# Patient Record
Sex: Female | Born: 1940
Health system: Southern US, Community
[De-identification: ages and names within clinical notes are randomized; demographics above are authoritative.]

## PROBLEM LIST (undated history)

## (undated) DIAGNOSIS — E119 Type 2 diabetes mellitus without complications: Secondary | ICD-10-CM

## (undated) DIAGNOSIS — I10 Essential (primary) hypertension: Secondary | ICD-10-CM

## (undated) DIAGNOSIS — R32 Unspecified urinary incontinence: Secondary | ICD-10-CM

## (undated) DIAGNOSIS — D649 Anemia, unspecified: Secondary | ICD-10-CM

## (undated) DIAGNOSIS — L102 Pemphigus foliaceous: Secondary | ICD-10-CM

## (undated) DIAGNOSIS — E785 Hyperlipidemia, unspecified: Secondary | ICD-10-CM

## (undated) DIAGNOSIS — N289 Disorder of kidney and ureter, unspecified: Secondary | ICD-10-CM

## (undated) HISTORY — DX: Disorder of kidney and ureter, unspecified: N28.9

## (undated) HISTORY — DX: Hyperlipidemia, unspecified: E78.5

## (undated) HISTORY — DX: Unspecified urinary incontinence: R32

## (undated) HISTORY — DX: Pemphigus foliaceous: L10.2

## (undated) HISTORY — DX: Anemia, unspecified: D64.9

## (undated) HISTORY — DX: Essential (primary) hypertension: I10

## (undated) HISTORY — DX: Type 2 diabetes mellitus without complications: E11.9

---

## 2005-03-27 ENCOUNTER — Encounter: Admission: RE | Admit: 2005-03-27 | Discharge: 2005-03-27 | Payer: Self-pay | Admitting: Internal Medicine

## 2005-04-03 ENCOUNTER — Encounter: Admission: RE | Admit: 2005-04-03 | Discharge: 2005-07-02 | Payer: Self-pay | Admitting: Internal Medicine

## 2005-05-09 ENCOUNTER — Encounter: Admission: RE | Admit: 2005-05-09 | Discharge: 2005-05-09 | Payer: Self-pay | Admitting: Internal Medicine

## 2005-05-13 ENCOUNTER — Encounter: Admission: RE | Admit: 2005-05-13 | Discharge: 2005-05-13 | Payer: Self-pay | Admitting: Internal Medicine

## 2005-07-03 ENCOUNTER — Encounter: Admission: RE | Admit: 2005-07-03 | Discharge: 2005-07-03 | Payer: Self-pay | Admitting: Neurology

## 2006-07-30 ENCOUNTER — Other Ambulatory Visit: Admission: RE | Admit: 2006-07-30 | Discharge: 2006-07-30 | Payer: Self-pay | Admitting: Internal Medicine

## 2007-07-08 ENCOUNTER — Ambulatory Visit (HOSPITAL_COMMUNITY): Admission: RE | Admit: 2007-07-08 | Discharge: 2007-07-08 | Payer: Self-pay | Admitting: Internal Medicine

## 2007-07-08 ENCOUNTER — Ambulatory Visit: Payer: Self-pay | Admitting: Vascular Surgery

## 2007-08-13 ENCOUNTER — Other Ambulatory Visit: Admission: RE | Admit: 2007-08-13 | Discharge: 2007-08-13 | Payer: Self-pay | Admitting: Internal Medicine

## 2009-01-14 ENCOUNTER — Ambulatory Visit: Payer: Self-pay | Admitting: Internal Medicine

## 2009-02-09 ENCOUNTER — Ambulatory Visit: Payer: Self-pay | Admitting: Internal Medicine

## 2009-03-25 ENCOUNTER — Ambulatory Visit: Payer: Self-pay | Admitting: Internal Medicine

## 2009-04-02 ENCOUNTER — Ambulatory Visit: Payer: Self-pay | Admitting: Internal Medicine

## 2009-04-08 ENCOUNTER — Ambulatory Visit: Payer: Self-pay | Admitting: Internal Medicine

## 2009-04-27 ENCOUNTER — Ambulatory Visit: Payer: Self-pay | Admitting: Internal Medicine

## 2009-05-19 ENCOUNTER — Ambulatory Visit: Payer: Self-pay | Admitting: Internal Medicine

## 2009-05-25 ENCOUNTER — Ambulatory Visit: Payer: Self-pay | Admitting: Internal Medicine

## 2009-05-28 ENCOUNTER — Ambulatory Visit: Payer: Self-pay | Admitting: Endocrinology

## 2009-05-28 DIAGNOSIS — E7800 Pure hypercholesterolemia, unspecified: Secondary | ICD-10-CM | POA: Insufficient documentation

## 2009-05-28 DIAGNOSIS — E119 Type 2 diabetes mellitus without complications: Secondary | ICD-10-CM | POA: Insufficient documentation

## 2009-05-28 DIAGNOSIS — I10 Essential (primary) hypertension: Secondary | ICD-10-CM | POA: Insufficient documentation

## 2009-06-03 ENCOUNTER — Encounter: Payer: Self-pay | Admitting: Endocrinology

## 2009-06-10 ENCOUNTER — Ambulatory Visit: Payer: Self-pay | Admitting: Endocrinology

## 2009-06-25 ENCOUNTER — Ambulatory Visit: Payer: Self-pay | Admitting: Endocrinology

## 2009-06-25 DIAGNOSIS — L109 Pemphigus, unspecified: Secondary | ICD-10-CM | POA: Insufficient documentation

## 2009-06-25 DIAGNOSIS — I6789 Other cerebrovascular disease: Secondary | ICD-10-CM | POA: Insufficient documentation

## 2009-06-28 LAB — CONVERTED CEMR LAB: Fructosamine: 235 micromoles/L (ref <285)

## 2009-07-23 ENCOUNTER — Inpatient Hospital Stay (HOSPITAL_COMMUNITY): Admission: EM | Admit: 2009-07-23 | Discharge: 2009-07-29 | Payer: Self-pay | Admitting: Specialist

## 2009-08-10 ENCOUNTER — Inpatient Hospital Stay (HOSPITAL_COMMUNITY): Admission: EM | Admit: 2009-08-10 | Discharge: 2009-08-17 | Payer: Self-pay | Admitting: Emergency Medicine

## 2009-08-13 ENCOUNTER — Ambulatory Visit: Payer: Self-pay | Admitting: Internal Medicine

## 2009-08-16 ENCOUNTER — Encounter: Payer: Self-pay | Admitting: Gastroenterology

## 2009-08-16 ENCOUNTER — Encounter (INDEPENDENT_AMBULATORY_CARE_PROVIDER_SITE_OTHER): Payer: Self-pay | Admitting: Internal Medicine

## 2009-09-02 ENCOUNTER — Inpatient Hospital Stay (HOSPITAL_COMMUNITY): Admission: EM | Admit: 2009-09-02 | Discharge: 2009-09-14 | Payer: Self-pay | Admitting: Emergency Medicine

## 2009-09-08 ENCOUNTER — Encounter (INDEPENDENT_AMBULATORY_CARE_PROVIDER_SITE_OTHER): Payer: Self-pay | Admitting: Internal Medicine

## 2009-09-08 ENCOUNTER — Ambulatory Visit: Payer: Self-pay | Admitting: Cardiology

## 2009-11-18 ENCOUNTER — Ambulatory Visit: Payer: Self-pay | Admitting: Internal Medicine

## 2009-11-26 ENCOUNTER — Ambulatory Visit: Payer: Self-pay | Admitting: Internal Medicine

## 2009-12-06 ENCOUNTER — Ambulatory Visit: Payer: Self-pay | Admitting: Internal Medicine

## 2009-12-09 ENCOUNTER — Ambulatory Visit: Payer: Self-pay | Admitting: Internal Medicine

## 2010-01-03 ENCOUNTER — Ambulatory Visit: Payer: Self-pay | Admitting: Internal Medicine

## 2010-01-31 ENCOUNTER — Ambulatory Visit: Payer: Self-pay | Admitting: Internal Medicine

## 2010-02-11 ENCOUNTER — Ambulatory Visit: Payer: Self-pay | Admitting: Internal Medicine

## 2010-02-14 ENCOUNTER — Ambulatory Visit: Payer: Self-pay | Admitting: Internal Medicine

## 2010-02-22 ENCOUNTER — Ambulatory Visit: Payer: Self-pay | Admitting: Internal Medicine

## 2010-03-08 ENCOUNTER — Ambulatory Visit: Payer: Self-pay | Admitting: Internal Medicine

## 2010-03-14 ENCOUNTER — Encounter: Admission: RE | Admit: 2010-03-14 | Discharge: 2010-06-12 | Payer: Self-pay | Admitting: Internal Medicine

## 2010-04-01 ENCOUNTER — Ambulatory Visit: Payer: Self-pay | Admitting: Internal Medicine

## 2010-04-04 ENCOUNTER — Ambulatory Visit: Payer: Self-pay | Admitting: Internal Medicine

## 2010-04-15 ENCOUNTER — Ambulatory Visit: Payer: Self-pay | Admitting: Internal Medicine

## 2010-04-29 ENCOUNTER — Ambulatory Visit: Payer: Self-pay | Admitting: Internal Medicine

## 2010-05-02 ENCOUNTER — Ambulatory Visit: Payer: Self-pay | Admitting: Internal Medicine

## 2010-05-03 ENCOUNTER — Ambulatory Visit: Payer: Self-pay | Admitting: Internal Medicine

## 2010-05-20 ENCOUNTER — Ambulatory Visit: Payer: Self-pay | Admitting: Internal Medicine

## 2010-05-27 ENCOUNTER — Ambulatory Visit: Payer: Self-pay | Admitting: Internal Medicine

## 2010-06-01 ENCOUNTER — Ambulatory Visit: Payer: Self-pay | Admitting: Internal Medicine

## 2010-06-07 ENCOUNTER — Encounter (HOSPITAL_BASED_OUTPATIENT_CLINIC_OR_DEPARTMENT_OTHER)
Admission: RE | Admit: 2010-06-07 | Discharge: 2010-09-05 | Payer: Self-pay | Source: Home / Self Care | Attending: General Surgery | Admitting: General Surgery

## 2010-06-09 ENCOUNTER — Ambulatory Visit: Payer: Self-pay | Admitting: Internal Medicine

## 2010-06-13 ENCOUNTER — Encounter: Admission: RE | Admit: 2010-06-13 | Discharge: 2010-07-11 | Payer: Self-pay | Admitting: Internal Medicine

## 2010-06-14 ENCOUNTER — Ambulatory Visit: Payer: Self-pay | Admitting: Internal Medicine

## 2010-07-01 ENCOUNTER — Ambulatory Visit: Payer: Self-pay | Admitting: Vascular Surgery

## 2010-07-19 ENCOUNTER — Ambulatory Visit: Payer: Self-pay | Admitting: Internal Medicine

## 2010-09-05 ENCOUNTER — Encounter (HOSPITAL_BASED_OUTPATIENT_CLINIC_OR_DEPARTMENT_OTHER)
Admission: RE | Admit: 2010-09-05 | Discharge: 2010-10-04 | Payer: Self-pay | Source: Home / Self Care | Attending: General Surgery | Admitting: General Surgery

## 2010-09-15 ENCOUNTER — Ambulatory Visit: Payer: Self-pay | Admitting: Internal Medicine

## 2010-10-18 NOTE — Assessment & Plan Note (Signed)
Summary: 2 WK FU  #  STC--PER PT RS MOM/CONFLICT--STC   Vital Signs:  Patient profile:   70 year old female Height:      62 inches (157.48 cm) Weight:      258.50 pounds (117.50 kg) O2 Sat:      96 % on Room air Temp:     97.9 degrees F (36.61 degrees C) oral Pulse rate:   123 / minute BP sitting:   120 / 86  (left arm) Cuff size:   large  Vitals Entered By: Josph Macho CMA (June 25, 2009 10:04 AM)  O2 Flow:  Room air CC: 2 week follow up/ Pt states her doctor at [facility] changed her Prednisone to 2 1/2 tabs on Oct. 2, 2010/ CF   Referring Provider:  Dr Marlan Palau Primary Provider:  Dr Marlan Palau  CC:  2 week follow up/ Pt states her doctor at [facility] changed her Prednisone to 2 1/2 tabs on Oct. 2 and 2010/ CF.  History of Present Illness: last week, prednisone was reduced to 50 mg qam.  no cbg record, but states cbg's are  well-controlled, except it is highest at lunch.  pt states she feels well in general.   Current Medications (verified): 1)  Amlodipine Besylate 5 Mg Tabs (Amlodipine Besylate) .Marland Kitchen.. 1 Tab Qam 2)  Detrol La 4 Mg Xr24h-Cap (Tolterodine Tartrate) .Marland Kitchen.. 1 Cap Qam 3)  Furosemide 40 Mg Tabs (Furosemide) .Marland Kitchen.. 1 Tab Two Times A Day 4)  Aggrenox 25-200 Mg Xr12h-Cap (Aspirin-Dipyridamole) .Marland Kitchen.. 1 Cap Two Times A Day 5)  Aspirin 81 Mg Tbec (Aspirin) .Marland Kitchen.. 1qd 6)  Potassium Gluconate 595 Mg Tabs (Potassium Gluconate) .Marland Kitchen.. 1 Tab Tues,thurs and Sat 7)  Simvastatin 20 Mg Tabs (Simvastatin) .Marland Kitchen.. 1 Tab Daily 8)  Humalog Pen 100 Unit/ml Soln (Insulin Lispro (Human)) .... Qid (Qac) 20-30-15-5 Units 9)  Prednisone 20 Mg Tabs (Prednisone) .... 3 Tabs Qam 10)  Cellcept 500 Mg Tabs (Mycophenolate Mofetil) .... 4 Times Daily 11)  Vitamin D 2000 Unit Tabs (Cholecalciferol)  Allergies (verified): No Known Drug Allergies  Past History:  Past Medical History: OTHER URINARY INCONTINENCE (ICD-788.39) CEREBROVASCULAR ACCIDENT, ACUTE (ICD-436) PEMPHIGUS FOLIACEOUS  (ICD-694.4) HYPERTENSION (ICD-401.9) HYPERCHOLESTEROLEMIA (ICD-272.0) IDDM (ICD-250.01)  Review of Systems  The patient denies hypoglycemia.    Physical Exam  General:  obese.  no distress  Msk:  gait is slow but steady with a quad-cane Additional Exam:  fructosamine=235 (converts to a1c of 5.6)   Impression & Recommendations:  Problem # 1:  IDDM (ICD-250.01) overcontrolled  Medications Added to Medication List This Visit: 1)  Prednisone 20 Mg Tabs (Prednisone) .... 2 1/2  tabs qam  Other Orders: T-Fructosamine ([id number]) Est. Patient Level III (47425)  Patient Instructions: 1)  continue humalog to (just before each meal)  20-30-15-5 2)  return 2 months. 3)  tests are being ordered for you today.  a few days after the test(s), please call [phone] to hear your test results. 4)  update: take humalog only 20-25-10-5

## 2010-11-14 ENCOUNTER — Emergency Department (HOSPITAL_COMMUNITY): Payer: Medicare Other

## 2010-11-14 ENCOUNTER — Inpatient Hospital Stay (HOSPITAL_COMMUNITY)
Admission: EM | Admit: 2010-11-14 | Discharge: 2010-11-23 | DRG: 871 | Disposition: A | Discharge status: Other Acute Inpatient Hospital | Payer: Medicare Other | Attending: Internal Medicine | Admitting: Internal Medicine

## 2010-11-14 DIAGNOSIS — R6520 Severe sepsis without septic shock: Secondary | ICD-10-CM | POA: Diagnosis present

## 2010-11-14 DIAGNOSIS — E119 Type 2 diabetes mellitus without complications: Secondary | ICD-10-CM | POA: Diagnosis present

## 2010-11-14 DIAGNOSIS — Z8673 Personal history of transient ischemic attack (TIA), and cerebral infarction without residual deficits: Secondary | ICD-10-CM

## 2010-11-14 DIAGNOSIS — K219 Gastro-esophageal reflux disease without esophagitis: Secondary | ICD-10-CM | POA: Diagnosis present

## 2010-11-14 DIAGNOSIS — D631 Anemia in chronic kidney disease: Secondary | ICD-10-CM | POA: Diagnosis present

## 2010-11-14 DIAGNOSIS — R197 Diarrhea, unspecified: Secondary | ICD-10-CM | POA: Diagnosis present

## 2010-11-14 DIAGNOSIS — E876 Hypokalemia: Secondary | ICD-10-CM | POA: Diagnosis present

## 2010-11-14 DIAGNOSIS — R6521 Severe sepsis with septic shock: Secondary | ICD-10-CM | POA: Diagnosis present

## 2010-11-14 DIAGNOSIS — A419 Sepsis, unspecified organism: Principal | ICD-10-CM | POA: Diagnosis present

## 2010-11-14 DIAGNOSIS — L109 Pemphigus, unspecified: Secondary | ICD-10-CM | POA: Diagnosis present

## 2010-11-14 DIAGNOSIS — N179 Acute kidney failure, unspecified: Secondary | ICD-10-CM

## 2010-11-14 DIAGNOSIS — N390 Urinary tract infection, site not specified: Secondary | ICD-10-CM | POA: Diagnosis present

## 2010-11-14 DIAGNOSIS — R4182 Altered mental status, unspecified: Secondary | ICD-10-CM | POA: Diagnosis present

## 2010-11-14 DIAGNOSIS — I129 Hypertensive chronic kidney disease with stage 1 through stage 4 chronic kidney disease, or unspecified chronic kidney disease: Secondary | ICD-10-CM | POA: Diagnosis present

## 2010-11-14 DIAGNOSIS — N183 Chronic kidney disease, stage 3 unspecified: Secondary | ICD-10-CM | POA: Diagnosis present

## 2010-11-14 LAB — CBC
Hemoglobin: 13.8 g/dL (ref 12.0–15.0)
Platelets: 116 10*3/uL — ABNORMAL LOW (ref 150–400)
RBC: 4.47 MIL/uL (ref 3.87–5.11)
RDW: 14.3 % (ref 11.5–15.5)

## 2010-11-14 LAB — COMPREHENSIVE METABOLIC PANEL
ALT: 34 U/L (ref 0–35)
AST: 100 U/L — ABNORMAL HIGH (ref 0–37)
Albumin: 2 g/dL — ABNORMAL LOW (ref 3.5–5.2)
Alkaline Phosphatase: 53 U/L (ref 39–117)
CO2: 22 mEq/L (ref 19–32)
Calcium: 8 mg/dL — ABNORMAL LOW (ref 8.4–10.5)
Chloride: 100 mEq/L (ref 96–112)
Creatinine, Ser: 1.74 mg/dL — ABNORMAL HIGH (ref 0.4–1.2)
GFR calc Af Amer: 35 mL/min — ABNORMAL LOW (ref >60)
GFR calc non Af Amer: 29 mL/min — ABNORMAL LOW (ref >60)
Glucose, Bld: 235 mg/dL — ABNORMAL HIGH (ref 70–99)
Potassium: 3.7 mEq/L (ref 3.5–5.1)
Sodium: 133 mEq/L — ABNORMAL LOW (ref 135–145)
Total Bilirubin: 1.2 mg/dL (ref 0.3–1.2)
Total Protein: 4.9 g/dL — ABNORMAL LOW (ref 6.0–8.3)

## 2010-11-14 LAB — URINALYSIS, ROUTINE W REFLEX MICROSCOPIC
Nitrite: NEGATIVE
Protein, ur: 100 mg/dL — AB
Urobilinogen, UA: 1 mg/dL (ref 0.0–1.0)

## 2010-11-14 LAB — LACTIC ACID, PLASMA
Lactic Acid, Venous: 4.5 mmol/L — ABNORMAL HIGH (ref 0.5–2.2)
Lactic Acid, Venous: 3.1 mmol/L — ABNORMAL HIGH (ref 0.5–2.2)

## 2010-11-14 LAB — DIFFERENTIAL
Basophils Absolute: 0 10*3/uL (ref 0.0–0.1)
Basophils Relative: 0 % (ref 0–1)
Eosinophils Absolute: 0.1 10*3/uL (ref 0.0–0.7)
Eosinophils Relative: 1 % (ref 0–5)
Lymphocytes Relative: 2 % — ABNORMAL LOW (ref 12–46)
Lymphs Abs: 0.1 10*3/uL — ABNORMAL LOW (ref 0.7–4.0)
Monocytes Absolute: 0.1 10*3/uL (ref 0.1–1.0)
Monocytes Relative: 2 % — ABNORMAL LOW (ref 3–12)
Neutrophils Relative %: 95 % — ABNORMAL HIGH (ref 43–77)
WBC Morphology: INCREASED

## 2010-11-14 LAB — POCT CARDIAC MARKERS
CKMB, poc: <1 ng/mL — ABNORMAL LOW (ref 1.0–8.0)
Troponin i, poc: <0.05 ng/mL (ref 0.00–0.09)

## 2010-11-14 LAB — MRSA PCR SCREENING: MRSA by PCR: POSITIVE — AB

## 2010-11-14 LAB — URINE MICROSCOPIC-ADD ON

## 2010-11-14 LAB — GLUCOSE, CAPILLARY: Glucose-Capillary: 156 mg/dL — ABNORMAL HIGH (ref 70–99)

## 2010-11-15 ENCOUNTER — Inpatient Hospital Stay (HOSPITAL_COMMUNITY): Payer: Medicare Other

## 2010-11-15 DIAGNOSIS — L109 Pemphigus, unspecified: Secondary | ICD-10-CM

## 2010-11-15 DIAGNOSIS — R6521 Severe sepsis with septic shock: Secondary | ICD-10-CM

## 2010-11-15 DIAGNOSIS — A4901 Methicillin susceptible Staphylococcus aureus infection, unspecified site: Secondary | ICD-10-CM

## 2010-11-15 DIAGNOSIS — R7881 Bacteremia: Secondary | ICD-10-CM

## 2010-11-15 DIAGNOSIS — A419 Sepsis, unspecified organism: Secondary | ICD-10-CM

## 2010-11-15 DIAGNOSIS — N179 Acute kidney failure, unspecified: Secondary | ICD-10-CM

## 2010-11-15 LAB — GLUCOSE, CAPILLARY
Glucose-Capillary: 152 mg/dL — ABNORMAL HIGH (ref 70–99)
Glucose-Capillary: 183 mg/dL — ABNORMAL HIGH (ref 70–99)

## 2010-11-15 LAB — CBC
HCT: 34.8 % — ABNORMAL LOW (ref 36.0–46.0)
Hemoglobin: 11 g/dL — ABNORMAL LOW (ref 12.0–15.0)
MCH: 29.9 pg (ref 26.0–34.0)
MCV: 94.6 fL (ref 78.0–100.0)
RBC: 3.68 MIL/uL — ABNORMAL LOW (ref 3.87–5.11)

## 2010-11-15 LAB — BASIC METABOLIC PANEL
CO2: 18 mEq/L — ABNORMAL LOW (ref 19–32)
Chloride: 114 mEq/L — ABNORMAL HIGH (ref 96–112)
Creatinine, Ser: 1.62 mg/dL — ABNORMAL HIGH (ref 0.4–1.2)
GFR calc Af Amer: 38 mL/min — ABNORMAL LOW (ref >60)
Glucose, Bld: 180 mg/dL — ABNORMAL HIGH (ref 70–99)

## 2010-11-15 LAB — URINE CULTURE: Culture  Setup Time: 201202271526

## 2010-11-16 LAB — BASIC METABOLIC PANEL
BUN: 40 mg/dL — ABNORMAL HIGH (ref 6–23)
Calcium: 7.9 mg/dL — ABNORMAL LOW (ref 8.4–10.5)
Creatinine, Ser: 1.27 mg/dL — ABNORMAL HIGH (ref 0.4–1.2)
GFR calc non Af Amer: 42 mL/min — ABNORMAL LOW (ref >60)
Glucose, Bld: 237 mg/dL — ABNORMAL HIGH (ref 70–99)

## 2010-11-16 LAB — GLUCOSE, CAPILLARY
Glucose-Capillary: 221 mg/dL — ABNORMAL HIGH (ref 70–99)
Glucose-Capillary: 245 mg/dL — ABNORMAL HIGH (ref 70–99)

## 2010-11-16 LAB — CBC
HCT: 33.8 % — ABNORMAL LOW (ref 36.0–46.0)
MCHC: 32 g/dL (ref 30.0–36.0)
MCV: 93.4 fL (ref 78.0–100.0)
RDW: 14.6 % (ref 11.5–15.5)

## 2010-11-17 LAB — BASIC METABOLIC PANEL
BUN: 38 mg/dL — ABNORMAL HIGH (ref 6–23)
CO2: 19 mEq/L (ref 19–32)
Calcium: 8.3 mg/dL — ABNORMAL LOW (ref 8.4–10.5)
Chloride: 120 mEq/L — ABNORMAL HIGH (ref 96–112)
Creatinine, Ser: 1.2 mg/dL (ref 0.4–1.2)

## 2010-11-17 LAB — GLUCOSE, CAPILLARY
Glucose-Capillary: 262 mg/dL — ABNORMAL HIGH (ref 70–99)
Glucose-Capillary: 303 mg/dL — ABNORMAL HIGH (ref 70–99)

## 2010-11-17 LAB — CBC
MCH: 29.7 pg (ref 26.0–34.0)
MCHC: 31.8 g/dL (ref 30.0–36.0)
MCV: 93.6 fL (ref 78.0–100.0)
Platelets: 95 10*3/uL — ABNORMAL LOW (ref 150–400)
RBC: 3.43 MIL/uL — ABNORMAL LOW (ref 3.87–5.11)

## 2010-11-17 LAB — CULTURE, BLOOD (ROUTINE X 2): Culture  Setup Time: 201202271400

## 2010-11-18 LAB — CULTURE, BLOOD (ROUTINE X 2): Culture  Setup Time: 201202271400

## 2010-11-18 LAB — COMPREHENSIVE METABOLIC PANEL
Alkaline Phosphatase: 48 U/L (ref 39–117)
BUN: 35 mg/dL — ABNORMAL HIGH (ref 6–23)
Calcium: 8.5 mg/dL (ref 8.4–10.5)
Creatinine, Ser: 1.1 mg/dL (ref 0.4–1.2)
Glucose, Bld: 257 mg/dL — ABNORMAL HIGH (ref 70–99)
Total Protein: 4.2 g/dL — ABNORMAL LOW (ref 6.0–8.3)

## 2010-11-18 LAB — GLUCOSE, CAPILLARY
Glucose-Capillary: 208 mg/dL — ABNORMAL HIGH (ref 70–99)
Glucose-Capillary: 254 mg/dL — ABNORMAL HIGH (ref 70–99)
Glucose-Capillary: 261 mg/dL — ABNORMAL HIGH (ref 70–99)

## 2010-11-18 LAB — STOOL CULTURE

## 2010-11-18 LAB — CBC
MCH: 30.3 pg (ref 26.0–34.0)
MCV: 93.2 fL (ref 78.0–100.0)
Platelets: 118 10*3/uL — ABNORMAL LOW (ref 150–400)
RDW: 15.3 % (ref 11.5–15.5)

## 2010-11-19 LAB — BASIC METABOLIC PANEL
CO2: 20 mEq/L (ref 19–32)
Calcium: 8.3 mg/dL — ABNORMAL LOW (ref 8.4–10.5)
GFR calc Af Amer: >60 mL/min (ref >60)
GFR calc non Af Amer: 50 mL/min — ABNORMAL LOW (ref >60)
Sodium: 144 mEq/L (ref 135–145)

## 2010-11-19 LAB — GLUCOSE, CAPILLARY
Glucose-Capillary: 177 mg/dL — ABNORMAL HIGH (ref 70–99)
Glucose-Capillary: 240 mg/dL — ABNORMAL HIGH (ref 70–99)
Glucose-Capillary: 172 mg/dL — ABNORMAL HIGH (ref 70–99)

## 2010-11-19 LAB — CBC
Hemoglobin: 10.9 g/dL — ABNORMAL LOW (ref 12.0–15.0)
MCHC: 32.2 g/dL (ref 30.0–36.0)

## 2010-11-20 ENCOUNTER — Inpatient Hospital Stay (HOSPITAL_COMMUNITY): Payer: Medicare Other

## 2010-11-20 LAB — GLUCOSE, CAPILLARY
Glucose-Capillary: 101 mg/dL — ABNORMAL HIGH (ref 70–99)
Glucose-Capillary: 110 mg/dL — ABNORMAL HIGH (ref 70–99)
Glucose-Capillary: 218 mg/dL — ABNORMAL HIGH (ref 70–99)
Glucose-Capillary: 307 mg/dL — ABNORMAL HIGH (ref 70–99)

## 2010-11-21 DIAGNOSIS — L109 Pemphigus, unspecified: Secondary | ICD-10-CM

## 2010-11-21 DIAGNOSIS — R5381 Other malaise: Secondary | ICD-10-CM

## 2010-11-21 LAB — BASIC METABOLIC PANEL
Chloride: 114 mEq/L — ABNORMAL HIGH (ref 96–112)
GFR calc Af Amer: >60 mL/min (ref >60)
Potassium: 4.1 mEq/L (ref 3.5–5.1)
Sodium: 142 mEq/L (ref 135–145)

## 2010-11-21 LAB — GLUCOSE, CAPILLARY
Glucose-Capillary: 192 mg/dL — ABNORMAL HIGH (ref 70–99)
Glucose-Capillary: 201 mg/dL — ABNORMAL HIGH (ref 70–99)
Glucose-Capillary: 228 mg/dL — ABNORMAL HIGH (ref 70–99)

## 2010-11-22 DIAGNOSIS — A4901 Methicillin susceptible Staphylococcus aureus infection, unspecified site: Secondary | ICD-10-CM

## 2010-11-22 DIAGNOSIS — R7881 Bacteremia: Secondary | ICD-10-CM

## 2010-11-22 LAB — GLUCOSE, CAPILLARY
Glucose-Capillary: 133 mg/dL — ABNORMAL HIGH (ref 70–99)
Glucose-Capillary: 200 mg/dL — ABNORMAL HIGH (ref 70–99)
Glucose-Capillary: 220 mg/dL — ABNORMAL HIGH (ref 70–99)

## 2010-11-23 ENCOUNTER — Inpatient Hospital Stay
Admission: AD | Admit: 2010-11-23 | Discharge: 2010-12-15 | Disposition: A | Discharge status: Home Health | Payer: Self-pay | Source: Ambulatory Visit | Attending: Internal Medicine | Admitting: Internal Medicine

## 2010-11-23 LAB — CBC
MCV: 94.1 fL (ref 78.0–100.0)
Platelets: 263 10*3/uL (ref 150–400)
RDW: 15.6 % — ABNORMAL HIGH (ref 11.5–15.5)
WBC: 10.9 10*3/uL — ABNORMAL HIGH (ref 4.0–10.5)

## 2010-11-23 LAB — BASIC METABOLIC PANEL
BUN: 20 mg/dL (ref 6–23)
Creatinine, Ser: 0.78 mg/dL (ref 0.4–1.2)
GFR calc non Af Amer: >60 mL/min (ref >60)
Potassium: 4.7 mEq/L (ref 3.5–5.1)

## 2010-11-23 LAB — GLUCOSE, CAPILLARY
Glucose-Capillary: 100 mg/dL — ABNORMAL HIGH (ref 70–99)
Glucose-Capillary: 161 mg/dL — ABNORMAL HIGH (ref 70–99)
Glucose-Capillary: 231 mg/dL — ABNORMAL HIGH (ref 70–99)

## 2010-11-23 LAB — HEMOGLOBIN A1C: Mean Plasma Glucose: 214 mg/dL — ABNORMAL HIGH (ref <117)

## 2010-11-24 LAB — URINALYSIS, ROUTINE W REFLEX MICROSCOPIC
Bilirubin Urine: NEGATIVE
Glucose, UA: NEGATIVE mg/dL
Hgb urine dipstick: NEGATIVE
Protein, ur: NEGATIVE mg/dL
Specific Gravity, Urine: 1.023 (ref 1.005–1.030)
Urobilinogen, UA: 0.2 mg/dL (ref 0.0–1.0)

## 2010-11-24 LAB — COMPREHENSIVE METABOLIC PANEL
ALT: 17 U/L (ref 0–35)
Alkaline Phosphatase: 73 U/L (ref 39–117)
CO2: 24 mEq/L (ref 19–32)
GFR calc non Af Amer: >60 mL/min (ref >60)
Glucose, Bld: 166 mg/dL — ABNORMAL HIGH (ref 70–99)
Potassium: 4.1 mEq/L (ref 3.5–5.1)
Sodium: 137 mEq/L (ref 135–145)

## 2010-11-24 LAB — CBC
HCT: 32.6 % — ABNORMAL LOW (ref 36.0–46.0)
Hemoglobin: 10.5 g/dL — ABNORMAL LOW (ref 12.0–15.0)
RBC: 3.53 MIL/uL — ABNORMAL LOW (ref 3.87–5.11)
WBC: 11.5 10*3/uL — ABNORMAL HIGH (ref 4.0–10.5)

## 2010-11-24 LAB — MAGNESIUM: Magnesium: 1.8 mg/dL (ref 1.5–2.5)

## 2010-11-24 LAB — DIFFERENTIAL
Basophils Absolute: 0 10*3/uL (ref 0.0–0.1)
Lymphocytes Relative: 18 % (ref 12–46)
Neutro Abs: 8.2 10*3/uL — ABNORMAL HIGH (ref 1.7–7.7)

## 2010-11-24 LAB — TSH: TSH: 2.231 u[IU]/mL (ref 0.350–4.500)

## 2010-11-24 NOTE — Discharge Summary (Signed)
  NAME:  Julie Mora, Julie Mora               ACCOUNT NO.:  [id number]  MEDICAL RECORD NO.:  [id number]           PATIENT TYPE:  I  LOCATION:  1321                         FACILITY:  [facility]  PHYSICIAN:  Baltazar Najjar, MD     DATE OF BIRTH:  1941/07/11  DATE OF ADMISSION:  11/14/2010 DATE OF DISCHARGE:                              DISCHARGE SUMMARY   Addendum  Please refer to the dictated summary dated November 22, 2010 by Dr. Hillery Aldo.  ADDENDUM TO HOSPITAL COURSE:  The patient was seen and examined by me today and she is feeling better.  Denies any complaints.  Chart review and vital signs stable.  Blood pressure 135/87, pulse 79, temperature 97.7.  LABORATORY DATA:  On discharge, potassium 4.7, creatinine 0.7, hemoglobin 10.6, and WBCs 10.9.  DISCHARGE MEDICATIONS: 1. Florinef 0.1 mg tablets, take 2 tablets p.o. b.i.d. 2. Prednisone 40 mg p.o. daily. 3. TobraDex ophthalmic, both eyes, 1 application b.i.d. 4. Pradaxa 75 mg p.o. b.i.d. 5. Flora-Q intestinal flora capsules, 1 capsule p.o. daily. 6. Lomotil 1-2 tablets q.6h. p.r.n., maximum 60 mg per day. 7. Cefazolin 1 g IV q.8h. for 21 days to complete 28 days of therapy. 8. Avelox 400 mg IV q.24h. for 20 days to complete 28 days of therapy. 9. Lantus insulin 30 units subcu daily. 10.NovoLog insulin sliding scale, 1-15 units subcu t.i.d. with meals     and 2-5 units subcu q.h.s. 11.NovoLog insulin 9 units t.i.d. with meals. 12.Ferrous sulfate 325 mg 1 tablet p.o. b.i.d. 13.Silver sulfadiazine topical 1 application b.i.d. 14.Multivitamin 1 tablet p.o. daily. 15.Protonix 40 mg p.o. daily. 16.Vitamin C 500 mg 1 tablet p.o. 3 times a week. 17.Vitamin D2 1000 units p.o. 1 capsule p.o. daily.  MEDICATIONS ON HOLD: 1. Azathioprine 50 mg p.o. 2 tablets b.i.d. 2. Fluconazole 150 mg 1 tablet p.o. twice a week. 3. Lasix 80 mg p.o. daily. 4. Potassium chloride 10 mEq 5 tablets p.o. b.i.d.  DISCONTINUED MEDICATION:  Ibuprofen 200 mg 2  tablets p.o. q.8h. p.r.n.  FOLLOWUP: 1. The patient to follow with Dr. [name] in 1 week period and with Dr.     Janalyn Harder in 1 week period post discharge. 2. Immunosuppressive therapy is on hold as above.  Resumption of     therapy to be determined by her dermatologist.  She is currently on     her maintenance dose of steroid; however, her azathioprine is held. 3. The patient to report any worsening of symptoms or any new symptoms     to her PCP or to come back to the ED.  CONDITION ON DISCHARGE:  Stable.          ______________________________ Baltazar Najjar, MD     SA/MEDQ  D:  11/23/2010  T:  11/23/2010  Job:  [id number]  cc:   Luanna Cole. [name], M.D. Fax: [phone]  Ria Bush. [name], M.D. Fax: [phone]  Electronically Signed by Hannah Beat MD on 11/23/2010 09:41:18 PM

## 2010-11-25 LAB — URINE CULTURE: Culture  Setup Time: 201203081019

## 2010-11-28 LAB — BASIC METABOLIC PANEL
BUN: 20 mg/dL (ref 6–23)
Calcium: 8.1 mg/dL — ABNORMAL LOW (ref 8.4–10.5)
Creatinine, Ser: 0.85 mg/dL (ref 0.4–1.2)
GFR calc non Af Amer: >60 mL/min (ref >60)
Glucose, Bld: 107 mg/dL — ABNORMAL HIGH (ref 70–99)
Sodium: 143 mEq/L (ref 135–145)

## 2010-11-28 LAB — URINALYSIS, MICROSCOPIC ONLY
Bilirubin Urine: NEGATIVE
Glucose, UA: >1000 mg/dL — AB
Ketones, ur: NEGATIVE mg/dL
pH: 6 (ref 5.0–8.0)

## 2010-11-28 LAB — CBC
HCT: 33.8 % — ABNORMAL LOW (ref 36.0–46.0)
MCHC: 32 g/dL (ref 30.0–36.0)
MCV: 93.6 fL (ref 78.0–100.0)
RDW: 14.8 % (ref 11.5–15.5)

## 2010-11-29 LAB — BASIC METABOLIC PANEL
Calcium: 8.2 mg/dL — ABNORMAL LOW (ref 8.4–10.5)
GFR calc Af Amer: >60 mL/min (ref >60)
GFR calc non Af Amer: >60 mL/min (ref >60)
Glucose, Bld: 123 mg/dL — ABNORMAL HIGH (ref 70–99)
Sodium: 142 mEq/L (ref 135–145)

## 2010-11-30 LAB — BASIC METABOLIC PANEL
BUN: 19 mg/dL (ref 6–23)
Calcium: 8.5 mg/dL (ref 8.4–10.5)
GFR calc non Af Amer: >60 mL/min (ref >60)
Potassium: 3.4 mEq/L — ABNORMAL LOW (ref 3.5–5.1)
Sodium: 143 mEq/L (ref 135–145)

## 2010-11-30 NOTE — Progress Notes (Signed)
NAME:  Julie Mora, POLYAK               ACCOUNT NO.:  [id number]  MEDICAL RECORD NO.:  [id number]           PATIENT TYPE:  I  LOCATION:  1321                         FACILITY:  [facility]  PHYSICIAN:  Hillery Aldo, M.D.   DATE OF BIRTH:  21-Aug-1941                                PROGRESS NOTE   PRIMARY CARE PHYSICIAN: Luanna Cole. [name], M.D.  DERMATOLOGIST: Ria Bush. [name], M.D.  CURRENT DIAGNOSES: 1. Staphylococcus aureus and Proteus mirabilis bacteremia with sepsis. 2. Pemphigus foliaceus with acute flare. 3. Acute renal failure. 4. Stage II chronic kidney disease. 5. Hypokalemia. 6. Poorly controlled type 2 diabetes. 7. Diarrhea. 8. History of stroke. 9. Hypertension. 10.Gastroesophageal reflux disease. 11.Normocytic anemia. 12.Transient altered mental status. 13.Escherichia coli urinary tract infection.  DISCHARGE MEDICATIONS: Will be dictated at the time of actual discharge.  CONSULTATIONS: 1. Coralyn Helling, M.D., Pulmonology 2. Fransisco Hertz, M.D., Infectious Disease.  BRIEF ADMISSION HISTORY OF PRESENT ILLNESS: The patient is an unfortunate Julie Mora with past medical history of pemphigus foliaceus and skin breakdown from this disorder who was maintained on chronic immunosuppressive therapy.  She was brought to the hospital after developing fever accompanied by diarrhea and hypotension.  She was initially admitted by the pulmonary critical care specialist and her care subsequently transferred to the hospitalist when she was hemodynamically stable on November 16, 2010.  PROCEDURES AND DIAGNOSTIC STUDIES: 1. Chest x-ray on November 14, 2010, showed no acute cardiopulmonary     findings. 2. Chest x-ray on November 15, 2010, showed pulmonary vascular     congestion with no focal infiltrate or effusion. 3. Repeat chest x-ray on November 15, 2010, status post placement of     the PICC line showed the tip in the mid distal SVC approximately 4     cm above the  region of the caval atrial junction. 4. KUB on November 20, 2010, showed diffuse gaseous distention of large     and small bowel.  Possible mild ileus.  No substantial stool volume     in the colon or rectum to suggest clinical constipation or     impaction.  DISCHARGE LABORATORY VALUES: Will be dictated at the time of actual discharge.  HOSPITAL COURSE BY PROBLEM: 1. Sepsis secondary to staph aureus and Proteus mirabilis bacteremia:     The patient was admitted and fluid volume resuscitated.  She was     put on broad-spectrum antibiotics and antifungals due to her     history of chronic immunosuppression.  Her initial antibiotic     regimen consisted of vancomycin, Fortaz, Flagyl, and Diflucan.     Blood cultures, urine cultures, stool cultures, and PCR for C     difficile studies were sent.  She was maintained on IV fluids and     did not require pressor support.  On broad-spectrum antibiotic     therapy, the patient's clinical status improved dramatically.  She     was seen in consultation with infectious diseases and ultimately a     regimen of cefazolin and Avelox were recommended for a total     treatment course of 28 days.  The patient is now 7 days into     treatment and is doing well.  She is hemodynamically stable.  A     PICC line has been placed for prolonged antibiotic therapy. 2. Escherichia coli urinary tract infection:  The patient has been on     adequate treatment for this. 3. Pemphigus with acute flare:  The patient's azathioprine was placed     on held and she was initially put on stress-dose steroids.  Her     steroids have been tapered to her maintenance dose of prednisone 40     mg p.o. daily.  She should follow up with Dr. Janalyn Mora     postdischarge for further definitive treatment of her pemphigus. 4. Acute renal failure in the setting of stage II chronic kidney     disease:  The patient's creatinine was elevated due to her sepsis     syndrome.  Her  creatinine was 1.74 on initial presentation and is     now 0.88. 5. Hypokalemia:  The patient's potassium was repleted. 6. Type 2 diabetes:  The patient's type 2 diabetes is poorly     controlled.  We will monitor her CBG values closely and adjusted     her insulin as needed.  At this point, we will check hemoglobin A1c     to determine her overall glycemic control and continue to adjust     her insulin to achieve better control. 7. Diarrhea:  The patient's stool studies and C difficile toxin     studies are negative.  A KUB was done to rule out fecal impaction     and this was also negative.  We are now treating her     symptomatically with Lomotil as needed. 8. History of CVA:  The patient is maintained on Pradaxa for off label     use. 9. Hypertension:  The patient's blood pressure is currently well     controlled. 10.Gastroesophageal reflux disease:  The patient remains on proton     pump inhibitor therapy. 11.Anemia of chronic disease:  The patient does have a mild normocytic     anemia.  This is most likely due to chronic disease or acute     critical illness. 12.Altered mental status:  This was felt to be steroid induced or     possibly due to ICU psychosis.  The patient's mental status is back     to baseline.  DISPOSITION: The patient is awaiting bed availability at a long-term acute care facility for ongoing treatment.  A discharge summary addendum will be dictated when a bed is available.     Hillery Aldo, M.D.     CR/MEDQ  D:  11/22/2010  T:  11/23/2010  Job:  [id number]  cc:   Luanna Cole. [name], M.D. Fax: [phone]  Ria Bush. [name], M.D. Fax: [phone]  Electronically Signed by Hillery Aldo M.D. on 11/30/2010 04:58:08 PM

## 2010-12-01 LAB — BASIC METABOLIC PANEL
GFR calc Af Amer: >60 mL/min (ref >60)
GFR calc non Af Amer: >60 mL/min (ref >60)
Glucose, Bld: 84 mg/dL (ref 70–99)
Potassium: 3.4 mEq/L — ABNORMAL LOW (ref 3.5–5.1)
Sodium: 144 mEq/L (ref 135–145)

## 2010-12-01 LAB — CBC
HCT: 32.7 % — ABNORMAL LOW (ref 36.0–46.0)
RDW: 15.6 % — ABNORMAL HIGH (ref 11.5–15.5)
WBC: 7.4 10*3/uL (ref 4.0–10.5)

## 2010-12-02 LAB — BASIC METABOLIC PANEL
BUN: 17 mg/dL (ref 6–23)
Chloride: 110 mEq/L (ref 96–112)
GFR calc Af Amer: >60 mL/min (ref >60)
GFR calc non Af Amer: >60 mL/min (ref >60)
Potassium: 3.5 mEq/L (ref 3.5–5.1)
Sodium: 144 mEq/L (ref 135–145)

## 2010-12-03 LAB — BASIC METABOLIC PANEL
CO2: 31 mEq/L (ref 19–32)
Chloride: 106 mEq/L (ref 96–112)
GFR calc Af Amer: >60 mL/min (ref >60)
Potassium: 3.1 mEq/L — ABNORMAL LOW (ref 3.5–5.1)
Sodium: 141 mEq/L (ref 135–145)

## 2010-12-04 LAB — BASIC METABOLIC PANEL
CO2: 32 mEq/L (ref 19–32)
Chloride: 107 mEq/L (ref 96–112)
GFR calc Af Amer: >60 mL/min (ref >60)
Glucose, Bld: 115 mg/dL — ABNORMAL HIGH (ref 70–99)
Potassium: 3.1 mEq/L — ABNORMAL LOW (ref 3.5–5.1)
Sodium: 141 mEq/L (ref 135–145)

## 2010-12-05 LAB — CBC
HCT: 32 % — ABNORMAL LOW (ref 36.0–46.0)
Hemoglobin: 9.9 g/dL — ABNORMAL LOW (ref 12.0–15.0)
MCH: 29.6 pg (ref 26.0–34.0)
MCHC: 30.9 g/dL (ref 30.0–36.0)
RBC: 3.35 MIL/uL — ABNORMAL LOW (ref 3.87–5.11)

## 2010-12-05 LAB — BASIC METABOLIC PANEL
CO2: 30 mEq/L (ref 19–32)
Calcium: 8.3 mg/dL — ABNORMAL LOW (ref 8.4–10.5)
Chloride: 106 mEq/L (ref 96–112)
Creatinine, Ser: 0.79 mg/dL (ref 0.4–1.2)
GFR calc Af Amer: >60 mL/min (ref >60)
Glucose, Bld: 119 mg/dL — ABNORMAL HIGH (ref 70–99)

## 2010-12-06 LAB — BASIC METABOLIC PANEL
CO2: 30 mEq/L (ref 19–32)
Calcium: 8.5 mg/dL (ref 8.4–10.5)
Chloride: 105 mEq/L (ref 96–112)
GFR calc Af Amer: >60 mL/min (ref >60)
Glucose, Bld: 97 mg/dL (ref 70–99)
Sodium: 141 mEq/L (ref 135–145)

## 2010-12-07 LAB — CBC
Hemoglobin: 10 g/dL — ABNORMAL LOW (ref 12.0–15.0)
MCH: 31 pg (ref 26.0–34.0)
MCHC: 31.7 g/dL (ref 30.0–36.0)
MCV: 97.5 fL (ref 78.0–100.0)
RBC: 3.23 MIL/uL — ABNORMAL LOW (ref 3.87–5.11)

## 2010-12-07 LAB — BASIC METABOLIC PANEL
BUN: 17 mg/dL (ref 6–23)
CO2: 31 mEq/L (ref 19–32)
Calcium: 8.4 mg/dL (ref 8.4–10.5)
Chloride: 105 mEq/L (ref 96–112)
Creatinine, Ser: 1.04 mg/dL (ref 0.4–1.2)
Glucose, Bld: 121 mg/dL — ABNORMAL HIGH (ref 70–99)

## 2010-12-08 LAB — BASIC METABOLIC PANEL
BUN: 18 mg/dL (ref 6–23)
Calcium: 8.1 mg/dL — ABNORMAL LOW (ref 8.4–10.5)
GFR calc non Af Amer: >60 mL/min (ref >60)
Glucose, Bld: 111 mg/dL — ABNORMAL HIGH (ref 70–99)

## 2010-12-09 LAB — CBC
HCT: 29.7 % — ABNORMAL LOW (ref 36.0–46.0)
MCH: 30.7 pg (ref 26.0–34.0)
MCHC: 31.3 g/dL (ref 30.0–36.0)
RDW: 16.2 % — ABNORMAL HIGH (ref 11.5–15.5)

## 2010-12-09 LAB — BASIC METABOLIC PANEL
Calcium: 8.3 mg/dL — ABNORMAL LOW (ref 8.4–10.5)
Creatinine, Ser: 0.9 mg/dL (ref 0.4–1.2)
GFR calc Af Amer: >60 mL/min (ref >60)
GFR calc non Af Amer: >60 mL/min (ref >60)
Glucose, Bld: 105 mg/dL — ABNORMAL HIGH (ref 70–99)
Sodium: 141 mEq/L (ref 135–145)

## 2010-12-10 LAB — BASIC METABOLIC PANEL
BUN: 14 mg/dL (ref 6–23)
Calcium: 8.3 mg/dL — ABNORMAL LOW (ref 8.4–10.5)
GFR calc non Af Amer: >60 mL/min (ref >60)
Potassium: 3.3 mEq/L — ABNORMAL LOW (ref 3.5–5.1)

## 2010-12-11 LAB — URINALYSIS, ROUTINE W REFLEX MICROSCOPIC
Bilirubin Urine: NEGATIVE
Ketones, ur: NEGATIVE mg/dL
Nitrite: POSITIVE — AB
Protein, ur: 30 mg/dL — AB
Urobilinogen, UA: 0.2 mg/dL (ref 0.0–1.0)
pH: 8.5 — ABNORMAL HIGH (ref 5.0–8.0)

## 2010-12-11 LAB — URINE MICROSCOPIC-ADD ON

## 2010-12-12 LAB — POTASSIUM: Potassium: 3.2 mEq/L — ABNORMAL LOW (ref 3.5–5.1)

## 2010-12-12 LAB — PREALBUMIN: Prealbumin: 18 mg/dL (ref 17.0–34.0)

## 2010-12-13 LAB — BASIC METABOLIC PANEL
BUN: 13 mg/dL (ref 6–23)
CO2: 28 mEq/L (ref 19–32)
Chloride: 110 mEq/L (ref 96–112)
Glucose, Bld: 87 mg/dL (ref 70–99)
Potassium: 3.1 mEq/L — ABNORMAL LOW (ref 3.5–5.1)
Sodium: 143 mEq/L (ref 135–145)

## 2010-12-14 LAB — BASIC METABOLIC PANEL
BUN: 13 mg/dL (ref 6–23)
CO2: 29 mEq/L (ref 19–32)
Chloride: 109 mEq/L (ref 96–112)
Creatinine, Ser: 0.9 mg/dL (ref 0.4–1.2)
GFR calc Af Amer: >60 mL/min (ref >60)
Potassium: 3.5 mEq/L (ref 3.5–5.1)

## 2010-12-14 LAB — CBC
Hemoglobin: 9.4 g/dL — ABNORMAL LOW (ref 12.0–15.0)
MCH: 31.1 pg (ref 26.0–34.0)
MCV: 97 fL (ref 78.0–100.0)
Platelets: 262 10*3/uL (ref 150–400)
RBC: 3.02 MIL/uL — ABNORMAL LOW (ref 3.87–5.11)
WBC: 10 10*3/uL (ref 4.0–10.5)

## 2010-12-15 ENCOUNTER — Ambulatory Visit: Payer: Self-pay | Admitting: Internal Medicine

## 2010-12-15 ENCOUNTER — Ambulatory Visit: Payer: Medicare Other | Admitting: Internal Medicine

## 2010-12-15 LAB — BASIC METABOLIC PANEL
CO2: 28 mEq/L (ref 19–32)
Calcium: 8.5 mg/dL (ref 8.4–10.5)
Chloride: 106 mEq/L (ref 96–112)
GFR calc Af Amer: >60 mL/min (ref >60)
Glucose, Bld: 82 mg/dL (ref 70–99)
Sodium: 141 mEq/L (ref 135–145)

## 2010-12-16 ENCOUNTER — Telehealth: Payer: Self-pay | Admitting: Endocrinology

## 2010-12-16 LAB — URINE CULTURE: Culture  Setup Time: 201203252145

## 2010-12-16 NOTE — Telephone Encounter (Signed)
Message copied by Brenton Grills on Fri Dec 16, 2010  4:31 PM ------      Message from: Romero Belling      Created: Fri Dec 16, 2010 10:41 AM       please call patient:      Ov is due.  Last seen here 2010

## 2010-12-16 NOTE — Telephone Encounter (Signed)
Per MD, pt is due for F/U OV. Last OV 2010. Left message for pt to callback office. Karrie Doffing, CMA 4:34pm 12/16/2010

## 2010-12-19 LAB — COMPREHENSIVE METABOLIC PANEL
ALT: 11 U/L (ref 0–35)
ALT: 12 U/L (ref 0–35)
ALT: 12 U/L (ref 0–35)
ALT: 13 U/L (ref 0–35)
ALT: 14 U/L (ref 0–35)
ALT: 14 U/L (ref 0–35)
ALT: 15 U/L (ref 0–35)
ALT: 15 U/L (ref 0–35)
ALT: 16 U/L (ref 0–35)
AST: 14 U/L (ref 0–37)
AST: 18 U/L (ref 0–37)
AST: 19 U/L (ref 0–37)
AST: 21 U/L (ref 0–37)
AST: 21 U/L (ref 0–37)
AST: 21 U/L (ref 0–37)
Albumin: 1.6 g/dL — ABNORMAL LOW (ref 3.5–5.2)
Albumin: 1.7 g/dL — ABNORMAL LOW (ref 3.5–5.2)
Albumin: 2 g/dL — ABNORMAL LOW (ref 3.5–5.2)
Albumin: 2.2 g/dL — ABNORMAL LOW (ref 3.5–5.2)
Albumin: 2.5 g/dL — ABNORMAL LOW (ref 3.5–5.2)
Alkaline Phosphatase: 69 U/L (ref 39–117)
Alkaline Phosphatase: 82 U/L (ref 39–117)
Alkaline Phosphatase: 83 U/L (ref 39–117)
BUN: 34 mg/dL — ABNORMAL HIGH (ref 6–23)
BUN: 38 mg/dL — ABNORMAL HIGH (ref 6–23)
BUN: 39 mg/dL — ABNORMAL HIGH (ref 6–23)
BUN: 43 mg/dL — ABNORMAL HIGH (ref 6–23)
BUN: 44 mg/dL — ABNORMAL HIGH (ref 6–23)
CO2: 11 mEq/L — ABNORMAL LOW (ref 19–32)
CO2: 15 mEq/L — ABNORMAL LOW (ref 19–32)
CO2: 17 mEq/L — ABNORMAL LOW (ref 19–32)
CO2: 17 mEq/L — ABNORMAL LOW (ref 19–32)
CO2: 18 mEq/L — ABNORMAL LOW (ref 19–32)
CO2: 21 mEq/L (ref 19–32)
CO2: 22 mEq/L (ref 19–32)
CO2: 25 mEq/L (ref 19–32)
Calcium: 8 mg/dL — ABNORMAL LOW (ref 8.4–10.5)
Calcium: 8.2 mg/dL — ABNORMAL LOW (ref 8.4–10.5)
Calcium: 8.4 mg/dL (ref 8.4–10.5)
Calcium: 8.4 mg/dL (ref 8.4–10.5)
Calcium: 8.5 mg/dL (ref 8.4–10.5)
Calcium: 8.6 mg/dL (ref 8.4–10.5)
Calcium: 8.8 mg/dL (ref 8.4–10.5)
Calcium: 8.8 mg/dL (ref 8.4–10.5)
Chloride: 109 mEq/L (ref 96–112)
Chloride: 110 mEq/L (ref 96–112)
Chloride: 112 mEq/L (ref 96–112)
Chloride: 113 mEq/L — ABNORMAL HIGH (ref 96–112)
Chloride: 119 mEq/L — ABNORMAL HIGH (ref 96–112)
Creatinine, Ser: 1.53 mg/dL — ABNORMAL HIGH (ref 0.4–1.2)
Creatinine, Ser: 1.54 mg/dL — ABNORMAL HIGH (ref 0.4–1.2)
Creatinine, Ser: 1.59 mg/dL — ABNORMAL HIGH (ref 0.4–1.2)
Creatinine, Ser: 1.66 mg/dL — ABNORMAL HIGH (ref 0.4–1.2)
Creatinine, Ser: 1.83 mg/dL — ABNORMAL HIGH (ref 0.4–1.2)
Creatinine, Ser: 1.84 mg/dL — ABNORMAL HIGH (ref 0.4–1.2)
GFR calc Af Amer: 34 mL/min — ABNORMAL LOW (ref >60)
GFR calc Af Amer: 34 mL/min — ABNORMAL LOW (ref >60)
GFR calc Af Amer: 35 mL/min — ABNORMAL LOW (ref >60)
GFR calc Af Amer: 37 mL/min — ABNORMAL LOW (ref >60)
GFR calc Af Amer: 39 mL/min — ABNORMAL LOW (ref >60)
GFR calc Af Amer: 41 mL/min — ABNORMAL LOW (ref >60)
GFR calc non Af Amer: 27 mL/min — ABNORMAL LOW (ref >60)
GFR calc non Af Amer: 28 mL/min — ABNORMAL LOW (ref >60)
GFR calc non Af Amer: 28 mL/min — ABNORMAL LOW (ref >60)
GFR calc non Af Amer: 31 mL/min — ABNORMAL LOW (ref >60)
GFR calc non Af Amer: 32 mL/min — ABNORMAL LOW (ref >60)
GFR calc non Af Amer: 34 mL/min — ABNORMAL LOW (ref >60)
GFR calc non Af Amer: 34 mL/min — ABNORMAL LOW (ref >60)
Glucose, Bld: 108 mg/dL — ABNORMAL HIGH (ref 70–99)
Glucose, Bld: 132 mg/dL — ABNORMAL HIGH (ref 70–99)
Glucose, Bld: 137 mg/dL — ABNORMAL HIGH (ref 70–99)
Glucose, Bld: 153 mg/dL — ABNORMAL HIGH (ref 70–99)
Glucose, Bld: 204 mg/dL — ABNORMAL HIGH (ref 70–99)
Glucose, Bld: 214 mg/dL — ABNORMAL HIGH (ref 70–99)
Glucose, Bld: 242 mg/dL — ABNORMAL HIGH (ref 70–99)
Potassium: 2.8 mEq/L — ABNORMAL LOW (ref 3.5–5.1)
Potassium: 4.1 mEq/L (ref 3.5–5.1)
Potassium: 4.4 mEq/L (ref 3.5–5.1)
Sodium: 131 mEq/L — ABNORMAL LOW (ref 135–145)
Sodium: 134 mEq/L — ABNORMAL LOW (ref 135–145)
Sodium: 137 mEq/L (ref 135–145)
Sodium: 137 mEq/L (ref 135–145)
Sodium: 139 mEq/L (ref 135–145)
Sodium: 140 mEq/L (ref 135–145)
Sodium: 142 mEq/L (ref 135–145)
Total Bilirubin: 0.5 mg/dL (ref 0.3–1.2)
Total Bilirubin: 0.6 mg/dL (ref 0.3–1.2)
Total Bilirubin: 0.6 mg/dL (ref 0.3–1.2)
Total Bilirubin: 0.6 mg/dL (ref 0.3–1.2)
Total Bilirubin: 0.7 mg/dL (ref 0.3–1.2)
Total Protein: 3.5 g/dL — ABNORMAL LOW (ref 6.0–8.3)
Total Protein: 3.6 g/dL — ABNORMAL LOW (ref 6.0–8.3)
Total Protein: 3.8 g/dL — ABNORMAL LOW (ref 6.0–8.3)
Total Protein: 4 g/dL — ABNORMAL LOW (ref 6.0–8.3)
Total Protein: 4.4 g/dL — ABNORMAL LOW (ref 6.0–8.3)
Total Protein: 4.4 g/dL — ABNORMAL LOW (ref 6.0–8.3)

## 2010-12-19 LAB — GLUCOSE, CAPILLARY
Glucose-Capillary: 122 mg/dL — ABNORMAL HIGH (ref 70–99)
Glucose-Capillary: 129 mg/dL — ABNORMAL HIGH (ref 70–99)
Glucose-Capillary: 130 mg/dL — ABNORMAL HIGH (ref 70–99)
Glucose-Capillary: 138 mg/dL — ABNORMAL HIGH (ref 70–99)
Glucose-Capillary: 149 mg/dL — ABNORMAL HIGH (ref 70–99)
Glucose-Capillary: 153 mg/dL — ABNORMAL HIGH (ref 70–99)
Glucose-Capillary: 154 mg/dL — ABNORMAL HIGH (ref 70–99)
Glucose-Capillary: 174 mg/dL — ABNORMAL HIGH (ref 70–99)
Glucose-Capillary: 180 mg/dL — ABNORMAL HIGH (ref 70–99)
Glucose-Capillary: 183 mg/dL — ABNORMAL HIGH (ref 70–99)
Glucose-Capillary: 184 mg/dL — ABNORMAL HIGH (ref 70–99)
Glucose-Capillary: 185 mg/dL — ABNORMAL HIGH (ref 70–99)
Glucose-Capillary: 191 mg/dL — ABNORMAL HIGH (ref 70–99)
Glucose-Capillary: 193 mg/dL — ABNORMAL HIGH (ref 70–99)
Glucose-Capillary: 204 mg/dL — ABNORMAL HIGH (ref 70–99)
Glucose-Capillary: 207 mg/dL — ABNORMAL HIGH (ref 70–99)
Glucose-Capillary: 223 mg/dL — ABNORMAL HIGH (ref 70–99)
Glucose-Capillary: 224 mg/dL — ABNORMAL HIGH (ref 70–99)
Glucose-Capillary: 242 mg/dL — ABNORMAL HIGH (ref 70–99)
Glucose-Capillary: 259 mg/dL — ABNORMAL HIGH (ref 70–99)
Glucose-Capillary: 263 mg/dL — ABNORMAL HIGH (ref 70–99)
Glucose-Capillary: 376 mg/dL — ABNORMAL HIGH (ref 70–99)
Glucose-Capillary: 85 mg/dL (ref 70–99)

## 2010-12-19 LAB — CBC
HCT: 28 % — ABNORMAL LOW (ref 36.0–46.0)
HCT: 30.2 % — ABNORMAL LOW (ref 36.0–46.0)
HCT: 31.1 % — ABNORMAL LOW (ref 36.0–46.0)
HCT: 32 % — ABNORMAL LOW (ref 36.0–46.0)
HCT: 32.2 % — ABNORMAL LOW (ref 36.0–46.0)
HCT: 33.7 % — ABNORMAL LOW (ref 36.0–46.0)
HCT: 34.1 % — ABNORMAL LOW (ref 36.0–46.0)
HCT: 36.3 % (ref 36.0–46.0)
HCT: 38.6 % (ref 36.0–46.0)
Hemoglobin: 10.7 g/dL — ABNORMAL LOW (ref 12.0–15.0)
Hemoglobin: 11.1 g/dL — ABNORMAL LOW (ref 12.0–15.0)
Hemoglobin: 11.4 g/dL — ABNORMAL LOW (ref 12.0–15.0)
Hemoglobin: 12.1 g/dL (ref 12.0–15.0)
Hemoglobin: 12.7 g/dL (ref 12.0–15.0)
Hemoglobin: 9.4 g/dL — ABNORMAL LOW (ref 12.0–15.0)
Hemoglobin: 9.6 g/dL — ABNORMAL LOW (ref 12.0–15.0)
MCHC: 32.5 g/dL (ref 30.0–36.0)
MCHC: 32.8 g/dL (ref 30.0–36.0)
MCHC: 32.9 g/dL (ref 30.0–36.0)
MCHC: 33.3 g/dL (ref 30.0–36.0)
MCHC: 33.3 g/dL (ref 30.0–36.0)
MCHC: 33.4 g/dL (ref 30.0–36.0)
MCHC: 33.4 g/dL (ref 30.0–36.0)
MCHC: 33.6 g/dL (ref 30.0–36.0)
MCV: 100 fL (ref 78.0–100.0)
MCV: 101.4 fL — ABNORMAL HIGH (ref 78.0–100.0)
MCV: 98.4 fL (ref 78.0–100.0)
MCV: 99.4 fL (ref 78.0–100.0)
MCV: 99.5 fL (ref 78.0–100.0)
MCV: 99.7 fL (ref 78.0–100.0)
MCV: 99.7 fL (ref 78.0–100.0)
MCV: 99.8 fL (ref 78.0–100.0)
Platelets: 139 10*3/uL — ABNORMAL LOW (ref 150–400)
Platelets: 146 10*3/uL — ABNORMAL LOW (ref 150–400)
Platelets: 152 10*3/uL (ref 150–400)
Platelets: 156 10*3/uL (ref 150–400)
Platelets: 161 10*3/uL (ref 150–400)
Platelets: 212 10*3/uL (ref 150–400)
Platelets: 221 10*3/uL (ref 150–400)
Platelets: 223 10*3/uL (ref 150–400)
Platelets: 225 10*3/uL (ref 150–400)
RBC: 2.83 MIL/uL — ABNORMAL LOW (ref 3.87–5.11)
RBC: 3.38 MIL/uL — ABNORMAL LOW (ref 3.87–5.11)
RBC: 3.43 MIL/uL — ABNORMAL LOW (ref 3.87–5.11)
RBC: 3.63 MIL/uL — ABNORMAL LOW (ref 3.87–5.11)
RDW: 16.8 % — ABNORMAL HIGH (ref 11.5–15.5)
RDW: 17.4 % — ABNORMAL HIGH (ref 11.5–15.5)
RDW: 17.5 % — ABNORMAL HIGH (ref 11.5–15.5)
RDW: 17.5 % — ABNORMAL HIGH (ref 11.5–15.5)
RDW: 17.6 % — ABNORMAL HIGH (ref 11.5–15.5)
RDW: 17.6 % — ABNORMAL HIGH (ref 11.5–15.5)
RDW: 17.8 % — ABNORMAL HIGH (ref 11.5–15.5)
RDW: 18.8 % — ABNORMAL HIGH (ref 11.5–15.5)
WBC: 16.1 10*3/uL — ABNORMAL HIGH (ref 4.0–10.5)
WBC: 18.5 10*3/uL — ABNORMAL HIGH (ref 4.0–10.5)
WBC: 18.8 10*3/uL — ABNORMAL HIGH (ref 4.0–10.5)
WBC: 21 10*3/uL — ABNORMAL HIGH (ref 4.0–10.5)
WBC: 23.7 10*3/uL — ABNORMAL HIGH (ref 4.0–10.5)

## 2010-12-19 LAB — LIPASE, BLOOD: Lipase: 15 U/L (ref 11–59)

## 2010-12-19 LAB — POCT CARDIAC MARKERS

## 2010-12-19 LAB — CULTURE, BLOOD (ROUTINE X 2)
Culture: NO GROWTH
Culture: NO GROWTH

## 2010-12-19 LAB — URINE CULTURE: Colony Count: NO GROWTH

## 2010-12-19 LAB — PROTIME-INR
INR: 2.94 — ABNORMAL HIGH (ref 0.00–1.49)
INR: 3.35 — ABNORMAL HIGH (ref 0.00–1.49)
INR: 3.54 — ABNORMAL HIGH (ref 0.00–1.49)
INR: 3.61 — ABNORMAL HIGH (ref 0.00–1.49)
Prothrombin Time: 26.6 seconds — ABNORMAL HIGH (ref 11.6–15.2)
Prothrombin Time: 26.8 seconds — ABNORMAL HIGH (ref 11.6–15.2)
Prothrombin Time: 27.8 seconds — ABNORMAL HIGH (ref 11.6–15.2)
Prothrombin Time: 33.3 seconds — ABNORMAL HIGH (ref 11.6–15.2)
Prothrombin Time: 33.7 seconds — ABNORMAL HIGH (ref 11.6–15.2)
Prothrombin Time: 34.2 seconds — ABNORMAL HIGH (ref 11.6–15.2)
Prothrombin Time: 35.2 seconds — ABNORMAL HIGH (ref 11.6–15.2)
Prothrombin Time: 35.7 seconds — ABNORMAL HIGH (ref 11.6–15.2)

## 2010-12-19 LAB — URINE MICROSCOPIC-ADD ON

## 2010-12-19 LAB — BASIC METABOLIC PANEL
BUN: 41 mg/dL — ABNORMAL HIGH (ref 6–23)
BUN: 44 mg/dL — ABNORMAL HIGH (ref 6–23)
CO2: 14 mEq/L — ABNORMAL LOW (ref 19–32)
CO2: 20 mEq/L (ref 19–32)
Calcium: 8.1 mg/dL — ABNORMAL LOW (ref 8.4–10.5)
Glucose, Bld: 103 mg/dL — ABNORMAL HIGH (ref 70–99)
Glucose, Bld: 287 mg/dL — ABNORMAL HIGH (ref 70–99)
Potassium: 4 mEq/L (ref 3.5–5.1)
Potassium: 4.6 mEq/L (ref 3.5–5.1)
Sodium: 133 mEq/L — ABNORMAL LOW (ref 135–145)
Sodium: 136 mEq/L (ref 135–145)

## 2010-12-19 LAB — HEPATIC FUNCTION PANEL
Albumin: 1.7 g/dL — ABNORMAL LOW (ref 3.5–5.2)
Alkaline Phosphatase: 71 U/L (ref 39–117)
Bilirubin, Direct: 0.2 mg/dL (ref 0.0–0.3)
Indirect Bilirubin: 0.3 mg/dL (ref 0.3–0.9)
Total Bilirubin: 0.5 mg/dL (ref 0.3–1.2)

## 2010-12-19 LAB — URINALYSIS, ROUTINE W REFLEX MICROSCOPIC
Ketones, ur: 15 mg/dL — AB
Nitrite: POSITIVE — AB
Protein, ur: NEGATIVE mg/dL
pH: 5 (ref 5.0–8.0)

## 2010-12-19 LAB — DIFFERENTIAL
Basophils Relative: 0 % (ref 0–1)
Eosinophils Absolute: 0.2 10*3/uL (ref 0.0–0.7)
Eosinophils Relative: 1 % (ref 0–5)
Lymphs Abs: 2.2 10*3/uL (ref 0.7–4.0)
Monocytes Absolute: 0.9 10*3/uL (ref 0.1–1.0)
Neutro Abs: 18.4 10*3/uL — ABNORMAL HIGH (ref 1.7–7.7)

## 2010-12-19 LAB — CLOSTRIDIUM DIFFICILE EIA

## 2010-12-19 LAB — URINALYSIS, MICROSCOPIC ONLY
Nitrite: POSITIVE — AB
Specific Gravity, Urine: 1.025 (ref 1.005–1.030)
Urobilinogen, UA: 0.2 mg/dL (ref 0.0–1.0)
pH: 5.5 (ref 5.0–8.0)

## 2010-12-19 LAB — MRSA PCR SCREENING: MRSA by PCR: NEGATIVE

## 2010-12-19 LAB — LACTIC ACID, PLASMA: Lactic Acid, Venous: 1.8 mmol/L (ref 0.5–2.2)

## 2010-12-19 LAB — PHOSPHORUS: Phosphorus: 4.2 mg/dL (ref 2.3–4.6)

## 2010-12-19 LAB — HEPARIN LEVEL (UNFRACTIONATED): Heparin Unfractionated: >2 IU/mL — ABNORMAL HIGH (ref 0.30–0.70)

## 2010-12-19 NOTE — Telephone Encounter (Signed)
Per pt, she states that she will not schedule a OV unless her PCP tells her to do so.

## 2010-12-21 LAB — COMPREHENSIVE METABOLIC PANEL
ALT: 16 U/L (ref 0–35)
ALT: 23 U/L (ref 0–35)
AST: 24 U/L (ref 0–37)
Albumin: 2.5 g/dL — ABNORMAL LOW (ref 3.5–5.2)
Alkaline Phosphatase: 52 U/L (ref 39–117)
Alkaline Phosphatase: 83 U/L (ref 39–117)
BUN: 186 mg/dL — ABNORMAL HIGH (ref 6–23)
CO2: 14 mEq/L — ABNORMAL LOW (ref 19–32)
Chloride: 108 mEq/L (ref 96–112)
Chloride: 92 mEq/L — ABNORMAL LOW (ref 96–112)
Creatinine, Ser: 3.73 mg/dL — ABNORMAL HIGH (ref 0.4–1.2)
GFR calc Af Amer: 15 mL/min — ABNORMAL LOW (ref >60)
GFR calc non Af Amer: 10 mL/min — ABNORMAL LOW (ref >60)
Glucose, Bld: 160 mg/dL — ABNORMAL HIGH (ref 70–99)
Glucose, Bld: 317 mg/dL — ABNORMAL HIGH (ref 70–99)
Potassium: 3.9 mEq/L (ref 3.5–5.1)
Potassium: 4.8 mEq/L (ref 3.5–5.1)
Sodium: 139 mEq/L (ref 135–145)
Sodium: 127 mEq/L — ABNORMAL LOW (ref 135–145)
Total Bilirubin: 0.8 mg/dL (ref 0.3–1.2)
Total Protein: 5.3 g/dL — ABNORMAL LOW (ref 6.0–8.3)
Total Protein: 5.6 g/dL — ABNORMAL LOW (ref 6.0–8.3)
Total Protein: 6.3 g/dL (ref 6.0–8.3)

## 2010-12-21 LAB — WOUND CULTURE

## 2010-12-21 LAB — URINALYSIS, ROUTINE W REFLEX MICROSCOPIC
Hgb urine dipstick: NEGATIVE
Nitrite: NEGATIVE
Specific Gravity, Urine: 1.024 (ref 1.005–1.030)
Specific Gravity, Urine: 1.022 (ref 1.005–1.030)
Urobilinogen, UA: 0.2 mg/dL (ref 0.0–1.0)
pH: 5.5 (ref 5.0–8.0)
pH: 5 (ref 5.0–8.0)

## 2010-12-21 LAB — DIFFERENTIAL
Basophils Relative: 1 % (ref 0–1)
Basophils Relative: 0 % (ref 0–1)
Eosinophils Absolute: 0.1 10*3/uL (ref 0.0–0.7)
Eosinophils Absolute: 0 10*3/uL (ref 0.0–0.7)
Eosinophils Relative: 0 % (ref 0–5)
Lymphocytes Relative: 1 % — ABNORMAL LOW (ref 12–46)
Lymphs Abs: 0.2 10*3/uL — ABNORMAL LOW (ref 0.7–4.0)
Monocytes Absolute: 0.4 10*3/uL (ref 0.1–1.0)
Monocytes Absolute: 0.4 10*3/uL (ref 0.1–1.0)
Monocytes Relative: 5 % (ref 3–12)
Monocytes Relative: 2 % — ABNORMAL LOW (ref 3–12)
Monocytes Relative: 2 % — ABNORMAL LOW (ref 3–12)
Neutro Abs: 18.1 10*3/uL — ABNORMAL HIGH (ref 1.7–7.7)
Neutrophils Relative %: 83 % — ABNORMAL HIGH (ref 43–77)
Neutrophils Relative %: 96 % — ABNORMAL HIGH (ref 43–77)

## 2010-12-21 LAB — URINE CULTURE: Colony Count: 80000

## 2010-12-21 LAB — BLOOD GAS, ARTERIAL
Bicarbonate: 10 mEq/L — ABNORMAL LOW (ref 20.0–24.0)
Drawn by: 23281
FIO2: 0.21 %
Patient temperature: 98.1
Patient temperature: 98.6
TCO2: 12.2 mmol/L (ref 0–100)
pH, Arterial: 7.36 (ref 7.350–7.400)
pH, Arterial: 7.363 (ref 7.350–7.400)

## 2010-12-21 LAB — GLUCOSE, CAPILLARY
Glucose-Capillary: 118 mg/dL — ABNORMAL HIGH (ref 70–99)
Glucose-Capillary: 119 mg/dL — ABNORMAL HIGH (ref 70–99)
Glucose-Capillary: 120 mg/dL — ABNORMAL HIGH (ref 70–99)
Glucose-Capillary: 137 mg/dL — ABNORMAL HIGH (ref 70–99)
Glucose-Capillary: 139 mg/dL — ABNORMAL HIGH (ref 70–99)
Glucose-Capillary: 143 mg/dL — ABNORMAL HIGH (ref 70–99)
Glucose-Capillary: 144 mg/dL — ABNORMAL HIGH (ref 70–99)
Glucose-Capillary: 171 mg/dL — ABNORMAL HIGH (ref 70–99)
Glucose-Capillary: 174 mg/dL — ABNORMAL HIGH (ref 70–99)
Glucose-Capillary: 221 mg/dL — ABNORMAL HIGH (ref 70–99)
Glucose-Capillary: 229 mg/dL — ABNORMAL HIGH (ref 70–99)
Glucose-Capillary: 78 mg/dL (ref 70–99)
Glucose-Capillary: 107 mg/dL — ABNORMAL HIGH (ref 70–99)
Glucose-Capillary: 132 mg/dL — ABNORMAL HIGH (ref 70–99)
Glucose-Capillary: 146 mg/dL — ABNORMAL HIGH (ref 70–99)
Glucose-Capillary: 150 mg/dL — ABNORMAL HIGH (ref 70–99)
Glucose-Capillary: 168 mg/dL — ABNORMAL HIGH (ref 70–99)
Glucose-Capillary: 169 mg/dL — ABNORMAL HIGH (ref 70–99)
Glucose-Capillary: 173 mg/dL — ABNORMAL HIGH (ref 70–99)
Glucose-Capillary: 223 mg/dL — ABNORMAL HIGH (ref 70–99)
Glucose-Capillary: 234 mg/dL — ABNORMAL HIGH (ref 70–99)
Glucose-Capillary: 250 mg/dL — ABNORMAL HIGH (ref 70–99)
Glucose-Capillary: 266 mg/dL — ABNORMAL HIGH (ref 70–99)
Glucose-Capillary: 297 mg/dL — ABNORMAL HIGH (ref 70–99)
Glucose-Capillary: 308 mg/dL — ABNORMAL HIGH (ref 70–99)
Glucose-Capillary: 335 mg/dL — ABNORMAL HIGH (ref 70–99)
Glucose-Capillary: 361 mg/dL — ABNORMAL HIGH (ref 70–99)
Glucose-Capillary: 370 mg/dL — ABNORMAL HIGH (ref 70–99)

## 2010-12-21 LAB — BASIC METABOLIC PANEL
BUN: 44 mg/dL — ABNORMAL HIGH (ref 6–23)
BUN: 103 mg/dL — ABNORMAL HIGH (ref 6–23)
BUN: 133 mg/dL — ABNORMAL HIGH (ref 6–23)
BUN: 163 mg/dL — ABNORMAL HIGH (ref 6–23)
CO2: 19 mEq/L (ref 19–32)
CO2: 21 mEq/L (ref 19–32)
CO2: 16 mEq/L — ABNORMAL LOW (ref 19–32)
CO2: 18 mEq/L — ABNORMAL LOW (ref 19–32)
CO2: 19 mEq/L (ref 19–32)
CO2: 22 mEq/L (ref 19–32)
Calcium: 8.1 mg/dL — ABNORMAL LOW (ref 8.4–10.5)
Calcium: 8.4 mg/dL (ref 8.4–10.5)
Calcium: 8.5 mg/dL (ref 8.4–10.5)
Calcium: 8.1 mg/dL — ABNORMAL LOW (ref 8.4–10.5)
Calcium: 8.2 mg/dL — ABNORMAL LOW (ref 8.4–10.5)
Calcium: 8.5 mg/dL (ref 8.4–10.5)
Calcium: 8.7 mg/dL (ref 8.4–10.5)
Calcium: 8.7 mg/dL (ref 8.4–10.5)
Calcium: 8.9 mg/dL (ref 8.4–10.5)
Chloride: 115 mEq/L — ABNORMAL HIGH (ref 96–112)
Chloride: 112 mEq/L (ref 96–112)
Chloride: 115 mEq/L — ABNORMAL HIGH (ref 96–112)
Chloride: 115 mEq/L — ABNORMAL HIGH (ref 96–112)
Creatinine, Ser: 1.31 mg/dL — ABNORMAL HIGH (ref 0.4–1.2)
Creatinine, Ser: 1.78 mg/dL — ABNORMAL HIGH (ref 0.4–1.2)
Creatinine, Ser: 1.29 mg/dL — ABNORMAL HIGH (ref 0.4–1.2)
Creatinine, Ser: 1.73 mg/dL — ABNORMAL HIGH (ref 0.4–1.2)
Creatinine, Ser: 2.78 mg/dL — ABNORMAL HIGH (ref 0.4–1.2)
Creatinine, Ser: 4.16 mg/dL — ABNORMAL HIGH (ref 0.4–1.2)
GFR calc Af Amer: 30 mL/min — ABNORMAL LOW (ref >60)
GFR calc Af Amer: 39 mL/min — ABNORMAL LOW (ref >60)
GFR calc Af Amer: 13 mL/min — ABNORMAL LOW (ref >60)
GFR calc Af Amer: 21 mL/min — ABNORMAL LOW (ref >60)
GFR calc Af Amer: 29 mL/min — ABNORMAL LOW (ref >60)
GFR calc Af Amer: >60 mL/min (ref >60)
GFR calc non Af Amer: 25 mL/min — ABNORMAL LOW (ref >60)
GFR calc non Af Amer: 28 mL/min — ABNORMAL LOW (ref >60)
GFR calc non Af Amer: 32 mL/min — ABNORMAL LOW (ref >60)
GFR calc non Af Amer: 11 mL/min — ABNORMAL LOW (ref >60)
GFR calc non Af Amer: 12 mL/min — ABNORMAL LOW (ref >60)
GFR calc non Af Amer: 21 mL/min — ABNORMAL LOW (ref >60)
GFR calc non Af Amer: 24 mL/min — ABNORMAL LOW (ref >60)
GFR calc non Af Amer: 29 mL/min — ABNORMAL LOW (ref >60)
Glucose, Bld: 181 mg/dL — ABNORMAL HIGH (ref 70–99)
Glucose, Bld: 185 mg/dL — ABNORMAL HIGH (ref 70–99)
Glucose, Bld: 126 mg/dL — ABNORMAL HIGH (ref 70–99)
Glucose, Bld: 131 mg/dL — ABNORMAL HIGH (ref 70–99)
Glucose, Bld: 154 mg/dL — ABNORMAL HIGH (ref 70–99)
Glucose, Bld: 337 mg/dL — ABNORMAL HIGH (ref 70–99)
Potassium: 3.6 mEq/L (ref 3.5–5.1)
Potassium: 3.8 mEq/L (ref 3.5–5.1)
Potassium: 2.9 mEq/L — ABNORMAL LOW (ref 3.5–5.1)
Potassium: 3 mEq/L — ABNORMAL LOW (ref 3.5–5.1)
Potassium: 4.7 mEq/L (ref 3.5–5.1)
Sodium: 136 mEq/L (ref 135–145)
Sodium: 138 mEq/L (ref 135–145)
Sodium: 140 mEq/L (ref 135–145)
Sodium: 130 mEq/L — ABNORMAL LOW (ref 135–145)
Sodium: 132 mEq/L — ABNORMAL LOW (ref 135–145)
Sodium: 136 mEq/L (ref 135–145)
Sodium: 136 mEq/L (ref 135–145)
Sodium: 141 mEq/L (ref 135–145)

## 2010-12-21 LAB — FOLATE RBC: RBC Folate: 1102 ng/mL — ABNORMAL HIGH (ref 180–600)

## 2010-12-21 LAB — CBC
HCT: 24.4 % — ABNORMAL LOW (ref 36.0–46.0)
HCT: 31.6 % — ABNORMAL LOW (ref 36.0–46.0)
HCT: 36.4 % (ref 36.0–46.0)
Hemoglobin: 10.8 g/dL — ABNORMAL LOW (ref 12.0–15.0)
Hemoglobin: 8.2 g/dL — ABNORMAL LOW (ref 12.0–15.0)
Hemoglobin: 9.3 g/dL — ABNORMAL LOW (ref 12.0–15.0)
Hemoglobin: 10.6 g/dL — ABNORMAL LOW (ref 12.0–15.0)
Hemoglobin: 13.7 g/dL (ref 12.0–15.0)
MCHC: 33.6 g/dL (ref 30.0–36.0)
MCHC: 33.8 g/dL (ref 30.0–36.0)
MCV: 95.4 fL (ref 78.0–100.0)
MCV: 96 fL (ref 78.0–100.0)
MCV: 97.5 fL (ref 78.0–100.0)
Platelets: 138 10*3/uL — ABNORMAL LOW (ref 150–400)
Platelets: 153 10*3/uL (ref 150–400)
Platelets: 176 10*3/uL (ref 150–400)
Platelets: 219 10*3/uL (ref 150–400)
RBC: 2.49 MIL/uL — ABNORMAL LOW (ref 3.87–5.11)
RBC: 2.85 MIL/uL — ABNORMAL LOW (ref 3.87–5.11)
RBC: 3.27 MIL/uL — ABNORMAL LOW (ref 3.87–5.11)
RBC: 3.11 MIL/uL — ABNORMAL LOW (ref 3.87–5.11)
RBC: 3.24 MIL/uL — ABNORMAL LOW (ref 3.87–5.11)
RBC: 4.13 MIL/uL (ref 3.87–5.11)
RDW: 16.6 % — ABNORMAL HIGH (ref 11.5–15.5)
RDW: 16.7 % — ABNORMAL HIGH (ref 11.5–15.5)
RDW: 17.1 % — ABNORMAL HIGH (ref 11.5–15.5)
RDW: 13.5 % (ref 11.5–15.5)
WBC: 4.9 10*3/uL (ref 4.0–10.5)
WBC: 7.1 10*3/uL (ref 4.0–10.5)
WBC: 19.1 10*3/uL — ABNORMAL HIGH (ref 4.0–10.5)
WBC: 19.9 10*3/uL — ABNORMAL HIGH (ref 4.0–10.5)
WBC: 8.6 10*3/uL (ref 4.0–10.5)

## 2010-12-21 LAB — URINE MICROSCOPIC-ADD ON

## 2010-12-21 LAB — CARDIAC PANEL(CRET KIN+CKTOT+MB+TROPI)
CK, MB: 26.8 ng/mL — ABNORMAL HIGH (ref 0.3–4.0)
Relative Index: INVALID (ref 0.0–2.5)
Relative Index: INVALID (ref 0.0–2.5)
Relative Index: INVALID (ref 0.0–2.5)
Total CK: 48 U/L (ref 7–177)
Total CK: 55 U/L (ref 7–177)

## 2010-12-21 LAB — LIPID PANEL
Cholesterol: 195 mg/dL (ref 0–200)
LDL Cholesterol: 115 mg/dL — ABNORMAL HIGH (ref 0–99)
VLDL: 44 mg/dL — ABNORMAL HIGH (ref 0–40)

## 2010-12-21 LAB — MAGNESIUM: Magnesium: 3.3 mg/dL — ABNORMAL HIGH (ref 1.5–2.5)

## 2010-12-21 LAB — PROTIME-INR
INR: 1.22 (ref 0.00–1.49)
Prothrombin Time: 15.3 seconds — ABNORMAL HIGH (ref 11.6–15.2)

## 2010-12-21 LAB — APTT: aPTT: 24 seconds (ref 24–37)

## 2010-12-21 LAB — RETICULOCYTES
RBC.: 2.61 MIL/uL — ABNORMAL LOW (ref 3.87–5.11)
Retic Count, Absolute: 80.9 10*3/uL (ref 19.0–186.0)
Retic Ct Pct: 3.1 % (ref 0.4–3.1)

## 2010-12-21 LAB — CULTURE, BLOOD (ROUTINE X 2)

## 2010-12-21 LAB — SODIUM, URINE, RANDOM: Sodium, Ur: <15 mEq/L

## 2010-12-21 LAB — PHOSPHORUS: Phosphorus: 9 mg/dL — ABNORMAL HIGH (ref 2.3–4.6)

## 2010-12-21 LAB — CREATININE, URINE, RANDOM: Creatinine, Urine: 83.1 mg/dL

## 2010-12-21 LAB — TSH: TSH: 0.462 u[IU]/mL (ref 0.350–4.500)

## 2011-01-03 ENCOUNTER — Ambulatory Visit: Payer: Medicare Other | Admitting: Internal Medicine

## 2011-01-05 DIAGNOSIS — N390 Urinary tract infection, site not specified: Secondary | ICD-10-CM

## 2011-01-30 ENCOUNTER — Telehealth: Payer: Self-pay | Admitting: Internal Medicine

## 2011-01-30 NOTE — Telephone Encounter (Signed)
Left message for patient to call.

## 2011-01-30 NOTE — Telephone Encounter (Signed)
Spoke with pt regarding lab work.  Pt due for a BMET when labs are drawn to assess potassium levels after stopping then restarting lasix.

## 2011-01-30 NOTE — Telephone Encounter (Signed)
Refer this to [facility]- I can't know without the chart and Stacie can review whwere we are with this

## 2011-01-31 NOTE — Procedures (Signed)
DUPLEX DEEP VENOUS EXAM - LOWER EXTREMITY   INDICATION:  Open wounds, ulcers.   HISTORY:  Edema:  Yes.  Trauma/Surgery:  No.  Pain:  Yes.  PE:  No.  Previous DVT:  Yes.  Anticoagulants:  Yes.  Other:   DUPLEX EXAM:                CFV   SFV   PopV  PTV    GSV                R  L  R  L  R  L  R   L  R  L  Thrombosis    o  o  o  o  o  o  o   o  o  o  Spontaneous   +  +  +  +  +  +  +   +  +  +  Phasic        +  +  +  +  +  +  +   +  +  +  Augmentation  +  +  +  +  +  +  +   +  +  +  Compressible  +  +  +  +  +  +  +   +  +  +  Competent     o  o  o  o  +  +  +   +  o  o   Legend:  + - yes  o - no  p - partial  D - decreased   IMPRESSION:  1. No evidence of acute deep venous thrombosis within the bilateral      lower extremities.  2. Bilateral venous insufficiency within the deep venous systems in      the greater saphenous vein.    _____________________________  Larina Earthly, M.D.   OD/MEDQ  D:  07/01/2010  T:  07/01/2010  Job:  [id number]

## 2011-02-14 ENCOUNTER — Encounter (HOSPITAL_BASED_OUTPATIENT_CLINIC_OR_DEPARTMENT_OTHER): Payer: Medicare Other | Admitting: Oncology

## 2011-02-14 ENCOUNTER — Other Ambulatory Visit (HOSPITAL_COMMUNITY): Payer: Self-pay | Admitting: Oncology

## 2011-02-14 DIAGNOSIS — L109 Pemphigus, unspecified: Secondary | ICD-10-CM

## 2011-02-14 LAB — COMPREHENSIVE METABOLIC PANEL
ALT: 15 U/L (ref 0–35)
AST: 14 U/L (ref 0–37)
BUN: 35 mg/dL — ABNORMAL HIGH (ref 6–23)
Chloride: 107 mEq/L (ref 96–112)
Glucose, Bld: 281 mg/dL — ABNORMAL HIGH (ref 70–99)
Potassium: 4.7 mEq/L (ref 3.5–5.3)
Sodium: 143 mEq/L (ref 135–145)

## 2011-02-14 LAB — LACTATE DEHYDROGENASE: LDH: 169 U/L (ref 94–250)

## 2011-02-14 LAB — CBC WITH DIFFERENTIAL/PLATELET
Basophils Absolute: 0 10*3/uL (ref 0.0–0.1)
EOS%: 0.5 % (ref 0.0–7.0)
Eosinophils Absolute: 0.1 10*3/uL (ref 0.0–0.5)
HGB: 12.1 g/dL (ref 11.6–15.9)
NEUT#: 8.5 10*3/uL — ABNORMAL HIGH (ref 1.5–6.5)
RDW: 14.4 % (ref 11.2–14.5)
lymph#: 1.3 10*3/uL (ref 0.9–3.3)

## 2011-03-01 ENCOUNTER — Other Ambulatory Visit: Payer: Self-pay | Admitting: Internal Medicine

## 2011-03-04 ENCOUNTER — Other Ambulatory Visit: Payer: Self-pay | Admitting: Internal Medicine

## 2011-03-13 ENCOUNTER — Telehealth: Payer: Self-pay | Admitting: *Deleted

## 2011-03-13 NOTE — Telephone Encounter (Signed)
Patient called to let us know that she has agreed to be in an investigational study for her pemphigus.  She will be receiving Rituximab infusions.  Her first infusion is scheduled for this week.

## 2011-03-14 ENCOUNTER — Other Ambulatory Visit (HOSPITAL_COMMUNITY): Payer: Self-pay | Admitting: Oncology

## 2011-03-14 ENCOUNTER — Encounter (HOSPITAL_BASED_OUTPATIENT_CLINIC_OR_DEPARTMENT_OTHER): Payer: Medicare Other | Admitting: Oncology

## 2011-03-14 DIAGNOSIS — Z5111 Encounter for antineoplastic chemotherapy: Secondary | ICD-10-CM

## 2011-03-14 DIAGNOSIS — L109 Pemphigus, unspecified: Secondary | ICD-10-CM

## 2011-03-14 LAB — CBC WITH DIFFERENTIAL/PLATELET
Basophils Absolute: 0 10*3/uL (ref 0.0–0.1)
HCT: 35.9 % (ref 34.8–46.6)
HGB: 12.1 g/dL (ref 11.6–15.9)
MCH: 32.1 pg (ref 25.1–34.0)
MONO#: 0.4 10*3/uL (ref 0.1–0.9)
NEUT%: 82 % — ABNORMAL HIGH (ref 38.4–76.8)
Platelets: 164 10*3/uL (ref 145–400)
WBC: 11.4 10*3/uL — ABNORMAL HIGH (ref 3.9–10.3)
lymph#: 1.6 10*3/uL (ref 0.9–3.3)

## 2011-03-14 LAB — COMPREHENSIVE METABOLIC PANEL
AST: 18 U/L (ref 0–37)
Albumin: 3.2 g/dL — ABNORMAL LOW (ref 3.5–5.2)
Alkaline Phosphatase: 57 U/L (ref 39–117)
BUN: 26 mg/dL — ABNORMAL HIGH (ref 6–23)
Potassium: 4.4 mEq/L (ref 3.5–5.3)
Sodium: 142 mEq/L (ref 135–145)
Total Bilirubin: 0.3 mg/dL (ref 0.3–1.2)
Total Protein: 5.4 g/dL — ABNORMAL LOW (ref 6.0–8.3)

## 2011-03-14 LAB — HEPATITIS B CORE ANTIBODY, TOTAL: Hep B Core Total Ab: NEGATIVE

## 2011-03-14 LAB — HEPATITIS B CORE ANTIBODY, IGM: Hep B C IgM: NEGATIVE

## 2011-03-14 LAB — HEPATITIS C ANTIBODY: HCV Ab: NEGATIVE

## 2011-03-21 ENCOUNTER — Other Ambulatory Visit (HOSPITAL_COMMUNITY): Payer: Self-pay | Admitting: Oncology

## 2011-03-21 ENCOUNTER — Encounter (HOSPITAL_BASED_OUTPATIENT_CLINIC_OR_DEPARTMENT_OTHER): Payer: Medicare Other | Admitting: Oncology

## 2011-03-21 DIAGNOSIS — Z5111 Encounter for antineoplastic chemotherapy: Secondary | ICD-10-CM

## 2011-03-21 DIAGNOSIS — L109 Pemphigus, unspecified: Secondary | ICD-10-CM

## 2011-03-21 LAB — CBC WITH DIFFERENTIAL/PLATELET
BASO%: 0.1 % (ref 0.0–2.0)
EOS%: 0.9 % (ref 0.0–7.0)
HGB: 12.8 g/dL (ref 11.6–15.9)
MCH: 31 pg (ref 25.1–34.0)
MCHC: 32.7 g/dL (ref 31.5–36.0)
MONO%: 8 % (ref 0.0–14.0)
RBC: 4.13 10*6/uL (ref 3.70–5.45)
RDW: 14.2 % (ref 11.2–14.5)
lymph#: 1.9 10*3/uL (ref 0.9–3.3)

## 2011-03-21 LAB — COMPREHENSIVE METABOLIC PANEL
ALT: 21 U/L (ref 0–35)
AST: 20 U/L (ref 0–37)
Albumin: 3.3 g/dL — ABNORMAL LOW (ref 3.5–5.2)
Alkaline Phosphatase: 63 U/L (ref 39–117)
Calcium: 8.9 mg/dL (ref 8.4–10.5)
Chloride: 109 mEq/L (ref 96–112)
Potassium: 4.5 mEq/L (ref 3.5–5.3)
Sodium: 140 mEq/L (ref 135–145)

## 2011-03-24 ENCOUNTER — Telehealth: Payer: Self-pay | Admitting: Internal Medicine

## 2011-03-24 NOTE — Telephone Encounter (Signed)
Can pt come in for OV next week?

## 2011-03-28 ENCOUNTER — Encounter (HOSPITAL_BASED_OUTPATIENT_CLINIC_OR_DEPARTMENT_OTHER): Payer: Medicare Other | Admitting: Oncology

## 2011-03-28 ENCOUNTER — Other Ambulatory Visit (HOSPITAL_COMMUNITY): Payer: Self-pay | Admitting: Oncology

## 2011-03-28 DIAGNOSIS — Z5111 Encounter for antineoplastic chemotherapy: Secondary | ICD-10-CM

## 2011-03-28 DIAGNOSIS — L109 Pemphigus, unspecified: Secondary | ICD-10-CM

## 2011-03-28 LAB — CBC WITH DIFFERENTIAL/PLATELET
BASO%: 0.1 % (ref 0.0–2.0)
HCT: 40.7 % (ref 34.8–46.6)
LYMPH%: 15.9 % (ref 14.0–49.7)
MCH: 31.2 pg (ref 25.1–34.0)
MCHC: 32.9 g/dL (ref 31.5–36.0)
MCV: 94.7 fL (ref 79.5–101.0)
MONO#: 1.3 10*3/uL — ABNORMAL HIGH (ref 0.1–0.9)
MONO%: 8.7 % (ref 0.0–14.0)
NEUT%: 74.3 % (ref 38.4–76.8)
Platelets: 173 10*3/uL (ref 145–400)
WBC: 14.6 10*3/uL — ABNORMAL HIGH (ref 3.9–10.3)

## 2011-03-30 ENCOUNTER — Ambulatory Visit (INDEPENDENT_AMBULATORY_CARE_PROVIDER_SITE_OTHER): Payer: Medicare Other | Admitting: Internal Medicine

## 2011-03-30 ENCOUNTER — Encounter: Payer: Self-pay | Admitting: Internal Medicine

## 2011-03-30 DIAGNOSIS — Z8639 Personal history of other endocrine, nutritional and metabolic disease: Secondary | ICD-10-CM

## 2011-03-30 DIAGNOSIS — Z8673 Personal history of transient ischemic attack (TIA), and cerebral infarction without residual deficits: Secondary | ICD-10-CM

## 2011-03-30 DIAGNOSIS — IMO0002 Reserved for concepts with insufficient information to code with codable children: Secondary | ICD-10-CM

## 2011-03-30 DIAGNOSIS — I872 Venous insufficiency (chronic) (peripheral): Secondary | ICD-10-CM

## 2011-03-30 DIAGNOSIS — E559 Vitamin D deficiency, unspecified: Secondary | ICD-10-CM

## 2011-03-30 DIAGNOSIS — I871 Compression of vein: Secondary | ICD-10-CM

## 2011-03-30 DIAGNOSIS — B9689 Other specified bacterial agents as the cause of diseases classified elsewhere: Secondary | ICD-10-CM

## 2011-03-30 DIAGNOSIS — M8580 Other specified disorders of bone density and structure, unspecified site: Secondary | ICD-10-CM

## 2011-03-30 DIAGNOSIS — E785 Hyperlipidemia, unspecified: Secondary | ICD-10-CM

## 2011-03-30 DIAGNOSIS — M899 Disorder of bone, unspecified: Secondary | ICD-10-CM

## 2011-03-30 DIAGNOSIS — M949 Disorder of cartilage, unspecified: Secondary | ICD-10-CM

## 2011-03-30 DIAGNOSIS — E119 Type 2 diabetes mellitus without complications: Secondary | ICD-10-CM

## 2011-03-30 DIAGNOSIS — N390 Urinary tract infection, site not specified: Secondary | ICD-10-CM

## 2011-03-30 DIAGNOSIS — I10 Essential (primary) hypertension: Secondary | ICD-10-CM

## 2011-03-30 DIAGNOSIS — L109 Pemphigus, unspecified: Secondary | ICD-10-CM

## 2011-03-30 DIAGNOSIS — Z86718 Personal history of other venous thrombosis and embolism: Secondary | ICD-10-CM

## 2011-03-30 DIAGNOSIS — Z862 Personal history of diseases of the blood and blood-forming organs and certain disorders involving the immune mechanism: Secondary | ICD-10-CM

## 2011-03-30 DIAGNOSIS — A499 Bacterial infection, unspecified: Secondary | ICD-10-CM

## 2011-03-30 DIAGNOSIS — N3946 Mixed incontinence: Secondary | ICD-10-CM

## 2011-03-30 DIAGNOSIS — L102 Pemphigus foliaceous: Secondary | ICD-10-CM

## 2011-03-30 DIAGNOSIS — R609 Edema, unspecified: Secondary | ICD-10-CM

## 2011-03-30 LAB — COMPREHENSIVE METABOLIC PANEL
ALT: 26 U/L (ref 0–35)
AST: 20 U/L (ref 0–37)
Alkaline Phosphatase: 60 U/L (ref 39–117)
BUN: 36 mg/dL — ABNORMAL HIGH (ref 6–23)
Chloride: 110 mEq/L (ref 96–112)
Creat: 0.92 mg/dL (ref 0.50–1.10)
Potassium: 4.8 mEq/L (ref 3.5–5.3)
Sodium: 145 mEq/L (ref 135–145)
Total Bilirubin: 0.5 mg/dL (ref 0.3–1.2)

## 2011-03-31 LAB — CBC WITH DIFFERENTIAL/PLATELET
Eosinophils Relative: 1 % (ref 0–5)
HCT: 42.1 % (ref 36.0–46.0)
Lymphocytes Relative: 23 % (ref 12–46)
Lymphs Abs: 2.6 10*3/uL (ref 0.7–4.0)
MCH: 31.1 pg (ref 26.0–34.0)
MCV: 99.1 fL (ref 78.0–100.0)
Monocytes Absolute: 0.8 10*3/uL (ref 0.1–1.0)
RBC: 4.25 MIL/uL (ref 3.87–5.11)
RDW: 14.9 % (ref 11.5–15.5)
WBC: 11.5 10*3/uL — ABNORMAL HIGH (ref 4.0–10.5)

## 2011-04-03 ENCOUNTER — Other Ambulatory Visit (HOSPITAL_COMMUNITY): Payer: Self-pay | Admitting: Oncology

## 2011-04-03 ENCOUNTER — Encounter (HOSPITAL_BASED_OUTPATIENT_CLINIC_OR_DEPARTMENT_OTHER): Payer: Medicare Other | Admitting: Oncology

## 2011-04-03 DIAGNOSIS — L821 Other seborrheic keratosis: Secondary | ICD-10-CM

## 2011-04-03 DIAGNOSIS — L109 Pemphigus, unspecified: Secondary | ICD-10-CM

## 2011-04-03 DIAGNOSIS — Z5112 Encounter for antineoplastic immunotherapy: Secondary | ICD-10-CM

## 2011-04-03 DIAGNOSIS — L723 Sebaceous cyst: Secondary | ICD-10-CM

## 2011-04-03 LAB — COMPREHENSIVE METABOLIC PANEL
ALT: 35 U/L (ref 0–35)
AST: 23 U/L (ref 0–37)
Alkaline Phosphatase: 89 U/L (ref 39–117)
CO2: 21 mEq/L (ref 19–32)
Sodium: 142 mEq/L (ref 135–145)
Total Bilirubin: 0.3 mg/dL (ref 0.3–1.2)
Total Protein: 5.8 g/dL — ABNORMAL LOW (ref 6.0–8.3)

## 2011-04-03 LAB — CBC WITH DIFFERENTIAL/PLATELET
BASO%: 0.1 % (ref 0.0–2.0)
Basophils Absolute: 0 10*3/uL (ref 0.0–0.1)
EOS%: 1 % (ref 0.0–7.0)
HCT: 39.8 % (ref 34.8–46.6)
HGB: 13.3 g/dL (ref 11.6–15.9)
LYMPH%: 18.5 % (ref 14.0–49.7)
MCH: 31.3 pg (ref 25.1–34.0)
MCHC: 33.4 g/dL (ref 31.5–36.0)
MCV: 93.6 fL (ref 79.5–101.0)
NEUT%: 70.3 % (ref 38.4–76.8)
Platelets: 184 10*3/uL (ref 145–400)
lymph#: 1.9 10*3/uL (ref 0.9–3.3)

## 2011-04-03 LAB — LACTATE DEHYDROGENASE: LDH: 245 U/L (ref 94–250)

## 2011-04-10 ENCOUNTER — Other Ambulatory Visit: Payer: Self-pay | Admitting: Dermatology

## 2011-04-14 ENCOUNTER — Other Ambulatory Visit: Payer: Self-pay | Admitting: Internal Medicine

## 2011-04-18 NOTE — Progress Notes (Signed)
  Subjective:    Patient ID: Julie Mora, female    DOB: 07/03/1941, 70 y.o.   MRN: [id number]  HPI 70 year old white female with complex medical history. 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 In 2006 she had a left occipital parietal infarct and was maintained on Aggrenox initially. She was found to have hypertension and hyperlipidemia. She was found to be diabetic in 2006. She had Pneumovax immunization in 2006. Tetanus immunization 2006. Influenza immunization September 2011. In 2010 was diagnosed with pemphigus. Has been on steroids ever since. Glucoses been difficult to control at times and now requires Lantus insulin along with sliding scale NovoLog insulin. Is on Pradaxa. History of severe dependent edema. History of hypokalemia secondary to diuretic therapy. History of iron deficiency anemia which as corrected. Had deep venous thrombosis of right leg December 2010. Last mammogram on file February 2009. Or recently has been on weekly treatments of Rituxan 800 mg IV for pemphigus. Total of 4 treatments given. She is in need of some dental extractions in the near future. Issues surround how to manage her anticoagulation pre-and post dental extractions.  She is a widow. She's had several serious hospitalizations for septicemia having been transferred from hospital to nursing home for rehabilitation before returning to her own home. No children.  Father died at age 76  with pancreatic cancer. Mother died with complications of Alzheimer's dementia. One sister with diabetes and hypertension. No history of operations except for some remote surgery on her finger.    Review of Systems     Objective:   Physical Exam no carotid bruits; chest clear to auscultation; cardiac exam regular rate and rhythm; extremities 1-2+ pitting edema        Assessment &  Plan:  Pemphigus  Adult onset diabetes mellitus-insulin-dependent recent hemoglobin A1c slightly over 9%. Difficult to control diabetes while she is on steroids. Gave her and adjust his sliding scale today. May need to increase Lantus dose at bedtime.  Hypertension  Hyperlipidemia  Stress and urge urinary incontinence  History of urinary tract infections  Dependent edema  Remote history of left occipitoparietal stroke  History of DVT right leg 2010  History of iron deficiency anemia  History of vitamin D deficiency  History of GE reflux history of chronic anticoagulation therapy on Pradaxa  History of renal insufficiency  Patient is to return in 4 months or sooner if necessary. Dental extractions to be done in 4-6 weeks

## 2011-04-19 ENCOUNTER — Other Ambulatory Visit: Payer: Self-pay | Admitting: Internal Medicine

## 2011-04-19 DIAGNOSIS — E559 Vitamin D deficiency, unspecified: Secondary | ICD-10-CM | POA: Insufficient documentation

## 2011-04-19 DIAGNOSIS — IMO0002 Reserved for concepts with insufficient information to code with codable children: Secondary | ICD-10-CM | POA: Insufficient documentation

## 2011-04-19 DIAGNOSIS — Z8639 Personal history of other endocrine, nutritional and metabolic disease: Secondary | ICD-10-CM | POA: Insufficient documentation

## 2011-04-19 DIAGNOSIS — N3946 Mixed incontinence: Secondary | ICD-10-CM | POA: Insufficient documentation

## 2011-04-19 DIAGNOSIS — Z86718 Personal history of other venous thrombosis and embolism: Secondary | ICD-10-CM | POA: Insufficient documentation

## 2011-04-19 DIAGNOSIS — R609 Edema, unspecified: Secondary | ICD-10-CM | POA: Insufficient documentation

## 2011-04-22 ENCOUNTER — Other Ambulatory Visit: Payer: Self-pay | Admitting: Internal Medicine

## 2011-05-17 ENCOUNTER — Other Ambulatory Visit: Payer: Self-pay | Admitting: Internal Medicine

## 2011-06-06 ENCOUNTER — Other Ambulatory Visit: Payer: Self-pay | Admitting: Internal Medicine

## 2011-06-07 ENCOUNTER — Other Ambulatory Visit: Payer: Self-pay

## 2011-06-07 MED ORDER — POTASSIUM CHLORIDE ER 10 MEQ PO TBCR: 10.0000 meq | EXTENDED_RELEASE_TABLET | Freq: Two times a day (BID) | ORAL | Status: DC

## 2011-06-28 ENCOUNTER — Telehealth: Payer: Self-pay | Admitting: Internal Medicine

## 2011-06-28 NOTE — Telephone Encounter (Signed)
Pradaxa 75 mg # 60 phoned to [facility] [phone]) with prn refills for 1 year per Dr. [name].

## 2011-07-03 ENCOUNTER — Encounter: Payer: Self-pay | Admitting: Internal Medicine

## 2011-07-03 ENCOUNTER — Ambulatory Visit (INDEPENDENT_AMBULATORY_CARE_PROVIDER_SITE_OTHER): Payer: Medicare Other | Admitting: Internal Medicine

## 2011-07-03 DIAGNOSIS — Z86718 Personal history of other venous thrombosis and embolism: Secondary | ICD-10-CM

## 2011-07-03 DIAGNOSIS — Z7901 Long term (current) use of anticoagulants: Secondary | ICD-10-CM

## 2011-07-03 DIAGNOSIS — L109 Pemphigus, unspecified: Secondary | ICD-10-CM

## 2011-07-03 DIAGNOSIS — E119 Type 2 diabetes mellitus without complications: Secondary | ICD-10-CM

## 2011-07-03 DIAGNOSIS — Z8673 Personal history of transient ischemic attack (TIA), and cerebral infarction without residual deficits: Secondary | ICD-10-CM

## 2011-07-03 DIAGNOSIS — E785 Hyperlipidemia, unspecified: Secondary | ICD-10-CM

## 2011-07-03 DIAGNOSIS — I10 Essential (primary) hypertension: Secondary | ICD-10-CM

## 2011-07-03 DIAGNOSIS — Z23 Encounter for immunization: Secondary | ICD-10-CM

## 2011-07-03 LAB — HEMOGLOBIN A1C
Hgb A1c MFr Bld: 8.2 % — ABNORMAL HIGH (ref <5.7)
Mean Plasma Glucose: 189 mg/dL — ABNORMAL HIGH (ref <117)

## 2011-07-03 LAB — LIPID PANEL
HDL: 53 mg/dL (ref >39)
LDL Cholesterol: 121 mg/dL — ABNORMAL HIGH (ref 0–99)
Total CHOL/HDL Ratio: 3.7 Ratio
VLDL: 23 mg/dL (ref 0–40)

## 2011-07-03 LAB — HEPATIC FUNCTION PANEL
AST: 19 U/L (ref 0–37)
Albumin: 3.5 g/dL (ref 3.5–5.2)
Bilirubin, Direct: 0.1 mg/dL (ref 0.0–0.3)
Total Bilirubin: 0.4 mg/dL (ref 0.3–1.2)

## 2011-07-23 NOTE — Progress Notes (Signed)
  Subjective:    Patient ID: Julie Mora, female    DOB: [birth year], 70 y.o.   MRN: [id number]  HPI 70 year old white female with very complex medical problems including pemphigus treated with chronic steroid therapy, diabetes mellitus, hypertension, hyperlipidemia, history of stroke, history of deep venous thrombosis. Patient is now on Pradaxa and has tolerated it well without any problems but is very concerned and says that others have mentioned to her that she needs to be on Coumadin. She's concerned that there is no good treatment to reverse effects of Pradaxa if bleeding occurs. Her protimes were fairly hard to regulate previously and I think she is better off on Pradaxa given her numerous medical problems and complicated situation. She was supposed to have some dental work done but put that off apparently because of finances. Pradaxa will need to be discontinued before the procedure & I have sent oral surgeon instructions on how to do this and when to restart Pradaxa. Despite all the problems she looks fairly well. She is driving some & is trying to clean out her home. 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    Review of Systems     Objective:   Physical Exam chest is clear to auscultation; cardiac exam regular rate and rhythm; 1+ pitting edema.        Assessment & Plan:  Diabetes mellitus  Pemphigus  Hypertension  Hyperlipidemia  History of stroke  History of chronic steroid therapy for pemphigus  History of deep venous thrombosis  Plan: Patient is to return in 4 months for office visit in hemoglobin A 1C. She has learned to manage her diabetes with sliding scale insulin while on steroids. Encouraged her to follow a strict diabetic diet.

## 2011-07-23 NOTE — Patient Instructions (Signed)
Continue current regimen. Okay to proceed with dental work when you are ready. Oral surgeon has been sent instructions on how to manage your anticoagulation therapy pre-and post procedure. Return in 4 months for hemoglobin A1c, blood pressure check and further evaluation

## 2011-07-26 ENCOUNTER — Other Ambulatory Visit: Payer: Self-pay | Admitting: Internal Medicine

## 2011-07-27 ENCOUNTER — Other Ambulatory Visit: Payer: Self-pay

## 2011-07-27 MED ORDER — INSULIN ASPART 100 UNIT/ML ~~LOC~~ SOLN: 5.0000 [IU] | Freq: Three times a day (TID) | SUBCUTANEOUS | Status: DC

## 2011-09-03 ENCOUNTER — Other Ambulatory Visit: Payer: Self-pay | Admitting: Internal Medicine

## 2011-10-02 ENCOUNTER — Other Ambulatory Visit: Payer: Self-pay | Admitting: Dermatology

## 2011-10-10 DIAGNOSIS — Z961 Presence of intraocular lens: Secondary | ICD-10-CM | POA: Insufficient documentation

## 2011-10-10 DIAGNOSIS — H02109 Unspecified ectropion of unspecified eye, unspecified eyelid: Secondary | ICD-10-CM | POA: Insufficient documentation

## 2011-10-10 DIAGNOSIS — H16233 Neurotrophic keratoconjunctivitis, bilateral: Secondary | ICD-10-CM | POA: Insufficient documentation

## 2011-10-24 ENCOUNTER — Other Ambulatory Visit: Payer: Self-pay | Admitting: Internal Medicine

## 2011-11-03 ENCOUNTER — Ambulatory Visit (INDEPENDENT_AMBULATORY_CARE_PROVIDER_SITE_OTHER): Payer: Medicare Other | Admitting: Internal Medicine

## 2011-11-03 ENCOUNTER — Encounter: Payer: Self-pay | Admitting: Internal Medicine

## 2011-11-03 DIAGNOSIS — N390 Urinary tract infection, site not specified: Secondary | ICD-10-CM

## 2011-11-03 DIAGNOSIS — Z8673 Personal history of transient ischemic attack (TIA), and cerebral infarction without residual deficits: Secondary | ICD-10-CM

## 2011-11-03 DIAGNOSIS — IMO0002 Reserved for concepts with insufficient information to code with codable children: Secondary | ICD-10-CM

## 2011-11-03 DIAGNOSIS — IMO0001 Reserved for inherently not codable concepts without codable children: Secondary | ICD-10-CM

## 2011-11-03 DIAGNOSIS — I10 Essential (primary) hypertension: Secondary | ICD-10-CM

## 2011-11-03 DIAGNOSIS — D649 Anemia, unspecified: Secondary | ICD-10-CM

## 2011-11-03 DIAGNOSIS — E119 Type 2 diabetes mellitus without complications: Secondary | ICD-10-CM

## 2011-11-03 DIAGNOSIS — R5381 Other malaise: Secondary | ICD-10-CM

## 2011-11-03 DIAGNOSIS — Z86718 Personal history of other venous thrombosis and embolism: Secondary | ICD-10-CM

## 2011-11-03 DIAGNOSIS — L109 Pemphigus, unspecified: Secondary | ICD-10-CM

## 2011-11-03 DIAGNOSIS — Z8619 Personal history of other infectious and parasitic diseases: Secondary | ICD-10-CM

## 2011-11-03 DIAGNOSIS — E1065 Type 1 diabetes mellitus with hyperglycemia: Secondary | ICD-10-CM

## 2011-11-03 DIAGNOSIS — R5383 Other fatigue: Secondary | ICD-10-CM

## 2011-11-03 DIAGNOSIS — E785 Hyperlipidemia, unspecified: Secondary | ICD-10-CM

## 2011-11-03 LAB — COMPREHENSIVE METABOLIC PANEL
ALT: 13 U/L (ref 0–35)
AST: 18 U/L (ref 0–37)
BUN: 15 mg/dL (ref 6–23)
Calcium: 9.4 mg/dL (ref 8.4–10.5)
Chloride: 107 mEq/L (ref 96–112)
Creat: 0.96 mg/dL (ref 0.50–1.10)
Total Bilirubin: 0.4 mg/dL (ref 0.3–1.2)

## 2011-11-03 LAB — CBC WITH DIFFERENTIAL/PLATELET
Basophils Absolute: 0 10*3/uL (ref 0.0–0.1)
Basophils Relative: 0 % (ref 0–1)
Eosinophils Absolute: 0.6 10*3/uL (ref 0.0–0.7)
Eosinophils Relative: 7 % — ABNORMAL HIGH (ref 0–5)
HCT: 43.7 % (ref 36.0–46.0)
Lymphocytes Relative: 21 % (ref 12–46)
MCH: 30.8 pg (ref 26.0–34.0)
MCHC: 32.3 g/dL (ref 30.0–36.0)
MCV: 95.4 fL (ref 78.0–100.0)
Monocytes Absolute: 0.8 10*3/uL (ref 0.1–1.0)
Platelets: 247 10*3/uL (ref 150–400)
RDW: 13.5 % (ref 11.5–15.5)
WBC: 8.8 10*3/uL (ref 4.0–10.5)

## 2011-11-03 LAB — TSH: TSH: 2.255 u[IU]/mL (ref 0.350–4.500)

## 2011-11-14 ENCOUNTER — Telehealth: Payer: Self-pay | Admitting: Internal Medicine

## 2011-11-16 ENCOUNTER — Encounter: Payer: Self-pay | Admitting: Oncology

## 2011-11-16 NOTE — Progress Notes (Unsigned)
02/14/11 WENT ON MEDICARE'S WEBSITE AND COULD NOT SEE THAT IT WOULD BE COVERED. 02/15/11 I CALL [facility] AND KELLY DAVIS SAID TO COMPLETE THE ARTHRITIS PAPERS. I COMPLETED FORM FOR RITUXAN AND DR. Arline Asp SIGNED IT. 02/17/11 Nevea COULDN'T COME IN AS SHE WAS GOING TO BE AT [facility] ALL DAY.  I MAILED HER THE PAPERS TO SIGN THE PAPERS AND SEND IN HER INCOME INFORMATION. 02/27/11 RECEIVED THE PAPERS BACK FROM Stuart AND I FAXED ALL THE PAPERS TO [facility]. FAX BACK FROM KELLY DAVIS HER PATIENT ID IS [id number] AND THE                CASE ID IS [id number]. 02/28/11 LETTER FROM [facility] STATING SHE APPEARS TO QUALIFY MEDICALLY AND FINANCIALLY FOR DRUG REPLACEMENT IF HER INSURANCE DOES NOT PAY.

## 2011-11-16 NOTE — Telephone Encounter (Signed)
Labs reviewed with patient. Needs to get glucose under better control, and she is aware of this.

## 2011-11-26 NOTE — Patient Instructions (Signed)
Continue same medications and return in 4 months. Please watch diet. Try to keep diabetes under better control.

## 2011-11-26 NOTE — Progress Notes (Signed)
  Subjective:    Patient ID: Julie Mora, female    DOB: Jun 18, 1941, 71 y.o.   MRN: [id number]  HPI  followup visit for this 71 year old white female with insulin-dependent diabetes, hypertension, vitamin D deficiency, hyperlipidemia, stage II chronic kidney disease, Pemphigus treated with steroids aggravating her diabetes control, stress and urge urinary incontinence, history of cerebral vascular accident left occipital parietal infarct August 2006. History of DVT right leg December 2010. Currently on anticoagulation therapy with Prozac so. Patient is to not please to be on Primaxin. She wants to be on Coumadin. However given her multiple medical problems I thought per [facility] so was a better choice in that she wouldn't have to have frequent protimes drawn and we wouldn't have to be concerned with noncompliance with Coumadin et Karie Soda. We've had a good deal of compliance issues with diet surrounding diabetes mellitus and her diabetic control. However if she wants to pursue being on Coumadin therapy I have suggested she go to Coumadin clinic at [facility]. She plans to have some dental work done in the near future. I have sent corresponce to her oral surgeon Dr. Bradly Chris regarding how to manage for DACs a surrounding dental extractions. Pemphigus was initially diagnosed in 2010. She went into remission on high-dose steroid therapy. She's been under the excellent care of Dr. [name]. However, she had a relapse of the pemphigus and now is on every other day steroid therapy. Has been up to 80 mg daily in 2011. Is down now to 15 mg every other day. Was hospitalized in March 2012 with staphylococcal aureus and Proteus mirabilis bacteremia with septicemia. History of recurrent urinary tract infections.    Review of Systems     Objective:   Physical Exam chest clear to auscultation; cardiac exam regular rate and rhythm exam; extremities trace lower extremity edema.        Assessment & Plan:   Pemphigus  Insulin-dependent diabetes mellitus  Hypertension  Hyperlipidemia  Vitamin D deficiency  Stage II chronic kidney disease  History of cerebral vascular accident left occipital parietal lobe infarct August 2006  History of DVT left leg December 2010  Plan: Fasting labs are drawn. Patient will return in 4 months.

## 2011-12-07 ENCOUNTER — Other Ambulatory Visit: Payer: Self-pay | Admitting: Internal Medicine

## 2011-12-24 ENCOUNTER — Other Ambulatory Visit: Payer: Self-pay | Admitting: Internal Medicine

## 2012-01-21 ENCOUNTER — Other Ambulatory Visit: Payer: Self-pay | Admitting: Internal Medicine

## 2012-01-22 ENCOUNTER — Other Ambulatory Visit: Payer: Self-pay

## 2012-01-22 MED ORDER — INSULIN GLARGINE 100 UNIT/ML ~~LOC~~ SOLN: 20.0000 [IU] | Freq: Every day | SUBCUTANEOUS | Status: DC

## 2012-01-29 ENCOUNTER — Telehealth: Payer: Self-pay | Admitting: Internal Medicine

## 2012-02-16 ENCOUNTER — Other Ambulatory Visit: Payer: Medicare Other | Admitting: Internal Medicine

## 2012-02-16 DIAGNOSIS — E785 Hyperlipidemia, unspecified: Secondary | ICD-10-CM

## 2012-02-16 DIAGNOSIS — IMO0001 Reserved for inherently not codable concepts without codable children: Secondary | ICD-10-CM

## 2012-02-16 DIAGNOSIS — Z79899 Other long term (current) drug therapy: Secondary | ICD-10-CM

## 2012-02-16 LAB — LIPID PANEL
Cholesterol: 141 mg/dL (ref 0–200)
Total CHOL/HDL Ratio: 3.4 Ratio
Triglycerides: 137 mg/dL (ref <150)

## 2012-02-16 LAB — HEPATIC FUNCTION PANEL
ALT: 13 U/L (ref 0–35)
AST: 17 U/L (ref 0–37)
Bilirubin, Direct: 0.1 mg/dL (ref 0.0–0.3)
Indirect Bilirubin: 0.5 mg/dL (ref 0.0–0.9)
Total Protein: 5.8 g/dL — ABNORMAL LOW (ref 6.0–8.3)

## 2012-02-20 ENCOUNTER — Ambulatory Visit: Payer: Medicare Other | Admitting: Internal Medicine

## 2012-02-23 ENCOUNTER — Encounter: Payer: Self-pay | Admitting: Internal Medicine

## 2012-02-23 ENCOUNTER — Other Ambulatory Visit: Payer: Self-pay | Admitting: Internal Medicine

## 2012-02-23 ENCOUNTER — Ambulatory Visit (INDEPENDENT_AMBULATORY_CARE_PROVIDER_SITE_OTHER): Payer: Medicare Other | Admitting: Internal Medicine

## 2012-02-23 VITALS — BP 126/84 | HR 80 | Temp 98.7°F | Wt 262.0 lb

## 2012-02-23 DIAGNOSIS — Z862 Personal history of diseases of the blood and blood-forming organs and certain disorders involving the immune mechanism: Secondary | ICD-10-CM

## 2012-02-23 DIAGNOSIS — E785 Hyperlipidemia, unspecified: Secondary | ICD-10-CM

## 2012-02-23 DIAGNOSIS — Z8673 Personal history of transient ischemic attack (TIA), and cerebral infarction without residual deficits: Secondary | ICD-10-CM

## 2012-02-23 DIAGNOSIS — Z8639 Personal history of other endocrine, nutritional and metabolic disease: Secondary | ICD-10-CM

## 2012-02-23 DIAGNOSIS — E119 Type 2 diabetes mellitus without complications: Secondary | ICD-10-CM

## 2012-02-23 DIAGNOSIS — R609 Edema, unspecified: Secondary | ICD-10-CM

## 2012-02-23 DIAGNOSIS — E8881 Metabolic syndrome: Secondary | ICD-10-CM

## 2012-02-23 DIAGNOSIS — E1165 Type 2 diabetes mellitus with hyperglycemia: Secondary | ICD-10-CM

## 2012-02-23 DIAGNOSIS — I10 Essential (primary) hypertension: Secondary | ICD-10-CM

## 2012-02-23 DIAGNOSIS — E669 Obesity, unspecified: Secondary | ICD-10-CM

## 2012-02-23 DIAGNOSIS — Z86718 Personal history of other venous thrombosis and embolism: Secondary | ICD-10-CM

## 2012-02-23 DIAGNOSIS — Z92241 Personal history of systemic steroid therapy: Secondary | ICD-10-CM

## 2012-02-23 DIAGNOSIS — L109 Pemphigus, unspecified: Secondary | ICD-10-CM

## 2012-02-23 DIAGNOSIS — IMO0001 Reserved for inherently not codable concepts without codable children: Secondary | ICD-10-CM

## 2012-03-09 ENCOUNTER — Other Ambulatory Visit: Payer: Self-pay | Admitting: Internal Medicine

## 2012-03-11 ENCOUNTER — Other Ambulatory Visit: Payer: Self-pay

## 2012-03-11 MED ORDER — SIMVASTATIN 20 MG PO TABS: 20.0000 mg | ORAL_TABLET | Freq: Every day | ORAL | Status: DC

## 2012-03-14 ENCOUNTER — Telehealth: Payer: Self-pay | Admitting: Internal Medicine

## 2012-03-16 ENCOUNTER — Other Ambulatory Visit: Payer: Self-pay | Admitting: Internal Medicine

## 2012-03-17 ENCOUNTER — Encounter: Payer: Self-pay | Admitting: Internal Medicine

## 2012-03-17 NOTE — Patient Instructions (Addendum)
Increase Lantus insulin at bedtime to 25-30 units at bedtime. Please watch  diet. Return in 4 months.

## 2012-03-17 NOTE — Progress Notes (Signed)
  Subjective:    Patient ID: Julie Mora, female    DOB: 06-28-1941, 71 y.o.   MRN: [id number]  HPI   71 year old white female with pemphigus treated with chronic steroid therapy currently 15 mg of prednisone every other day, diabetes mellitus, history of iron deficiency, hypertension, adult onset diabetes mellitus, hyperlipidemia, dependent edema, history of right leg DVT December 2010, history of left occipital parietal lobe infarction August 2006 for followup of multiple medical problems. 3 months ago her hemoglobin A1c was 8.5%. Now it is 9%. Not sure that patient is following a strict diabetic diet. Fasting lipid panel is within normal limits. Main plasma glucoses approximately 212. Spoke with patient about diet. Really not able to exercise because of obesity and ambulation issues. She is on chronic anticoagulation therapy. She has expressed a desire to be placed on Coumadin instead of Pradaxa. Referred her to Shelby Dubin at [facility] to discuss this issue. She plans on having some dental work done in the future. Significant issues with dependent edema despite torsemide 40 mg daily.  Had been density study done December 2011. Pneumovax immunization December 2006. Tetanus immunization December 2006. She uses Lantus insulin HES and has a sliding scale for NovoLog. Has been doing 20 units of Lantus at bedtime but I think needs to increase Lantus to 25 or 30 units at bedtime. Would like to see hemoglobin A1c less than 7%.  She is a widow. Says Dr. [name]  would like to have pemphigus antibodies drawn. We can do this through our lab. Patient reminded regarding diabetic eye exam on a yearly basis.    Review of Systems     Objective:   Physical Exam she is alert and oriented x3. Skin is warm and dry. Neck supple without thyromegaly JVD or carotid bruits. Chest clear to auscultation. Cardiac exam regular rate and rhythm. Extremities 1+ pitting edema. Superficial varicosities both lower  extremities. Diabetic foot exam shows no ulcers. Unable to palpate posterior tibial pulses.        Assessment & Plan:  Poorly controlled diabetes mellitus insulin-dependent  Chronic steroid therapy due to pemphigus  Pemphigus  Hypertension  Hyperlipidemia  History of iron deficiency  History of DVT  History of stroke  Stress and urge urinary incontinence  Metabolic syndrome  Plan: Patient is to return in 4-6 months for office visit, hemoglobin A1c and evaluation of multiple medical problems which are difficult to manage. Continue same medications. Increase Lantus to 25-30 units at bedtime. Reminded about yearly diabetic eye exam.

## 2012-03-19 NOTE — Telephone Encounter (Signed)
Received [facility] paperwork and signed and faxed back.

## 2012-04-04 ENCOUNTER — Telehealth: Payer: Self-pay | Admitting: Internal Medicine

## 2012-04-05 NOTE — Telephone Encounter (Signed)
Spoke with patient/explained reason for referral to [facility] for management of diet at home. Patient has no top teeth, therefore can only eat soft foods. Has a woman who shops for her, but many meals are frozen. States she eats a balanced diet, as much as possible, also has very poor vision. Will see Vena Austria next week.

## 2012-04-06 ENCOUNTER — Other Ambulatory Visit: Payer: Self-pay | Admitting: Internal Medicine

## 2012-04-13 ENCOUNTER — Other Ambulatory Visit: Payer: Self-pay | Admitting: Internal Medicine

## 2012-04-15 ENCOUNTER — Other Ambulatory Visit: Payer: Self-pay | Admitting: Dermatology

## 2012-04-22 ENCOUNTER — Telehealth: Payer: Self-pay | Admitting: Internal Medicine

## 2012-04-24 ENCOUNTER — Other Ambulatory Visit: Payer: Self-pay

## 2012-04-24 MED ORDER — INSULIN GLARGINE 100 UNIT/ML ~~LOC~~ SOLN: 30.0000 [IU] | Freq: Every day | SUBCUTANEOUS | Status: DC

## 2012-04-24 NOTE — Telephone Encounter (Signed)
New rx sent to pharmacy

## 2012-05-02 ENCOUNTER — Telehealth: Payer: Self-pay | Admitting: Internal Medicine

## 2012-05-02 NOTE — Telephone Encounter (Signed)
This is up to the opthalmologist. She should call them. Sometimes blood thinner is not stopped like it used to be but they decide this- not me.

## 2012-05-03 DIAGNOSIS — Z8673 Personal history of transient ischemic attack (TIA), and cerebral infarction without residual deficits: Secondary | ICD-10-CM | POA: Insufficient documentation

## 2012-05-06 NOTE — Telephone Encounter (Signed)
LMOM for pt of MJB's message r/t contacting the eye Dr. Regarding her Pradaxa and whether to stop it or continue.  Pt instructed in message to contact her eyes surgeon/doc and if she has any further questions to call us back.

## 2012-05-27 ENCOUNTER — Other Ambulatory Visit: Payer: Medicare Other | Admitting: Internal Medicine

## 2012-05-27 DIAGNOSIS — I10 Essential (primary) hypertension: Secondary | ICD-10-CM

## 2012-05-27 DIAGNOSIS — Z79899 Other long term (current) drug therapy: Secondary | ICD-10-CM

## 2012-05-27 DIAGNOSIS — E785 Hyperlipidemia, unspecified: Secondary | ICD-10-CM

## 2012-05-27 DIAGNOSIS — IMO0001 Reserved for inherently not codable concepts without codable children: Secondary | ICD-10-CM

## 2012-05-27 LAB — HEPATIC FUNCTION PANEL
AST: 21 U/L (ref 0–37)
Albumin: 3.5 g/dL (ref 3.5–5.2)
Alkaline Phosphatase: 63 U/L (ref 39–117)
Total Protein: 6 g/dL (ref 6.0–8.3)

## 2012-05-27 LAB — BASIC METABOLIC PANEL
BUN: 17 mg/dL (ref 6–23)
Calcium: 9.4 mg/dL (ref 8.4–10.5)
Glucose, Bld: 164 mg/dL — ABNORMAL HIGH (ref 70–99)

## 2012-05-27 LAB — LIPID PANEL
HDL: 44 mg/dL (ref >39)
LDL Cholesterol: 66 mg/dL (ref 0–99)
Total CHOL/HDL Ratio: 3 Ratio
Triglycerides: 119 mg/dL (ref <150)

## 2012-05-27 LAB — HEMOGLOBIN A1C: Hgb A1c MFr Bld: 8 % — ABNORMAL HIGH (ref <5.7)

## 2012-06-03 ENCOUNTER — Encounter: Payer: Self-pay | Admitting: Internal Medicine

## 2012-06-03 ENCOUNTER — Ambulatory Visit (INDEPENDENT_AMBULATORY_CARE_PROVIDER_SITE_OTHER): Payer: Medicare Other | Admitting: Internal Medicine

## 2012-06-03 VITALS — BP 136/84 | HR 80 | Temp 99.0°F | Wt 264.0 lb

## 2012-06-03 DIAGNOSIS — L109 Pemphigus, unspecified: Secondary | ICD-10-CM

## 2012-06-03 DIAGNOSIS — E669 Obesity, unspecified: Secondary | ICD-10-CM

## 2012-06-03 DIAGNOSIS — I10 Essential (primary) hypertension: Secondary | ICD-10-CM

## 2012-06-03 DIAGNOSIS — IMO0001 Reserved for inherently not codable concepts without codable children: Secondary | ICD-10-CM

## 2012-06-03 DIAGNOSIS — E119 Type 2 diabetes mellitus without complications: Secondary | ICD-10-CM

## 2012-06-03 DIAGNOSIS — Z23 Encounter for immunization: Secondary | ICD-10-CM

## 2012-06-03 DIAGNOSIS — Z7901 Long term (current) use of anticoagulants: Secondary | ICD-10-CM

## 2012-06-03 NOTE — Patient Instructions (Addendum)
Influenza given today. Continue same meds and return January

## 2012-06-03 NOTE — Progress Notes (Signed)
  Subjective:    Patient ID: Julie Mora, female    DOB: 11/12/1940, 71 y.o.   MRN: [id number]  HPI  For follow up of HTN, Hyperlipidemia and DM who is also on oral steroids by Dr. [name]  for Pemphigus. I am  pleased with lipid panel and liver functions which are normal. Pleased with Hgb AIC which  improved from 9% to 8%. Had Cataract extraction and lens implant right eye Sept 23rd and to have left in Oct 15th.  Waiting on dental work. Give influenza vaccine today. Has had pneumovax and Zostavax vaccine. Is on Prednisone 12.5 mg every other day for Pemphigus. Feels well.  B-met is WNL.  Will see Tafeen after the first of the year.  Has been 1.5 years since series of 4 infusion treatments for Pemphigus done through Dr. Arline Asp.    Review of Systems     Objective:   Physical Exam  Chest is clear to auscultation. Cor RRR  Ext trace edema.        Assessment & Plan:  Diabetes mellitus insulin-dependent  Hypertension  Hyperlipidemia  Pemphigus treated with oral steroids    Cataract left eye to be removed October 15th  Plan: Return in 4 months. Influenza immunization given today. Continue same medications. Try to watch diet.

## 2012-06-21 ENCOUNTER — Other Ambulatory Visit: Payer: Self-pay | Admitting: Internal Medicine

## 2012-07-01 ENCOUNTER — Telehealth: Payer: Self-pay | Admitting: Internal Medicine

## 2012-07-01 NOTE — Telephone Encounter (Signed)
Patient scheduled to have multiple teeth removed and alveloplasty with IV sedation in the office.

## 2012-07-17 ENCOUNTER — Other Ambulatory Visit: Payer: Self-pay | Admitting: Internal Medicine

## 2012-07-18 ENCOUNTER — Other Ambulatory Visit: Payer: Self-pay

## 2012-07-18 MED ORDER — INSULIN PEN NEEDLE 31G X 5 MM MISC: 10.0000 [IU] | Freq: Every day | Status: DC

## 2012-07-22 ENCOUNTER — Other Ambulatory Visit: Payer: Self-pay | Admitting: Internal Medicine

## 2012-07-23 ENCOUNTER — Other Ambulatory Visit: Payer: Self-pay

## 2012-07-23 MED ORDER — DABIGATRAN ETEXILATE MESYLATE 75 MG PO CAPS: 75.0000 mg | ORAL_CAPSULE | Freq: Two times a day (BID) | ORAL | Status: DC

## 2012-08-14 ENCOUNTER — Telehealth: Payer: Self-pay

## 2012-08-14 NOTE — Telephone Encounter (Signed)
Patient reports awakening in the middle of the night with nausea, vomiting and pain in upper back between shoulder blades. Took some Tylenol with little relief. Advised per Dr. [name] to go to ED for evaluation. She is reluctant, but will call her sister to drive her.

## 2012-08-16 ENCOUNTER — Other Ambulatory Visit: Payer: Self-pay | Admitting: Internal Medicine

## 2012-10-10 ENCOUNTER — Other Ambulatory Visit: Payer: Medicare Other | Admitting: Internal Medicine

## 2012-10-17 ENCOUNTER — Ambulatory Visit: Payer: Medicare Other | Admitting: Internal Medicine

## 2012-10-18 DIAGNOSIS — Z79899 Other long term (current) drug therapy: Secondary | ICD-10-CM

## 2012-10-22 ENCOUNTER — Other Ambulatory Visit: Payer: Medicare Other | Admitting: Internal Medicine

## 2012-10-22 DIAGNOSIS — E119 Type 2 diabetes mellitus without complications: Secondary | ICD-10-CM

## 2012-10-22 DIAGNOSIS — E785 Hyperlipidemia, unspecified: Secondary | ICD-10-CM

## 2012-10-22 DIAGNOSIS — Z79899 Other long term (current) drug therapy: Secondary | ICD-10-CM

## 2012-10-22 LAB — HEPATIC FUNCTION PANEL
AST: 66 U/L — ABNORMAL HIGH (ref 0–37)
Bilirubin, Direct: 0.2 mg/dL (ref 0.0–0.3)
Total Bilirubin: 0.5 mg/dL (ref 0.3–1.2)

## 2012-10-22 LAB — HEMOGLOBIN A1C
Hgb A1c MFr Bld: 7.1 % — ABNORMAL HIGH (ref <5.7)
Mean Plasma Glucose: 157 mg/dL — ABNORMAL HIGH (ref <117)

## 2012-10-22 LAB — LIPID PANEL
LDL Cholesterol: 66 mg/dL (ref 0–99)
Total CHOL/HDL Ratio: 2.8 Ratio
VLDL: 17 mg/dL (ref 0–40)

## 2012-10-24 ENCOUNTER — Ambulatory Visit (INDEPENDENT_AMBULATORY_CARE_PROVIDER_SITE_OTHER): Payer: Medicare Other | Admitting: Internal Medicine

## 2012-10-24 ENCOUNTER — Encounter: Payer: Self-pay | Admitting: Internal Medicine

## 2012-10-24 VITALS — BP 136/92 | HR 92 | Temp 98.5°F | Ht 64.0 in | Wt 265.0 lb

## 2012-10-24 DIAGNOSIS — R7401 Elevation of levels of liver transaminase levels: Secondary | ICD-10-CM

## 2012-10-24 DIAGNOSIS — E119 Type 2 diabetes mellitus without complications: Secondary | ICD-10-CM

## 2012-10-24 DIAGNOSIS — R82998 Other abnormal findings in urine: Secondary | ICD-10-CM

## 2012-10-24 DIAGNOSIS — E8881 Metabolic syndrome: Secondary | ICD-10-CM

## 2012-10-24 DIAGNOSIS — R748 Abnormal levels of other serum enzymes: Secondary | ICD-10-CM

## 2012-10-24 LAB — POCT URINALYSIS DIPSTICK
Glucose, UA: NEGATIVE
Urobilinogen, UA: NEGATIVE

## 2012-10-24 NOTE — Progress Notes (Signed)
  Subjective:    Patient ID: Julie Mora, female    DOB: 08/30/1941, 72 y.o.   MRN: [id number]  HPI  72 year old White female with insulin-dependent diabetes, hypertension, hyperlipidemia, dependent edema, recurrent urinary tract infections, history of CVA, history of DVT, history of iron deficiency, history of stress and urge urinary incontinence, history of pemphigus treated with steroids per Dr. [name]. She recently had several dental extractions done by oral surgeon. She was off prednisone for that period of time and now is on 5 mg of prednisone every other day. Admits to not following a strict diabetic diet. She is on Pradaxa for history of CVA and DVT. She is obese. Overall she is doing pretty well. Pemphigus is under much better control and she's been able to wean down on her prednisone.    Review of Systems     Objective:   Physical Exam skin is warm and dry. Neck is supple without JVD thyromegaly or carotid bruits. Chest clear to auscultation. Cardiac exam regular rate and rhythm normal S1 and S2. Extremities: trace pitting edema lower extremities. Alert and oriented x3. Affect is cheerful        Assessment & Plan:  Pemphigus-stable on every other day prednisone  Mixed stress and urge urinary incontinence  History of recurrent urinary tract infections-appears to have urinary tract infection today based on urinalysis-- culture will be obtained  Hypertension-stable  History of CVA  History of DVT  Insulin-dependent diabetes-hemoglobin A1c 7.1% which is very good for her. She has microalbuminuria with result 6.80. Needs to watch her diet.  History of iron deficiency  Hyperlipidemia-stable on statin therapy and within normal limits  Obesity  Metabolic syndrome  Plan: Return in 4 months for hemoglobin A1c and followup. Urine culture is pending and further instructions to follow.    Plan: Urine culture pending.

## 2012-10-25 LAB — MICROALBUMIN, URINE: Microalb, Ur: 6.8 mg/dL — ABNORMAL HIGH (ref 0.00–1.89)

## 2012-10-26 LAB — URINE CULTURE: Colony Count: >=100000

## 2012-10-28 ENCOUNTER — Other Ambulatory Visit: Payer: Self-pay

## 2012-10-28 MED ORDER — CIPROFLOXACIN HCL 250 MG PO TABS: 250.0000 mg | ORAL_TABLET | Freq: Two times a day (BID) | ORAL | Status: DC

## 2012-10-28 NOTE — Progress Notes (Signed)
Patient informed. Rx sent to [facility]. 

## 2012-11-06 ENCOUNTER — Other Ambulatory Visit: Payer: Self-pay

## 2012-11-06 MED ORDER — SIMVASTATIN 20 MG PO TABS: 20.0000 mg | ORAL_TABLET | Freq: Every day | ORAL | Status: DC

## 2012-11-11 ENCOUNTER — Other Ambulatory Visit (INDEPENDENT_AMBULATORY_CARE_PROVIDER_SITE_OTHER): Payer: Medicare Other | Admitting: Internal Medicine

## 2012-11-11 DIAGNOSIS — N390 Urinary tract infection, site not specified: Secondary | ICD-10-CM

## 2012-11-11 LAB — POCT URINALYSIS DIPSTICK
Bilirubin, UA: NEGATIVE
Glucose, UA: NEGATIVE
Nitrite, UA: NEGATIVE
Spec Grav, UA: 1.01
Urobilinogen, UA: NEGATIVE

## 2012-11-14 ENCOUNTER — Other Ambulatory Visit: Payer: Self-pay

## 2012-11-14 DIAGNOSIS — N390 Urinary tract infection, site not specified: Secondary | ICD-10-CM

## 2012-11-14 LAB — URINE CULTURE: Colony Count: >=100000

## 2012-11-14 MED ORDER — CEPHALEXIN 500 MG PO TABS: 500.0000 mg | ORAL_TABLET | Freq: Four times a day (QID) | ORAL | Status: DC

## 2012-11-17 DIAGNOSIS — E8881 Metabolic syndrome: Secondary | ICD-10-CM | POA: Insufficient documentation

## 2012-11-17 DIAGNOSIS — R748 Abnormal levels of other serum enzymes: Secondary | ICD-10-CM | POA: Insufficient documentation

## 2012-11-17 NOTE — Patient Instructions (Addendum)
Continue same medications and return in 4 months. Watch diet. You seem to have a urinary tract infection. Culture will be obtained.

## 2012-11-20 ENCOUNTER — Telehealth: Payer: Self-pay | Admitting: Internal Medicine

## 2012-11-20 DIAGNOSIS — IMO0001 Reserved for inherently not codable concepts without codable children: Secondary | ICD-10-CM

## 2012-11-20 MED ORDER — INSULIN ASPART 100 UNIT/ML ~~LOC~~ SOLN: 5.0000 [IU] | Freq: Two times a day (BID) | SUBCUTANEOUS | Status: DC | PRN

## 2012-11-20 NOTE — Telephone Encounter (Signed)
Please refill for one year  

## 2012-11-20 NOTE — Telephone Encounter (Signed)
Pt called saying she needed refills on Novolog.  Says she is using 5-12 units before breakfast and supper. Refill Novolg flexpen for one year with these instruuctions .

## 2012-11-25 ENCOUNTER — Other Ambulatory Visit: Payer: Medicare Other | Admitting: Internal Medicine

## 2012-11-28 ENCOUNTER — Other Ambulatory Visit: Payer: Medicare Other | Admitting: Internal Medicine

## 2012-12-03 ENCOUNTER — Other Ambulatory Visit: Payer: Medicare Other | Admitting: Internal Medicine

## 2012-12-05 ENCOUNTER — Other Ambulatory Visit (INDEPENDENT_AMBULATORY_CARE_PROVIDER_SITE_OTHER): Payer: Medicare Other | Admitting: Internal Medicine

## 2012-12-05 DIAGNOSIS — R6889 Other general symptoms and signs: Secondary | ICD-10-CM

## 2012-12-05 DIAGNOSIS — N390 Urinary tract infection, site not specified: Secondary | ICD-10-CM

## 2012-12-05 LAB — POCT URINALYSIS DIPSTICK
Leukocytes, UA: NEGATIVE
Nitrite, UA: NEGATIVE
Urobilinogen, UA: NEGATIVE

## 2012-12-05 LAB — HEPATIC FUNCTION PANEL
ALT: 12 U/L (ref 0–35)
Albumin: 3.6 g/dL (ref 3.5–5.2)
Total Protein: 6.1 g/dL (ref 6.0–8.3)

## 2013-03-03 DIAGNOSIS — H179 Unspecified corneal scar and opacity: Secondary | ICD-10-CM | POA: Insufficient documentation

## 2013-03-13 ENCOUNTER — Other Ambulatory Visit: Payer: Medicare Other | Admitting: Internal Medicine

## 2013-03-13 DIAGNOSIS — E119 Type 2 diabetes mellitus without complications: Secondary | ICD-10-CM

## 2013-03-13 LAB — HEMOGLOBIN A1C: Hgb A1c MFr Bld: 7.4 % — ABNORMAL HIGH (ref <5.7)

## 2013-03-14 ENCOUNTER — Ambulatory Visit (INDEPENDENT_AMBULATORY_CARE_PROVIDER_SITE_OTHER): Payer: Medicare Other | Admitting: Internal Medicine

## 2013-03-14 VITALS — BP 114/80 | HR 84 | Temp 97.4°F | Wt 258.0 lb

## 2013-03-14 DIAGNOSIS — Z7901 Long term (current) use of anticoagulants: Secondary | ICD-10-CM

## 2013-03-14 DIAGNOSIS — E669 Obesity, unspecified: Secondary | ICD-10-CM

## 2013-03-14 DIAGNOSIS — L109 Pemphigus, unspecified: Secondary | ICD-10-CM

## 2013-03-14 DIAGNOSIS — IMO0001 Reserved for inherently not codable concepts without codable children: Secondary | ICD-10-CM

## 2013-03-14 DIAGNOSIS — R609 Edema, unspecified: Secondary | ICD-10-CM

## 2013-03-14 DIAGNOSIS — E8881 Metabolic syndrome: Secondary | ICD-10-CM

## 2013-03-14 DIAGNOSIS — Z86718 Personal history of other venous thrombosis and embolism: Secondary | ICD-10-CM

## 2013-03-14 DIAGNOSIS — Z8673 Personal history of transient ischemic attack (TIA), and cerebral infarction without residual deficits: Secondary | ICD-10-CM

## 2013-03-14 DIAGNOSIS — Z87440 Personal history of urinary (tract) infections: Secondary | ICD-10-CM

## 2013-03-14 DIAGNOSIS — E119 Type 2 diabetes mellitus without complications: Secondary | ICD-10-CM

## 2013-03-14 DIAGNOSIS — Z794 Long term (current) use of insulin: Secondary | ICD-10-CM

## 2013-03-14 DIAGNOSIS — I10 Essential (primary) hypertension: Secondary | ICD-10-CM

## 2013-03-14 DIAGNOSIS — E785 Hyperlipidemia, unspecified: Secondary | ICD-10-CM

## 2013-03-14 NOTE — Patient Instructions (Addendum)
Continue diet and exercise and return in 4 months

## 2013-03-17 ENCOUNTER — Encounter: Payer: Self-pay | Admitting: Internal Medicine

## 2013-03-17 NOTE — Progress Notes (Signed)
  Subjective:    Patient ID: Julie Mora, female    DOB: 1941-05-05, 72 y.o.   MRN: [id number]  HPI  In  for 4  month followup visit on  this 72 year old White female  with history of pemphigus now on prednisone 5 mg daily. History of insulin-dependent diabetes. Admits to not following a strict diabetic diet and hemoglobin A1c of 7.4% reflects control not to be as well as previous result of 7.1% in February.. She still has issues with dependent edema and doesn't take her diuretic daily. This is an issue. She is on Pradaxa for chronic anticoagulation. She had dental implants and they look very good. She is on lipid-lowering medication. She looks the best I have seen her in a couple of years. She looks more rested. Dental implants have improved her facial expression. She is enjoying getting out and doing things once again. 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 Tends to enjoy more activities such as playing a college reunion. She has a new dog.    Review of Systems     Objective:   Physical Exam neck is supple without JVD thyromegaly or carotid bruits. Chest clear to auscultation. Cardiac exam regular rate and rhythm normal S1 and S2. 1+ nonpitting lower extremity edema. Diabetic foot exam shows no ulcers or calluses.        Assessment & Plan:  Insulin-dependent diabetes-could do better with diet. Hemoglobin A1c 7.4% and previously was 7.3% in February. Hypertension-stable on current regimen  History of CVA  Metabolic syndrome  Obesity  Dependent edema  History of DVT on chronic anticoagulation therapy  Mixed stress and urge urinary incontinence  History of pemphigus now on low-dose prednisone  History of recurrent UTIs  History of vitamin D deficiency  Plan: Return in 4 months .   Time spent with patient 25 minutes

## 2013-04-10 ENCOUNTER — Other Ambulatory Visit: Payer: Self-pay | Admitting: Dermatology

## 2013-04-21 ENCOUNTER — Telehealth: Payer: Self-pay | Admitting: Medical Oncology

## 2013-04-21 NOTE — Telephone Encounter (Signed)
Deanne from Dr. Sherryl Barters office called and would for pt to be seen again. She has another bout of pemphigus foliaceous and he would like her to get treated again with rituxan. I informed her that Dr. Arline Asp has retired and we will get pt reassigned. I spoke with Tiffany and she will get pt scheduled with another provider. I asked Deanne to call back if she has not heard from anyone in our office in a few days.

## 2013-05-07 ENCOUNTER — Other Ambulatory Visit: Payer: Self-pay | Admitting: Oncology

## 2013-05-07 DIAGNOSIS — L109 Pemphigus, unspecified: Secondary | ICD-10-CM

## 2013-05-08 ENCOUNTER — Telehealth: Payer: Self-pay | Admitting: *Deleted

## 2013-05-08 ENCOUNTER — Telehealth: Payer: Self-pay | Admitting: Oncology

## 2013-05-08 ENCOUNTER — Other Ambulatory Visit (HOSPITAL_BASED_OUTPATIENT_CLINIC_OR_DEPARTMENT_OTHER): Payer: 59 | Admitting: Lab

## 2013-05-08 ENCOUNTER — Encounter: Payer: Self-pay | Admitting: Oncology

## 2013-05-08 ENCOUNTER — Ambulatory Visit (HOSPITAL_BASED_OUTPATIENT_CLINIC_OR_DEPARTMENT_OTHER): Payer: 59 | Admitting: Oncology

## 2013-05-08 ENCOUNTER — Ambulatory Visit (HOSPITAL_BASED_OUTPATIENT_CLINIC_OR_DEPARTMENT_OTHER): Payer: 59

## 2013-05-08 VITALS — BP 132/80 | HR 109 | Temp 97.5°F | Resp 20 | Ht 61.5 in | Wt 258.0 lb

## 2013-05-08 DIAGNOSIS — L109 Pemphigus, unspecified: Secondary | ICD-10-CM

## 2013-05-08 LAB — COMPREHENSIVE METABOLIC PANEL (CC13)
AST: 18 U/L (ref 5–34)
Albumin: 3 g/dL — ABNORMAL LOW (ref 3.5–5.0)
Alkaline Phosphatase: 69 U/L (ref 40–150)
BUN: 20.3 mg/dL (ref 7.0–26.0)
Glucose: 173 mg/dl — ABNORMAL HIGH (ref 70–140)
Potassium: 3.9 mEq/L (ref 3.5–5.1)
Sodium: 143 mEq/L (ref 136–145)
Total Bilirubin: 0.39 mg/dL (ref 0.20–1.20)
Total Protein: 7.1 g/dL (ref 6.4–8.3)

## 2013-05-08 LAB — CBC WITH DIFFERENTIAL/PLATELET
EOS%: 3.2 % (ref 0.0–7.0)
Eosinophils Absolute: 0.3 10*3/uL (ref 0.0–0.5)
LYMPH%: 20.5 % (ref 14.0–49.7)
MCH: 31.9 pg (ref 25.1–34.0)
MCV: 93.8 fL (ref 79.5–101.0)
MONO%: 8 % (ref 0.0–14.0)
Platelets: 197 10*3/uL (ref 145–400)
RBC: 4.1 10*6/uL (ref 3.70–5.45)
RDW: 12.5 % (ref 11.2–14.5)

## 2013-05-08 NOTE — Telephone Encounter (Signed)
Per staff message and POF I have scheduled appts.  JMW  

## 2013-05-08 NOTE — Telephone Encounter (Signed)
gave pt appt for lab and MD, emailed Marcelino Duster regarding chemo for August and September 2014

## 2013-05-08 NOTE — Progress Notes (Signed)
Checked in new pt with no financial concerns. °

## 2013-05-08 NOTE — Progress Notes (Signed)
Please see consult note.  

## 2013-05-08 NOTE — Consult Note (Signed)
Reason for Referral: Evaluation for rituximab treatment for pemphigus.   HPI: Julie Mora is a pleasant 72 year old woman with past medical history significant for hypertension and diabetes and diagnosis of pemphigus dating back to 2010. 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 She was initially treated with high-dose prednisone which helped her skin condition to cause a lot of toxicities. She developed lower extremity edema and required recurrent hospitalizations and developed acute renal failure. She was also tried on CellCept as well as azathioprine but she tolerated those poorly. She also had developed Escherichia coli pyelonephritis, acute renal injury and dehydration as complication of these treatments. She was extremely debilitated and required rehabilitation at a nursing home facility. Patient subsequently was referred to the [facility] for possible rituximab treatment and at that time she was evaluated by Dr. Arline Asp. In 2012, she was treated with rituximab for a total of 800 mg intravenously on a weekly basis for a total of 4 weeks. That was concluded in July of 2012. She tolerated those treatments very well without any infusion-related problems. She underwent a rapid infusions of rituximab and subsequent cycles after she received the first infusion without any complications. Patient went into remission for the last 2 years but most recently developed relapse of her skin condition manifesting itself with the eruptions on her chest and back. She was evaluated by her treating dermatologist Dr. [name] and had a biopsy done on 04/10/2013 which confirmed the presence of pemphigus relapse. She was referred to me for the evaluation of the treatment of rituximab.  Clinically, she is doing fairly well. She still maintained on low-dose prednisone every other day without any major complications. She does not report any fevers  or chills or sweats. Does not report any weight loss or constitutional symptoms. She still performs most activities daily living without any hindrance or decline. Not reporting any chest pain or difficulty breathing. Is not reporting any GI or GU symptoms. She is using topical creams to treat the current eruptions on her chest and her back.   Past Medical History  Diagnosis Date  . Incontinence   . Type II or unspecified type diabetes mellitus without mention of complication, not stated as uncontrolled   . Essential hypertension, benign   . Other and unspecified hyperlipidemia   . Vitamin D deficiency   . Anemia   . Renal insufficiency   . Pemphigus foliaceous   :  No past surgical history on file.:  Current Outpatient Prescriptions  Medication Sig Dispense Refill  . ACCU-CHEK AVIVA PLUS test strip       . acetaminophen (TYLENOL) 500 MG tablet Take 500 mg by mouth every 6 (six) hours as needed for pain.      Marland Kitchen bromfenac (XIBROM) 0.09 % ophthalmic solution Place 1 drop into the right eye daily. Daily for 14 days      . CALCIUM ASCORBATE PO Take 1 tablet by mouth daily.      . Cholecalciferol (VITAMIN D) 2000 UNITS tablet Take 2,000 Units by mouth daily.        . dabigatran (PRADAXA) 75 MG CAPS Take 1 capsule (75 mg total) by mouth every 12 (twelve) hours.  60 capsule  6  . diphenhydrAMINE (BENADRYL) 25 mg capsule Take 25 mg by mouth every 6 (six) hours as needed for itching.      Marland Kitchen doxycycline (VIBRAMYCIN) 50 MG capsule Take 50 mg by mouth 2 (two) times  daily.       . ferrous gluconate (FERGON) 325 MG tablet Take 325 mg by mouth 2 (two) times daily.        . fludrocortisone (FLORINEF) 0.1 MG tablet TAKE 2 TABLETS BY MOUTH TWICE DAILY  120 tablet  PRN  . insulin aspart (NOVOLOG) 100 UNIT/ML injection Inject 5-12 Units into the skin 2 (two) times daily as needed for high blood sugar.  3 vial  PRN  . insulin glargine (LANTUS) 100 UNIT/ML injection Inject 25 Units into the skin at bedtime.  Sliding scale      . Insulin Pen Needle (B-D UF III MINI PEN NEEDLES) 31G X 5 MM MISC Inject 10 Units into the skin 5 (five) times daily.  100 each  5  . multivitamin (THERAGRAN) per tablet Take 1 tablet by mouth daily.        Bertram Gala Glycol-Propyl Glycol (SYSTANE) 0.4-0.3 % SOLN Apply 1 drop to eye 3 (three) times daily. Both eyes, as needed      . potassium chloride (K-DUR,KLOR-CON) 10 MEQ tablet TAKE 5 TABLETS BY MOUTH TWICE DAILY  300 tablet  4  . prednisoLONE acetate (PRED FORTE) 1 % ophthalmic suspension Place 1 drop into both eyes 2 (two) times daily.       . predniSONE (DELTASONE) 20 MG tablet Take 5 mg by mouth every other day. Taking 15 mg QOD      . simvastatin (ZOCOR) 20 MG tablet Take 1 tablet (20 mg total) by mouth at bedtime.  30 tablet  11  . torsemide (DEMADEX) 20 MG tablet TAKE 3 TABLETS BY MOUTH EVERY DAY  90 tablet  0  . triamcinolone cream (KENALOG) 0.1 % Apply 1 application topically daily. To affected area      . vitamin C (ASCORBIC ACID) 500 MG tablet Take 500 mg by mouth daily. Two tabs qam        No current facility-administered medications for this visit.       Allergies  Allergen Reactions  . Azathioprine Shortness Of Breath  . Cellcept [Mycophenolate Mofetil]   . Ramipril Cough  :  No family history on file.:  History   Social History  . Marital Status: Widowed    Spouse Name: N/A    Number of Children: N/A  . Years of Education: N/A   Occupational History  . Not on file.   Social History Main Topics  . Smoking status: Never Smoker   . Smokeless tobacco: Not on file  . Alcohol Use: Not on file  . Drug Use: Not on file  . Sexual Activity: Not on file   Other Topics Concern  . Not on file   Social History Narrative  . No narrative on file  :  Pertinent items are noted in HPI.  Exam: Blood pressure 132/80, pulse 109, temperature 97.5 F (36.4 C), temperature source Oral, resp. rate 20, height 5' 1.5" (1.562 m), weight 258 lb  (117.028 kg). General appearance: alert, cooperative and appears stated age Head: Normocephalic, without obvious abnormality, atraumatic Eyes: conjunctivae/corneas clear. PERRL, EOM's intact. Fundi benign. Throat: lips, mucosa, and tongue normal; teeth and gums normal Neck: no adenopathy, no carotid bruit, no JVD, supple, symmetrical, trachea midline and thyroid not enlarged, symmetric, no tenderness/mass/nodules Back: negative Resp: clear to auscultation bilaterally Chest wall: no tenderness Cardio: regular rate and rhythm, S1, S2 normal, no murmur, click, rub or gallop GI: soft, non-tender; bowel sounds normal; no masses,  no organomegaly Extremities: edema 1+ Skin: Skin  color, texture, turgor normal. No rashes or lesions or red eruptions noted on her back and chest.   Lymph nodes: Cervical, supraclavicular, and axillary nodes normal. Neurologic: Grossly normal   Recent Labs  05/08/13 1337  WBC 10.4*  HGB 13.1  HCT 38.5  PLT 197    Recent Labs  05/08/13 1337  NA 143  K 3.9  CO2 24  GLUCOSE 173*  BUN 20.3  CREATININE 1.1  CALCIUM 9.4    Assessment and Plan:   72 year old woman with history of recurrent pemphigus foliaceous. Her initial diagnosis back in 2010 and was treated with high dose steroids that were poorly tolerated that resulted in multiple complications as well as recurrent hospitalization. She was treated in 2012 with rituximab utilizing the lymphoma dose at 375 mg per meter square intravenously on a weekly basis for 4 weeks. And she had an excellent response with remission for the last 2 years now she has presented with recurrent options that are biopsy proven to be relapse of her skin condition. Discussed with her today my literature review of the use of rituximab and this skin condition and in autoimmune diseases  such as rheumatoid arthritis. And there is ample evidence to suggest the role of this drug and this condition. There is also ample evidence to suggest  retreatment with the same dose and schedule might induce remissions at that time.  Risks and benefits of rituximab treatment were discussed again. Although she had an excellent tolerance and response but does not guarantee she will have the same this time around. Complications include infusion-related complications such as hives, pruritus, difficulty breathing and rarely anaphylaxis. The complications that includes fevers and pain are also a possibility. She is aware of all these complications and willing to proceed. We will check her insurance for financial coverage to make sure this drug is approved for this indication and we will proceed with those treatments in the upcoming weeks.  All her questions are answered today to her satisfaction.

## 2013-05-16 ENCOUNTER — Ambulatory Visit (HOSPITAL_BASED_OUTPATIENT_CLINIC_OR_DEPARTMENT_OTHER): Payer: Medicare Other

## 2013-05-16 ENCOUNTER — Ambulatory Visit (HOSPITAL_BASED_OUTPATIENT_CLINIC_OR_DEPARTMENT_OTHER): Payer: Medicare Other | Admitting: Nurse Practitioner

## 2013-05-16 VITALS — BP 136/86 | HR 99 | Temp 97.4°F | Resp 20

## 2013-05-16 DIAGNOSIS — R6883 Chills (without fever): Secondary | ICD-10-CM

## 2013-05-16 DIAGNOSIS — L109 Pemphigus, unspecified: Secondary | ICD-10-CM

## 2013-05-16 DIAGNOSIS — Z5112 Encounter for antineoplastic immunotherapy: Secondary | ICD-10-CM

## 2013-05-16 MED ORDER — METHYLPREDNISOLONE SODIUM SUCC 125 MG IJ SOLR
125.0000 mg | Freq: Once | INTRAMUSCULAR | Status: AC | PRN
  Administered 2013-05-16: 125 mg via INTRAVENOUS

## 2013-05-16 MED ORDER — SODIUM CHLORIDE 0.9 % IV SOLN
Freq: Once | INTRAVENOUS | Status: AC
  Administered 2013-05-16: 10:00:00 via INTRAVENOUS

## 2013-05-16 MED ORDER — ACETAMINOPHEN 325 MG PO TABS
650.0000 mg | ORAL_TABLET | Freq: Once | ORAL | Status: AC
  Administered 2013-05-16: 650 mg via ORAL

## 2013-05-16 MED ORDER — MEPERIDINE HCL 25 MG/ML IJ SOLN
12.5000 mg | Freq: Once | INTRAMUSCULAR | Status: AC
  Administered 2013-05-16: 12.5 mg via INTRAVENOUS

## 2013-05-16 MED ORDER — DIPHENHYDRAMINE HCL 25 MG PO CAPS
50.0000 mg | ORAL_CAPSULE | Freq: Once | ORAL | Status: AC
  Administered 2013-05-16: 50 mg via ORAL

## 2013-05-16 MED ORDER — SODIUM CHLORIDE 0.9 % IV SOLN
375.0000 mg/m2 | Freq: Once | INTRAVENOUS | Status: AC
  Administered 2013-05-16: 800 mg via INTRAVENOUS
  Filled 2013-05-16: qty 80

## 2013-05-16 NOTE — Patient Instructions (Addendum)
[facility] Discharge Instructions for Patients Receiving Chemotherapy  Today you received the following chemotherapy agents:  Rituxan  To help prevent nausea and vomiting after your treatment, we encourage you to take your nausea medication as ordered per MD.   If you develop nausea and vomiting that is not controlled by your nausea medication, call the clinic.   BELOW ARE SYMPTOMS THAT SHOULD BE REPORTED IMMEDIATELY:  *FEVER GREATER THAN 100.5 F  *CHILLS WITH OR WITHOUT FEVER  NAUSEA AND VOMITING THAT IS NOT CONTROLLED WITH YOUR NAUSEA MEDICATION  *UNUSUAL SHORTNESS OF BREATH  *UNUSUAL BRUISING OR BLEEDING  TENDERNESS IN MOUTH AND THROAT WITH OR WITHOUT PRESENCE OF ULCERS  *URINARY PROBLEMS  *BOWEL PROBLEMS  UNUSUAL RASH Items with * indicate a potential emergency and should be followed up as soon as possible.  Feel free to call the clinic you have any questions or concerns. The clinic phone number is [phone].    

## 2013-05-16 NOTE — Progress Notes (Signed)
1224-Pt having shaking chills.  Rituxan now at rate of 150 mg/hr.  Rituxan stopped.  Solumedrol 125 mg. IV given.  L. Thomas NP to infusion room to assess pt.  Demerol 12.5mg  IV given at 1238 per order by L. Thomas NP.  1325-pt without complaints at this time.  Rituxan restarted at 100 mg/hr.

## 2013-05-16 NOTE — Progress Notes (Signed)
OFFICE PROGRESS NOTE  Interval history:  Julie Mora is a 72 year old woman with recurrent pemphigus. She was initially treated in 2012 achieving remission. She recently relapsed, biopsy-proven. She was seen by Dr. Clelia Croft on 05/08/2013 and presents today for retreatment with Rituxan.  Standard premedications of Benadryl and Tylenol were administered. The Rituxan infusion was started at 10:55 AM. At 12:24 PM she developed rigors and the infusion was stopped. She received Solu-Medrol 125 mg intravenously. The rigors persisted.  She denied shortness of breath, chest pain, pruritus, rash.   Objective: Temperature 98.2, heart rate 80, respirations 22, blood pressure 140/92, oxygen saturation 97% on room air.  Pleasant elderly female in no acute distress. Lungs clear. Regular cardiac rhythm. Alert, oriented and conversant. Obvious rigors.  Lab Results: Lab Results  Component Value Date   WBC 10.4* 05/08/2013   HGB 13.1 05/08/2013   HCT 38.5 05/08/2013   MCV 93.8 05/08/2013   PLT 197 05/08/2013    Chemistry:    Chemistry      Component Value Date/Time   NA 143 05/08/2013 1337   NA 144 05/27/2012 1030   K 3.9 05/08/2013 1337   K 3.9 05/27/2012 1030   CL 109 05/27/2012 1030   CO2 24 05/08/2013 1337   CO2 26 05/27/2012 1030   BUN 20.3 05/08/2013 1337   BUN 17 05/27/2012 1030   CREATININE 1.1 05/08/2013 1337   CREATININE 0.88 05/27/2012 1030   CREATININE 1.01 04/03/2011 0907      Component Value Date/Time   CALCIUM 9.4 05/08/2013 1337   CALCIUM 9.4 05/27/2012 1030   ALKPHOS 69 05/08/2013 1337   ALKPHOS 70 12/05/2012 1040   AST 18 05/08/2013 1337   AST 16 12/05/2012 1040   ALT 16 05/08/2013 1337   ALT 12 12/05/2012 1040   BILITOT 0.39 05/08/2013 1337   BILITOT 0.5 12/05/2012 1040       Studies/Results: No results found.  Allergies reviewed.  Assessment/Plan:  1. Recurrent pemphigus, biopsy-proven. 2. Rigors with onset approximately 1-1/2 hours into the Rituxan infusion.  We administered Demerol  12.5 mg intravenously. The rigors resolved. She was monitored for approximately 30 minutes. Rituxan was resumed at the previously tolerated rate. She is currently tolerating the Rituxan well.  Plan reviewed with Dr. Cyndie Chime.  Lonna Cobb ANP/GNP-BC

## 2013-05-23 ENCOUNTER — Ambulatory Visit (HOSPITAL_BASED_OUTPATIENT_CLINIC_OR_DEPARTMENT_OTHER): Payer: Medicare Other

## 2013-05-23 ENCOUNTER — Other Ambulatory Visit (HOSPITAL_BASED_OUTPATIENT_CLINIC_OR_DEPARTMENT_OTHER): Payer: Medicare Other | Admitting: Lab

## 2013-05-23 VITALS — BP 121/70 | HR 83 | Temp 96.7°F | Resp 20

## 2013-05-23 DIAGNOSIS — L109 Pemphigus, unspecified: Secondary | ICD-10-CM

## 2013-05-23 DIAGNOSIS — Z5112 Encounter for antineoplastic immunotherapy: Secondary | ICD-10-CM

## 2013-05-23 LAB — CBC WITH DIFFERENTIAL/PLATELET
BASO%: 0.2 % (ref 0.0–2.0)
Basophils Absolute: 0 10*3/uL (ref 0.0–0.1)
EOS%: 3.9 % (ref 0.0–7.0)
HCT: 36.8 % (ref 34.8–46.6)
MCH: 31.2 pg (ref 25.1–34.0)
MCHC: 33.4 g/dL (ref 31.5–36.0)
MCV: 93.4 fL (ref 79.5–101.0)
MONO%: 8.4 % (ref 0.0–14.0)
NEUT%: 66.9 % (ref 38.4–76.8)
lymph#: 1.9 10*3/uL (ref 0.9–3.3)

## 2013-05-23 LAB — COMPREHENSIVE METABOLIC PANEL (CC13)
AST: 14 U/L (ref 5–34)
Alkaline Phosphatase: 68 U/L (ref 40–150)
BUN: 24 mg/dL (ref 7.0–26.0)
Creatinine: 1 mg/dL (ref 0.6–1.1)
Glucose: 218 mg/dl — ABNORMAL HIGH (ref 70–140)
Potassium: 4.1 mEq/L (ref 3.5–5.1)
Total Bilirubin: 0.46 mg/dL (ref 0.20–1.20)

## 2013-05-23 MED ORDER — DIPHENHYDRAMINE HCL 25 MG PO CAPS
50.0000 mg | ORAL_CAPSULE | Freq: Once | ORAL | Status: AC
  Administered 2013-05-23: 50 mg via ORAL

## 2013-05-23 MED ORDER — SODIUM CHLORIDE 0.9 % IV SOLN
Freq: Once | INTRAVENOUS | Status: AC
  Administered 2013-05-23: 10:00:00 via INTRAVENOUS

## 2013-05-23 MED ORDER — SODIUM CHLORIDE 0.9 % IV SOLN
375.0000 mg/m2 | Freq: Once | INTRAVENOUS | Status: AC
  Administered 2013-05-23: 800 mg via INTRAVENOUS
  Filled 2013-05-23: qty 80

## 2013-05-23 MED ORDER — ACETAMINOPHEN 325 MG PO TABS
650.0000 mg | ORAL_TABLET | Freq: Once | ORAL | Status: AC
  Administered 2013-05-23: 650 mg via ORAL

## 2013-05-23 NOTE — Progress Notes (Signed)
Due to shaking chills on last rituxan infusion, I titrated rituxan at 50mg /hr increments.

## 2013-05-23 NOTE — Patient Instructions (Addendum)
[facility] Discharge Instructions for Patients Receiving Chemotherapy  Today you received the following chemotherapy agents RITUXAN  To help prevent nausea and vomiting after your treatment, we encourage you to take your nausea medication IF NEEDED.   If you develop nausea and vomiting that is not controlled by your nausea medication, call the clinic.   BELOW ARE SYMPTOMS THAT SHOULD BE REPORTED IMMEDIATELY:  *FEVER GREATER THAN 100.5 F  *CHILLS WITH OR WITHOUT FEVER  NAUSEA AND VOMITING THAT IS NOT CONTROLLED WITH YOUR NAUSEA MEDICATION  *UNUSUAL SHORTNESS OF BREATH  *UNUSUAL BRUISING OR BLEEDING  TENDERNESS IN MOUTH AND THROAT WITH OR WITHOUT PRESENCE OF ULCERS  *URINARY PROBLEMS  *BOWEL PROBLEMS  UNUSUAL RASH Items with * indicate a potential emergency and should be followed up as soon as possible.  Feel free to call the clinic you have any questions or concerns. The clinic phone number is [phone].    

## 2013-05-30 ENCOUNTER — Ambulatory Visit (HOSPITAL_BASED_OUTPATIENT_CLINIC_OR_DEPARTMENT_OTHER): Payer: Medicare Other

## 2013-05-30 ENCOUNTER — Other Ambulatory Visit (HOSPITAL_BASED_OUTPATIENT_CLINIC_OR_DEPARTMENT_OTHER): Payer: Medicare Other | Admitting: Lab

## 2013-05-30 VITALS — BP 125/74 | HR 80 | Temp 97.1°F | Resp 20

## 2013-05-30 DIAGNOSIS — Z5112 Encounter for antineoplastic immunotherapy: Secondary | ICD-10-CM

## 2013-05-30 DIAGNOSIS — L109 Pemphigus, unspecified: Secondary | ICD-10-CM

## 2013-05-30 LAB — CBC WITH DIFFERENTIAL/PLATELET
Basophils Absolute: 0 10*3/uL (ref 0.0–0.1)
EOS%: 2.8 % (ref 0.0–7.0)
Eosinophils Absolute: 0.3 10*3/uL (ref 0.0–0.5)
HCT: 37.6 % (ref 34.8–46.6)
HGB: 12.5 g/dL (ref 11.6–15.9)
MCH: 31.4 pg (ref 25.1–34.0)
MCV: 94.5 fL (ref 79.5–101.0)
MONO%: 9.1 % (ref 0.0–14.0)
NEUT#: 5.8 10*3/uL (ref 1.5–6.5)
NEUT%: 63.5 % (ref 38.4–76.8)
Platelets: 225 10*3/uL (ref 145–400)
RDW: 13 % (ref 11.2–14.5)

## 2013-05-30 LAB — COMPREHENSIVE METABOLIC PANEL (CC13)
AST: 16 U/L (ref 5–34)
Albumin: 2.8 g/dL — ABNORMAL LOW (ref 3.5–5.0)
Alkaline Phosphatase: 69 U/L (ref 40–150)
BUN: 24.8 mg/dL (ref 7.0–26.0)
Calcium: 8.6 mg/dL (ref 8.4–10.4)
Creatinine: 1 mg/dL (ref 0.6–1.1)
Glucose: 188 mg/dl — ABNORMAL HIGH (ref 70–140)
Potassium: 3.7 mEq/L (ref 3.5–5.1)

## 2013-05-30 MED ORDER — DIPHENHYDRAMINE HCL 25 MG PO CAPS
50.0000 mg | ORAL_CAPSULE | Freq: Once | ORAL | Status: AC
  Administered 2013-05-30: 50 mg via ORAL

## 2013-05-30 MED ORDER — SODIUM CHLORIDE 0.9 % IV SOLN
375.0000 mg/m2 | Freq: Once | INTRAVENOUS | Status: DC
  Filled 2013-05-30: qty 80

## 2013-05-30 MED ORDER — SODIUM CHLORIDE 0.9 % IV SOLN
375.0000 mg/m2 | Freq: Once | INTRAVENOUS | Status: AC
  Administered 2013-05-30: 800 mg via INTRAVENOUS
  Filled 2013-05-30: qty 80

## 2013-05-30 MED ORDER — DIPHENHYDRAMINE HCL 25 MG PO CAPS
ORAL_CAPSULE | ORAL | Status: AC
  Filled 2013-05-30: qty 2

## 2013-05-30 MED ORDER — SODIUM CHLORIDE 0.9 % IV SOLN
Freq: Once | INTRAVENOUS | Status: AC
  Administered 2013-05-30: 10:00:00 via INTRAVENOUS

## 2013-05-30 MED ORDER — ACETAMINOPHEN 325 MG PO TABS
650.0000 mg | ORAL_TABLET | Freq: Once | ORAL | Status: AC
  Administered 2013-05-30: 650 mg via ORAL

## 2013-05-30 MED ORDER — ACETAMINOPHEN 325 MG PO TABS
ORAL_TABLET | ORAL | Status: AC
  Filled 2013-05-30: qty 2

## 2013-05-30 NOTE — Patient Instructions (Signed)
[facility] Discharge Instructions for Patients Receiving Chemotherapy  Today you received the following chemotherapy agents Rituxan.  To help prevent nausea and vomiting after your treatment, we encourage you to take your nausea medication as prescribed.   If you develop nausea and vomiting that is not controlled by your nausea medication, call the clinic.   BELOW ARE SYMPTOMS THAT SHOULD BE REPORTED IMMEDIATELY:  *FEVER GREATER THAN 100.5 F  *CHILLS WITH OR WITHOUT FEVER  NAUSEA AND VOMITING THAT IS NOT CONTROLLED WITH YOUR NAUSEA MEDICATION  *UNUSUAL SHORTNESS OF BREATH  *UNUSUAL BRUISING OR BLEEDING  TENDERNESS IN MOUTH AND THROAT WITH OR WITHOUT PRESENCE OF ULCERS  *URINARY PROBLEMS  *BOWEL PROBLEMS  UNUSUAL RASH Items with * indicate a potential emergency and should be followed up as soon as possible.  Feel free to call the clinic you have any questions or concerns. The clinic phone number is [phone].    

## 2013-05-30 NOTE — Progress Notes (Signed)
Patient tolerated Rituxan without any complaints, or signs of reaction. Rituxan titrated in 100 mg/hr increments.

## 2013-06-06 ENCOUNTER — Other Ambulatory Visit (HOSPITAL_BASED_OUTPATIENT_CLINIC_OR_DEPARTMENT_OTHER): Payer: Medicare Other | Admitting: Lab

## 2013-06-06 ENCOUNTER — Ambulatory Visit (HOSPITAL_BASED_OUTPATIENT_CLINIC_OR_DEPARTMENT_OTHER): Payer: Medicare Other

## 2013-06-06 VITALS — BP 122/81 | HR 83 | Temp 97.4°F | Resp 20

## 2013-06-06 DIAGNOSIS — Z5112 Encounter for antineoplastic immunotherapy: Secondary | ICD-10-CM

## 2013-06-06 DIAGNOSIS — L109 Pemphigus, unspecified: Secondary | ICD-10-CM

## 2013-06-06 LAB — CBC WITH DIFFERENTIAL/PLATELET
BASO%: 0.4 % (ref 0.0–2.0)
Basophils Absolute: 0 10*3/uL (ref 0.0–0.1)
EOS%: 4.1 % (ref 0.0–7.0)
HCT: 39.3 % (ref 34.8–46.6)
LYMPH%: 27.4 % (ref 14.0–49.7)
MCH: 31.5 pg (ref 25.1–34.0)
MCHC: 33.3 g/dL (ref 31.5–36.0)
MCV: 94.5 fL (ref 79.5–101.0)
NEUT%: 56.8 % (ref 38.4–76.8)
Platelets: 216 10*3/uL (ref 145–400)

## 2013-06-06 LAB — COMPREHENSIVE METABOLIC PANEL (CC13)
AST: 14 U/L (ref 5–34)
Alkaline Phosphatase: 64 U/L (ref 40–150)
BUN: 28 mg/dL — ABNORMAL HIGH (ref 7.0–26.0)
Calcium: 9.2 mg/dL (ref 8.4–10.4)
Creatinine: 1.1 mg/dL (ref 0.6–1.1)
Glucose: 242 mg/dl — ABNORMAL HIGH (ref 70–140)

## 2013-06-06 MED ORDER — SODIUM CHLORIDE 0.9 % IV SOLN
Freq: Once | INTRAVENOUS | Status: AC
  Administered 2013-06-06: 10:00:00 via INTRAVENOUS

## 2013-06-06 MED ORDER — ACETAMINOPHEN 325 MG PO TABS
650.0000 mg | ORAL_TABLET | Freq: Once | ORAL | Status: AC
  Administered 2013-06-06: 650 mg via ORAL

## 2013-06-06 MED ORDER — DIPHENHYDRAMINE HCL 25 MG PO CAPS
50.0000 mg | ORAL_CAPSULE | Freq: Once | ORAL | Status: AC
  Administered 2013-06-06: 50 mg via ORAL

## 2013-06-06 MED ORDER — DIPHENHYDRAMINE HCL 25 MG PO CAPS
ORAL_CAPSULE | ORAL | Status: AC
Start: 2013-06-06 — End: 2013-06-06
  Filled 2013-06-06: qty 2

## 2013-06-06 MED ORDER — ACETAMINOPHEN 325 MG PO TABS
ORAL_TABLET | ORAL | Status: AC
  Filled 2013-06-06: qty 2

## 2013-06-06 MED ORDER — SODIUM CHLORIDE 0.9 % IV SOLN
375.0000 mg/m2 | Freq: Once | INTRAVENOUS | Status: AC
  Administered 2013-06-06: 800 mg via INTRAVENOUS
  Filled 2013-06-06: qty 80

## 2013-06-06 NOTE — Patient Instructions (Addendum)
[facility] Discharge Instructions for Patients Receiving Chemotherapy  Today you received the following chemotherapy agents Rituxan.  To help prevent nausea and vomiting after your treatment, we encourage you to take your nausea medication as prescribed.   If you develop nausea and vomiting that is not controlled by your nausea medication, call the clinic.   BELOW ARE SYMPTOMS THAT SHOULD BE REPORTED IMMEDIATELY:  *FEVER GREATER THAN 100.5 F  *CHILLS WITH OR WITHOUT FEVER  NAUSEA AND VOMITING THAT IS NOT CONTROLLED WITH YOUR NAUSEA MEDICATION  *UNUSUAL SHORTNESS OF BREATH  *UNUSUAL BRUISING OR BLEEDING  TENDERNESS IN MOUTH AND THROAT WITH OR WITHOUT PRESENCE OF ULCERS  *URINARY PROBLEMS  *BOWEL PROBLEMS  UNUSUAL RASH Items with * indicate a potential emergency and should be followed up as soon as possible.  Feel free to call the clinic you have any questions or concerns. The clinic phone number is [phone].    

## 2013-06-19 ENCOUNTER — Other Ambulatory Visit: Payer: Self-pay | Admitting: Oncology

## 2013-06-19 DIAGNOSIS — L109 Pemphigus, unspecified: Secondary | ICD-10-CM

## 2013-06-20 ENCOUNTER — Ambulatory Visit (HOSPITAL_BASED_OUTPATIENT_CLINIC_OR_DEPARTMENT_OTHER): Payer: Medicare Other | Admitting: Oncology

## 2013-06-20 ENCOUNTER — Other Ambulatory Visit (HOSPITAL_BASED_OUTPATIENT_CLINIC_OR_DEPARTMENT_OTHER): Payer: Medicare Other | Admitting: Lab

## 2013-06-20 VITALS — BP 129/72 | HR 83 | Temp 97.9°F | Resp 20 | Ht 61.5 in | Wt 258.8 lb

## 2013-06-20 DIAGNOSIS — L109 Pemphigus, unspecified: Secondary | ICD-10-CM

## 2013-06-20 LAB — CBC WITH DIFFERENTIAL/PLATELET
Eosinophils Absolute: 0.3 10*3/uL (ref 0.0–0.5)
MONO#: 0.9 10*3/uL (ref 0.1–0.9)
NEUT#: 5.4 10*3/uL (ref 1.5–6.5)
RBC: 3.89 10*6/uL (ref 3.70–5.45)
RDW: 13 % (ref 11.2–14.5)
WBC: 8.2 10*3/uL (ref 3.9–10.3)
lymph#: 1.6 10*3/uL (ref 0.9–3.3)

## 2013-06-20 NOTE — Progress Notes (Signed)
Hematology and Oncology Follow Up Visit  Julie Mora [id number] [birth year] 72 y.o. 06/20/2013 9:59 AM  Principle Diagnosis: 72 year old woman with history of recurrent pemphigus foliaceous diagnosed in 2010.  Prior Therapy: She is status post Rituxan treatment with 375 mg per meter square intravenously on a weekly basis for 4 weeks completed in 2012. Patient developed a recent relapse.  Current therapy: She completed another course of Rituxan with 375 mg dose meters square intravenously on a weekly basis for 4 weeks last treatment given on 06/06/2013.  Interim History:  Julie Mora presents today for a followup visit. She is a very pleasant 72 year old woman with the above diagnosis and history. She tolerated the Rituxan infusions without any major complications. She did have a minor infusion reaction with the first treatment and required a low-dose of Demerol for rigors I was able to receive all doses without any other complications. She is feeling well overall without any major issues. She reveals that her skin lesions are dramatically improved.  Medications: I have reviewed the patient's current medications.  Current Outpatient Prescriptions  Medication Sig Dispense Refill  . ACCU-CHEK AVIVA PLUS test strip       . acetaminophen (TYLENOL) 500 MG tablet Take 500 mg by mouth every 6 (six) hours as needed for pain.      Marland Kitchen bromfenac (XIBROM) 0.09 % ophthalmic solution Place 1 drop into the right eye daily. Daily for 14 days      . CALCIUM ASCORBATE PO Take 1 tablet by mouth daily.      . Cholecalciferol (VITAMIN D) 2000 UNITS tablet Take 2,000 Units by mouth daily.        . dabigatran (PRADAXA) 75 MG CAPS Take 1 capsule (75 mg total) by mouth every 12 (twelve) hours.  60 capsule  6  . diphenhydrAMINE (BENADRYL) 25 mg capsule Take 25 mg by mouth every 6 (six) hours as needed for itching.      Marland Kitchen doxycycline (VIBRAMYCIN) 50 MG capsule Take 50 mg by mouth 2 (two) times daily.       . ferrous  gluconate (FERGON) 325 MG tablet Take 325 mg by mouth 2 (two) times daily.        . fludrocortisone (FLORINEF) 0.1 MG tablet TAKE 2 TABLETS BY MOUTH TWICE DAILY  120 tablet  PRN  . insulin aspart (NOVOLOG) 100 UNIT/ML injection Inject 5-12 Units into the skin 2 (two) times daily as needed for high blood sugar.  3 vial  PRN  . insulin glargine (LANTUS) 100 UNIT/ML injection Inject 25 Units into the skin at bedtime. Sliding scale      . Insulin Pen Needle (B-D UF III MINI PEN NEEDLES) 31G X 5 MM MISC Inject 10 Units into the skin 5 (five) times daily.  100 each  5  . multivitamin (THERAGRAN) per tablet Take 1 tablet by mouth daily.        Bertram Gala Glycol-Propyl Glycol (SYSTANE) 0.4-0.3 % SOLN Apply 1 drop to eye 3 (three) times daily. Both eyes, as needed      . potassium chloride (K-DUR,KLOR-CON) 10 MEQ tablet TAKE 5 TABLETS BY MOUTH TWICE DAILY  300 tablet  4  . prednisoLONE acetate (PRED FORTE) 1 % ophthalmic suspension Place 1 drop into both eyes 2 (two) times daily.       . predniSONE (DELTASONE) 20 MG tablet Take 5 mg by mouth every other day. Taking 15 mg QOD      . simvastatin (ZOCOR) 20 MG tablet Take 1 tablet (  20 mg total) by mouth at bedtime.  30 tablet  11  . torsemide (DEMADEX) 20 MG tablet TAKE 3 TABLETS BY MOUTH EVERY DAY  90 tablet  0  . triamcinolone cream (KENALOG) 0.1 % Apply 1 application topically daily. To affected area      . vitamin C (ASCORBIC ACID) 500 MG tablet Take 500 mg by mouth daily. Two tabs qam        No current facility-administered medications for this visit.     Allergies:  Allergies  Allergen Reactions  . Azathioprine Shortness Of Breath  . Cellcept [Mycophenolate Mofetil]   . Ramipril Cough    Past Medical History, Surgical history, Social history, and Family History were reviewed and updated.  Review of Systems:  Remaining ROS negative. Physical Exam: Blood pressure 129/72, pulse 83, temperature 97.9 F (36.6 C), temperature source Oral,  resp. rate 20, height 5' 1.5" (1.562 m), weight 258 lb 12.8 oz (117.391 kg). ECOG:  1 General appearance: alert, cooperative and appears stated age Head: Normocephalic, without obvious abnormality, atraumatic Neck: no adenopathy, no carotid bruit, no JVD, supple, symmetrical, trachea midline and thyroid not enlarged, symmetric, no tenderness/mass/nodules Lymph nodes: Cervical, supraclavicular, and axillary nodes normal. Heart:regular rate and rhythm, S1, S2 normal, no murmur, click, rub or gallop Lung:chest clear, no wheezing, rales, normal symmetric air entry Abdomin: soft, non-tender, without masses or organomegaly EXT:no erythema, induration, or nodules SKIN: Red eruptions on the chest and back are dramatically healed and improved from previous examination.  Lab Results: Lab Results  Component Value Date   WBC 8.2 06/20/2013   HGB 12.3 06/20/2013   HCT 36.7 06/20/2013   MCV 94.3 06/20/2013   PLT 177 06/20/2013     Chemistry      Component Value Date/Time   NA 140 06/06/2013 0857   NA 144 05/27/2012 1030   K 3.9 06/06/2013 0857   K 3.9 05/27/2012 1030   CL 109 05/27/2012 1030   CO2 22 06/06/2013 0857   CO2 26 05/27/2012 1030   BUN 28.0* 06/06/2013 0857   BUN 17 05/27/2012 1030   CREATININE 1.1 06/06/2013 0857   CREATININE 0.88 05/27/2012 1030   CREATININE 1.01 04/03/2011 0907      Component Value Date/Time   CALCIUM 9.2 06/06/2013 0857   CALCIUM 9.4 05/27/2012 1030   ALKPHOS 64 06/06/2013 0857   ALKPHOS 70 12/05/2012 1040   AST 14 06/06/2013 0857   AST 16 12/05/2012 1040   ALT 11 06/06/2013 0857   ALT 12 12/05/2012 1040   BILITOT 0.38 06/06/2013 0857   BILITOT 0.5 12/05/2012 1040       Impression and Plan:  72 year old woman with history of pemphigus foliaceous. She is status post Rituxan treatment in 2012 and recent retreatment that was completed on 06/06/2013. She did not have any major complications associated with it. She did have a minor infusion-related reaction with the initial infusion  but that was subsided quickly. Her clinical lesions appeared to have improved but I will defer to her dermatologist for any future management of her disease. Her laboratory data and physical examination today does not report any delayed complications associated with this rituximab treatment.  All her questions are answered today and I will be happy to see her in the future as needed.   SHADAD,FIRAS, MD 10/3/20149:59 AM

## 2013-07-15 ENCOUNTER — Other Ambulatory Visit: Payer: Medicare Other | Admitting: Internal Medicine

## 2013-07-15 DIAGNOSIS — E119 Type 2 diabetes mellitus without complications: Secondary | ICD-10-CM

## 2013-07-15 LAB — HEMOGLOBIN A1C
Hgb A1c MFr Bld: 8.7 % — ABNORMAL HIGH (ref <5.7)
Mean Plasma Glucose: 203 mg/dL — ABNORMAL HIGH (ref <117)

## 2013-07-16 ENCOUNTER — Other Ambulatory Visit: Payer: Self-pay | Admitting: Dermatology

## 2013-07-17 ENCOUNTER — Ambulatory Visit (INDEPENDENT_AMBULATORY_CARE_PROVIDER_SITE_OTHER): Payer: Medicare Other | Admitting: Internal Medicine

## 2013-07-17 ENCOUNTER — Encounter: Payer: Self-pay | Admitting: Internal Medicine

## 2013-07-17 VITALS — BP 120/84 | HR 88 | Temp 97.5°F | Ht 63.0 in | Wt 257.0 lb

## 2013-07-17 DIAGNOSIS — Z23 Encounter for immunization: Secondary | ICD-10-CM

## 2013-07-17 DIAGNOSIS — E119 Type 2 diabetes mellitus without complications: Secondary | ICD-10-CM

## 2013-07-17 DIAGNOSIS — L109 Pemphigus, unspecified: Secondary | ICD-10-CM

## 2013-07-17 DIAGNOSIS — N390 Urinary tract infection, site not specified: Secondary | ICD-10-CM

## 2013-07-17 LAB — POCT URINALYSIS DIPSTICK
Bilirubin, UA: NEGATIVE
Nitrite, UA: POSITIVE
Urobilinogen, UA: 0.2
pH, UA: 5

## 2013-07-18 LAB — MICROALBUMIN / CREATININE URINE RATIO
Creatinine, Urine: 157.5 mg/dL
Microalb Creat Ratio: 44.1 mg/g — ABNORMAL HIGH (ref 0.0–30.0)

## 2013-07-29 ENCOUNTER — Other Ambulatory Visit: Payer: Self-pay | Admitting: Internal Medicine

## 2013-07-29 DIAGNOSIS — Z0000 Encounter for general adult medical examination without abnormal findings: Secondary | ICD-10-CM

## 2013-08-01 LAB — URINE CULTURE: Culture: >=100000

## 2013-08-04 ENCOUNTER — Telehealth: Payer: Self-pay | Admitting: Internal Medicine

## 2013-08-04 MED ORDER — CIPROFLOXACIN HCL 250 MG PO TABS: 250.0000 mg | ORAL_TABLET | Freq: Two times a day (BID) | ORAL | Status: DC

## 2013-08-04 NOTE — Telephone Encounter (Signed)
Escherichia coli UTI. Sensitive to Cipro. Treat with Cipro 250 mg twice daily x10 days with repeat urinalysis after that.

## 2013-08-10 ENCOUNTER — Encounter: Payer: Self-pay | Admitting: Oncology

## 2013-08-10 NOTE — Progress Notes (Signed)
Letter dictated

## 2013-08-17 ENCOUNTER — Other Ambulatory Visit: Payer: Self-pay | Admitting: Internal Medicine

## 2013-08-20 ENCOUNTER — Other Ambulatory Visit (INDEPENDENT_AMBULATORY_CARE_PROVIDER_SITE_OTHER): Payer: Medicare Other | Admitting: Internal Medicine

## 2013-08-20 ENCOUNTER — Other Ambulatory Visit: Payer: Self-pay | Admitting: Internal Medicine

## 2013-08-20 DIAGNOSIS — H169 Unspecified keratitis: Secondary | ICD-10-CM | POA: Insufficient documentation

## 2013-08-20 DIAGNOSIS — N390 Urinary tract infection, site not specified: Secondary | ICD-10-CM

## 2013-08-20 LAB — POCT URINALYSIS DIPSTICK
Bilirubin, UA: NEGATIVE
Glucose, UA: NEGATIVE
Nitrite, UA: NEGATIVE
Spec Grav, UA: 1.015
Urobilinogen, UA: 1
pH, UA: 6.5

## 2013-08-22 ENCOUNTER — Telehealth: Payer: Self-pay | Admitting: Internal Medicine

## 2013-08-22 ENCOUNTER — Other Ambulatory Visit: Payer: Medicare Other | Admitting: Internal Medicine

## 2013-08-22 LAB — URINE CULTURE: Colony Count: >=100000

## 2013-08-22 MED ORDER — CEPHALEXIN 500 MG PO CAPS: 500.0000 mg | ORAL_CAPSULE | Freq: Four times a day (QID) | ORAL | Status: DC

## 2013-08-22 NOTE — Telephone Encounter (Signed)
Now has Proteus urinary tract infection sensitive to Keflex but resistant to Cipro. Prescribed Keflex 500 mg 4 times daily for 10 days. Repeat urinalysis in 2 weeks.

## 2013-08-24 ENCOUNTER — Other Ambulatory Visit: Payer: Self-pay | Admitting: Internal Medicine

## 2013-09-04 ENCOUNTER — Other Ambulatory Visit: Payer: Medicare Other | Admitting: Internal Medicine

## 2013-09-09 ENCOUNTER — Other Ambulatory Visit (INDEPENDENT_AMBULATORY_CARE_PROVIDER_SITE_OTHER): Payer: Medicare Other | Admitting: Internal Medicine

## 2013-09-09 ENCOUNTER — Other Ambulatory Visit: Payer: Self-pay | Admitting: Internal Medicine

## 2013-09-09 DIAGNOSIS — Z87440 Personal history of urinary (tract) infections: Secondary | ICD-10-CM

## 2013-09-09 LAB — POCT URINALYSIS DIPSTICK
Spec Grav, UA: <=1.005
pH, UA: 5

## 2013-09-12 ENCOUNTER — Encounter: Payer: Self-pay | Admitting: Internal Medicine

## 2013-09-12 NOTE — Progress Notes (Signed)
   Subjective:    Patient ID: Julie Mora, female    DOB: Nov 13, 1940, 72 y.o.   MRN: [id number]  HPI Followup on recent urinary tract infection. Patient came in took a long time in the bathroom to give specimen. Patient had moderate LE on repeat urinalysis despite treatment recently for urinary tract infection. I'm not sure this was a clean catch said she had difficulty providing a specimen. She was in the bathroom well over 30 minutes. Culture will be resent.    Review of Systems     Objective:   Physical Exam Not examined       Assessment & Plan:  Abnormal urinalysis plan: Await urine culture results  Addendum 09/12/2013: Urine culture reveals no growth. As soon UTI has resolved.

## 2013-09-12 NOTE — Addendum Note (Signed)
Addended by: Margaree Mackintosh on: 09/12/2013 10:35 PM   Modules accepted: Level of Service

## 2013-09-12 NOTE — Patient Instructions (Signed)
Await urine culture results.  Addendum: Urine culture shows no growth. Patient will not be retreated

## 2013-09-15 ENCOUNTER — Other Ambulatory Visit: Payer: Self-pay | Admitting: Internal Medicine

## 2013-10-15 DIAGNOSIS — H26499 Other secondary cataract, unspecified eye: Secondary | ICD-10-CM | POA: Insufficient documentation

## 2013-11-06 ENCOUNTER — Other Ambulatory Visit: Payer: Self-pay | Admitting: Internal Medicine

## 2013-11-14 NOTE — Patient Instructions (Addendum)
Await culture results of urine. Try to watch diet and exercise. Return in 4 months. Flu vaccine given today.

## 2013-11-14 NOTE — Progress Notes (Signed)
   Subjective:    Patient ID: Julie Mora, female    DOB: 03/22/1941, 73 y.o.   MRN: [id number]  HPI Patient here for followup of diabetes mellitus. Unfortunately hemoglobin A1c has increased from 7.4% in June to 8.7%. She has had another flareup of pemphigus. This is been discouraging for her. Has not been watching her diet. Urinalysis today is consistent with another urinary tract infection. Culture to be obtained. She also has significant microalbuminuria. Remains on chronic anticoagulation therapy. Blood pressure is stable.    Review of Systems     Objective:   Physical Exam Skin warm and dry. Chest clear to auscultation. Cardiac exam regular rate and rhythm normal S1 and S2. Extremities trace lower extremity edema. No JVD or thyromegaly.       Assessment & Plan:  Type 2 diabetes mellitus  Recurrent pemphigus  Urinary tract infection  Chronic anticoagulation  Plan: Await urine culture results. Spoke with patient about diet and exercise. She will try to do better with both. Return in 4 months.

## 2013-12-23 ENCOUNTER — Other Ambulatory Visit: Payer: Medicare Other | Admitting: Internal Medicine

## 2013-12-25 ENCOUNTER — Encounter: Payer: Medicare Other | Admitting: Internal Medicine

## 2013-12-30 ENCOUNTER — Other Ambulatory Visit: Payer: Medicare Other | Admitting: Internal Medicine

## 2013-12-30 DIAGNOSIS — E119 Type 2 diabetes mellitus without complications: Secondary | ICD-10-CM

## 2013-12-30 DIAGNOSIS — D509 Iron deficiency anemia, unspecified: Secondary | ICD-10-CM

## 2013-12-30 DIAGNOSIS — E559 Vitamin D deficiency, unspecified: Secondary | ICD-10-CM

## 2013-12-30 DIAGNOSIS — Z1329 Encounter for screening for other suspected endocrine disorder: Secondary | ICD-10-CM

## 2013-12-30 DIAGNOSIS — Z79899 Other long term (current) drug therapy: Secondary | ICD-10-CM

## 2013-12-30 DIAGNOSIS — E785 Hyperlipidemia, unspecified: Secondary | ICD-10-CM

## 2013-12-30 DIAGNOSIS — I10 Essential (primary) hypertension: Secondary | ICD-10-CM

## 2013-12-30 LAB — CBC WITH DIFFERENTIAL/PLATELET
BASOS ABS: 0 10*3/uL (ref 0.0–0.1)
Basophils Relative: 0 % (ref 0–1)
Eosinophils Absolute: 0.2 10*3/uL (ref 0.0–0.7)
Eosinophils Relative: 3 % (ref 0–5)
HEMATOCRIT: 38.6 % (ref 36.0–46.0)
Hemoglobin: 13.1 g/dL (ref 12.0–15.0)
LYMPHS PCT: 31 % (ref 12–46)
Lymphs Abs: 2.5 10*3/uL (ref 0.7–4.0)
MCH: 31 pg (ref 26.0–34.0)
MCHC: 33.9 g/dL (ref 30.0–36.0)
MCV: 91.5 fL (ref 78.0–100.0)
Monocytes Absolute: 0.7 10*3/uL (ref 0.1–1.0)
Monocytes Relative: 8 % (ref 3–12)
NEUTROS ABS: 4.8 10*3/uL (ref 1.7–7.7)
Neutrophils Relative %: 58 % (ref 43–77)
PLATELETS: 235 10*3/uL (ref 150–400)
RBC: 4.22 MIL/uL (ref 3.87–5.11)
RDW: 13.9 % (ref 11.5–15.5)
WBC: 8.2 10*3/uL (ref 4.0–10.5)

## 2013-12-30 LAB — LIPID PANEL
CHOL/HDL RATIO: 4.5 ratio
Cholesterol: 198 mg/dL (ref 0–200)
HDL: 44 mg/dL (ref >39)
LDL CALC: 133 mg/dL — AB (ref 0–99)
Triglycerides: 107 mg/dL (ref <150)
VLDL: 21 mg/dL (ref 0–40)

## 2013-12-30 LAB — COMPREHENSIVE METABOLIC PANEL
ALT: 13 U/L (ref 0–35)
AST: 20 U/L (ref 0–37)
Albumin: 3.4 g/dL — ABNORMAL LOW (ref 3.5–5.2)
Alkaline Phosphatase: 58 U/L (ref 39–117)
BUN: 20 mg/dL (ref 6–23)
CO2: 26 mEq/L (ref 19–32)
CREATININE: 1.08 mg/dL (ref 0.50–1.10)
Calcium: 9.6 mg/dL (ref 8.4–10.5)
Chloride: 110 mEq/L (ref 96–112)
Glucose, Bld: 109 mg/dL — ABNORMAL HIGH (ref 70–99)
POTASSIUM: 4.1 meq/L (ref 3.5–5.3)
Sodium: 145 mEq/L (ref 135–145)
Total Bilirubin: 0.4 mg/dL (ref 0.2–1.2)
Total Protein: 6.3 g/dL (ref 6.0–8.3)

## 2013-12-30 LAB — HEMOGLOBIN A1C
Hgb A1c MFr Bld: 9 % — ABNORMAL HIGH (ref <5.7)
Mean Plasma Glucose: 212 mg/dL — ABNORMAL HIGH (ref <117)

## 2013-12-31 LAB — VITAMIN D 25 HYDROXY (VIT D DEFICIENCY, FRACTURES): Vit D, 25-Hydroxy: 21 ng/mL — ABNORMAL LOW (ref 30–89)

## 2013-12-31 LAB — TSH: TSH: 2.214 u[IU]/mL (ref 0.350–4.500)

## 2014-01-01 ENCOUNTER — Ambulatory Visit (INDEPENDENT_AMBULATORY_CARE_PROVIDER_SITE_OTHER): Payer: Medicare Other | Admitting: Internal Medicine

## 2014-01-01 ENCOUNTER — Encounter: Payer: Self-pay | Admitting: Internal Medicine

## 2014-01-01 VITALS — BP 136/86 | HR 80 | Temp 98.9°F | Ht 62.0 in | Wt 264.0 lb

## 2014-01-01 DIAGNOSIS — Z794 Long term (current) use of insulin: Secondary | ICD-10-CM

## 2014-01-01 DIAGNOSIS — Z86718 Personal history of other venous thrombosis and embolism: Secondary | ICD-10-CM

## 2014-01-01 DIAGNOSIS — Z7901 Long term (current) use of anticoagulants: Secondary | ICD-10-CM

## 2014-01-01 DIAGNOSIS — R609 Edema, unspecified: Secondary | ICD-10-CM

## 2014-01-01 DIAGNOSIS — E119 Type 2 diabetes mellitus without complications: Secondary | ICD-10-CM

## 2014-01-01 DIAGNOSIS — IMO0001 Reserved for inherently not codable concepts without codable children: Secondary | ICD-10-CM

## 2014-01-01 DIAGNOSIS — Z87440 Personal history of urinary (tract) infections: Secondary | ICD-10-CM

## 2014-01-01 DIAGNOSIS — I10 Essential (primary) hypertension: Secondary | ICD-10-CM

## 2014-01-01 DIAGNOSIS — E785 Hyperlipidemia, unspecified: Secondary | ICD-10-CM

## 2014-01-01 DIAGNOSIS — Z8673 Personal history of transient ischemic attack (TIA), and cerebral infarction without residual deficits: Secondary | ICD-10-CM

## 2014-01-01 DIAGNOSIS — E669 Obesity, unspecified: Secondary | ICD-10-CM

## 2014-01-01 DIAGNOSIS — Z0000 Encounter for general adult medical examination without abnormal findings: Secondary | ICD-10-CM

## 2014-01-01 DIAGNOSIS — R82998 Other abnormal findings in urine: Secondary | ICD-10-CM

## 2014-01-01 DIAGNOSIS — E109 Type 1 diabetes mellitus without complications: Secondary | ICD-10-CM

## 2014-01-01 DIAGNOSIS — L109 Pemphigus, unspecified: Secondary | ICD-10-CM

## 2014-01-01 LAB — POCT URINALYSIS DIPSTICK
Bilirubin, UA: NEGATIVE
Glucose, UA: NEGATIVE
Ketones, UA: NEGATIVE
NITRITE UA: NEGATIVE
PH UA: 6
Spec Grav, UA: 1.02
UROBILINOGEN UA: NEGATIVE

## 2014-01-01 MED ORDER — INSULIN GLARGINE 100 UNIT/ML SOLOSTAR PEN: PEN_INJECTOR | SUBCUTANEOUS | Status: DC

## 2014-01-01 NOTE — Progress Notes (Signed)
Subjective:    Patient ID: Julie Mora, female    DOB: [birth year], 73 y.o.   MRN: [id number]  HPI 73 year old White female  with many complicated medical problems including pemphigus now on prednisone 5 mg every other day, insulin-dependent diabetes, hyperlipidemia, hypertension, dependent edema, mixed stress and urge urinary incontinence, history of recurrent urinary infection, metabolic syndrome, obesity for health maintenance and evaluation of medical issues.  History of left occipital parietal CVA in 2006. History of DVT in 2010. History of iron deficiency.  Is on chronic anticoagulation therapy with Pradaxa.  Tells me that she is in need of a corneal transplant of right eye. Left eye vision is good. His ophthalmologist at [facility].  History of elevated liver enzymes presumably due to fatty liver.  Social history: She is a widow. 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 Does not smoke or consume alcohol. Also is a former Warehouse manager. No children.  Family history: Father died at age 17 reportedly of pancreatic cancer. Mother died of complications of old age and dementia. One sister in good health. No brothers.  Diagnosis of pemphigus was made in 2010. She's had several remissions and relapses with that disease.  In November 2010 was admitted with vomiting and acute renal failure. Subsequently was readmitted the seborrheic 2010 with Clostridium difficile colitis adrenal insufficiency and anasarca due to multifactorial reasons. In early 2011 developed an infected leg ulcer left lower leg and was bed bound.  Was admitted for [facility] long ICU March 2012 with presumed sepsis and tachycardia. Blood culture grew staph aureus and urine culture grew greater than 100,000 Col/mL Escherichia coli.  Given her diabetes under control while she was on high-dose prednisone for pemphigus was a challenge.  Patient admits that she does not always  follow strict diabetic diet.    Review of Systems  Constitutional: Positive for fatigue.  HENT: Negative.   Eyes:       Poor vision right eye  Respiratory: Negative.   Cardiovascular: Negative.   Gastrointestinal: Negative.   Endocrine:       Type 2 diabetes mellitus  Genitourinary:       Recurrent urinary infections. Stress and urge urinary incontinence.  Neurological: Negative.   Hematological: Negative.   Psychiatric/Behavioral: Negative.        Objective:   Physical Exam  Vitals reviewed. Constitutional: She is oriented to person, place, and time. She appears well-developed and well-nourished. No distress.  HENT:  Head: Normocephalic and atraumatic.  Right Ear: External ear normal.  Left Ear: External ear normal.  Mouth/Throat: Oropharynx is clear and moist. No oropharyngeal exudate.  Eyes: Conjunctivae are normal. Pupils are equal, round, and reactive to light. Right eye exhibits no discharge. Left eye exhibits no discharge. No scleral icterus.  Neck: Neck supple. No JVD present. No thyromegaly present.  Cardiovascular: Normal rate, regular rhythm, normal heart sounds and intact distal pulses.   No murmur heard. Pulmonary/Chest: Effort normal and breath sounds normal. No respiratory distress. She has no wheezes. She has no rales. She exhibits no tenderness.  Breasts normal female  Abdominal: Soft. Bowel sounds are normal. She exhibits no mass. There is no rebound and no guarding.  Musculoskeletal: Normal range of motion. She exhibits no edema.  Lymphadenopathy:    She has no cervical adenopathy.  Neurological: She is alert and oriented to person, place, and time. She has normal reflexes. No cranial nerve deficit. Coordination normal.  Skin: Skin is  warm and dry. She is not diaphoretic.  Psychiatric: She has a normal mood and affect. Her behavior is normal. Judgment and thought content normal.          Assessment & Plan:   Chronic anticoagulation with history  of DVT and CVA -Pradaxa  Insulin-dependent diabetes-patient to watch diet better. She wants to see endocrinologist for diabetes assessment. I think this is a good idea.  Obesity  Hyperlipidemia  Hypertension  History of pemphigus-currently on prednisone 5 mg every other day  History of recurrent urinary tract infections  Dependent edema  History of falls  Plan: Return in 6 months or as needed. Recommend annual mammogram. 3 Hemoccult cards given.   Subjective:   Patient presents for Medicare Annual/Subsequent preventive examination.  Review Past Medical/Family/Social:see above   Risk Factors  Current exercise habits: sedentary Dietary issues discussed: low fat low carb  Cardiac risk factors:HTN, hyperlipidemia, AODM  Depression Screen  (Note: if answer to either of the following is "Yes", a more complete depression screening is indicated)   Over the past two weeks, have you felt down, depressed or hopeless? No  Over the past two weeks, have you felt little interest or pleasure in doing things? No Have you lost interest or pleasure in daily life? No Do you often feel hopeless? No Do you cry easily over simple problems? No   Activities of Daily Living  In your present state of health, do you have any difficulty performing the following activities?:   Driving? No  Managing money? No  Feeding yourself? No  Getting from bed to chair? No  Climbing a flight of stairs? Yes can only climb with a hand rail Preparing food and eating?: No  Bathing or showering? No  Getting dressed: No  Getting to the toilet? No  Using the toilet:No  Moving around from place to place: No  In the past year have you fallen or had a near fall?:yes Are you sexually active? No  Do you have more than one partner? No   Hearing Difficulties: No  Do you often ask people to speak up or repeat themselves? No  Do you experience ringing or noises in your ears? No  Do you have difficulty  understanding soft or whispered voices? No  Do you feel that you have a problem with memory? No Do you often misplace items? sometimes   Home Safety:  Do you have a smoke alarm at your residence? Yes Do you have grab bars in the bathroom?yes Do you have throw rugs in your house?no   Cognitive Testing  Alert? Yes Normal Appearance?Yes  Oriented to person? Yes Place? Yes  Time? Yes  Recall of three objects? Yes  Can perform simple calculations? Yes  Displays appropriate judgment?Yes  Can read the correct time from a watch face?Yes   List the Names of Other Physician/Practitioners you currently use:  See referral list for the physicians patient is currently seeing. Dermatologist-Dr. Denna Haggard,  Dr. Alen Blew, and Eye physician [facility]    Review of Systems:see above   Objective:     General appearance: Appears stated age and mildly obese  Head: Normocephalic, without obvious abnormality, atraumatic  Eyes: conj clear, EOMi PEERLA  Ears: normal TM's and external ear canals both ears  Nose: Nares normal. Septum midline. Mucosa normal. No drainage or sinus tenderness.  Throat: lips, mucosa, and tongue normal; teeth and gums normal  Neck: no adenopathy, no carotid bruit, no JVD, supple, symmetrical, trachea midline and thyroid not enlarged,  symmetric, no tenderness/mass/nodules  No CVA tenderness.  Lungs: clear to auscultation bilaterally  Breasts: normal appearance, no masses or tenderness Heart: regular rate and rhythm, S1, S2 normal, no murmur, click, rub or gallop  Abdomen: soft, non-tender; bowel sounds normal; no masses, no organomegaly  Musculoskeletal: ROM normal in all joints, no crepitus, no deformity, Normal muscle strengthen. Back  is symmetric, no curvature. Skin: Skin color, texture, turgor normal. No rashes or lesions  Lymph nodes: Cervical, supraclavicular, and axillary nodes normal.  Neurologic: CN 2 -12 Normal, Normal symmetric reflexes. Normal coordination and gait    Psych: Alert & Oriented x 3, Mood appear stable.    Assessment:    Annual wellness medicare exam   Plan:    During the course of the visit the patient was educated and counseled about appropriate screening and preventive services including:   Annual mammogram  3 Hemoccult cards given     Patient Instructions (the written plan) was given to the patient.  Medicare Attestation  I have personally reviewed:  The patient's medical and social history  Their use of alcohol, tobacco or illicit drugs  Their current medications and supplements  The patient's functional ability including ADLs,fall risks, home safety risks, cognitive, and hearing and visual impairment  Diet and physical activities  Evidence for depression or mood disorders  The patient's weight, height, BMI, and visual acuity have been recorded in the chart. I have made referrals, counseling, and provided education to the patient based on review of the above and I have provided the patient with a written personalized care plan for preventive services.

## 2014-01-01 NOTE — Patient Instructions (Addendum)
Appointment with endocrinologist. Return in 6 months.

## 2014-01-03 LAB — URINE CULTURE: Colony Count: >=100000

## 2014-01-05 ENCOUNTER — Other Ambulatory Visit: Payer: Self-pay

## 2014-01-05 MED ORDER — CIPROFLOXACIN HCL 250 MG PO TABS: 250.0000 mg | ORAL_TABLET | Freq: Two times a day (BID) | ORAL | Status: DC

## 2014-01-05 NOTE — Progress Notes (Signed)
Patient informed. 

## 2014-01-14 ENCOUNTER — Other Ambulatory Visit: Payer: Self-pay

## 2014-01-20 ENCOUNTER — Other Ambulatory Visit: Payer: Medicare Other | Admitting: Internal Medicine

## 2014-01-22 ENCOUNTER — Other Ambulatory Visit (INDEPENDENT_AMBULATORY_CARE_PROVIDER_SITE_OTHER): Payer: Medicare Other | Admitting: Internal Medicine

## 2014-01-22 ENCOUNTER — Encounter: Payer: Self-pay | Admitting: Internal Medicine

## 2014-01-22 VITALS — Temp 99.2°F

## 2014-01-22 DIAGNOSIS — N390 Urinary tract infection, site not specified: Secondary | ICD-10-CM

## 2014-01-22 DIAGNOSIS — Z1211 Encounter for screening for malignant neoplasm of colon: Secondary | ICD-10-CM

## 2014-01-22 LAB — POCT URINALYSIS DIPSTICK
BILIRUBIN UA: NEGATIVE
GLUCOSE UA: NEGATIVE
Ketones, UA: NEGATIVE
Nitrite, UA: NEGATIVE
SPEC GRAV UA: 1.015
Urobilinogen, UA: NEGATIVE
pH, UA: 6

## 2014-01-22 LAB — HEMOCCULT GUIAC POC 1CARD (OFFICE)
Card #2 Fecal Occult Blod, POC: NEGATIVE
Card #3 Fecal Occult Blood, POC: NEGATIVE
Fecal Occult Blood, POC: NEGATIVE

## 2014-01-22 NOTE — Progress Notes (Signed)
Left message to call us

## 2014-01-22 NOTE — Progress Notes (Signed)
Urine still appears to be infected. Patient is asymptomatic. Sent for culture again.

## 2014-01-23 NOTE — Progress Notes (Signed)
Patient informed. 

## 2014-01-24 LAB — URINE CULTURE

## 2014-01-26 ENCOUNTER — Other Ambulatory Visit: Payer: Self-pay

## 2014-01-26 MED ORDER — AMOXICILLIN 500 MG PO CAPS: 500.0000 mg | ORAL_CAPSULE | Freq: Three times a day (TID) | ORAL | Status: DC

## 2014-01-26 NOTE — Progress Notes (Signed)
Patient informed. Rx sent to [facility]. Will do Urology consult also. Patient is aware.

## 2014-02-12 ENCOUNTER — Other Ambulatory Visit (INDEPENDENT_AMBULATORY_CARE_PROVIDER_SITE_OTHER): Payer: Medicare Other | Admitting: Internal Medicine

## 2014-02-12 VITALS — Temp 99.8°F

## 2014-02-12 DIAGNOSIS — N390 Urinary tract infection, site not specified: Secondary | ICD-10-CM

## 2014-02-12 LAB — POCT URINALYSIS DIPSTICK
BILIRUBIN UA: NEGATIVE
GLUCOSE UA: NEGATIVE
KETONES UA: NEGATIVE
Nitrite, UA: NEGATIVE
PH UA: 6
Spec Grav, UA: 1.02
Urobilinogen, UA: NEGATIVE

## 2014-02-12 NOTE — Patient Instructions (Signed)
Patient again has UTI. Per Dr. Renold Genta, since she is seeing the urologist on Monday 02/16/2014, we will not send for culture. Patient informed.

## 2014-03-09 ENCOUNTER — Other Ambulatory Visit: Payer: Self-pay | Admitting: Internal Medicine

## 2014-03-13 ENCOUNTER — Other Ambulatory Visit: Payer: Self-pay | Admitting: Urology

## 2014-03-13 DIAGNOSIS — N281 Cyst of kidney, acquired: Secondary | ICD-10-CM

## 2014-03-27 ENCOUNTER — Ambulatory Visit
Admission: RE | Admit: 2014-03-27 | Discharge: 2014-03-27 | Disposition: A | Discharge status: Home / Self Care | Payer: Medicare Other | Source: Ambulatory Visit | Attending: Urology | Admitting: Urology

## 2014-03-27 ENCOUNTER — Inpatient Hospital Stay (HOSPITAL_COMMUNITY)
Admission: RE | Admit: 2014-03-27 | Discharge: 2014-03-27 | Disposition: A | Discharge status: Home / Self Care | Payer: Medicare Other | Source: Ambulatory Visit | Attending: Urology | Admitting: Urology

## 2014-03-27 DIAGNOSIS — N281 Cyst of kidney, acquired: Secondary | ICD-10-CM

## 2014-03-27 MED ORDER — GADOBENATE DIMEGLUMINE 529 MG/ML IV SOLN
10.0000 mL | Freq: Once | INTRAVENOUS | Status: AC | PRN
  Administered 2014-03-27: 10 mL via INTRAVENOUS

## 2014-05-12 ENCOUNTER — Other Ambulatory Visit: Payer: Self-pay | Admitting: Internal Medicine

## 2014-05-26 ENCOUNTER — Other Ambulatory Visit: Payer: Medicare Other | Admitting: Internal Medicine

## 2014-05-26 DIAGNOSIS — E109 Type 1 diabetes mellitus without complications: Secondary | ICD-10-CM

## 2014-05-26 DIAGNOSIS — I10 Essential (primary) hypertension: Secondary | ICD-10-CM

## 2014-05-26 DIAGNOSIS — E785 Hyperlipidemia, unspecified: Secondary | ICD-10-CM

## 2014-05-26 LAB — COMPREHENSIVE METABOLIC PANEL
ALBUMIN: 3.5 g/dL (ref 3.5–5.2)
ALK PHOS: 55 U/L (ref 39–117)
ALT: 12 U/L (ref 0–35)
AST: 16 U/L (ref 0–37)
BUN: 17 mg/dL (ref 6–23)
CALCIUM: 9.2 mg/dL (ref 8.4–10.5)
CO2: 26 mEq/L (ref 19–32)
Chloride: 110 mEq/L (ref 96–112)
Creat: 1 mg/dL (ref 0.50–1.10)
Glucose, Bld: 101 mg/dL — ABNORMAL HIGH (ref 70–99)
POTASSIUM: 4.1 meq/L (ref 3.5–5.3)
Sodium: 145 mEq/L (ref 135–145)
Total Bilirubin: 0.4 mg/dL (ref 0.2–1.2)
Total Protein: 6.1 g/dL (ref 6.0–8.3)

## 2014-05-26 LAB — LIPID PANEL
CHOL/HDL RATIO: 2.8 ratio
CHOLESTEROL: 114 mg/dL (ref 0–200)
HDL: 41 mg/dL (ref >39)
LDL Cholesterol: 53 mg/dL (ref 0–99)
Triglycerides: 102 mg/dL (ref <150)
VLDL: 20 mg/dL (ref 0–40)

## 2014-05-26 LAB — HEMOGLOBIN A1C
Hgb A1c MFr Bld: 7.4 % — ABNORMAL HIGH (ref <5.7)
Mean Plasma Glucose: 166 mg/dL — ABNORMAL HIGH (ref <117)

## 2014-07-03 ENCOUNTER — Other Ambulatory Visit: Payer: Self-pay

## 2014-07-07 ENCOUNTER — Other Ambulatory Visit: Payer: Medicare Other | Admitting: Internal Medicine

## 2014-07-09 ENCOUNTER — Encounter: Payer: Self-pay | Admitting: Internal Medicine

## 2014-07-09 ENCOUNTER — Ambulatory Visit (INDEPENDENT_AMBULATORY_CARE_PROVIDER_SITE_OTHER): Payer: Medicare Other | Admitting: Internal Medicine

## 2014-07-09 VITALS — BP 118/80 | HR 86 | Temp 99.1°F | Wt 264.0 lb

## 2014-07-09 DIAGNOSIS — Z872 Personal history of diseases of the skin and subcutaneous tissue: Secondary | ICD-10-CM

## 2014-07-09 DIAGNOSIS — Z7901 Long term (current) use of anticoagulants: Secondary | ICD-10-CM

## 2014-07-09 DIAGNOSIS — R609 Edema, unspecified: Secondary | ICD-10-CM

## 2014-07-09 DIAGNOSIS — E785 Hyperlipidemia, unspecified: Secondary | ICD-10-CM

## 2014-07-09 DIAGNOSIS — N390 Urinary tract infection, site not specified: Secondary | ICD-10-CM

## 2014-07-09 DIAGNOSIS — Z8673 Personal history of transient ischemic attack (TIA), and cerebral infarction without residual deficits: Secondary | ICD-10-CM

## 2014-07-09 DIAGNOSIS — Z23 Encounter for immunization: Secondary | ICD-10-CM

## 2014-07-09 DIAGNOSIS — E119 Type 2 diabetes mellitus without complications: Secondary | ICD-10-CM

## 2014-07-09 DIAGNOSIS — I10 Essential (primary) hypertension: Secondary | ICD-10-CM

## 2014-07-09 DIAGNOSIS — Z86718 Personal history of other venous thrombosis and embolism: Secondary | ICD-10-CM

## 2014-07-10 LAB — MICROALBUMIN, URINE: Microalb, Ur: 1.8 mg/dL (ref <2.0)

## 2014-07-12 LAB — URINE CULTURE

## 2014-07-13 ENCOUNTER — Telehealth: Payer: Self-pay

## 2014-07-13 MED ORDER — AMOXICILLIN-POT CLAVULANATE 500-125 MG PO TABS: 1.0000 | ORAL_TABLET | Freq: Three times a day (TID) | ORAL | Status: DC

## 2014-07-13 NOTE — Telephone Encounter (Signed)
Patient aware of UTI results.  Augmentin sent to pharmacy.  Follow up appointment made for 07/27/2014 at 1045.

## 2014-07-13 NOTE — Telephone Encounter (Signed)
Message copied by Amado Coe on Mon Jul 13, 2014 11:53 AM ------      Message from: Elby Showers      Created: Mon Jul 13, 2014 11:34 AM       Pt has UTI. i am going to treat with Augmentin 500 mg tid x 10 days with repeat clean catch specimen in 2 weeks. She is to maintain strict urine hygiene as discussed. Wipe from front to back, etc. ------

## 2014-07-27 ENCOUNTER — Encounter: Payer: Self-pay | Admitting: Internal Medicine

## 2014-07-27 ENCOUNTER — Ambulatory Visit (INDEPENDENT_AMBULATORY_CARE_PROVIDER_SITE_OTHER): Payer: Medicare Other | Admitting: Internal Medicine

## 2014-07-27 VITALS — BP 128/78 | HR 93 | Temp 98.8°F | Ht 62.0 in | Wt 264.0 lb

## 2014-07-27 DIAGNOSIS — N390 Urinary tract infection, site not specified: Secondary | ICD-10-CM

## 2014-07-27 LAB — POCT URINALYSIS DIPSTICK
Bilirubin, UA: NEGATIVE
GLUCOSE UA: NEGATIVE
Ketones, UA: NEGATIVE
Leukocytes, UA: NEGATIVE
NITRITE UA: NEGATIVE
RBC UA: NEGATIVE
Spec Grav, UA: 1.015
UROBILINOGEN UA: NEGATIVE
pH, UA: 6

## 2014-07-27 NOTE — Patient Instructions (Addendum)
UTI has resolved. Return in Spring 2016.

## 2014-07-27 NOTE — Progress Notes (Signed)
   Subjective:    Patient ID: Julie Mora, female    DOB: July 23, 1941, 73 y.o.   MRN: [id number]  HPI  In today to follow-up on urinary tract infection. At last visit grew greater than 100,000 colonies per milliliter Escherichia coli treated with Augmentin which organism was sensitive to. Usually not aware that she has a urinary tract infection. Urinalysis today is completely normal. She says she gave a clean catch specimen but had trouble giving large amount of urine.    Review of Systems     Objective:   Physical Exam  Not examined today. Urine specimen reviewed.      Assessment & Plan:  Recurrent urinary tract infections  Plan: She is to return in the spring for six-month recheck and continue current medications. She has had a recent diabetic eye exam at [facility]. A copy has been received and will be scanned into Epic. Also she recently saw her endocrinologist, Dr. Chalmers Cater renewed her diuretic. She has yet toreceive shingles vaccine although she has a prescription to do so at local pharmacy.

## 2014-09-26 NOTE — Progress Notes (Signed)
   Subjective:    Patient ID: Julie Mora, female    DOB: [birth year], 74 y.o.   MRN: [id number]  HPI  For follow-up of many medical issues including recurrent urinary infection, hypertension, hyperlipidemia, diabetes mellitus. She is under the care of Dr. Chalmers Cater, endocrinologist.  Seen at [facility] regarding recurrent urinary infections. Had complex renal cyst. MRI of the kidneys confirmed that. In August 2015 was prescribed generic Macrodantin 100 mg twice daily for 5 days for urinary tract infection.. Urologist tells her cyst does not appear to be malignant.  Patient and urologist agreed to check again in 6 months. In mid-September had another UTI treated with generic Keflex for 5 days.  Patient had diabetic foot exam by podiatrist, Dr. Prudence Davidson, October 2015.  She had diabetic eye exam September 2015 by Dr. Susa Simmonds at [facility]. She goes every 3 months to have her eyes checked. Dr. Denna Haggard is her dermatologist. She has a history of pemphigus. She is off prednisone and doing very well.  Endocrinologist saw her in June and September. Hemoglobin A1c has come down from 9-7.4% she says. She has reduced Lantus from 35 units to 33 units nightly.   Patient asked Dr. Chalmers Cater prescribed torsemide for her for dependent edema. She also uses sliding scale NovoLog 3 -12 units in addition to taking metformin 500 mg twice daily.  Hemoglobin A1c done 05/26/2014 is 7.4% and previously was 9% April 2015. Lipid panel is entirely within normal limits. Electrolytes are normal. BUN and creatinine are normal. Liver functions are normal.  Pemphigus was diagnosed in 2010. She's had several remissions and relapses. Patient is completely asymptomatic at this time.  She does not smoke or consume alcohol.  She is on chronic anticoagulation with per DEXA. History of left occipital parietal CVA 2006 and history of DVT in 2010.  She has hypertension, hyperlipidemia, dependent edema, mixed  stress and urge urinary continence, metabolic syndrome, obesity  In addition to diabetes.   Review of Systems     Objective:   Physical Exam  Skin warm and dry. Nodes none. Neck is supple without JVD thyromegaly or bruits. Chest clear. Cardiac exam regular rate and rhythm normal S1 and S2. Extremities without pitting edema. Has trace lower extremity edema. Diabetic foot exam reveals no ulcers or calluses       Assessment & Plan:   Insulin-dependent diabetes mellitus  Hyperlipidemia  Hypertension  History of pemphigus-stable off prednisone  History of CVA  History of DVT  Recurrent urinary infections  Stress and urge urinary incontinence  Plan: Patient is to return in 6 months for physical examination. She is doing extremely well and this is the best of seen her in some time.  0.5 minutes spent with patient

## 2014-09-26 NOTE — Patient Instructions (Signed)
Continue same medications and return in 6 months 

## 2014-11-03 ENCOUNTER — Other Ambulatory Visit: Payer: Self-pay | Admitting: Internal Medicine

## 2014-11-09 ENCOUNTER — Other Ambulatory Visit: Payer: Self-pay | Admitting: Internal Medicine

## 2014-11-23 ENCOUNTER — Other Ambulatory Visit: Payer: Self-pay | Admitting: Urology

## 2014-11-23 DIAGNOSIS — N281 Cyst of kidney, acquired: Secondary | ICD-10-CM

## 2014-12-09 ENCOUNTER — Ambulatory Visit
Admission: RE | Admit: 2014-12-09 | Discharge: 2014-12-09 | Disposition: A | Discharge status: Home / Self Care | Payer: No Typology Code available for payment source | Source: Ambulatory Visit | Attending: Urology | Admitting: Urology

## 2014-12-09 DIAGNOSIS — N281 Cyst of kidney, acquired: Secondary | ICD-10-CM

## 2014-12-09 MED ORDER — GADOBENATE DIMEGLUMINE 529 MG/ML IV SOLN
20.0000 mL | Freq: Once | INTRAVENOUS | Status: AC | PRN
Start: 2014-12-09 — End: 2014-12-09
  Administered 2014-12-09: 20 mL via INTRAVENOUS

## 2014-12-15 ENCOUNTER — Encounter: Payer: Self-pay | Admitting: Internal Medicine

## 2014-12-15 ENCOUNTER — Ambulatory Visit (INDEPENDENT_AMBULATORY_CARE_PROVIDER_SITE_OTHER): Payer: Medicare Other | Admitting: Internal Medicine

## 2014-12-15 VITALS — BP 130/90 | HR 110 | Temp 98.6°F | Wt 267.0 lb

## 2014-12-15 DIAGNOSIS — E119 Type 2 diabetes mellitus without complications: Secondary | ICD-10-CM | POA: Diagnosis not present

## 2014-12-15 DIAGNOSIS — M810 Age-related osteoporosis without current pathological fracture: Secondary | ICD-10-CM

## 2014-12-15 DIAGNOSIS — Z794 Long term (current) use of insulin: Secondary | ICD-10-CM | POA: Diagnosis not present

## 2014-12-15 DIAGNOSIS — IMO0001 Reserved for inherently not codable concepts without codable children: Secondary | ICD-10-CM

## 2014-12-15 MED ORDER — ALENDRONATE SODIUM 70 MG PO TABS: 70.0000 mg | ORAL_TABLET | ORAL | Status: DC

## 2014-12-15 MED ORDER — INSULIN ASPART 100 UNIT/ML ~~LOC~~ SOLN: 5.0000 [IU] | Freq: Two times a day (BID) | SUBCUTANEOUS | Status: DC | PRN

## 2014-12-15 NOTE — Progress Notes (Signed)
   Subjective:    Patient ID: Julie Mora, female    DOB: 1940/11/24, 74 y.o.   MRN: [id number]  HPI Patient had a recent bone density study and had a T score of -3.2 in the left femur. She took prednisone for a number of years for pemphigus. Last year her vitamin D level was low at 21. She was reminded to take over-the-counter vitamin D3. She is due for physical exam in the near future and will have vitamin D level checked again. We have discussed [facility] options for osteoporosis. She is comfortable trying Fosamax weekly. Explained the side effects to her. She has fallen recently. She is at high risk for hip fracture. Mother had history of osteoporosis. She is taking calcium supplement as well as vitamin D supplement.    Review of Systems     Objective:   Physical Exam  Not examined today. Spent 15 minutes reviewing bone density study in talking about options for treatment of osteoporosis      Assessment & Plan:  Osteoporosis  Diabetes mellitus-to see Dr. Chalmers Cater next week. Needs refill on NovoLog.  Plan: Start Fosamax 70 mg weekly. Reevaluate DEXA scan in 1 year.

## 2014-12-15 NOTE — Patient Instructions (Signed)
Start Fosamax 70 mg weekly. Keep appointment for physical examination in April. Vitamin D level will be checked at that time.

## 2015-01-11 ENCOUNTER — Other Ambulatory Visit: Payer: Self-pay | Admitting: Internal Medicine

## 2015-01-12 ENCOUNTER — Other Ambulatory Visit: Payer: Medicare Other | Admitting: Internal Medicine

## 2015-01-12 DIAGNOSIS — E785 Hyperlipidemia, unspecified: Secondary | ICD-10-CM

## 2015-01-12 DIAGNOSIS — E109 Type 1 diabetes mellitus without complications: Secondary | ICD-10-CM

## 2015-01-12 DIAGNOSIS — E559 Vitamin D deficiency, unspecified: Secondary | ICD-10-CM

## 2015-01-12 DIAGNOSIS — R5383 Other fatigue: Secondary | ICD-10-CM

## 2015-01-12 DIAGNOSIS — Z79899 Other long term (current) drug therapy: Secondary | ICD-10-CM

## 2015-01-12 LAB — CBC WITH DIFFERENTIAL/PLATELET
Basophils Absolute: 0 10*3/uL (ref 0.0–0.1)
Basophils Relative: 0 % (ref 0–1)
EOS ABS: 0.4 10*3/uL (ref 0.0–0.7)
Eosinophils Relative: 4 % (ref 0–5)
HCT: 41.5 % (ref 36.0–46.0)
Hemoglobin: 13.5 g/dL (ref 12.0–15.0)
LYMPHS PCT: 25 % (ref 12–46)
Lymphs Abs: 2.3 10*3/uL (ref 0.7–4.0)
MCH: 31.3 pg (ref 26.0–34.0)
MCHC: 32.5 g/dL (ref 30.0–36.0)
MCV: 96.3 fL (ref 78.0–100.0)
MPV: 9.7 fL (ref 8.6–12.4)
Monocytes Absolute: 0.8 10*3/uL (ref 0.1–1.0)
Monocytes Relative: 9 % (ref 3–12)
NEUTROS ABS: 5.6 10*3/uL (ref 1.7–7.7)
NEUTROS PCT: 62 % (ref 43–77)
Platelets: 252 10*3/uL (ref 150–400)
RBC: 4.31 MIL/uL (ref 3.87–5.11)
RDW: 13.5 % (ref 11.5–15.5)
WBC: 9 10*3/uL (ref 4.0–10.5)

## 2015-01-12 LAB — HEMOGLOBIN A1C
Hgb A1c MFr Bld: 9 % — ABNORMAL HIGH (ref <5.7)
MEAN PLASMA GLUCOSE: 212 mg/dL — AB (ref <117)

## 2015-01-13 LAB — COMPLETE METABOLIC PANEL WITH GFR
ALT: 13 U/L (ref 0–35)
AST: 18 U/L (ref 0–37)
Albumin: 3.7 g/dL (ref 3.5–5.2)
Alkaline Phosphatase: 57 U/L (ref 39–117)
BUN: 23 mg/dL (ref 6–23)
CHLORIDE: 107 meq/L (ref 96–112)
CO2: 26 mEq/L (ref 19–32)
Calcium: 9.4 mg/dL (ref 8.4–10.5)
Creat: 0.99 mg/dL (ref 0.50–1.10)
GFR, Est African American: 65 mL/min
GFR, Est Non African American: 57 mL/min — ABNORMAL LOW
GLUCOSE: 133 mg/dL — AB (ref 70–99)
POTASSIUM: 4.9 meq/L (ref 3.5–5.3)
Sodium: 142 mEq/L (ref 135–145)
TOTAL PROTEIN: 6.5 g/dL (ref 6.0–8.3)
Total Bilirubin: 0.4 mg/dL (ref 0.2–1.2)

## 2015-01-13 LAB — LIPID PANEL
CHOL/HDL RATIO: 3 ratio
Cholesterol: 124 mg/dL (ref 0–200)
HDL: 41 mg/dL — AB (ref >=46)
LDL CALC: 44 mg/dL (ref 0–99)
TRIGLYCERIDES: 195 mg/dL — AB (ref <150)
VLDL: 39 mg/dL (ref 0–40)

## 2015-01-13 LAB — TSH: TSH: 2.261 u[IU]/mL (ref 0.350–4.500)

## 2015-01-13 LAB — VITAMIN D 25 HYDROXY (VIT D DEFICIENCY, FRACTURES): VIT D 25 HYDROXY: 22 ng/mL — AB (ref 30–100)

## 2015-01-14 ENCOUNTER — Ambulatory Visit (INDEPENDENT_AMBULATORY_CARE_PROVIDER_SITE_OTHER): Payer: Medicare Other | Admitting: Internal Medicine

## 2015-01-14 ENCOUNTER — Encounter: Payer: Self-pay | Admitting: Internal Medicine

## 2015-01-14 VITALS — BP 144/88 | HR 88 | Temp 98.9°F | Ht 63.0 in | Wt 271.0 lb

## 2015-01-14 DIAGNOSIS — Q6100 Congenital renal cyst, unspecified: Secondary | ICD-10-CM | POA: Diagnosis not present

## 2015-01-14 DIAGNOSIS — I10 Essential (primary) hypertension: Secondary | ICD-10-CM | POA: Diagnosis not present

## 2015-01-14 DIAGNOSIS — M810 Age-related osteoporosis without current pathological fracture: Secondary | ICD-10-CM | POA: Diagnosis not present

## 2015-01-14 DIAGNOSIS — E8881 Metabolic syndrome: Secondary | ICD-10-CM

## 2015-01-14 DIAGNOSIS — Z872 Personal history of diseases of the skin and subcutaneous tissue: Secondary | ICD-10-CM

## 2015-01-14 DIAGNOSIS — Z0000 Encounter for general adult medical examination without abnormal findings: Secondary | ICD-10-CM | POA: Diagnosis not present

## 2015-01-14 DIAGNOSIS — N39498 Other specified urinary incontinence: Secondary | ICD-10-CM

## 2015-01-14 DIAGNOSIS — Z92241 Personal history of systemic steroid therapy: Secondary | ICD-10-CM

## 2015-01-14 DIAGNOSIS — E1165 Type 2 diabetes mellitus with hyperglycemia: Secondary | ICD-10-CM | POA: Diagnosis not present

## 2015-01-14 DIAGNOSIS — N183 Chronic kidney disease, stage 3 unspecified: Secondary | ICD-10-CM

## 2015-01-14 DIAGNOSIS — E785 Hyperlipidemia, unspecified: Secondary | ICD-10-CM | POA: Diagnosis not present

## 2015-01-14 DIAGNOSIS — K8020 Calculus of gallbladder without cholecystitis without obstruction: Secondary | ICD-10-CM | POA: Diagnosis not present

## 2015-01-14 DIAGNOSIS — N281 Cyst of kidney, acquired: Secondary | ICD-10-CM

## 2015-01-14 DIAGNOSIS — Z8673 Personal history of transient ischemic attack (TIA), and cerebral infarction without residual deficits: Secondary | ICD-10-CM

## 2015-01-14 DIAGNOSIS — L720 Epidermal cyst: Secondary | ICD-10-CM

## 2015-01-14 DIAGNOSIS — N390 Urinary tract infection, site not specified: Secondary | ICD-10-CM

## 2015-01-14 MED ORDER — ERGOCALCIFEROL 1.25 MG (50000 UT) PO CAPS: 50000.0000 [IU] | ORAL_CAPSULE | ORAL | Status: DC

## 2015-01-14 NOTE — Patient Instructions (Addendum)
Please work to get diabetes under better control. Prevnar 13 to be given next month with repeat blood pressure check. Return in 6 months for office visit, lipid panel liver functions. Dr. Chalmers Cater will follow diabetes

## 2015-01-14 NOTE — Progress Notes (Signed)
Subjective:    Patient ID: Julie Mora, female    DOB: 1941-05-17, 74 y.o.   MRN: [id number]  HPI 74 year old White female with history of many complex medical problems including history of pemphigus requiring chronic steroid therapy. Now off steroids. History of osteoporosis related to steroid therapy. History of cholelithiasis with stone noted in bile duct to see surgeon in the near future. History of diabetes mellitus, complex renal cyst followed by urology, recurrent urinary infections, metabolic syndrome, morbid obesity, dependent edema, urinary incontinence.  Patient says she was depressed for about 6 months. Sold farm she inherited from her husband who is deceased. Says she'll let her glucose get out of control. Dr. Chalmers Cater is her endocrinologist. Patient says she quit taking metformin when she was depressed. She is on Lantus 30 units nightly, NovoLog flex PN per sliding scale 4 times daily. Hemoglobin A1c is 9%. Says recently she has  come out of her depression and is doing better. Taking better care of herself. Has not had colonoscopy. 3 Hemoccult cards given.  She has chronic kidney disease related to diabetes and hypertension. Estimated GFR was 43 mL/m June 2015.  She has had recent mammogram. She has also seen ophthalmologist recently as well.  Is due for Prevnar vaccine and that will be done next month. Tetanus immunization due December 2006. Has refused to take Zostavax vaccine because of side effects.  By April 2015 she was down to prednisone 5 mg every other day for pemphigus and is now off prednisone.  She is on chronic anticoagulation with Pradaxa for history of left occipital parietal CVA in 2006 and history of DVT in 2010.  Remote history of iron deficiency.  She sees ophthalmologist at [facility]. Says she needs corneal transplant of right eye. Left vision is good.  History of elevated liver enzymes due to fatty liver.  Diagnosis of pemphigus  was made in 2010. She's had several remissions and relapses.  In November 2010 she was admitted with vomiting and acute renal failure. Subsequently was readmitted in 2010 with Clostridium difficile colitis, adrenal insufficiency, and anasarca due to multifactorial reasons. In early 2011 she developed an infected leg ulcer left lower leg and was bedbound.  She was admitted to [facility] long ICU March 2012 for presumed sepsis and tachycardia. Blood culture grew staph aureus and urine culture grew greater than 100,000 colonies per milliliter Escherichia coli.  Social history: She is a widow. 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 Does not smoke or consume alcohol. She is a former Warehouse manager. No children.  Family history: Father died at age 40 reportedly of pancreatic cancer. Mother died of complications of old age and dementia. One sister in good health. No brothers.      Review of Systems history of depression for about 6 months. Has seen Dr. Janice Norrie  recently regarding complex cyst in kidney. It will continue to be monitored. During Dr. Sammie Bench recent radiological exam he discovered she has cholelithiasis and possibly a retained stone in bile duct. She is to see surgeon soon. History of pemphigus. This is now in remission and followed by Dr. Denna Haggard. History of osteoporosis treated with Fosamax. History of stress and urge urinary incontinence and has to wear protective underwear.     Objective:   Physical Exam  Constitutional: She is oriented to person, place, and time. She appears well-developed and well-nourished. No distress.  Patient was reluctant to get on exam table  but we persuaded her to do so with assistance  HENT:  Head: Normocephalic and atraumatic.  Right Ear: External ear normal.  Left Ear: External ear normal.  Mouth/Throat: Oropharynx is clear and moist. No oropharyngeal exudate.  Eyes: Conjunctivae and EOM are normal. Pupils are equal, round, and  reactive to light. Right eye exhibits no discharge. Left eye exhibits no discharge. No scleral icterus.  Neck: Neck supple. No JVD present. No thyromegaly present.  Cardiovascular: Normal rate, regular rhythm and intact distal pulses.   Murmur heard. Pulmonary/Chest: Effort normal and breath sounds normal. No respiratory distress. She has no wheezes. She has no rales.  Breasts normal female. Has intertrigo under breast  Abdominal: Soft. Bowel sounds are normal. She exhibits no distension and no mass. There is no rebound and no guarding.  Genitourinary:  Bimanual normal  Musculoskeletal:  She has some dependent edema but it is nonpitting  Lymphadenopathy:    She has no cervical adenopathy.  Neurological: She is alert and oriented to person, place, and time. She has normal reflexes. No cranial nerve deficit.  Skin: Skin is warm and dry. She is not diaphoretic.  Large sebaceous cyst right upper back  Psychiatric: She has a normal mood and affect. Her behavior is normal. Judgment and thought content normal.  Vitals reviewed.         Assessment & Plan:  Essential hypertension  Hyperlipidemia  Obesity  History of pemphigus-currently off prednisone  History of recurrent urinary tract infections  Urge and stress urinary incontinence  History of complex renal cyst  Dependent edema  Chronic kidney disease  Chronic anticoagulation with history of DVT and CVA  Insulin-dependent diabetes  Cholelithiasis  Plan: She will return here in 6 months. She is to see general surgeon soon regarding cholelithiasis. We'll continue to see urologist. Hopefully she can get her diabetes under better control with Dr. balance help. She will have Prevnar soon. Recommend annual mammogram. 3 Hemoccult cards given.  Subjective:   Patient presents for Medicare Annual/Subsequent preventive examination.  Review Past Medical/Family/Social:see above   Risk Factors  Current exercise habits:  sedentary Dietary issues discussed: low fat low carb- has been eating candy  Cardiac risk factors:   Depression Screen  (Note: if answer to either of the following is "Yes", a more complete depression screening is indicated)   Over the past two weeks, have you felt down, depressed or hopeless? No  Over the past two weeks, have you felt little interest or pleasure in doing things? No Have you lost interest or pleasure in daily life? No Do you often feel hopeless? No Do you cry easily over simple problems? No   Activities of Daily Living  In your present state of health, do you have any difficulty performing the following activities?:   Driving? No  Managing money? No  Feeding yourself? No  Getting from bed to chair? No  Climbing a flight of stairs? yes Preparing food and eating?: No  Bathing or showering? No  Getting dressed: No  Getting to the toilet? No  Using the toilet:No  Moving around from place to place: No  In the past year have you fallen or had a near fall?:No  Are you sexually active? No  Do you have more than one partner? No   Hearing Difficulties: No  Do you often ask people to speak up or repeat themselves? sometimes Do you experience ringing or noises in your ears? No  Do you have difficulty understanding soft or whispered  voices? sometimes Do you feel that you have a problem with memory? No Do you often misplace items?sometimes   Home Safety:  Do you have a smoke alarm at your residence? Yes Do you have grab bars in the bathroom? yes Do you have throw rugs in your house?no   Cognitive Testing  Alert? Yes Normal Appearance?Yes  Oriented to person? Yes Place? Yes  Time? Yes  Recall of three objects? Yes  Can perform simple calculations? Yes  Displays appropriate judgment?Yes  Can read the correct time from a watch face?Yes   List the Names of Other Physician/Practitioners you currently use:  See referral list for the physicians patient is currently  seeing.  Dr. Soyla Murphy Dr. Janice Norrie  Dr Denna Haggard Opthalmologist at [facility]   Review of Systems:see above   Objective:     General appearance: Appears stated age and obese  Head: Normocephalic, without obvious abnormality, atraumatic  Eyes: conj clear, EOMi PEERLA  Ears: normal TM's and external ear canals both ears  Nose: Nares normal. Septum midline. Mucosa normal. No drainage or sinus tenderness.  Throat: lips, mucosa, and tongue normal; teeth and gums normal  Neck: no adenopathy, no carotid bruit, no JVD, supple, symmetrical, trachea midline and thyroid not enlarged, symmetric, no tenderness/mass/nodules  No CVA tenderness.  Lungs: clear to auscultation bilaterally  Breasts: normal appearance, no masses or tenderness Heart: regular rate and rhythm, S1, S2 normal click, rub or gallop  Abdomen: soft, non-tender; bowel sounds normal; no masses, no organomegaly  Musculoskeletal: ROM normal in all joints, no crepitus, no deformity, Normal muscle strengthen. Back  is symmetric, no curvature. Skin: Skin color, texture, turgor normal. No rashes . Large sebaceous cyst right upper back Lymph nodes: Cervical, supraclavicular, and axillary nodes normal.  Neurologic: CN 2 -12 Normal, Normal symmetric reflexes. Normal coordination and gait  Psych: Alert & Oriented x 3, Mood appear stable.    Assessment:    Annual wellness medicare exam   Plan:    During the course of the visit the patient was educated and counseled about appropriate screening and preventive services including:   Annual mammogram  3 Hemoccult cards  To get Prevnar next month     Patient Instructions (the written plan) was given to the patient.  Medicare Attestation  I have personally reviewed:  The patient's medical and social history  Their use of alcohol, tobacco or illicit drugs  Their current medications and supplements  The patient's functional ability including ADLs,fall risks, home safety risks, cognitive, and  hearing and visual impairment  Diet and physical activities  Evidence for depression or mood disorders  The patient's weight, height, BMI, and visual acuity have been recorded in the chart. I have made referrals, counseling, and provided education to the patient based on review of the above and I have provided the patient with a written personalized care plan for preventive services.

## 2015-01-15 ENCOUNTER — Encounter: Payer: Medicare Other | Admitting: Internal Medicine

## 2015-02-05 ENCOUNTER — Encounter: Payer: Self-pay | Admitting: Internal Medicine

## 2015-03-15 ENCOUNTER — Other Ambulatory Visit: Payer: Self-pay

## 2015-03-21 ENCOUNTER — Other Ambulatory Visit: Payer: Self-pay | Admitting: Internal Medicine

## 2015-03-31 ENCOUNTER — Telehealth: Payer: Self-pay | Admitting: Podiatry

## 2015-03-31 ENCOUNTER — Ambulatory Visit (INDEPENDENT_AMBULATORY_CARE_PROVIDER_SITE_OTHER): Payer: Medicare Other | Admitting: Podiatry

## 2015-03-31 ENCOUNTER — Encounter: Payer: Self-pay | Admitting: Podiatry

## 2015-03-31 VITALS — BP 118/74 | HR 125 | Resp 18

## 2015-03-31 DIAGNOSIS — M79676 Pain in unspecified toe(s): Secondary | ICD-10-CM | POA: Diagnosis not present

## 2015-03-31 DIAGNOSIS — I739 Peripheral vascular disease, unspecified: Secondary | ICD-10-CM

## 2015-03-31 DIAGNOSIS — M79674 Pain in right toe(s): Secondary | ICD-10-CM

## 2015-03-31 DIAGNOSIS — B351 Tinea unguium: Secondary | ICD-10-CM | POA: Diagnosis not present

## 2015-03-31 DIAGNOSIS — M79675 Pain in left toe(s): Secondary | ICD-10-CM

## 2015-03-31 NOTE — Progress Notes (Signed)
Patient ID: Julie Mora, female   DOB: 1941-08-01, 74 y.o.   MRN: [id number] HPI  Complaint:  Visit Type: Patient returns to my office for continued preventative foot care services. Complaint: Patient states" my nails have grown long and thick and become painful to walk and wear shoes" Patient has been diagnosed with DM with -vascular complications. He presents for preventative foot care services. No changes to ROS.  She also has diabetic neuropathy.  Podiatric Exam: Vascular: dorsalis pedis and posterior tibial pulses are negative. Capillary return is immediate. Temperature gradient is negative. Skin turgor WNL, bilateral swelling  Sensorium: Diminished  Semmes Weinstein monofilament test. Normal tactile sensation bilaterally.  Nail Exam: Pt has thick disfigured discolored nails with subungual debris noted bilateral entire nail hallux through fifth toenails Ulcer Exam: There is no evidence of ulcer or pre-ulcerative changes or infection. Orthopedic Exam: Muscle tone and strength are WNL. No limitations in general ROM. No crepitus or effusions noted. Foot type and digits show no abnormalities. Bony prominences are unremarkable. Skin: No Porokeratosis. No infection or ulcers  Diagnosis:  Tinea unguium, Pain in right toe, pain in left toes  Treatment & Plan Procedures and Treatment: Consent by patient was obtained for treatment procedures. The patient understood the discussion of treatment and procedures well. All questions were answered thoroughly reviewed. Debridement of mycotic and hypertrophic toenails, 1 through 5 bilateral and clearing of subungual debris. No ulceration, no infection noted. Her fifth toenail right foot had blood blister which drained upon debridement of fifth toenail right foot. Return Visit-Office Procedure: Patient instructed to return to the office for a follow up visit 3 months for continued evaluation and treatment.

## 2015-03-31 NOTE — Telephone Encounter (Signed)
Patient called wanting to know the soaking instructions from having a toenail procedure done today. She asked to be call this afternoon as she has another appointment this morning.

## 2015-04-02 NOTE — Telephone Encounter (Signed)
Called pt back and talked to her about soaking.

## 2015-05-25 ENCOUNTER — Other Ambulatory Visit: Payer: Self-pay | Admitting: Internal Medicine

## 2015-06-01 ENCOUNTER — Ambulatory Visit: Payer: Self-pay | Admitting: Internal Medicine

## 2015-06-08 ENCOUNTER — Ambulatory Visit: Payer: Self-pay | Admitting: Internal Medicine

## 2015-06-15 ENCOUNTER — Ambulatory Visit (INDEPENDENT_AMBULATORY_CARE_PROVIDER_SITE_OTHER): Payer: Medicare Other | Admitting: Internal Medicine

## 2015-06-15 VITALS — BP 126/86 | Temp 98.0°F

## 2015-06-15 DIAGNOSIS — Z23 Encounter for immunization: Secondary | ICD-10-CM | POA: Diagnosis not present

## 2015-06-16 ENCOUNTER — Ambulatory Visit (INDEPENDENT_AMBULATORY_CARE_PROVIDER_SITE_OTHER): Payer: Medicare Other | Admitting: Podiatry

## 2015-06-16 ENCOUNTER — Encounter: Payer: Self-pay | Admitting: Podiatry

## 2015-06-16 DIAGNOSIS — M79675 Pain in left toe(s): Secondary | ICD-10-CM | POA: Diagnosis not present

## 2015-06-16 DIAGNOSIS — M79676 Pain in unspecified toe(s): Secondary | ICD-10-CM

## 2015-06-16 DIAGNOSIS — R609 Edema, unspecified: Secondary | ICD-10-CM

## 2015-06-16 DIAGNOSIS — B351 Tinea unguium: Secondary | ICD-10-CM | POA: Diagnosis not present

## 2015-06-16 DIAGNOSIS — I739 Peripheral vascular disease, unspecified: Secondary | ICD-10-CM

## 2015-06-16 NOTE — Progress Notes (Signed)
Patient ID: Julie Mora, female   DOB: 08/02/1941, 73 y.o.   MRN: [id number] HPI  Complaint:  Visit Type: Patient returns to my office for continued preventative foot care services. Complaint: Patient states" my nails have grown long and thick and become painful to walk and wear shoes" Patient has been diagnosed with DM with -vascular complications. He presents for preventative foot care services. No changes to ROS.  She also has diabetic neuropathy.  Podiatric Exam: Vascular: dorsalis pedis and posterior tibial pulses are negative. Capillary return is immediate. Temperature gradient is negative. Skin turgor WNL, bilateral swelling  Sensorium: Diminished  Semmes Weinstein monofilament test. Normal tactile sensation bilaterally.  Nail Exam: Pt has thick disfigured discolored nails with subungual debris noted bilateral entire nail hallux through fifth toenails Ulcer Exam: There is no evidence of ulcer or pre-ulcerative changes or infection. Orthopedic Exam: Muscle tone and strength are WNL. No limitations in general ROM. No crepitus or effusions noted. Foot type and digits show no abnormalities. Bony prominences are unremarkable. Skin: No Porokeratosis. No infection or ulcers  Diagnosis:  Tinea unguium, Pain in right toe, pain in left toes  Treatment & Plan Procedures and Treatment: Consent by patient was obtained for treatment procedures. The patient understood the discussion of treatment and procedures well. All questions were answered thoroughly reviewed. Debridement of mycotic and hypertrophic toenails, 1 through 5 bilateral and clearing of subungual debris. No ulceration, no infection noted. Her fifth toenail right foot had blood blister which drained upon debridement of fifth toenail right foot. Return Visit-Office Procedure: Patient instructed to return to the office for a follow up visit 3 months for continued evaluation and treatment.  

## 2015-06-21 DIAGNOSIS — I872 Venous insufficiency (chronic) (peripheral): Secondary | ICD-10-CM | POA: Insufficient documentation

## 2015-06-21 DIAGNOSIS — I890 Lymphedema, not elsewhere classified: Secondary | ICD-10-CM | POA: Insufficient documentation

## 2015-07-13 ENCOUNTER — Other Ambulatory Visit: Payer: Medicare Other | Admitting: Internal Medicine

## 2015-07-14 ENCOUNTER — Other Ambulatory Visit: Payer: Self-pay

## 2015-07-14 MED ORDER — DABIGATRAN ETEXILATE MESYLATE 75 MG PO CAPS: 75.0000 mg | ORAL_CAPSULE | Freq: Two times a day (BID) | ORAL | Status: DC

## 2015-07-15 ENCOUNTER — Other Ambulatory Visit: Payer: Medicare Other | Admitting: Internal Medicine

## 2015-07-15 ENCOUNTER — Ambulatory Visit: Payer: Medicare Other | Admitting: Internal Medicine

## 2015-07-15 DIAGNOSIS — E8881 Metabolic syndrome: Secondary | ICD-10-CM

## 2015-07-15 DIAGNOSIS — E559 Vitamin D deficiency, unspecified: Secondary | ICD-10-CM

## 2015-07-15 DIAGNOSIS — N183 Chronic kidney disease, stage 3 unspecified: Secondary | ICD-10-CM

## 2015-07-15 DIAGNOSIS — E785 Hyperlipidemia, unspecified: Secondary | ICD-10-CM

## 2015-07-15 LAB — HEPATIC FUNCTION PANEL
ALT: 13 U/L (ref 6–29)
AST: 20 U/L (ref 10–35)
Albumin: 3.6 g/dL (ref 3.6–5.1)
Alkaline Phosphatase: 54 U/L (ref 33–130)
Bilirubin, Direct: 0.1 mg/dL (ref <=0.2)
Indirect Bilirubin: 0.3 mg/dL (ref 0.2–1.2)
TOTAL PROTEIN: 6.9 g/dL (ref 6.1–8.1)
Total Bilirubin: 0.4 mg/dL (ref 0.2–1.2)

## 2015-07-15 LAB — LIPID PANEL
CHOL/HDL RATIO: 2.9 ratio (ref <=5.0)
Cholesterol: 126 mg/dL (ref 125–200)
HDL: 43 mg/dL — AB (ref >=46)
LDL Cholesterol: 55 mg/dL (ref <130)
TRIGLYCERIDES: 138 mg/dL (ref <150)
VLDL: 28 mg/dL (ref <30)

## 2015-07-16 ENCOUNTER — Ambulatory Visit: Payer: Medicare Other | Admitting: Internal Medicine

## 2015-07-16 LAB — VITAMIN D 25 HYDROXY (VIT D DEFICIENCY, FRACTURES): VIT D 25 HYDROXY: 29 ng/mL — AB (ref 30–100)

## 2015-07-20 ENCOUNTER — Ambulatory Visit (INDEPENDENT_AMBULATORY_CARE_PROVIDER_SITE_OTHER): Payer: Medicare Other | Admitting: Internal Medicine

## 2015-07-20 ENCOUNTER — Encounter: Payer: Self-pay | Admitting: Internal Medicine

## 2015-07-20 VITALS — BP 128/90 | HR 116 | Temp 99.9°F | Resp 22 | Ht 63.0 in | Wt 264.0 lb

## 2015-07-20 DIAGNOSIS — E785 Hyperlipidemia, unspecified: Secondary | ICD-10-CM

## 2015-07-20 DIAGNOSIS — N390 Urinary tract infection, site not specified: Secondary | ICD-10-CM | POA: Diagnosis not present

## 2015-07-20 DIAGNOSIS — I10 Essential (primary) hypertension: Secondary | ICD-10-CM

## 2015-07-20 DIAGNOSIS — E669 Obesity, unspecified: Secondary | ICD-10-CM | POA: Diagnosis not present

## 2015-07-20 DIAGNOSIS — Z8619 Personal history of other infectious and parasitic diseases: Secondary | ICD-10-CM

## 2015-07-20 DIAGNOSIS — Z86718 Personal history of other venous thrombosis and embolism: Secondary | ICD-10-CM | POA: Diagnosis not present

## 2015-07-20 DIAGNOSIS — L97901 Non-pressure chronic ulcer of unspecified part of unspecified lower leg limited to breakdown of skin: Secondary | ICD-10-CM

## 2015-07-20 DIAGNOSIS — N3941 Urge incontinence: Secondary | ICD-10-CM

## 2015-07-20 DIAGNOSIS — Z23 Encounter for immunization: Secondary | ICD-10-CM

## 2015-07-20 DIAGNOSIS — E119 Type 2 diabetes mellitus without complications: Secondary | ICD-10-CM | POA: Diagnosis not present

## 2015-07-20 DIAGNOSIS — E8881 Metabolic syndrome: Secondary | ICD-10-CM

## 2015-07-20 DIAGNOSIS — L089 Local infection of the skin and subcutaneous tissue, unspecified: Secondary | ICD-10-CM

## 2015-07-20 DIAGNOSIS — E559 Vitamin D deficiency, unspecified: Secondary | ICD-10-CM

## 2015-07-22 ENCOUNTER — Other Ambulatory Visit (INDEPENDENT_AMBULATORY_CARE_PROVIDER_SITE_OTHER): Payer: Medicare Other | Admitting: Internal Medicine

## 2015-07-22 DIAGNOSIS — R300 Dysuria: Secondary | ICD-10-CM

## 2015-07-22 DIAGNOSIS — M545 Low back pain: Secondary | ICD-10-CM | POA: Diagnosis not present

## 2015-07-22 LAB — POCT URINALYSIS DIPSTICK
Bilirubin, UA: NEGATIVE
Glucose, UA: NEGATIVE
Ketones, UA: NEGATIVE
NITRITE UA: NEGATIVE
PH UA: 7
PROTEIN UA: NEGATIVE
Spec Grav, UA: >=1.03
UROBILINOGEN UA: 0.2

## 2015-07-24 LAB — CULTURE, URINE COMPREHENSIVE

## 2015-07-26 ENCOUNTER — Other Ambulatory Visit: Payer: Self-pay

## 2015-07-26 MED ORDER — CIPROFLOXACIN HCL 250 MG PO TABS: 250.0000 mg | ORAL_TABLET | Freq: Two times a day (BID) | ORAL | Status: DC

## 2015-08-17 ENCOUNTER — Encounter: Payer: Self-pay | Admitting: Internal Medicine

## 2015-08-17 NOTE — Patient Instructions (Addendum)
Urine to be cultured. Continue same medications and return in 6 months. Prevnar given. Continue to see Dr. Denna Haggard, ophthalmologist, wound care Center physician, urologist and  endocrinologist

## 2015-08-17 NOTE — Progress Notes (Signed)
   Subjective:    Patient ID: Julie Mora, female    DOB: 02-Nov-1940, 74 y.o.   MRN: [id number]  HPI Patient has a history of pemphigus followed by Dr. [place]. She also has type 2 diabetes mellitus followed by Dr. Chalmers Cater. History of recurrent urinary tract infections. Chronic kidney disease stage III. Morbid obesity, hyperlipidemia, history of stroke, complex renal cyst followed by urologist, osteoporosis, urinary incontinence. History of metabolic syndrome. Chronic kidney disease related to diabetes and hypertension. Estimated GFR has been in the 40 mL range. Was on steroid therapy for long period of time.  Sees ophthalmologist at [facility]. Apparently needs corneal transplant right eye.Marland Kitchen  History of elevated liver enzymes due to fatty liver.  Diagnosis of pemphigus was made in 2010. She's had several remissions and relapses. In 2010 was diagnosed with C. difficile colitis and adrenal insufficiency with anasarca due to multifactorial reasons. In early 2011 she developed an infected leg ulcer left lower leg and was bedbound for some time. In March 2012 she was admitted to [facility] ICU with sepsis and tachycardia. Blood culture grew Staph aureus and urine culture grew Escherichia coli  Now has another infected leg ulcer being treated by Dr. Denna Haggard and wound care Center at [facility]  History of chronic anticoagulation with history of DVT and CVA  History of asymptomatic cholelithiasis.  Review of Systems     Objective:   Physical Exam Neck is supple without JVD thyromegaly or carotid bruits. Chest clear to auscultation. Cardiac exam regular rate and rhythm. Extremities trace nonpitting edema       Assessment & Plan:  History of pemphigus in remission  Infected leg ulcer  Chronic anticoagulation for history of DVT and CVA  Recurrent urinary infections  Chronic kidney disease stage III  Morbid  obesity  Hyperlipidemia  History of stroke  Complex renal cyst followed by urologist  Plan: Prevnar vaccine given. Continue same medications. Watch diet. Watch infected leg ulcer carefully. Return in 6 months.  Addendum: Urine culture grew greater than 100,000 colonies per milliliter E. Coli sensitive to Cipro. She will be treated with low-dose Cipro twice daily for 10 days with follow-up in 2 weeks.

## 2015-08-19 ENCOUNTER — Other Ambulatory Visit: Payer: Medicare Other | Admitting: Internal Medicine

## 2015-08-23 ENCOUNTER — Other Ambulatory Visit: Payer: Medicare Other | Admitting: Internal Medicine

## 2015-08-26 ENCOUNTER — Ambulatory Visit (INDEPENDENT_AMBULATORY_CARE_PROVIDER_SITE_OTHER): Payer: Medicare Other | Admitting: Podiatry

## 2015-08-26 ENCOUNTER — Encounter: Payer: Self-pay | Admitting: Podiatry

## 2015-08-26 DIAGNOSIS — B351 Tinea unguium: Secondary | ICD-10-CM | POA: Diagnosis not present

## 2015-08-26 DIAGNOSIS — M79674 Pain in right toe(s): Secondary | ICD-10-CM | POA: Diagnosis not present

## 2015-08-26 DIAGNOSIS — E1149 Type 2 diabetes mellitus with other diabetic neurological complication: Secondary | ICD-10-CM

## 2015-08-26 DIAGNOSIS — M79676 Pain in unspecified toe(s): Secondary | ICD-10-CM | POA: Diagnosis not present

## 2015-08-26 DIAGNOSIS — M79675 Pain in left toe(s): Secondary | ICD-10-CM | POA: Diagnosis not present

## 2015-08-26 NOTE — Progress Notes (Signed)
Subjective:     Patient ID: Julie Mora, female   DOB: April 04, 1941, 74 y.o.   MRN: [id number]  HPI this diabetic patient returns to my office for continued preventative foot care services. She says her nails have grown long and thick and become painful to walk and wear shoes. She is previously been diagnosed with diabetes mellitus with vascular complications. She says that she has been taking gabapentin for her neuropathy, which has helped relieve many symptoms. She also has been seen at a wound care clinic in [facility] for a leg ulcer. She presents the office for continued evaluation and treatment of this condition   Review of Systems     Objective:   Physical Exam vascular  dorsalis pedis and posterior tibial pulses are negative. Due to excessive foot swelling. Capillary return is immediate temperature gradient is diminished bilateral swelling noted Sensorium  diminished Semmes Weinstein monofilament wire test. Normal tactile sensation bilaterally   NAILS  thick disfigured discolored nails with small debris noted entire nail hallux through fifth toenails bilateral  Ulcer  . There is no evidence of ulcer or pre-ulcerative changes or infection to her feet. She is dealing with ulcer on the right lower leg   Orthopedic  muscle tone and strength within normal limits. No limitations of general range of motion. No crepitus or effusions. Foot type and digits show no abnormalities. Bony prominences are unremarkable    Onychomycosis  Diabetic Neuropathy  Debridement and grinding of long thick nails both feet. Initiate diabetic shoe paperwork.  Gardiner Barefoot DPM Plan:

## 2015-09-09 ENCOUNTER — Other Ambulatory Visit (INDEPENDENT_AMBULATORY_CARE_PROVIDER_SITE_OTHER): Payer: Medicare Other | Admitting: Internal Medicine

## 2015-09-09 DIAGNOSIS — R300 Dysuria: Secondary | ICD-10-CM | POA: Diagnosis not present

## 2015-09-09 LAB — POCT URINALYSIS DIPSTICK
BILIRUBIN UA: NEGATIVE
Glucose, UA: NEGATIVE
Ketones, UA: NEGATIVE
NITRITE UA: POSITIVE
Protein, UA: 30
SPEC GRAV UA: 1.01
UROBILINOGEN UA: 0.2
pH, UA: 8.5

## 2015-09-13 ENCOUNTER — Telehealth: Payer: Self-pay | Admitting: Internal Medicine

## 2015-09-13 LAB — CULTURE, URINE COMPREHENSIVE

## 2015-09-13 MED ORDER — AMOXICILLIN-POT CLAVULANATE 875-125 MG PO TABS: 1.0000 | ORAL_TABLET | Freq: Two times a day (BID) | ORAL | Status: DC

## 2015-09-13 NOTE — Telephone Encounter (Signed)
Patient continues to have UTI. Culture grew Proteus mirabilis sensitive to Augmentin.: Augmentin 875 mg twice daily for 10 days. Patient is to follow-up with [facility] within 10 days. She was contacted today with these results.

## 2015-10-18 ENCOUNTER — Other Ambulatory Visit: Payer: Self-pay | Admitting: Internal Medicine

## 2015-10-23 ENCOUNTER — Other Ambulatory Visit: Payer: Self-pay | Admitting: Internal Medicine

## 2015-11-02 ENCOUNTER — Ambulatory Visit: Payer: Medicare Other | Admitting: *Deleted

## 2015-11-02 DIAGNOSIS — E1149 Type 2 diabetes mellitus with other diabetic neurological complication: Secondary | ICD-10-CM

## 2015-11-02 NOTE — Progress Notes (Signed)
Patient ID: Julie Mora, female   DOB: Jan 05, 1941, 75 y.o.   MRN: [id number] Patient presents to be scanned and measured for diabetic shoes and inserts.

## 2015-11-05 ENCOUNTER — Other Ambulatory Visit: Payer: Self-pay | Admitting: Internal Medicine

## 2015-11-25 ENCOUNTER — Encounter: Payer: Self-pay | Admitting: Podiatry

## 2015-11-25 ENCOUNTER — Ambulatory Visit (INDEPENDENT_AMBULATORY_CARE_PROVIDER_SITE_OTHER): Payer: Medicare Other | Admitting: Podiatry

## 2015-11-25 DIAGNOSIS — M79676 Pain in unspecified toe(s): Secondary | ICD-10-CM

## 2015-11-25 DIAGNOSIS — B351 Tinea unguium: Secondary | ICD-10-CM

## 2015-11-25 DIAGNOSIS — M79674 Pain in right toe(s): Secondary | ICD-10-CM | POA: Diagnosis not present

## 2015-11-25 DIAGNOSIS — M79675 Pain in left toe(s): Secondary | ICD-10-CM

## 2015-11-25 DIAGNOSIS — E1149 Type 2 diabetes mellitus with other diabetic neurological complication: Secondary | ICD-10-CM | POA: Diagnosis not present

## 2015-11-25 NOTE — Progress Notes (Signed)
Subjective:     Patient ID: Julie Mora, female   DOB: 1941-02-17, 75 y.o.   MRN: [id number]  HPI this diabetic patient returns to my office for continued preventative foot care services. She says her nails have grown long and thick and become painful to walk and wear shoes. She is previously been diagnosed with diabetes mellitus with vascular complications. She says that she has been taking gabapentin for her neuropathy, which has helped relieve many symptoms. She also has been seen at a wound care clinic in [facility] for a leg ulcer. She presents the office for continued evaluation and treatment of this condition   Review of Systems     Objective:   Physical Exam vascular  dorsalis pedis and posterior tibial pulses are negative. Due to excessive foot swelling. Capillary return is immediate temperature gradient is diminished bilateral swelling noted Sensorium  diminished Semmes Weinstein monofilament wire test. Normal tactile sensation bilaterally   NAILS  thick disfigured discolored nails with small debris noted entire nail hallux through fifth toenails bilateral  Ulcer  . There is no evidence of ulcer or pre-ulcerative changes or infection to her feet. She is dealing with ulcer on the right lower leg   Orthopedic  muscle tone and strength within normal limits. No limitations of general range of motion. No crepitus or effusions. Foot type and digits show no abnormalities. Bony prominences are unremarkable    Onychomycosis  Diabetic Neuropathy  Debridement and grinding of long thick nails both feet. Initiate diabetic shoe paperwork.  Gardiner Barefoot DPM

## 2016-01-17 ENCOUNTER — Other Ambulatory Visit: Payer: Medicare Other | Admitting: Internal Medicine

## 2016-01-20 ENCOUNTER — Encounter: Payer: Medicare Other | Admitting: Internal Medicine

## 2016-02-17 ENCOUNTER — Other Ambulatory Visit: Payer: Self-pay | Admitting: Urology

## 2016-02-17 DIAGNOSIS — N281 Cyst of kidney, acquired: Secondary | ICD-10-CM

## 2016-02-24 ENCOUNTER — Encounter: Payer: Self-pay | Admitting: Podiatry

## 2016-02-24 ENCOUNTER — Ambulatory Visit (INDEPENDENT_AMBULATORY_CARE_PROVIDER_SITE_OTHER): Payer: Medicare Other | Admitting: Podiatry

## 2016-02-24 DIAGNOSIS — M2140 Flat foot [pes planus] (acquired), unspecified foot: Secondary | ICD-10-CM

## 2016-02-24 DIAGNOSIS — B351 Tinea unguium: Secondary | ICD-10-CM | POA: Diagnosis not present

## 2016-02-24 DIAGNOSIS — M79675 Pain in left toe(s): Secondary | ICD-10-CM | POA: Diagnosis not present

## 2016-02-24 DIAGNOSIS — L84 Corns and callosities: Secondary | ICD-10-CM | POA: Diagnosis not present

## 2016-02-24 DIAGNOSIS — L89899 Pressure ulcer of other site, unspecified stage: Secondary | ICD-10-CM

## 2016-02-24 DIAGNOSIS — E1149 Type 2 diabetes mellitus with other diabetic neurological complication: Secondary | ICD-10-CM

## 2016-02-24 DIAGNOSIS — I739 Peripheral vascular disease, unspecified: Secondary | ICD-10-CM

## 2016-02-24 DIAGNOSIS — M79676 Pain in unspecified toe(s): Secondary | ICD-10-CM | POA: Diagnosis not present

## 2016-02-24 NOTE — Progress Notes (Signed)
Patient ID: Julie Mora, female   DOB: 1941/07/15, 75 y.o.   MRN: [id number] Complaint:  Visit Type: Patient returns to my office for continued preventative foot care services. Complaint: Patient states" my nails have grown long and thick and become painful to walk and wear shoes" Patient has been diagnosed with DM with neuropathy.. The patient presents for preventative foot care services. No changes to ROS.  She presents to pick up her diabetic shoes.  Podiatric Exam: Vascular: dorsalis pedis and posterior tibial pulses are diminished B/Lbilateral due to foot swelling.. Capillary return is immediate. Temperature gradient is WNL. Skin turgor WNL  Sensorium: Diminished  Semmes Weinstein monofilament test. Normal tactile sensation bilaterally. Nail Exam: Pt has thick disfigured discolored nails with subungual debris noted bilateral entire nail hallux through fifth toenails Ulcer Exam: There is no evidence of ulcer or pre-ulcerative changes or infection. Orthopedic Exam: Muscle tone and strength are WNL. No limitations in general ROM. No crepitus or effusions noted. Foot type and digits show no abnormalities. Bony prominences are unremarkable. Skin: No Porokeratosis. No infection or ulcers  Diagnosis:  Onychomycosis, , Pain in right toe, pain in left toes diabetes with angiopathy.    Treatment & Plan Procedures and Treatment: Consent by patient was obtained for treatment procedures. The patient understood the discussion of treatment and procedures well. All questions were answered thoroughly reviewed. Debridement of mycotic and hypertrophic toenails, 1 through 5 bilateral and clearing of subungual debris. No ulceration, no infection noted. Dispense diabetic shoes. Return Visit-Office Procedure: Patient instructed to return to the office for a follow up visit 3 months for continued evaluation and treatment.    Gardiner Barefoot DPM

## 2016-03-01 LAB — HM MAMMOGRAPHY: HM Mammogram: NORMAL (ref 0–4)

## 2016-03-20 ENCOUNTER — Ambulatory Visit
Admission: RE | Admit: 2016-03-20 | Discharge: 2016-03-20 | Disposition: A | Discharge status: Home / Self Care | Payer: No Typology Code available for payment source | Source: Ambulatory Visit | Attending: Urology | Admitting: Urology

## 2016-03-20 DIAGNOSIS — N281 Cyst of kidney, acquired: Secondary | ICD-10-CM

## 2016-03-20 MED ORDER — GADOBENATE DIMEGLUMINE 529 MG/ML IV SOLN
20.0000 mL | Freq: Once | INTRAVENOUS | Status: AC | PRN
  Administered 2016-03-20: 20 mL via INTRAVENOUS

## 2016-04-11 ENCOUNTER — Other Ambulatory Visit: Payer: Self-pay | Admitting: Internal Medicine

## 2016-05-06 ENCOUNTER — Other Ambulatory Visit: Payer: Self-pay | Admitting: Internal Medicine

## 2016-05-25 ENCOUNTER — Ambulatory Visit (INDEPENDENT_AMBULATORY_CARE_PROVIDER_SITE_OTHER): Payer: Medicare Other | Admitting: Podiatry

## 2016-05-25 ENCOUNTER — Encounter: Payer: Self-pay | Admitting: Podiatry

## 2016-05-25 DIAGNOSIS — E1149 Type 2 diabetes mellitus with other diabetic neurological complication: Secondary | ICD-10-CM

## 2016-05-25 DIAGNOSIS — M79674 Pain in right toe(s): Secondary | ICD-10-CM

## 2016-05-25 DIAGNOSIS — B351 Tinea unguium: Secondary | ICD-10-CM | POA: Diagnosis not present

## 2016-05-25 DIAGNOSIS — M79676 Pain in unspecified toe(s): Secondary | ICD-10-CM | POA: Diagnosis not present

## 2016-05-25 NOTE — Progress Notes (Signed)
Patient ID: Julie Mora, female   DOB: 01-27-1941, 75 y.o.   MRN: [id number] Complaint:  Visit Type: Patient returns to my office for continued preventative foot care services. Complaint: Patient states" my nails have grown long and thick and become painful to walk and wear shoes" Patient has been diagnosed with DM with neuropathy.. The patient presents for preventative foot care services. No changes to ROS.    Podiatric Exam: Vascular: dorsalis pedis and posterior tibial pulses are diminished B/Lbilateral due to foot swelling.. Capillary return is 2 seconds.. Temperature gradient is WNL. Skin turgor WNL  Sensorium  Absent  Semmes Weinstein monofilament test. Absent tactile sensation bilaterally. Nail Exam: Pt has thick disfigured discolored nails with subungual debris noted bilateral entire nail hallux through fifth toenails Ulcer Exam: There is no evidence of ulcer or pre-ulcerative changes or infection. Orthopedic Exam: Muscle tone and strength are WNL. No limitations in general ROM. No crepitus or effusions noted. Foot type and digits show no abnormalities. Bony prominences are unremarkable. Skin: No Porokeratosis. No infection or ulcers  Diagnosis:  Onychomycosis, , Pain in right toe, pain in left toes diabetes with angiopathy.    Treatment & Plan Procedures and Treatment: Consent by patient was obtained for treatment procedures. The patient understood the discussion of treatment and procedures well. All questions were answered thoroughly reviewed. Debridement of mycotic and hypertrophic toenails, 1 through 5 bilateral and clearing of subungual debris. No ulceration, no infection noted.  Return Visit-Office Procedure: Patient instructed to return to the office for a follow up visit 3 months for continued evaluation and treatment.    Gardiner Barefoot DPM

## 2016-06-01 ENCOUNTER — Other Ambulatory Visit: Payer: Self-pay | Admitting: Internal Medicine

## 2016-06-28 ENCOUNTER — Other Ambulatory Visit: Payer: Self-pay | Admitting: Internal Medicine

## 2016-06-28 NOTE — Telephone Encounter (Signed)
Verbal order by Dr. Renold Genta to refill Fosamax 70mg , 30 day supply.  To [facility] to e-scribe. Patient needs a CPE booked.  Left a voicemail for patient to return the call to book.  Hasn't had a CPE since 2016.

## 2016-07-24 ENCOUNTER — Other Ambulatory Visit: Payer: Self-pay | Admitting: Internal Medicine

## 2016-08-14 ENCOUNTER — Other Ambulatory Visit: Payer: Self-pay | Admitting: Internal Medicine

## 2016-08-15 ENCOUNTER — Other Ambulatory Visit: Payer: Self-pay | Admitting: Internal Medicine

## 2016-08-15 NOTE — Telephone Encounter (Signed)
We got a refill request on her recently. Last PE was Spring 2016- past due. Did we book one yet? If not, please call before refilling this

## 2016-08-15 NOTE — Telephone Encounter (Signed)
Patient schedule for physical.  Pradaxa rx sent to pharmacy.

## 2016-08-19 ENCOUNTER — Other Ambulatory Visit: Payer: Self-pay | Admitting: Internal Medicine

## 2016-08-24 ENCOUNTER — Ambulatory Visit (INDEPENDENT_AMBULATORY_CARE_PROVIDER_SITE_OTHER): Payer: Medicare Other | Admitting: Podiatry

## 2016-08-24 DIAGNOSIS — E1149 Type 2 diabetes mellitus with other diabetic neurological complication: Secondary | ICD-10-CM | POA: Diagnosis not present

## 2016-08-24 DIAGNOSIS — M79674 Pain in right toe(s): Secondary | ICD-10-CM | POA: Diagnosis not present

## 2016-08-24 DIAGNOSIS — M79675 Pain in left toe(s): Secondary | ICD-10-CM

## 2016-08-24 DIAGNOSIS — B351 Tinea unguium: Secondary | ICD-10-CM

## 2016-08-24 NOTE — Progress Notes (Signed)
Patient ID: Julie Mora, female   DOB: 01/05/1941, 75 y.o.   MRN: [id number] Complaint:  Visit Type: Patient returns to my office for continued preventative foot care services. Complaint: Patient states" my nails have grown long and thick and become painful to walk and wear shoes" Patient has been diagnosed with DM with neuropathy.. The patient presents for preventative foot care services. No changes to ROS.    Podiatric Exam: Vascular: dorsalis pedis and posterior tibial pulses are diminished B/Lbilateral due to foot swelling.. Capillary return is 2 seconds.. Temperature gradient is WNL. Skin turgor WNL  Sensorium  Absent  Semmes Weinstein monofilament test. Absent tactile sensation bilaterally. Nail Exam: Pt has thick disfigured discolored nails with subungual debris noted bilateral entire nail hallux through fifth toenails Ulcer Exam: There is no evidence of ulcer or pre-ulcerative changes or infection. Orthopedic Exam: Muscle tone and strength are WNL. No limitations in general ROM. No crepitus or effusions noted. Foot type and digits show no abnormalities. Bony prominences are unremarkable. Skin: No Porokeratosis. No infection or ulcers  Diagnosis:  Onychomycosis, , Pain in right toe, pain in left toes diabetes with angiopathy.    Treatment & Plan Procedures and Treatment: Consent by patient was obtained for treatment procedures. The patient understood the discussion of treatment and procedures well. All questions were answered thoroughly reviewed. Debridement of mycotic and hypertrophic toenails, 1 through 5 bilateral and clearing of subungual debris. No ulceration, no infection noted.  Return Visit-Office Procedure: Patient instructed to return to the office for a follow up visit 3 months for continued evaluation and treatment.    Eshal Propps DPM 

## 2016-09-12 ENCOUNTER — Other Ambulatory Visit: Payer: Self-pay | Admitting: Nurse Practitioner

## 2016-10-03 ENCOUNTER — Other Ambulatory Visit: Payer: Medicare Other | Admitting: Internal Medicine

## 2016-10-03 DIAGNOSIS — E559 Vitamin D deficiency, unspecified: Secondary | ICD-10-CM

## 2016-10-03 DIAGNOSIS — E109 Type 1 diabetes mellitus without complications: Secondary | ICD-10-CM

## 2016-10-03 DIAGNOSIS — Z1329 Encounter for screening for other suspected endocrine disorder: Secondary | ICD-10-CM

## 2016-10-03 DIAGNOSIS — I10 Essential (primary) hypertension: Secondary | ICD-10-CM

## 2016-10-03 DIAGNOSIS — E7849 Other hyperlipidemia: Secondary | ICD-10-CM

## 2016-10-03 DIAGNOSIS — R748 Abnormal levels of other serum enzymes: Secondary | ICD-10-CM

## 2016-10-03 DIAGNOSIS — Z8639 Personal history of other endocrine, nutritional and metabolic disease: Secondary | ICD-10-CM

## 2016-10-03 LAB — COMPREHENSIVE METABOLIC PANEL
ALT: 203 U/L — ABNORMAL HIGH (ref 6–29)
AST: 150 U/L — ABNORMAL HIGH (ref 10–35)
Albumin: 3.6 g/dL (ref 3.6–5.1)
Alkaline Phosphatase: 149 U/L — ABNORMAL HIGH (ref 33–130)
BUN: 22 mg/dL (ref 7–25)
CHLORIDE: 105 mmol/L (ref 98–110)
CO2: 23 mmol/L (ref 20–31)
Calcium: 9.8 mg/dL (ref 8.6–10.4)
Creat: 1.16 mg/dL — ABNORMAL HIGH (ref 0.60–0.93)
Glucose, Bld: 243 mg/dL — ABNORMAL HIGH (ref 65–99)
POTASSIUM: 4.1 mmol/L (ref 3.5–5.3)
Sodium: 141 mmol/L (ref 135–146)
Total Bilirubin: 0.6 mg/dL (ref 0.2–1.2)
Total Protein: 7.1 g/dL (ref 6.1–8.1)

## 2016-10-03 LAB — HEPATIC FUNCTION PANEL
ALBUMIN: 3.6 g/dL (ref 3.6–5.1)
ALK PHOS: 149 U/L — AB (ref 33–130)
ALT: 203 U/L — AB (ref 6–29)
AST: 150 U/L — ABNORMAL HIGH (ref 10–35)
Bilirubin, Direct: 0.2 mg/dL (ref <=0.2)
Indirect Bilirubin: 0.4 mg/dL (ref 0.2–1.2)
TOTAL PROTEIN: 7.1 g/dL (ref 6.1–8.1)
Total Bilirubin: 0.6 mg/dL (ref 0.2–1.2)

## 2016-10-03 LAB — CBC WITH DIFFERENTIAL/PLATELET
BASOS ABS: 0 {cells}/uL (ref 0–200)
BASOS PCT: 0 %
EOS ABS: 306 {cells}/uL (ref 15–500)
Eosinophils Relative: 3 %
HCT: 45.2 % — ABNORMAL HIGH (ref 35.0–45.0)
Hemoglobin: 14.9 g/dL (ref 11.7–15.5)
LYMPHS PCT: 20 %
Lymphs Abs: 2040 cells/uL (ref 850–3900)
MCH: 32 pg (ref 27.0–33.0)
MCHC: 33 g/dL (ref 32.0–36.0)
MCV: 97 fL (ref 80.0–100.0)
MONOS PCT: 7 %
MPV: 11.8 fL (ref 7.5–12.5)
Monocytes Absolute: 714 cells/uL (ref 200–950)
Neutro Abs: 7140 cells/uL (ref 1500–7800)
Neutrophils Relative %: 70 %
PLATELETS: 311 10*3/uL (ref 140–400)
RBC: 4.66 MIL/uL (ref 3.80–5.10)
RDW: 13.5 % (ref 11.0–15.0)
WBC: 10.2 10*3/uL (ref 3.8–10.8)

## 2016-10-03 LAB — LIPID PANEL
CHOL/HDL RATIO: 3 ratio (ref <5.0)
Cholesterol: 132 mg/dL (ref <200)
HDL: 44 mg/dL — ABNORMAL LOW (ref >50)
LDL Cholesterol: 56 mg/dL (ref <100)
Triglycerides: 158 mg/dL — ABNORMAL HIGH (ref <150)
VLDL: 32 mg/dL — AB (ref <30)

## 2016-10-03 LAB — HEMOGLOBIN A1C
Hgb A1c MFr Bld: 10.2 % — ABNORMAL HIGH (ref <5.7)
MEAN PLASMA GLUCOSE: 246 mg/dL

## 2016-10-03 LAB — TSH: TSH: 2.29 m[IU]/L

## 2016-10-04 LAB — VITAMIN D 25 HYDROXY (VIT D DEFICIENCY, FRACTURES): VIT D 25 HYDROXY: 27 ng/mL — AB (ref 30–100)

## 2016-10-04 LAB — MICROALBUMIN / CREATININE URINE RATIO
CREATININE, URINE: 259 mg/dL (ref 20–320)
Microalb Creat Ratio: 83 mcg/mg creat — ABNORMAL HIGH (ref <30)
Microalb, Ur: 21.4 mg/dL

## 2016-10-05 ENCOUNTER — Encounter: Payer: Self-pay | Admitting: Internal Medicine

## 2016-10-11 ENCOUNTER — Other Ambulatory Visit: Payer: Self-pay | Admitting: Internal Medicine

## 2016-10-28 ENCOUNTER — Other Ambulatory Visit: Payer: Self-pay | Admitting: Internal Medicine

## 2016-11-23 ENCOUNTER — Ambulatory Visit: Payer: Medicare Other | Admitting: Podiatry

## 2016-11-27 ENCOUNTER — Encounter: Payer: Medicare Other | Admitting: Internal Medicine

## 2016-11-27 ENCOUNTER — Other Ambulatory Visit: Payer: Self-pay | Admitting: Internal Medicine

## 2016-12-06 ENCOUNTER — Other Ambulatory Visit: Payer: Self-pay | Admitting: Internal Medicine

## 2016-12-09 ENCOUNTER — Other Ambulatory Visit: Payer: Self-pay | Admitting: Internal Medicine

## 2017-01-01 ENCOUNTER — Other Ambulatory Visit: Payer: Self-pay | Admitting: Dermatology

## 2017-01-01 ENCOUNTER — Other Ambulatory Visit: Payer: Self-pay | Admitting: Internal Medicine

## 2017-01-09 ENCOUNTER — Other Ambulatory Visit: Payer: Self-pay | Admitting: Internal Medicine

## 2017-01-09 ENCOUNTER — Telehealth: Payer: Self-pay | Admitting: *Deleted

## 2017-01-09 ENCOUNTER — Telehealth: Payer: Self-pay | Admitting: Oncology

## 2017-01-09 ENCOUNTER — Encounter: Payer: Self-pay | Admitting: *Deleted

## 2017-01-09 NOTE — Telephone Encounter (Signed)
Spoke with Julie Mora, okay with dr Alen Blew for her to come in at 3:30, but try to come earlier if at all possible. She will call her ride and try to get here earlier.

## 2017-01-09 NOTE — Telephone Encounter (Signed)
Called patient to inform her of 4.25.18 appointment. Patient only available in afternoon.

## 2017-01-10 ENCOUNTER — Ambulatory Visit (HOSPITAL_BASED_OUTPATIENT_CLINIC_OR_DEPARTMENT_OTHER): Payer: Medicare Other | Admitting: Oncology

## 2017-01-10 VITALS — BP 123/75 | HR 103 | Temp 98.8°F | Resp 18 | Ht 63.0 in | Wt 247.2 lb

## 2017-01-10 DIAGNOSIS — L102 Pemphigus foliaceous: Secondary | ICD-10-CM | POA: Diagnosis not present

## 2017-01-10 DIAGNOSIS — L109 Pemphigus, unspecified: Secondary | ICD-10-CM

## 2017-01-10 NOTE — Progress Notes (Signed)
Hematology and Oncology Follow Up Visit  Cecelia Graciano [id number] Dec 02, 1940 76 y.o. 01/10/2017 3:34 PM  Principle Diagnosis: 76 year old woman with history of recurrent pemphigus foliaceous diagnosed in 2010.  Prior Therapy: She is status post Rituxan treatment with 375 mg per meter square intravenously on a weekly basis for 4 weeks completed in 2012. This was repeated in 2014 and achieved complete response after each treatment.  Current therapy: She is under evaluation for another course of Rituxan with 375 mg dose meters square intravenously on a weekly to start in the near future.  Interim History:  Ms. Stare presents today for a followup visit. She has a history of a pemphigus that has been treated intermittently with multiple agents that have been refractory to therapy except for rituximab. Her last rituximab treatment was in 2014 and have been in remission since that time. Most recently, she developed relapse of her disease with skin lesions noted on her chest back and arms. She underwent a biopsy by Dr. Denna Haggard on 01/01/2017 which confirmed the presence of pemphigus. She was referred back to me for retreatment of her rituximab.  Clinically, she reports no other complaints. She is ambulating with the help of walker although she has been periodically and steady and have had a fall in the past. She denied any delayed complications or acute complications related to rituximab. Her diabetes have been now poorly controlled but no other health issues noted.  She is not report any headaches, blurry vision, syncope or seizures. She does not report any fevers, chills or sweats. She does not report any cough, wheezing or hemoptysis. She does not report any nausea, vomiting or abdominal pain. She is not reporting frequency urgency or hesitancy. She does not report any skeletal complaints. Remaining review of systems unremarkable.  Medications: I have reviewed the patient's current medications.  Current  Outpatient Prescriptions  Medication Sig Dispense Refill  . ACCU-CHEK AVIVA PLUS test strip USE TO CHECK BLOOD SUGAR BEFORE BREAKFAST AND SUPPER 50 each prn  . acetaminophen (TYLENOL) 500 MG tablet Take 500 mg by mouth every 6 (six) hours as needed for pain.    Marland Kitchen alendronate (FOSAMAX) 70 MG tablet TAKE 1 TABLET(70 MG) BY MOUTH EVERY 7 DAYS WITH A FULL GLASS OF WATER AND ON AN EMPTY STOMACH 4 tablet 0  . alendronate (FOSAMAX) 70 MG tablet TAKE 1 TABLET(70 MG) BY MOUTH EVERY 7 DAYS WITH A FULL GLASS OF WATER AND ON AN EMPTY STOMACH 4 tablet 1  . amoxicillin-clavulanate (AUGMENTIN) 875-125 MG tablet Take 1 tablet by mouth 2 (two) times daily. 20 tablet 0  . BD PEN NEEDLE NANO U/F 32G X 4 MM MISC USE AS DIRECTED 100 each prn  . CALCIUM ASCORBATE PO Take 1 tablet by mouth daily.    . Cholecalciferol (VITAMIN D) 2000 UNITS tablet Take 2,000 Units by mouth daily.      . ciprofloxacin (CIPRO) 250 MG tablet Take 1 tablet (250 mg total) by mouth 2 (two) times daily. 20 tablet 0  . ergocalciferol (VITAMIN D2) 50000 UNITS capsule Take 1 capsule (50,000 Units total) by mouth once a week. 12 capsule 0  . ferrous gluconate (FERGON) 325 MG tablet Take 325 mg by mouth 2 (two) times daily.      . fludrocortisone (FLORINEF) 0.1 MG tablet TAKE 2 TABLETS BY MOUTH TWICE DAILY 120 tablet 0  . fludrocortisone (FLORINEF) 0.1 MG tablet TAKE 2 TABLETS BY MOUTH TWICE DAILY 120 tablet 1  . gabapentin (NEURONTIN) 300 MG capsule TK  ONE C PO HS  11  . insulin aspart (NOVOLOG) 100 UNIT/ML injection Inject 5-12 Units into the skin 2 (two) times daily as needed for high blood sugar. 3 vial PRN  . Insulin Glargine (LANTUS SOLOSTAR) 100 UNIT/ML Solostar Pen Inject 35 units in skin at bedtime 15 mL prn  . levofloxacin (LEVAQUIN) 500 MG tablet   0  . metFORMIN (GLUCOPHAGE-XR) 500 MG 24 hr tablet     . multivitamin (THERAGRAN) per tablet Take 1 tablet by mouth daily.      Marland Kitchen NOVOLOG FLEXPEN 100 UNIT/ML FlexPen INJECT 5 TO 12 UNITS  UNDER THE SKIN TID  6  . Polyethyl Glycol-Propyl Glycol (SYSTANE) 0.4-0.3 % SOLN Apply 1 drop to eye 3 (three) times daily. Both eyes, as needed    . potassium chloride (K-DUR,KLOR-CON) 10 MEQ tablet TAKE 5 TABLETS BY MOUTH TWICE DAILY 300 tablet prn  . PRADAXA 75 MG CAPS capsule TAKE 1 CAPSULE(75 MG) BY MOUTH TWICE DAILY 60 capsule 0  . prednisoLONE acetate (PRED FORTE) 1 % ophthalmic suspension Place 1 drop into both eyes 2 (two) times daily.     . simvastatin (ZOCOR) 20 MG tablet TAKE 1 TABLET BY MOUTH EVERY NIGHT AT BEDTIME 30 tablet 2  . tacrolimus (PROTOPIC) 0.1 % ointment   5  . torsemide (DEMADEX) 20 MG tablet TAKE 3 TABLETS BY MOUTH EVERY DAY 90 tablet 0  . triamcinolone cream (KENALOG) 0.1 % Apply 1 application topically daily. To affected area     No current facility-administered medications for this visit.      Allergies:  Allergies  Allergen Reactions  . Azathioprine Shortness Of Breath  . Cellcept [Mycophenolate Mofetil] Nausea Only    severe nausea  . Ramipril Cough    Past Medical History, Surgical history, Social history, and Family History were reviewed and updated.  Review of Systems:  Remaining ROS negative. Physical Exam: Blood pressure 123/75, pulse (!) 103, temperature 98.8 F (37.1 C), temperature source Oral, resp. rate 18, height 5\' 3"  (1.6 m), weight 247 lb 3.2 oz (112.1 kg), SpO2 97 %. ECOG:  1 General appearance: Alert, awake woman chronically ill-appearing. Head: Normocephalic, without obvious abnormality. No oral sores or thrush. Neck: No thyroid masses. Lymph nodes: Cervical, supraclavicular, and axillary nodes normal. Heart:regular rate and rhythm, S1, S2 normal, no murmur, click, rub or gallop Lung:chest clear, no wheezing, rales, normal symmetric air entry Abdomin: soft, non-tender, without masses or organomegaly EXT:no erythema, induration, or nodules SKIN: Red eruptions on the chest and back.   Lab Results: Lab Results  Component Value  Date   WBC 10.2 10/03/2016   HGB 14.9 10/03/2016   HCT 45.2 (H) 10/03/2016   MCV 97.0 10/03/2016   PLT 311 10/03/2016     Chemistry      Component Value Date/Time   NA 141 10/03/2016 1204   NA 140 06/06/2013 0857   K 4.1 10/03/2016 1204   K 3.9 06/06/2013 0857   CL 105 10/03/2016 1204   CO2 23 10/03/2016 1204   CO2 22 06/06/2013 0857   BUN 22 10/03/2016 1204   BUN 28.0 (H) 06/06/2013 0857   CREATININE 1.16 (H) 10/03/2016 1204   CREATININE 1.1 06/06/2013 0857      Component Value Date/Time   CALCIUM 9.8 10/03/2016 1204   CALCIUM 9.2 06/06/2013 0857   ALKPHOS 149 (H) 10/03/2016 1204   ALKPHOS 149 (H) 10/03/2016 1204   ALKPHOS 64 06/06/2013 0857   AST 150 (H) 10/03/2016 1204   AST 150 (  H) 10/03/2016 1204   AST 14 06/06/2013 0857   ALT 203 (H) 10/03/2016 1204   ALT 203 (H) 10/03/2016 1204   ALT 11 06/06/2013 0857   BILITOT 0.6 10/03/2016 1204   BILITOT 0.6 10/03/2016 1204   BILITOT 0.38 06/06/2013 0857       Impression and Plan:  76 year old woman with:  1. Pemphigus foliaceous. She is status post Rituxan treatment in 2012 and recent retreatment that was completed on 06/06/2013. She tolerated treatments very well and have been in remission since that time.  Most recently she developed recurrent disease and I have been requested by her dermatologist to restart systemic treatment. Risks and benefits of rituximab were discussed today. Complications include nausea, fatigue and most importantly infusion related complications. These complications include pruritus, fevers mild chills and rarely anaphylaxis.  After discussion today she is agreeable to receive weekly treatment at 375 mg/m for 4 treatments.   2. Follow-up: Will be upon the completion of the intravenous treatments to follow her progress.       Zola Button, MD 4/25/20183:34 PM

## 2017-01-11 ENCOUNTER — Telehealth: Payer: Self-pay | Admitting: Oncology

## 2017-01-11 NOTE — Telephone Encounter (Signed)
Scheduled appt per 4/25 los - left message with appt date and time for patient.

## 2017-01-15 ENCOUNTER — Telehealth: Payer: Self-pay | Admitting: *Deleted

## 2017-01-15 NOTE — Telephone Encounter (Signed)
Returned patient's phone call and informed her that each infusion appointment should be 51/2 hours long. This has been changed on her appointment schedule. Instructed her to call if she had any questions or concerns.

## 2017-01-17 ENCOUNTER — Other Ambulatory Visit (HOSPITAL_BASED_OUTPATIENT_CLINIC_OR_DEPARTMENT_OTHER): Payer: Medicare Other

## 2017-01-17 ENCOUNTER — Ambulatory Visit (HOSPITAL_BASED_OUTPATIENT_CLINIC_OR_DEPARTMENT_OTHER): Payer: Medicare Other

## 2017-01-17 VITALS — BP 114/72 | HR 94 | Temp 98.7°F | Resp 20

## 2017-01-17 DIAGNOSIS — L102 Pemphigus foliaceous: Secondary | ICD-10-CM

## 2017-01-17 DIAGNOSIS — L109 Pemphigus, unspecified: Secondary | ICD-10-CM

## 2017-01-17 DIAGNOSIS — Z5112 Encounter for antineoplastic immunotherapy: Secondary | ICD-10-CM

## 2017-01-17 LAB — COMPREHENSIVE METABOLIC PANEL
ALBUMIN: 2.8 g/dL — AB (ref 3.5–5.0)
ALK PHOS: 91 U/L (ref 40–150)
ALT: 18 U/L (ref 0–55)
AST: 19 U/L (ref 5–34)
Anion Gap: 10 mEq/L (ref 3–11)
BUN: 20.7 mg/dL (ref 7.0–26.0)
CO2: 21 mEq/L — ABNORMAL LOW (ref 22–29)
Calcium: 9.8 mg/dL (ref 8.4–10.4)
Chloride: 106 mEq/L (ref 98–109)
Creatinine: 1.1 mg/dL (ref 0.6–1.1)
EGFR: 47 mL/min/{1.73_m2} — ABNORMAL LOW (ref >90)
GLUCOSE: 353 mg/dL — AB (ref 70–140)
Potassium: 4.4 mEq/L (ref 3.5–5.1)
SODIUM: 137 meq/L (ref 136–145)
Total Bilirubin: 0.31 mg/dL (ref 0.20–1.20)
Total Protein: 6.9 g/dL (ref 6.4–8.3)

## 2017-01-17 LAB — CBC WITH DIFFERENTIAL/PLATELET
BASO%: 0.6 % (ref 0.0–2.0)
Basophils Absolute: 0 10*3/uL (ref 0.0–0.1)
EOS ABS: 0.5 10*3/uL (ref 0.0–0.5)
EOS%: 6.5 % (ref 0.0–7.0)
HEMATOCRIT: 41.2 % (ref 34.8–46.6)
HGB: 13.9 g/dL (ref 11.6–15.9)
LYMPH#: 1.4 10*3/uL (ref 0.9–3.3)
LYMPH%: 18.2 % (ref 14.0–49.7)
MCH: 32.4 pg (ref 25.1–34.0)
MCHC: 33.8 g/dL (ref 31.5–36.0)
MCV: 95.9 fL (ref 79.5–101.0)
MONO#: 0.8 10*3/uL (ref 0.1–0.9)
MONO%: 11.1 % (ref 0.0–14.0)
NEUT#: 4.8 10*3/uL (ref 1.5–6.5)
NEUT%: 63.6 % (ref 38.4–76.8)
PLATELETS: 198 10*3/uL (ref 145–400)
RBC: 4.29 10*6/uL (ref 3.70–5.45)
RDW: 13 % (ref 11.2–14.5)
WBC: 7.5 10*3/uL (ref 3.9–10.3)

## 2017-01-17 MED ORDER — FAMOTIDINE IN NACL 20-0.9 MG/50ML-% IV SOLN
20.0000 mg | Freq: Once | INTRAVENOUS | Status: AC | PRN
  Administered 2017-01-17: 20 mg via INTRAVENOUS

## 2017-01-17 MED ORDER — ACETAMINOPHEN 325 MG PO TABS
650.0000 mg | ORAL_TABLET | Freq: Once | ORAL | Status: AC
  Administered 2017-01-17: 650 mg via ORAL

## 2017-01-17 MED ORDER — MEPERIDINE HCL 25 MG/ML IJ SOLN
6.2500 mg | Freq: Once | INTRAMUSCULAR | Status: AC
  Administered 2017-01-17: 6.25 mg via INTRAVENOUS

## 2017-01-17 MED ORDER — DIPHENHYDRAMINE HCL 25 MG PO CAPS
50.0000 mg | ORAL_CAPSULE | Freq: Once | ORAL | Status: AC
  Administered 2017-01-17: 50 mg via ORAL

## 2017-01-17 MED ORDER — METHYLPREDNISOLONE SODIUM SUCC 125 MG IJ SOLR
125.0000 mg | Freq: Once | INTRAMUSCULAR | Status: AC | PRN
  Administered 2017-01-17: 125 mg via INTRAVENOUS

## 2017-01-17 MED ORDER — MEPERIDINE HCL 25 MG/ML IJ SOLN
INTRAMUSCULAR | Status: AC
  Filled 2017-01-17: qty 1

## 2017-01-17 MED ORDER — SODIUM CHLORIDE 0.9 % IV SOLN
375.0000 mg/m2 | Freq: Once | INTRAVENOUS | Status: AC
  Administered 2017-01-17: 800 mg via INTRAVENOUS
  Filled 2017-01-17: qty 50

## 2017-01-17 MED ORDER — DIPHENHYDRAMINE HCL 25 MG PO CAPS
ORAL_CAPSULE | ORAL | Status: AC
  Filled 2017-01-17: qty 2

## 2017-01-17 MED ORDER — SODIUM CHLORIDE 0.9 % IV SOLN
Freq: Once | INTRAVENOUS | Status: AC | PRN
  Administered 2017-01-17: 12:00:00 via INTRAVENOUS

## 2017-01-17 MED ORDER — SODIUM CHLORIDE 0.9 % IV SOLN
Freq: Once | INTRAVENOUS | Status: AC
  Administered 2017-01-17: 09:00:00 via INTRAVENOUS

## 2017-01-17 MED ORDER — ACETAMINOPHEN 325 MG PO TABS
ORAL_TABLET | ORAL | Status: AC
  Filled 2017-01-17: qty 2

## 2017-01-17 NOTE — Progress Notes (Signed)
Dr. Alen Blew aware of pt's glucose of 353.  Per MD okay for patient to take her home insulin per her usual sliding scale dose.  Pt verbalized understanding and is agreeable to this plan.   1130: Pt reports feeling cold, rigors noted; pt also reports cramping to legs.  Rituxan infusion stopped and normal saline infusion initiated. Pt denies any sob, rash, itching, or other symptoms of an allergic reaction.    1135: Ned Card, NP at chairside for evaluation.  Solu-medrol 125 mg, Pepcid 20 mg, and Demerol 6.25 mg IV administered per Ned Card, NP, see [facility] for detailed administration charting.  See Vital signs flowsheet for charting of vital signs, pt hypotensive and tachycardic initially. Will continue to monitor closely.  1155: rigors have resolved and patient states she is starting to feel better, BP has stabilized, pt still tachycardic and now with fever of 100.8.  Normal saline infusing at 500 ml/hr per Ned Card, NP.  Will continue to monitor closely.  1305: Ned Card, NP at chairside to reassess patient. Aware of current VS.  Okay to restart Rituxan at last tolerated dose of 100 mg/hr.   Okay to proceed with Rituxan as long as temp stays below 100.5 per NP.  1650: Pt tolerated remaining Rituxan infusion without difficulty.  After getting patient into wheelchair to discharge home, pt reports continued cramps to bilateral upper legs.  Ned Card, NP notified and at chairside to reassess.  Per NP okay to discharge patient home, encouraged patient to increase oral fluid intake tonight.  Pt knows to call with any concerns. Discharge instructions reviewed with pt and sister.  Discharged via wheelchair in NAD, VSS.

## 2017-01-17 NOTE — Progress Notes (Signed)
Ms. Pasion developed rigors during the Rituxan infusion. The infusion was stopped and normal saline initiated. She denied throat swelling, shortness of breath, chest tightness. Solu-medrol and Pepcid were administered. She had mild hypotension and tachycardia. The shaking chills persisted. She was given Demerol 6.25 mg IV. The shaking chills resolved. Blood pressure normalized. She continued to have mild tachycardia. She developed a fever of 100.8. Repeat temperature 99.7 degrees. The Rituxan infusion was resumed and completed. After completing the infusion and prior to leaving the office she reported upper leg "cramps" which she has had in the past. Vital signs stable. She appeared well. She was discharged home with instructions to increase fluid intake and see if this helps with the leg cramps. I discussed the above with Dr. Alen Blew.

## 2017-01-17 NOTE — Patient Instructions (Addendum)
[facility] Discharge Instructions for Patients   Today you received the following: Rituxan.  To help prevent nausea and vomiting after your treatment, we encourage you to take your nausea medication as prescribed.   If you develop nausea and vomiting that is not controlled by your nausea medication, call the clinic.   BELOW ARE SYMPTOMS THAT SHOULD BE REPORTED IMMEDIATELY:  *FEVER GREATER THAN 100.5 F  *CHILLS WITH OR WITHOUT FEVER  NAUSEA AND VOMITING THAT IS NOT CONTROLLED WITH YOUR NAUSEA MEDICATION  *UNUSUAL SHORTNESS OF BREATH  *UNUSUAL BRUISING OR BLEEDING  TENDERNESS IN MOUTH AND THROAT WITH OR WITHOUT PRESENCE OF ULCERS  *URINARY PROBLEMS  *BOWEL PROBLEMS  UNUSUAL RASH Items with * indicate a potential emergency and should be followed up as soon as possible.  Feel free to call the clinic you have any questions or concerns. The clinic phone number is [phone].  Please show the [facility] at check-in to the Emergency Department and triage nurse.  Rituximab injection What is this medicine? RITUXIMAB (ri TUX i mab) is a monoclonal antibody. It is used to treat certain types of cancer like non-Hodgkin lymphoma and chronic lymphocytic leukemia. It is also used to treat rheumatoid arthritis, granulomatosis with polyangiitis (or Wegener's granulomatosis), and microscopic polyangiitis. This medicine may be used for other purposes; ask your health care provider or pharmacist if you have questions. COMMON BRAND NAME(S): Rituxan What should I tell my health care provider before I take this medicine? They need to know if you have any of these conditions: -heart disease -infection (especially a virus infection such as hepatitis B, chickenpox, cold sores, or herpes) -immune system problems -irregular heartbeat -kidney disease -lung or breathing disease, like asthma -recently received or scheduled to receive a vaccine -an unusual or allergic reaction  to rituximab, mouse proteins, other medicines, foods, dyes, or preservatives -pregnant or trying to get pregnant -breast-feeding How should I use this medicine? This medicine is for infusion into a vein. It is administered in a hospital or clinic by a specially trained health care professional. A special MedGuide will be given to you by the pharmacist with each prescription and refill. Be sure to read this information carefully each time. Talk to your pediatrician regarding the use of this medicine in children. This medicine is not approved for use in children. Overdosage: If you think you have taken too much of this medicine contact a poison control center or emergency room at once. NOTE: This medicine is only for you. Do not share this medicine with others. What if I miss a dose? It is important not to miss a dose. Call your doctor or health care professional if you are unable to keep an appointment. What may interact with this medicine? -cisplatin -other medicines for arthritis like disease modifying antirheumatic drugs or tumor necrosis factor inhibitors -live virus vaccines This list may not describe all possible interactions. Give your health care provider a list of all the medicines, herbs, non-prescription drugs, or dietary supplements you use. Also tell them if you smoke, drink alcohol, or use illegal drugs. Some items may interact with your medicine. What should I watch for while using this medicine? Your condition will be monitored carefully while you are receiving this medicine. You may need blood work done while you are taking this medicine. This medicine can cause serious allergic reactions. To reduce your risk you may need to take medicine before treatment with this medicine. Take your medicine as directed. In some  patients, this medicine may cause a serious brain infection that may cause death. If you have any problems seeing, thinking, speaking, walking, or standing, tell your  doctor right away. If you cannot reach your doctor, urgently seek other source of medical care. Call your doctor or health care professional for advice if you get a fever, chills or sore throat, or other symptoms of a cold or flu. Do not treat yourself. This drug decreases your body's ability to fight infections. Try to avoid being around people who are sick. Do not become pregnant while taking this medicine or for 12 months after stopping it. Women should inform their doctor if they wish to become pregnant or think they might be pregnant. There is a potential for serious side effects to an unborn child. Talk to your health care professional or pharmacist for more information. What side effects may I notice from receiving this medicine? Side effects that you should report to your doctor or health care professional as soon as possible: -breathing problems -chest pain -dizziness or feeling faint -fast, irregular heartbeat -low blood counts - this medicine may decrease the number of white blood cells, red blood cells and platelets. You may be at increased risk for infections and bleeding. -mouth sores -redness, blistering, peeling or loosening of the skin, including inside the mouth (this can be added for any serious or exfoliative rash that could lead to hospitalization) -signs of infection - fever or chills, cough, sore throat, pain or difficulty passing urine -signs and symptoms of kidney injury like trouble passing urine or change in the amount of urine -signs and symptoms of liver injury like dark yellow or brown urine; general ill feeling or flu-like symptoms; light-colored stools; loss of appetite; nausea; right upper belly pain; unusually weak or tired; yellowing of the eyes or skin -stomach pain -vomiting Side effects that usually do not require medical attention (report to your doctor or health care professional if they continue or are bothersome): -headache -joint pain -muscle cramps or  muscle pain This list may not describe all possible side effects. Call your doctor for medical advice about side effects. You may report side effects to FDA at 1-800-FDA-1088. Where should I keep my medicine? This drug is given in a hospital or clinic and will not be stored at home. NOTE: This sheet is a summary. It may not cover all possible information. If you have questions about this medicine, talk to your doctor, pharmacist, or health care provider.  2018 Elsevier/Gold Standard (2016-04-12 15:28:09)

## 2017-01-18 ENCOUNTER — Ambulatory Visit (INDEPENDENT_AMBULATORY_CARE_PROVIDER_SITE_OTHER): Payer: Medicare Other | Admitting: Internal Medicine

## 2017-01-18 VITALS — BP 122/74 | HR 98 | Temp 98.9°F | Ht <= 58 in | Wt 247.0 lb

## 2017-01-18 DIAGNOSIS — Z23 Encounter for immunization: Secondary | ICD-10-CM

## 2017-01-18 DIAGNOSIS — E8881 Metabolic syndrome: Secondary | ICD-10-CM

## 2017-01-18 DIAGNOSIS — E784 Other hyperlipidemia: Secondary | ICD-10-CM | POA: Diagnosis not present

## 2017-01-18 DIAGNOSIS — L109 Pemphigus, unspecified: Secondary | ICD-10-CM | POA: Diagnosis not present

## 2017-01-18 DIAGNOSIS — Z86718 Personal history of other venous thrombosis and embolism: Secondary | ICD-10-CM

## 2017-01-18 DIAGNOSIS — N390 Urinary tract infection, site not specified: Secondary | ICD-10-CM

## 2017-01-18 DIAGNOSIS — N183 Chronic kidney disease, stage 3 unspecified: Secondary | ICD-10-CM

## 2017-01-18 DIAGNOSIS — I10 Essential (primary) hypertension: Secondary | ICD-10-CM

## 2017-01-18 DIAGNOSIS — M810 Age-related osteoporosis without current pathological fracture: Secondary | ICD-10-CM | POA: Diagnosis not present

## 2017-01-18 DIAGNOSIS — E119 Type 2 diabetes mellitus without complications: Secondary | ICD-10-CM

## 2017-01-18 DIAGNOSIS — E7849 Other hyperlipidemia: Secondary | ICD-10-CM

## 2017-01-18 DIAGNOSIS — Z0000 Encounter for general adult medical examination without abnormal findings: Secondary | ICD-10-CM

## 2017-01-18 LAB — POC URINALSYSI DIPSTICK (AUTOMATED)
SPEC GRAV UA: 1.025 (ref 1.010–1.025)
pH, UA: 6 (ref 5.0–8.0)

## 2017-01-18 NOTE — Progress Notes (Addendum)
Subjective:    Patient ID: Julie Mora, female    DOB: January 04, 1941, 76 y.o.   MRN: [id number]  HPI 76 year old Female for health maintenance exam and evaluation of medical issues. Has appt with Dr. Chalmers Cater soon for diabetes management.  Pt has started for infusions for pemphigus with Rituxan   ordered by Dr. Alen Blew. Has been treated with this before. It's been about 3 or 4 years since she had her last exacerbation with pemphigus. Her dermatologist is Dr. Denna Haggard.  Saw Dr. Fredna Dow for infected finger/paronychia long finger right hand April 26 and  April 30 and was treated with Septra. Required I and D of paronychia.  She had a history of frequent urinary tract infections. She saw urologist in [facility] in the past. Urinalysis by dipstick today is completely normal.  Lab work reviewed. Creatinine elevated at 1.16. CBC is normal. She had persistently elevated SGOT and SGPT of 149 and 150  respectively in January and  Alkaline phosphatase was mildly elevated at 149.  With regard to lipid panel, triglycerides are 158, otherwise lipid panel essentially within normal limits. Vitamin D is low at 27.  Liver functions are now normal.  Hemoglobin A1c is 10.2% and previously was 9% here 2 years ago. Dr. Chalmers Cater has interval hemoglobin A1c's.  TSH is normal.  She says she had a home physical exam by [place] [facility] and that blood was detected in a stool sample that they performed. She is requesting 3 Hemoccult cards today. We gave them to her.  History of chronic kidney disease related to diabetes and hypertension.  She sees ophthalmologist fairly regularly.  Needs Pneumovax 23.  For some time was treated with steroids for pemphigus. By April 2015 she was down to prednisone 5 mg every other day but by April 2016 she was off prednisone.  She is on chronic anticoagulation for history of left occipital parietal CVA in 2006 and history of DVT in 2010.  Remote history of iron deficiency.  She  sees ophthalmologist at [facility]. Says she needs a corneal transplant of the right. Left vision is good.  Diagnosis of pemphigus was made in 2010.  In November 2010 she was admitted with vomiting and acute renal failure. Subsequently was readmitted in 2010 with C. difficile colitis, adrenal insufficiency and anasarca due to multifactorial reasons. In early 2011 she developed an infected leg ulcer of her left lower leg and was bedbound for some time.  She was admitted to [facility] ICU Feb 2012 for presumed sepsis and tachycardia. Blood culture grew staph aureus and urine culture grew greater than 100,000 colonies per milliliter Escherichia coli.  She has been on Florinef since discharge from hospital February-March 2012. Several people have questioned why she is on Florinef. As I recall, it had something to do with her electrolyte balance. The discharge summary does not address it. Since she was receiving treatment for pemphigus, I kept her on the medication and did not discontinue it.  Her electrolytes have remained stable.    Social history: She is a widow. 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 Does not smoke or consume alcohol. She is a former Warehouse manager. No children.  Family history: Father died at age 12 reportedly of pancreatic cancer. Mother died of complications of old age and dementia. One sister in good health. No brothers.  History of osteoporosis treated with Fosamax.  History of stress and urge urinary incontinence and  has to wear protective underwear.    Review of Systems  Constitutional: Positive for fatigue.  HENT: Negative.   Respiratory: Negative.   Cardiovascular: Negative for chest pain and palpitations.  Gastrointestinal: Negative.   Neurological: Negative.   Psychiatric/Behavioral: Positive for dysphoric mood.       Objective:   Physical Exam  Constitutional: She is oriented to person, place, and  time. She appears well-developed and well-nourished. No distress.  HENT:  Head: Normocephalic and atraumatic.  Right Ear: External ear normal.  Left Ear: External ear normal.  Mouth/Throat: Oropharynx is clear and moist.  Eyes: Conjunctivae and EOM are normal. Pupils are equal, round, and reactive to light.  Neck: Neck supple. No JVD present. No thyromegaly present.  Cardiovascular: Normal rate, regular rhythm, normal heart sounds and intact distal pulses.   No murmur heard. Pulmonary/Chest: Effort normal and breath sounds normal. She has no wheezes.  Breast pendulous but normal female  Abdominal: Soft. Bowel sounds are normal. She exhibits no distension and no mass. There is no tenderness. There is no rebound and no guarding.  Small umbilical hernia that is reducible  Genitourinary:  Genitourinary Comments: Pap deferred due to age. Bimanual normal.  Musculoskeletal:  Stasis dermatitis changes in the lower extremities  Lymphadenopathy:    She has no cervical adenopathy.  Neurological: She is alert and oriented to person, place, and time. She has normal reflexes.  Skin: Skin is warm and dry. She is not diaphoretic.  Multiple lesions consistent with pemphigus. Has stasis dermatitis both lower extremities  Psychiatric: She has a normal mood and affect. Her behavior is normal. Judgment and thought content normal.  Vitals reviewed.         Assessment & Plan:  Exacerbation of pemphigus treated with Rituxan  Reducible small umbilical hernia  Obesity  History of frequent urinary infections-no evidence of UTI today  Recent paronychia right long finger treated by Dr. Fredna Dow with I and D and antibiotics  Diabetes mellitus-Followed by Dr. Chalmers Cater  Hypertension  Hyperlipidemia  Elevated liver functions-these are now normal and previously were elevated in January  Chronic kidney disease-stable  History of CVA treated with Pradaxa  Remote history of DVT  History of  cholelithiasis and choledocholithiasis  History of multiseptated cystic lesion lower pole of left kidney  History of cystic lesion pancreatic head that appears to be stable based on MRI 2017  Osteoporosis  Plan: Reminded regarding mammogram. Last one on file 2016. Had bone density study in 2016 and lowest T score was -3.2. Due for  bone density study this year. RTC 6 months Addendum: 01/25/2017 Nursing home doctor called recently and says he will taper her off of Florinef  Subjective:   Patient presents for Medicare Annual/Subsequent preventive examination.  Review Past Medical/Family/Social: see above   Risk Factors  Current exercise habits: sedentary Dietary issues discussed: low fat low carbohydrate  Cardiac risk factors: DM, Hyperlipidemia  Depression Screen  (Note: if answer to either of the following is "Yes", a more complete depression screening is indicated)   Over the past two weeks, have you felt down, depressed or hopeless?  Yes since learning I have pemphigus again Over the past two weeks, have you felt little interest or pleasure in doing things? Yes as above Have you lost interest or pleasure in daily life? No Do you often feel hopeless? No Do you cry easily over simple problems? No   Activities of Daily Living  In your present state of health, do you have  any difficulty performing the following activities?:   Driving? Yes- someone driving for her today Managing money? No  Feeding yourself? No  Getting from bed to chair? No  Climbing a flight of stairs? Yes due to ambulation issues Preparing food and eating?: No  Bathing or showering? No  Getting dressed: No  Getting to the toilet? No  Using the toilet:No  Moving around from place to place: yes walk with cane In the past year have you fallen or had a near fall?:yes Are you sexually active? No  Do you have more than one partner? No   Hearing Difficulties: No  Do you often ask people to speak up or  repeat themselves? No  Do you experience ringing or noises in your ears? No  Do you have difficulty understanding soft or whispered voices? No  Do you feel that you have a problem with memory? No Do you often misplace items?sometimes   Home Safety:  Do you have a smoke alarm at your residence? Yes Do you have grab bars in the bathroom?yes Do you have throw rugs in your house? no   Cognitive Testing  Alert? Yes Normal Appearance?Yes  Oriented to person? Yes Place? Yes  Time? Yes  Recall of three objects? Yes  Can perform simple calculations? Yes  Displays appropriate judgment?Yes  Can read the correct time from a watch face?Yes   List the Names of Other Physician/Practitioners you currently use:  See referral list for the physicians patient is currently seeing.  Dr. Alen Blew Dr. Denna Haggard Dr. Chalmers Cater Opthalmologist   Review of Systems: see above  Objective:     General appearance: Appears stated age and  obese  Head: Normocephalic, without obvious abnormality, atraumatic  Eyes: conj clear, EOMi PEERLA  Ears: normal TM's and external ear canals both ears  Nose: Nares normal. Septum midline. Mucosa normal. No drainage or sinus tenderness.  Throat: lips, mucosa, and tongue normal; teeth and gums normal  Neck: no adenopathy, no carotid bruit, no JVD, supple, symmetrical, trachea midline and thyroid not enlarged, symmetric, no tenderness/mass/nodules  No CVA tenderness.  Lungs: clear to auscultation bilaterally  Breasts: normal appearance, no masses or tenderness,  Heart: regular rate and rhythm, S1, S2 normal, no murmur, click, rub or gallop  Abdomen: soft, non-tender; bowel sounds normal; no masses, no organomegaly  Musculoskeletal: ROM normal in all joints, no crepitus, no deformity, Normal muscle strengthen. Back  is symmetric, no curvature. Skin: Skin color, texture, turgor normal. No rashes or lesions  Lymph nodes: Cervical, supraclavicular, and axillary nodes normal.    Neurologic: CN 2 -12 Normal, Normal symmetric reflexes. Normal coordination and gait  Psych: Alert & Oriented x 3, Mood appear stable.    Assessment:    Annual wellness medicare exam   Plan:    During the course of the visit the patient was educated and counseled about appropriate screening and preventive services including:   Pneumovax 23 given  Rec annual flu vaccine. Needs mammogram and bone density     Patient Instructions (the written plan) was given to the patient.  Medicare Attestation  I have personally reviewed:  The patient's medical and social history  Their use of alcohol, tobacco or illicit drugs  Their current medications and supplements  The patient's functional ability including ADLs,fall risks, home safety risks, cognitive, and hearing and visual impairment  Diet and physical activities  Evidence for depression or mood disorders  The patient's weight, height, BMI, and visual acuity have been recorded in the  chart. I have made referrals, counseling, and provided education to the patient based on review of the above and I have provided the patient with a written personalized care plan for preventive services.

## 2017-01-19 ENCOUNTER — Emergency Department (HOSPITAL_COMMUNITY): Payer: Medicare Other

## 2017-01-19 ENCOUNTER — Other Ambulatory Visit: Payer: Self-pay

## 2017-01-19 ENCOUNTER — Encounter (HOSPITAL_COMMUNITY): Payer: Self-pay

## 2017-01-19 ENCOUNTER — Inpatient Hospital Stay (HOSPITAL_COMMUNITY)
Admission: EM | Admit: 2017-01-19 | Discharge: 2017-01-22 | DRG: 871 | Disposition: A | Discharge status: Skilled Nursing Facility | Payer: Medicare Other | Attending: Internal Medicine | Admitting: Internal Medicine

## 2017-01-19 DIAGNOSIS — I10 Essential (primary) hypertension: Secondary | ICD-10-CM | POA: Diagnosis present

## 2017-01-19 DIAGNOSIS — Z86718 Personal history of other venous thrombosis and embolism: Secondary | ICD-10-CM

## 2017-01-19 DIAGNOSIS — R748 Abnormal levels of other serum enzymes: Secondary | ICD-10-CM | POA: Diagnosis present

## 2017-01-19 DIAGNOSIS — E559 Vitamin D deficiency, unspecified: Secondary | ICD-10-CM | POA: Diagnosis present

## 2017-01-19 DIAGNOSIS — R000 Tachycardia, unspecified: Secondary | ICD-10-CM | POA: Diagnosis present

## 2017-01-19 DIAGNOSIS — B952 Enterococcus as the cause of diseases classified elsewhere: Secondary | ICD-10-CM | POA: Diagnosis present

## 2017-01-19 DIAGNOSIS — A419 Sepsis, unspecified organism: Principal | ICD-10-CM | POA: Diagnosis present

## 2017-01-19 DIAGNOSIS — R609 Edema, unspecified: Secondary | ICD-10-CM | POA: Diagnosis present

## 2017-01-19 DIAGNOSIS — L109 Pemphigus, unspecified: Secondary | ICD-10-CM | POA: Diagnosis present

## 2017-01-19 DIAGNOSIS — Z6841 Body Mass Index (BMI) 40.0 and over, adult: Secondary | ICD-10-CM

## 2017-01-19 DIAGNOSIS — E7800 Pure hypercholesterolemia, unspecified: Secondary | ICD-10-CM | POA: Diagnosis present

## 2017-01-19 DIAGNOSIS — J690 Pneumonitis due to inhalation of food and vomit: Secondary | ICD-10-CM | POA: Diagnosis not present

## 2017-01-19 DIAGNOSIS — Z7983 Long term (current) use of bisphosphonates: Secondary | ICD-10-CM

## 2017-01-19 DIAGNOSIS — R627 Adult failure to thrive: Secondary | ICD-10-CM | POA: Diagnosis present

## 2017-01-19 DIAGNOSIS — Z888 Allergy status to other drugs, medicaments and biological substances status: Secondary | ICD-10-CM

## 2017-01-19 DIAGNOSIS — N390 Urinary tract infection, site not specified: Secondary | ICD-10-CM | POA: Diagnosis present

## 2017-01-19 DIAGNOSIS — E872 Acidosis: Secondary | ICD-10-CM | POA: Diagnosis present

## 2017-01-19 DIAGNOSIS — E785 Hyperlipidemia, unspecified: Secondary | ICD-10-CM | POA: Diagnosis present

## 2017-01-19 DIAGNOSIS — Z794 Long term (current) use of insulin: Secondary | ICD-10-CM

## 2017-01-19 DIAGNOSIS — N179 Acute kidney failure, unspecified: Secondary | ICD-10-CM | POA: Diagnosis present

## 2017-01-19 DIAGNOSIS — Z79899 Other long term (current) drug therapy: Secondary | ICD-10-CM

## 2017-01-19 DIAGNOSIS — Z7901 Long term (current) use of anticoagulants: Secondary | ICD-10-CM

## 2017-01-19 DIAGNOSIS — E119 Type 2 diabetes mellitus without complications: Secondary | ICD-10-CM | POA: Diagnosis present

## 2017-01-19 LAB — URINALYSIS, ROUTINE W REFLEX MICROSCOPIC
Bilirubin Urine: NEGATIVE
GLUCOSE, UA: NEGATIVE mg/dL
HGB URINE DIPSTICK: NEGATIVE
Ketones, ur: NEGATIVE mg/dL
NITRITE: NEGATIVE
Protein, ur: NEGATIVE mg/dL
SPECIFIC GRAVITY, URINE: 1.017 (ref 1.005–1.030)
pH: 5 (ref 5.0–8.0)

## 2017-01-19 LAB — CBC WITH DIFFERENTIAL/PLATELET
BASOS ABS: 0 10*3/uL (ref 0.0–0.1)
BASOS PCT: 0 %
EOS ABS: 0.5 10*3/uL (ref 0.0–0.7)
EOS PCT: 5 %
HCT: 38.5 % (ref 36.0–46.0)
Hemoglobin: 13 g/dL (ref 12.0–15.0)
Lymphocytes Relative: 6 %
Lymphs Abs: 0.6 10*3/uL — ABNORMAL LOW (ref 0.7–4.0)
MCH: 32.1 pg (ref 26.0–34.0)
MCHC: 33.8 g/dL (ref 30.0–36.0)
MCV: 95.1 fL (ref 78.0–100.0)
MONO ABS: 0.8 10*3/uL (ref 0.1–1.0)
Monocytes Relative: 8 %
NEUTROS ABS: 8.2 10*3/uL — AB (ref 1.7–7.7)
Neutrophils Relative %: 81 %
PLATELETS: 188 10*3/uL (ref 150–400)
RBC: 4.05 MIL/uL (ref 3.87–5.11)
RDW: 13.2 % (ref 11.5–15.5)
WBC: 10 10*3/uL (ref 4.0–10.5)

## 2017-01-19 LAB — COMPREHENSIVE METABOLIC PANEL
ALK PHOS: 59 U/L (ref 38–126)
ALT: 20 U/L (ref 14–54)
AST: 30 U/L (ref 15–41)
Albumin: 2.5 g/dL — ABNORMAL LOW (ref 3.5–5.0)
Anion gap: 7 (ref 5–15)
BUN: 26 mg/dL — AB (ref 6–20)
CALCIUM: 8.4 mg/dL — AB (ref 8.9–10.3)
CHLORIDE: 107 mmol/L (ref 101–111)
CO2: 24 mmol/L (ref 22–32)
CREATININE: 1.05 mg/dL — AB (ref 0.44–1.00)
GFR calc Af Amer: 59 mL/min — ABNORMAL LOW (ref >60)
GFR, EST NON AFRICAN AMERICAN: 51 mL/min — AB (ref >60)
Glucose, Bld: 269 mg/dL — ABNORMAL HIGH (ref 65–99)
Potassium: 4.2 mmol/L (ref 3.5–5.1)
Sodium: 138 mmol/L (ref 135–145)
Total Bilirubin: 0.2 mg/dL — ABNORMAL LOW (ref 0.3–1.2)
Total Protein: 5.9 g/dL — ABNORMAL LOW (ref 6.5–8.1)

## 2017-01-19 LAB — I-STAT CG4 LACTIC ACID, ED
LACTIC ACID, VENOUS: 2.87 mmol/L — AB (ref 0.5–1.9)
LACTIC ACID, VENOUS: 1.21 mmol/L (ref 0.5–1.9)

## 2017-01-19 LAB — GLUCOSE, CAPILLARY
GLUCOSE-CAPILLARY: 258 mg/dL — AB (ref 65–99)
GLUCOSE-CAPILLARY: 172 mg/dL — AB (ref 65–99)
Glucose-Capillary: 221 mg/dL — ABNORMAL HIGH (ref 65–99)
Glucose-Capillary: 269 mg/dL — ABNORMAL HIGH (ref 65–99)

## 2017-01-19 LAB — MRSA PCR SCREENING: MRSA BY PCR: NEGATIVE

## 2017-01-19 MED ORDER — DABIGATRAN ETEXILATE MESYLATE 75 MG PO CAPS
75.0000 mg | ORAL_CAPSULE | Freq: Two times a day (BID) | ORAL | Status: DC
  Administered 2017-01-19 – 2017-01-22 (×7): 75 mg via ORAL
  Filled 2017-01-19 (×8): qty 1

## 2017-01-19 MED ORDER — INSULIN ASPART 100 UNIT/ML ~~LOC~~ SOLN
0.0000 [IU] | Freq: Every day | SUBCUTANEOUS | Status: DC
  Administered 2017-01-20: 2 [IU] via SUBCUTANEOUS
  Administered 2017-01-21: 3 [IU] via SUBCUTANEOUS

## 2017-01-19 MED ORDER — SODIUM CHLORIDE 0.9 % IV BOLUS (SEPSIS): 500.0000 mL | Freq: Once | INTRAVENOUS | Status: DC

## 2017-01-19 MED ORDER — SIMVASTATIN 20 MG PO TABS
20.0000 mg | ORAL_TABLET | Freq: Every day | ORAL | Status: DC
  Administered 2017-01-19 – 2017-01-21 (×3): 20 mg via ORAL
  Filled 2017-01-19 (×3): qty 1

## 2017-01-19 MED ORDER — PIPERACILLIN-TAZOBACTAM 3.375 G IVPB
3.3750 g | Freq: Three times a day (TID) | INTRAVENOUS | Status: DC
  Administered 2017-01-19 – 2017-01-22 (×8): 3.375 g via INTRAVENOUS
  Filled 2017-01-19 (×10): qty 50

## 2017-01-19 MED ORDER — FERROUS GLUCONATE 324 (38 FE) MG PO TABS
325.0000 mg | ORAL_TABLET | Freq: Two times a day (BID) | ORAL | Status: DC
  Filled 2017-01-19: qty 2

## 2017-01-19 MED ORDER — INSULIN ASPART 100 UNIT/ML ~~LOC~~ SOLN
0.0000 [IU] | Freq: Three times a day (TID) | SUBCUTANEOUS | Status: DC
Start: 2017-01-19 — End: 2017-01-22
  Administered 2017-01-19 (×2): 5 [IU] via SUBCUTANEOUS
  Administered 2017-01-20: 2 [IU] via SUBCUTANEOUS
  Administered 2017-01-20: 3 [IU] via SUBCUTANEOUS
  Administered 2017-01-20 – 2017-01-21 (×2): 1 [IU] via SUBCUTANEOUS
  Administered 2017-01-21 (×2): 3 [IU] via SUBCUTANEOUS
  Administered 2017-01-22: 5 [IU] via SUBCUTANEOUS

## 2017-01-19 MED ORDER — VANCOMYCIN HCL 10 G IV SOLR
1250.0000 mg | INTRAVENOUS | Status: DC
  Administered 2017-01-20 – 2017-01-21 (×2): 1250 mg via INTRAVENOUS
  Filled 2017-01-19 (×2): qty 1250

## 2017-01-19 MED ORDER — SODIUM CHLORIDE 0.9 % IV BOLUS (SEPSIS)
1000.0000 mL | Freq: Once | INTRAVENOUS | Status: AC
  Administered 2017-01-19: 1000 mL via INTRAVENOUS

## 2017-01-19 MED ORDER — FERROUS GLUCONATE 324 (38 FE) MG PO TABS
324.0000 mg | ORAL_TABLET | Freq: Two times a day (BID) | ORAL | Status: DC
  Administered 2017-01-19 – 2017-01-22 (×7): 324 mg via ORAL
  Filled 2017-01-19 (×6): qty 1

## 2017-01-19 MED ORDER — INSULIN GLARGINE 100 UNIT/ML SOLOSTAR PEN: 30.0000 [IU] | PEN_INJECTOR | Freq: Every day | SUBCUTANEOUS | Status: DC

## 2017-01-19 MED ORDER — INSULIN GLARGINE 100 UNIT/ML ~~LOC~~ SOLN
30.0000 [IU] | Freq: Every day | SUBCUTANEOUS | Status: DC
  Administered 2017-01-19 – 2017-01-21 (×3): 30 [IU] via SUBCUTANEOUS
  Filled 2017-01-19 (×4): qty 0.3

## 2017-01-19 MED ORDER — ACETAMINOPHEN 325 MG PO TABS
650.0000 mg | ORAL_TABLET | Freq: Four times a day (QID) | ORAL | Status: DC | PRN
  Administered 2017-01-19: 650 mg via ORAL
  Filled 2017-01-19: qty 2

## 2017-01-19 MED ORDER — SODIUM CHLORIDE 0.9 % IV SOLN: INTRAVENOUS | Status: DC

## 2017-01-19 MED ORDER — FLUDROCORTISONE ACETATE 0.1 MG PO TABS
200.0000 ug | ORAL_TABLET | Freq: Two times a day (BID) | ORAL | Status: DC
  Administered 2017-01-19 – 2017-01-22 (×7): 200 ug via ORAL
  Filled 2017-01-19 (×8): qty 2

## 2017-01-19 MED ORDER — PREDNISOLONE ACETATE 1 % OP SUSP
1.0000 [drp] | Freq: Two times a day (BID) | OPHTHALMIC | Status: DC
  Administered 2017-01-19 – 2017-01-22 (×7): 1 [drp] via OPHTHALMIC
  Filled 2017-01-19: qty 1

## 2017-01-19 MED ORDER — PIPERACILLIN-TAZOBACTAM 3.375 G IVPB 30 MIN
3.3750 g | Freq: Once | INTRAVENOUS | Status: AC
  Administered 2017-01-19: 3.375 g via INTRAVENOUS
  Filled 2017-01-19: qty 50

## 2017-01-19 MED ORDER — VANCOMYCIN HCL 10 G IV SOLR
2000.0000 mg | Freq: Once | INTRAVENOUS | Status: AC
  Administered 2017-01-19: 2000 mg via INTRAVENOUS
  Filled 2017-01-19: qty 2000

## 2017-01-19 MED ORDER — VANCOMYCIN HCL IN DEXTROSE 1-5 GM/200ML-% IV SOLN: 1000.0000 mg | Freq: Once | INTRAVENOUS | Status: DC

## 2017-01-19 MED ORDER — GABAPENTIN 300 MG PO CAPS
300.0000 mg | ORAL_CAPSULE | Freq: Every day | ORAL | Status: DC
  Administered 2017-01-19 – 2017-01-21 (×3): 300 mg via ORAL
  Filled 2017-01-19 (×3): qty 1

## 2017-01-19 NOTE — ED Notes (Signed)
20 minute timer started to take patient upstairs.

## 2017-01-19 NOTE — Progress Notes (Signed)
Pharmacy Antibiotic Note  Julie Mora is a 76 y.o. 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  Pharmacy has been consulted for zosyn/vancomycin dosing.  Plan: Zosyn 3.375g IV q8h (4 hour infusion).  Vancomycin 2 Gm x1 then 1250 mg IV q24h VT=15-20 mg/L Daily SCr f/u cultures/levels   Height: 5\' 5"  (165.1 cm) Weight: 247 lb (112 kg) IBW/kg (Calculated) : 57  Temp (24hrs), Avg:100.7 F (38.2 C), Min:98.9 F (37.2 C), Max:102.5 F (39.2 C)   Recent Labs Lab 01/17/17 0843  WBC 7.5  CREATININE 1.1    Estimated Creatinine Clearance: 55.1 mL/min (by C-G formula based on SCr of 1.1 mg/dL).    Allergies  Allergen Reactions  . Azathioprine Shortness Of Breath  . Cellcept [Mycophenolate Mofetil] Nausea Only    severe nausea  . Ramipril Cough    Antimicrobials this admission: 5/4 zosyn >>  5/4 vancomycin >>   Dose adjustments this admission:   Microbiology results:  BCx:   UCx:    Sputum:    MRSA PCR:   Thank you for allowing pharmacy to be a part of this patient's care.  Dorrene German 01/19/2017 4:40 AM

## 2017-01-19 NOTE — ED Provider Notes (Signed)
[facility] DEPT Provider Note   CSN: [id number] Arrival date & time: 01/19/17  3267     History   Chief Complaint Chief Complaint  Patient presents with  . Fever    HPI Julie Mora is a 76 y.o. female.  The history is provided by the patient.  Fever   This is a new problem. The current episode started 3 to 5 hours ago. The problem occurs constantly. The problem has not changed since onset.The temperature was taken using an oral thermometer. Associated symptoms include diarrhea. Pertinent negatives include no chest pain, no fussiness, no sleepiness, no vomiting, no congestion, no headaches, no sore throat, no tugging at ear, no muscle aches and no cough. She has tried nothing for the symptoms. The treatment provided no relief.    Past Medical History:  Diagnosis Date  . Anemia   . Essential hypertension, benign   . Incontinence   . Other and unspecified hyperlipidemia   . Pemphigus foliaceous   . Renal insufficiency   . Type II or unspecified type diabetes mellitus without mention of complication, not stated as uncontrolled   . Vitamin D deficiency     Patient Active Problem List   Diagnosis Date Noted  . Osteoporosis 12/15/2014  . Elevated liver enzymes 11/17/2012  . Metabolic syndrome 12/45/8099  . Chronic use of steroids 04/19/2011  . History of deep venous thrombosis 04/19/2011  . Vitamin D deficiency 04/19/2011  . History of iron deficiency 04/19/2011  . Dependent edema 04/19/2011  . Mixed stress and urge urinary incontinence 04/19/2011  . Urinary tract bacterial infections 04/19/2011  . CEREBROVASCULAR ACCIDENT, ACUTE 06/25/2009  . PEMPHIGUS FOLIACEOUS 06/25/2009  . IDDM 05/28/2009  . HYPERCHOLESTEROLEMIA 05/28/2009  . HYPERTENSION 05/28/2009    History reviewed. No pertinent surgical history.  OB History    No data available       Home Medications    Prior to Admission medications   Medication Sig Start Date End Date Taking? Authorizing  Provider  acetaminophen (TYLENOL) 500 MG tablet Take 500 mg by mouth every 6 (six) hours as needed for pain.   Yes Historical Provider, MD  alendronate (FOSAMAX) 70 MG tablet TAKE 1 TABLET(70 MG) BY MOUTH EVERY 7 DAYS WITH A FULL GLASS OF WATER AND ON AN EMPTY STOMACH 06/28/16 06/28/17 Yes Elby Showers, MD  CALCIUM ASCORBATE PO Take 1 tablet by mouth daily.   Yes Historical Provider, MD  Cholecalciferol (VITAMIN D) 2000 UNITS tablet Take 2,000 Units by mouth daily.     Yes Historical Provider, MD  ferrous gluconate (FERGON) 325 MG tablet Take 325 mg by mouth 2 (two) times daily.     Yes Historical Provider, MD  fludrocortisone (FLORINEF) 0.1 MG tablet TAKE 2 TABLETS BY MOUTH TWICE DAILY 11/27/16  Yes Elby Showers, MD  gabapentin (NEURONTIN) 300 MG capsule TK ONE C PO HS 06/23/15  Yes Historical Provider, MD  insulin aspart (NOVOLOG) 100 UNIT/ML injection Inject 5-12 Units into the skin 2 (two) times daily as needed for high blood sugar. 12/15/14  Yes Elby Showers, MD  Insulin Glargine (LANTUS SOLOSTAR) 100 UNIT/ML Solostar Pen Inject 35 units in skin at bedtime 01/01/14  Yes Elby Showers, MD  Lactobacillus Rhamnosus, GG, (CULTURELLE PO) Take 1 capsule by mouth daily.   Yes Historical Provider, MD  multivitamin [facility]) per tablet Take 1 tablet by mouth daily.     Yes Historical Provider, MD  phenazopyridine (PYRIDIUM) 95 MG tablet Take 95 mg by mouth 2 (two)  times daily.   Yes Historical Provider, MD  Polyethyl Glycol-Propyl Glycol (SYSTANE) 0.4-0.3 % SOLN Apply 1 drop to eye 3 (three) times daily. Both eyes, as needed   Yes Historical Provider, MD  PRADAXA 75 MG CAPS capsule TAKE 1 CAPSULE(75 MG) BY MOUTH TWICE DAILY 12/09/16  Yes Elby Showers, MD  prednisoLONE acetate (PRED FORTE) 1 % ophthalmic suspension Place 1 drop into both eyes 2 (two) times daily.  05/08/12  Yes Historical Provider, MD  simvastatin (ZOCOR) 20 MG tablet TAKE 1 TABLET BY MOUTH EVERY NIGHT AT BEDTIME 01/09/17  Yes Elby Showers, MD  sulfamethoxazole-trimethoprim (BACTRIM DS,SEPTRA DS) 800-160 MG tablet Take 1 tablet by mouth 2 (two) times daily. 01/10/17  Yes Historical Provider, MD  triamcinolone cream (KENALOG) 0.1 % Apply 1 application topically daily. 01/11/17  Yes Historical Provider, MD  [facility] test strip USE TO CHECK BLOOD SUGAR BEFORE BREAKFAST AND SUPPER 11/09/14   Elby Showers, MD  alendronate (FOSAMAX) 70 MG tablet TAKE 1 TABLET(70 MG) BY MOUTH EVERY 7 DAYS WITH A FULL GLASS OF WATER AND ON AN EMPTY STOMACH Patient not taking: Reported on 01/19/2017 01/01/17   Elby Showers, MD  amoxicillin-clavulanate (AUGMENTIN) 875-125 MG tablet Take 1 tablet by mouth 2 (two) times daily. Patient not taking: Reported on 01/19/2017 09/13/15   Elby Showers, MD  BD PEN NEEDLE NANO U/F 32G X 4 MM MISC USE AS DIRECTED 01/11/15   Elby Showers, MD  ciprofloxacin (CIPRO) 250 MG tablet Take 1 tablet (250 mg total) by mouth 2 (two) times daily. Patient not taking: Reported on 01/19/2017 07/26/15   Elby Showers, MD  ergocalciferol (VITAMIN D2) 50000 UNITS capsule Take 1 capsule (50,000 Units total) by mouth once a week. Patient not taking: Reported on 01/19/2017 01/14/15   Elby Showers, MD  fludrocortisone (FLORINEF) 0.1 MG tablet TAKE 2 TABLETS BY MOUTH TWICE DAILY Patient not taking: Reported on 01/19/2017 05/25/15   Elby Showers, MD  potassium chloride (K-DUR,KLOR-CON) 10 MEQ tablet TAKE 5 TABLETS BY MOUTH TWICE DAILY Patient not taking: Reported on 01/19/2017 08/24/13   Elby Showers, MD  torsemide (DEMADEX) 20 MG tablet TAKE 3 TABLETS BY MOUTH EVERY DAY Patient not taking: Reported on 01/19/2017 04/13/12   Elby Showers, MD    Family History Family History  Problem Relation Age of Onset  . Cancer Father     Social History Social History  Substance Use Topics  . Smoking status: Never Smoker  . Smokeless tobacco: Never Used  . Alcohol use No     Allergies   Azathioprine; Cellcept [mycophenolate mofetil]; and  Ramipril   Review of Systems Review of Systems  Constitutional: Positive for fever.  HENT: Negative for congestion, sore throat, trouble swallowing and voice change.   Respiratory: Negative for cough.   Cardiovascular: Negative for chest pain.  Gastrointestinal: Positive for diarrhea. Negative for abdominal pain and vomiting.  Genitourinary: Negative for dysuria.  Musculoskeletal: Negative for back pain and myalgias.  Neurological: Negative for headaches.  All other systems reviewed and are negative.    Physical Exam Updated Vital Signs BP (!) 144/82 (BP Location: Left Arm)   Pulse (!) 102   Temp (!) 100.6 F (38.1 C) (Oral)   Resp 17   Ht 5\' 5"  (1.651 m)   Wt 247 lb (112 kg)   LMP  (LMP Unknown)   SpO2 97%   BMI 41.10 kg/m   Physical Exam  Constitutional: She  is oriented to person, place, and time. She appears well-developed and well-nourished.  HENT:  Head: Normocephalic and atraumatic.  Mouth/Throat: Oropharynx is clear and moist. No oropharyngeal exudate.  Eyes: Conjunctivae are normal. Pupils are equal, round, and reactive to light.  Neck: Normal range of motion. Neck supple.  Cardiovascular: Regular rhythm.  Tachycardia present.   Pulmonary/Chest: No respiratory distress. She has no wheezes. She has rales. She exhibits no tenderness.  Abdominal: Soft. Bowel sounds are normal. She exhibits no mass. There is no tenderness. There is no rebound and no guarding.  Musculoskeletal: Normal range of motion. She exhibits no deformity.  Lymphadenopathy:    She has no cervical adenopathy.  Neurological: She is alert and oriented to person, place, and time. She displays normal reflexes.  Skin: Skin is warm and dry. Capillary refill takes less than 2 seconds. She is not diaphoretic.  Psychiatric: She has a normal mood and affect.     ED Treatments / Results   Vitals:   01/19/17 0457 01/19/17 0510  BP:  (!) 144/82  Pulse:  (!) 102  Resp:  17  Temp: (!) 100.6 F (38.1  C)   ;s  Labs (all labs ordered are listed, but only abnormal results are displayed) Labs Reviewed  CBC WITH DIFFERENTIAL/PLATELET - Abnormal; Notable for the following:       Result Value   Neutro Abs 8.2 (*)    Lymphs Abs 0.6 (*)    All other components within normal limits  I-STAT CG4 LACTIC ACID, ED - Abnormal; Notable for the following:    Lactic Acid, Venous 2.87 (*)    All other components within normal limits  CULTURE, BLOOD (ROUTINE X 2)  CULTURE, BLOOD (ROUTINE X 2)  URINE CULTURE  URINALYSIS, ROUTINE W REFLEX MICROSCOPIC  COMPREHENSIVE METABOLIC PANEL    EKG  EKG Interpretation  Date/Time:  Friday Jan 19 2017 04:31:08 EDT Ventricular Rate:  110 PR Interval:    QRS Duration: 95 QT Interval:  329 QTC Calculation: 445 R Axis:   -67 Text Interpretation:  Sinus tachycardia Confirmed by Dory Horn) on 01/19/2017 5:38:39 AM       Radiology Dg Chest Port 1 View  Result Date: 01/19/2017 CLINICAL DATA:  Fever and sepsis EXAM: PORTABLE CHEST 1 VIEW COMPARISON:  11/15/2010 FINDINGS: Central and basilar right lung opacities. Left lung is clear. No large effusions. Normal pulmonary vasculature. IMPRESSION: Patchy opacities in the central and basilar right lung. This could represent early pneumonia. Followup PA and lateral chest X-ray is recommended in 3-4 weeks following trial of antibiotic therapy to ensure resolution and exclude underlying malignancy. Electronically Signed   By: Andreas Newport M.D.   On: 01/19/2017 04:59    Procedures Procedures (including critical care time)  Medications Ordered in ED Medications  sodium chloride 0.9 % bolus 1,000 mL (1,000 mLs Intravenous New Bag/Given 01/19/17 0438)    And  sodium chloride 0.9 % bolus 1,000 mL (1,000 mLs Intravenous New Bag/Given 01/19/17 0438)    And  sodium chloride 0.9 % bolus 1,000 mL (not administered)    And  sodium chloride 0.9 % bolus 500 mL (not administered)  vancomycin (VANCOCIN) 2,000 mg in  sodium chloride 0.9 % 500 mL IVPB (not administered)  piperacillin-tazobactam (ZOSYN) IVPB 3.375 g (3.375 g Intravenous New Bag/Given 01/19/17 0454)   MDM Reviewed: previous chart, nursing note and vitals Interpretation: labs, ECG and x-ray (normal white count, elevated lactic acid.  CXR with PNA by me) Total time providing critical  care: > 105 minutes. This excludes time spent performing separately reportable procedures and services. Consults: admitting MD  CRITICAL CARE Performed by: Carlisle Beers Total critical care time: 20 minutes Critical care time was exclusive of separately billable procedures and treating other patients. Critical care was necessary to treat or prevent imminent or life-threatening deterioration. Critical care was time spent personally by me on the following activities: development of treatment plan with patient and/or surrogate as well as nursing, discussions with consultants, evaluation of patient's response to treatment, examination of patient, obtaining history from patient or surrogate, ordering and performing treatments and interventions, ordering and review of laboratory studies, ordering and review of radiographic studies, pulse oximetry and re-evaluation of patient's condition.  Final Clinical Impressions(s) / ED Diagnoses  Code sepsis:  Admit for sepsis    Vashawn Ekstein, MD 01/19/17 [phone]

## 2017-01-19 NOTE — ED Notes (Signed)
Bed: KG88 Expected date:  Expected time:  Means of arrival:  Comments: 76 yo F/ FEVER

## 2017-01-19 NOTE — ED Notes (Signed)
Notified EDP,Palumbo,MD., pt. I-stat CG4 Lactic acid results 2.87 and RN,Celestial made aware.

## 2017-01-19 NOTE — Care Management Note (Signed)
Case Management Note  Patient Details  Name: Julie Mora MRN: [id number] Date of Birth: May 04, 1941  Subjective/Objective:   In the ED, she was found to be febrile to 102.5, tachycardic to 114, her blood pressure was stable and she was satting well on room air.  Chest x-ray showed patchy opacities in the central and basilar right lung, concerning for early pneumonia.  She was treated for sepsis, placed on vancomycin and Zosyn, and TRH was asked for admission                 Action/Plan:    Expected Discharge Date:  01/20/17               Expected Discharge Plan:  [place]  In-House Referral:     Discharge planning Services  CM Consult  Post Acute Care Choice:    Choice offered to:     DME Arranged:    DME Agency:     HH Arranged:    HH Agency:     Status of Service:  In process, will continue to follow  If discussed at Long Length of Stay Meetings, dates discussed:    Additional CommentsPurcell Mouton, RN 01/19/2017, 3:04 PM

## 2017-01-19 NOTE — H&P (Signed)
History and Physical    Julie Mora WJX:[id number] DOB: Jan 30, 1941 DOA: 01/19/2017  I have briefly reviewed the patient's prior medical records in [place]  PCP: Elby Showers, MD  Patient coming from: home  Chief Complaint: AMS  HPI: Julie Mora is a 76 y.o. female with medical history significant of pemphigus foliaceous, type 2 diabetes, hypertension, hyperlipidemia, prior history of DVT on chronic anticoagulation, presents to the emergency room with chief complaint of altered mental status and fever.  On my evaluation the emergency room, patient feels well, she is alert and oriented 4, and initially is refusing admission.  She tells me she does not know what happened, she was in her recliner watching TV last night, when she felt cramping in her legs, thought her potassium was low and went to the kitchen and drank a glass of orange juice.  Following that, she had nausea and an episode of vomiting.  Her sister checked on her little bit later and found the patient in the recliner with emesis all around her mouth and her chest, and she was feeling very hot and was a little bit confused.  She was brought to the ED.  Patient is taking rituximab as an outpatient for her pemphigus foliaceous, and had an infusion about 2 days ago.  She saw her PCP yesterday and had a pneumonia shot.  She felt her arm was very sore and warm last night.  She has no abdominal pain, currently has no nausea and wants to eat.  She had a finger infection at the end of last month, and was placed on antibiotics which she still taking.  She had a diarrheal episode about 2 days ago, watery, but has resolved since.  ED Course: In the ED, she was found to be febrile to 102.5, tachycardic to 114, her blood pressure was stable and she was satting well on room air.  Chest x-ray showed patchy opacities in the central and basilar right lung, concerning for early pneumonia.  She was treated for sepsis, placed on vancomycin and  Zosyn, and TRH was asked for admission.  Review of Systems: As per HPI otherwise 10 point review of systems negative.   Past Medical History:  Diagnosis Date  . Anemia   . Essential hypertension, benign   . Incontinence   . Other and unspecified hyperlipidemia   . Pemphigus foliaceous   . Renal insufficiency   . Type II or unspecified type diabetes mellitus without mention of complication, not stated as uncontrolled   . Vitamin D deficiency     History reviewed. No pertinent surgical history.   reports that she has never smoked. She has never used smokeless tobacco. She reports that she does not drink alcohol or use drugs.  Allergies  Allergen Reactions  . Azathioprine Shortness Of Breath  . Cellcept [Mycophenolate Mofetil] Nausea Only    severe nausea  . Ramipril Cough    Family History  Problem Relation Age of Onset  . Cancer Father     Prior to Admission medications   Medication Sig Start Date End Date Taking? Authorizing Provider  acetaminophen (TYLENOL) 500 MG tablet Take 500 mg by mouth every 6 (six) hours as needed for pain.   Yes Historical Provider, MD  alendronate (FOSAMAX) 70 MG tablet TAKE 1 TABLET(70 MG) BY MOUTH EVERY 7 DAYS WITH A FULL GLASS OF WATER AND ON AN EMPTY STOMACH 06/28/16 06/28/17 Yes Elby Showers, MD  CALCIUM ASCORBATE PO Take 1 tablet by mouth  daily.   Yes Historical Provider, MD  Cholecalciferol (VITAMIN D) 2000 UNITS tablet Take 2,000 Units by mouth daily.     Yes Historical Provider, MD  ferrous gluconate (FERGON) 325 MG tablet Take 325 mg by mouth 2 (two) times daily.     Yes Historical Provider, MD  fludrocortisone (FLORINEF) 0.1 MG tablet TAKE 2 TABLETS BY MOUTH TWICE DAILY 11/27/16  Yes Elby Showers, MD  gabapentin (NEURONTIN) 300 MG capsule TK ONE C PO HS 06/23/15  Yes Historical Provider, MD  insulin aspart (NOVOLOG) 100 UNIT/ML injection Inject 5-12 Units into the skin 2 (two) times daily as needed for high blood sugar. 12/15/14  Yes Elby Showers, MD  Insulin Glargine (LANTUS SOLOSTAR) 100 UNIT/ML Solostar Pen Inject 35 units in skin at bedtime 01/01/14  Yes Elby Showers, MD  Lactobacillus Rhamnosus, GG, (CULTURELLE PO) Take 1 capsule by mouth daily.   Yes Historical Provider, MD  multivitamin [facility]) per tablet Take 1 tablet by mouth daily.     Yes Historical Provider, MD  phenazopyridine (PYRIDIUM) 95 MG tablet Take 95 mg by mouth 2 (two) times daily.   Yes Historical Provider, MD  Polyethyl Glycol-Propyl Glycol (SYSTANE) 0.4-0.3 % SOLN Apply 1 drop to eye 3 (three) times daily. Both eyes, as needed   Yes Historical Provider, MD  PRADAXA 75 MG CAPS capsule TAKE 1 CAPSULE(75 MG) BY MOUTH TWICE DAILY 12/09/16  Yes Elby Showers, MD  prednisoLONE acetate (PRED FORTE) 1 % ophthalmic suspension Place 1 drop into both eyes 2 (two) times daily.  05/08/12  Yes Historical Provider, MD  simvastatin (ZOCOR) 20 MG tablet TAKE 1 TABLET BY MOUTH EVERY NIGHT AT BEDTIME 01/09/17  Yes Elby Showers, MD  sulfamethoxazole-trimethoprim (BACTRIM DS,SEPTRA DS) 800-160 MG tablet Take 1 tablet by mouth 2 (two) times daily. 01/10/17  Yes Historical Provider, MD  triamcinolone cream (KENALOG) 0.1 % Apply 1 application topically daily. 01/11/17  Yes Historical Provider, MD  [place] test strip USE TO CHECK BLOOD SUGAR BEFORE BREAKFAST AND SUPPER 11/09/14   Elby Showers, MD  alendronate (FOSAMAX) 70 MG tablet TAKE 1 TABLET(70 MG) BY MOUTH EVERY 7 DAYS WITH A FULL GLASS OF WATER AND ON AN EMPTY STOMACH Patient not taking: Reported on 01/19/2017 01/01/17   Elby Showers, MD  amoxicillin-clavulanate (AUGMENTIN) 875-125 MG tablet Take 1 tablet by mouth 2 (two) times daily. Patient not taking: Reported on 01/19/2017 09/13/15   Elby Showers, MD  BD PEN NEEDLE NANO U/F 32G X 4 MM MISC USE AS DIRECTED 01/11/15   Elby Showers, MD  ciprofloxacin (CIPRO) 250 MG tablet Take 1 tablet (250 mg total) by mouth 2 (two) times daily. Patient not taking: Reported on  01/19/2017 07/26/15   Elby Showers, MD  ergocalciferol (VITAMIN D2) 50000 UNITS capsule Take 1 capsule (50,000 Units total) by mouth once a week. Patient not taking: Reported on 01/19/2017 01/14/15   Elby Showers, MD  fludrocortisone (FLORINEF) 0.1 MG tablet TAKE 2 TABLETS BY MOUTH TWICE DAILY Patient not taking: Reported on 01/19/2017 05/25/15   Elby Showers, MD  potassium chloride (K-DUR,KLOR-CON) 10 MEQ tablet TAKE 5 TABLETS BY MOUTH TWICE DAILY Patient not taking: Reported on 01/19/2017 08/24/13   Elby Showers, MD  torsemide (DEMADEX) 20 MG tablet TAKE 3 TABLETS BY MOUTH EVERY DAY Patient not taking: Reported on 01/19/2017 04/13/12   Elby Showers, MD    Physical Exam: Vitals:   01/19/17 7824 01/19/17  0630 01/19/17 0635 01/19/17 0707  BP: 140/88 (!) 144/85 (!) 144/85   Pulse: (!) 102 100 96   Resp: (!) 28 (!) 21 (!) 25   Temp:    99.6 F (37.6 C)  TempSrc:      SpO2: 97% 95% 97%   Weight:      Height:          Constitutional: NAD, calm, comfortable Eyes: PERRL, lids and conjunctivae normal ENMT: Mucous membranes are moist. Posterior pharynx clear of any exudate or lesions.Normal dentition.  Neck: normal, supple, no masses, no thyromegaly Respiratory: clear to auscultation bilaterally, no wheezing, no crackles. Normal respiratory effort. No accessory muscle use.  Cardiovascular: Regular rate and rhythm, no murmurs / rubs / gallops.  1+ lower extremity edema. 2+ pedal pulses.  Abdomen: no tenderness, no masses palpated. Bowel sounds positive.  Musculoskeletal: no clubbing / cyanosis. Normal muscle tone.  Skin: Chronic venous stasis changes bilateral lower extremities Neurologic: CN 2-12 grossly intact. Strength 5/5 in all 4.  Psychiatric: Normal judgment and insight. Alert and oriented x 3. Normal mood.   Labs on Admission: I have personally reviewed following labs and imaging studies  CBC:  Recent Labs Lab 01/17/17 0843 01/19/17 0430  WBC 7.5 10.0  NEUTROABS 4.8 8.2*  HGB 13.9  13.0  HCT 41.2 38.5  MCV 95.9 95.1  PLT 198 557   Basic Metabolic Panel:  Recent Labs Lab 01/17/17 0843 01/19/17 0550  NA 137 138  K 4.4 4.2  CL  --  107  CO2 21* 24  GLUCOSE 353* 269*  BUN 20.7 26*  CREATININE 1.1 1.05*  CALCIUM 9.8 8.4*   GFR: Estimated Creatinine Clearance: 57.7 mL/min (A) (by C-G formula based on SCr of 1.05 mg/dL (H)). Liver Function Tests:  Recent Labs Lab 01/17/17 0843 01/19/17 0550  AST 19 30  ALT 18 20  ALKPHOS 91 59  BILITOT 0.31 0.2*  PROT 6.9 5.9*  ALBUMIN 2.8* 2.5*   No results for input(s): LIPASE, AMYLASE in the last 168 hours. No results for input(s): AMMONIA in the last 168 hours. Coagulation Profile: No results for input(s): INR, PROTIME in the last 168 hours. Cardiac Enzymes: No results for input(s): CKTOTAL, CKMB, CKMBINDEX, TROPONINI in the last 168 hours. BNP (last 3 results) No results for input(s): PROBNP in the last 8760 hours. HbA1C: No results for input(s): HGBA1C in the last 72 hours. CBG: No results for input(s): GLUCAP in the last 168 hours. Lipid Profile: No results for input(s): CHOL, HDL, LDLCALC, TRIG, CHOLHDL, LDLDIRECT in the last 72 hours. Thyroid Function Tests: No results for input(s): TSH, T4TOTAL, FREET4, T3FREE, THYROIDAB in the last 72 hours. Anemia Panel: No results for input(s): VITAMINB12, FOLATE, FERRITIN, TIBC, IRON, RETICCTPCT in the last 72 hours. Urine analysis:    Component Value Date/Time   COLORURINE YELLOW 01/19/2017 0640   APPEARANCEUR HAZY (A) 01/19/2017 0640   LABSPEC 1.017 01/19/2017 0640   PHURINE 5.0 01/19/2017 0640   GLUCOSEU NEGATIVE 01/19/2017 0640   HGBUR NEGATIVE 01/19/2017 0640   BILIRUBINUR NEGATIVE 01/19/2017 0640   BILIRUBINUR neg 09/09/2015 1228   KETONESUR NEGATIVE 01/19/2017 0640   PROTEINUR NEGATIVE 01/19/2017 0640   UROBILINOGEN 0.2 09/09/2015 1228   UROBILINOGEN 0.2 12/11/2010 1434   NITRITE NEGATIVE 01/19/2017 0640   LEUKOCYTESUR TRACE (A) 01/19/2017 0640       Radiological Exams on Admission: Dg Chest Port 1 View  Result Date: 01/19/2017 CLINICAL DATA:  Fever and sepsis EXAM: PORTABLE CHEST 1 VIEW COMPARISON:  11/15/2010 FINDINGS: Central and basilar right lung opacities. Left lung is clear. No large effusions. Normal pulmonary vasculature. IMPRESSION: Patchy opacities in the central and basilar right lung. This could represent early pneumonia. Followup PA and lateral chest X-ray is recommended in 3-4 weeks following trial of antibiotic therapy to ensure resolution and exclude underlying malignancy. Electronically Signed   By: Andreas Newport M.D.   On: 01/19/2017 04:59    EKG: Independently reviewed. Sinus rhythm   Assessment/Plan Active Problems:   HYPERCHOLESTEROLEMIA   Essential hypertension   History of deep venous thrombosis   Dependent edema   Elevated liver enzymes   Sepsis ([place])   Aspiration pneumonia (H[facility])   Concern for sepsis due to pneumonia -Patient with an emesis episode followed by fever, likely aspiration pneumonitis, she has no symptoms characteristic for pneumonia -Given relative immunosuppression in the setting of rituximab, agree with vancomycin and Zosyn as started in the emergency room and will continue for now -Blood cultures obtained in the ED, follow results -Patient initially refusing admission however she agrees to stay for now but asking to leave in 24 hours.  Discussed with her, if she is afebrile and feels well that may be a possibility -Lactic acid normalized after initial fluid resuscitation  Diabetes mellitus -Resume Lantus at a lower dose, place on sliding scale  Pemphigus foliaceous -Outpatient management  Hyperlipidemia -Resume statin  History of DVT -Already on Pradaxa, continue   DVT prophylaxis: Pradaxa Code Status: Full code Family Communication: Discussed with sister at bedside Disposition Plan: Admit to telemetry Consults called: None    Admission status: Observation   At  the point of initial evaluation, it is my clinical opinion that admission for OBSERVATION is reasonable and necessary because the patient's presenting complaints in the context of their chronic conditions represent sufficient risk of deterioration or significant morbidity to constitute reasonable grounds for close observation in the hospital setting, but that the patient may be medically stable for discharge from the hospital within 24 to 48 hours.   Marzetta Board, MD [facility] Pager [phone]  If 7PM-7AM, please contact night-coverage [email or web address] Password [email or web address]  01/19/2017, 7:46 AM

## 2017-01-19 NOTE — ED Notes (Signed)
Lab called and urine is still being processed

## 2017-01-20 DIAGNOSIS — N179 Acute kidney failure, unspecified: Secondary | ICD-10-CM | POA: Diagnosis not present

## 2017-01-20 DIAGNOSIS — L102 Pemphigus foliaceous: Secondary | ICD-10-CM | POA: Diagnosis not present

## 2017-01-20 DIAGNOSIS — E119 Type 2 diabetes mellitus without complications: Secondary | ICD-10-CM | POA: Diagnosis not present

## 2017-01-20 DIAGNOSIS — N390 Urinary tract infection, site not specified: Secondary | ICD-10-CM | POA: Diagnosis not present

## 2017-01-20 DIAGNOSIS — J690 Pneumonitis due to inhalation of food and vomit: Secondary | ICD-10-CM | POA: Diagnosis not present

## 2017-01-20 DIAGNOSIS — D899 Disorder involving the immune mechanism, unspecified: Secondary | ICD-10-CM

## 2017-01-20 DIAGNOSIS — A419 Sepsis, unspecified organism: Secondary | ICD-10-CM | POA: Diagnosis not present

## 2017-01-20 DIAGNOSIS — Z86718 Personal history of other venous thrombosis and embolism: Secondary | ICD-10-CM | POA: Diagnosis not present

## 2017-01-20 DIAGNOSIS — Z794 Long term (current) use of insulin: Secondary | ICD-10-CM | POA: Diagnosis not present

## 2017-01-20 LAB — CBC
HCT: 37.9 % (ref 36.0–46.0)
Hemoglobin: 12.5 g/dL (ref 12.0–15.0)
MCH: 31.4 pg (ref 26.0–34.0)
MCHC: 33 g/dL (ref 30.0–36.0)
MCV: 95.2 fL (ref 78.0–100.0)
PLATELETS: 167 10*3/uL (ref 150–400)
RBC: 3.98 MIL/uL (ref 3.87–5.11)
RDW: 13 % (ref 11.5–15.5)
WBC: 8.7 10*3/uL (ref 4.0–10.5)

## 2017-01-20 LAB — BASIC METABOLIC PANEL
Anion gap: 5 (ref 5–15)
BUN: 17 mg/dL (ref 6–20)
CHLORIDE: 107 mmol/L (ref 101–111)
CO2: 25 mmol/L (ref 22–32)
Calcium: 7.9 mg/dL — ABNORMAL LOW (ref 8.9–10.3)
Creatinine, Ser: 0.89 mg/dL (ref 0.44–1.00)
GFR calc non Af Amer: >60 mL/min (ref >60)
Glucose, Bld: 166 mg/dL — ABNORMAL HIGH (ref 65–99)
POTASSIUM: 4.2 mmol/L (ref 3.5–5.1)
SODIUM: 137 mmol/L (ref 135–145)

## 2017-01-20 LAB — BLOOD CULTURE ID PANEL (REFLEXED)
ACINETOBACTER BAUMANNII: NOT DETECTED
CANDIDA ALBICANS: NOT DETECTED
CANDIDA GLABRATA: NOT DETECTED
CANDIDA PARAPSILOSIS: NOT DETECTED
CANDIDA TROPICALIS: NOT DETECTED
Candida krusei: NOT DETECTED
ENTEROBACTER CLOACAE COMPLEX: NOT DETECTED
ENTEROBACTERIACEAE SPECIES: NOT DETECTED
Enterococcus species: NOT DETECTED
Escherichia coli: NOT DETECTED
HAEMOPHILUS INFLUENZAE: NOT DETECTED
KLEBSIELLA OXYTOCA: NOT DETECTED
KLEBSIELLA PNEUMONIAE: NOT DETECTED
Listeria monocytogenes: NOT DETECTED
Methicillin resistance: NOT DETECTED
NEISSERIA MENINGITIDIS: NOT DETECTED
PSEUDOMONAS AERUGINOSA: NOT DETECTED
Proteus species: NOT DETECTED
STAPHYLOCOCCUS AUREUS BCID: NOT DETECTED
STAPHYLOCOCCUS SPECIES: DETECTED — AB
STREPTOCOCCUS AGALACTIAE: NOT DETECTED
Serratia marcescens: NOT DETECTED
Streptococcus pneumoniae: NOT DETECTED
Streptococcus pyogenes: NOT DETECTED
Streptococcus species: NOT DETECTED

## 2017-01-20 LAB — GLUCOSE, CAPILLARY
GLUCOSE-CAPILLARY: 212 mg/dL — AB (ref 65–99)
GLUCOSE-CAPILLARY: 225 mg/dL — AB (ref 65–99)
Glucose-Capillary: 145 mg/dL — ABNORMAL HIGH (ref 65–99)
Glucose-Capillary: 179 mg/dL — ABNORMAL HIGH (ref 65–99)

## 2017-01-20 MED ORDER — FLUCONAZOLE 100 MG PO TABS
100.0000 mg | ORAL_TABLET | Freq: Every day | ORAL | Status: DC
  Administered 2017-01-21 – 2017-01-22 (×2): 100 mg via ORAL
  Filled 2017-01-20 (×2): qty 1

## 2017-01-20 MED ORDER — FLUCONAZOLE 100 MG PO TABS
200.0000 mg | ORAL_TABLET | Freq: Once | ORAL | Status: AC
Start: 2017-01-20 — End: 2017-01-20
  Administered 2017-01-20: 200 mg via ORAL
  Filled 2017-01-20: qty 2

## 2017-01-20 NOTE — Evaluation (Signed)
Physical Therapy Evaluation Patient Details Name: Julie Mora MRN: [id number] DOB: 16-Sep-1941 Today's Date: 01/20/2017   History of Present Illness  76  yo female brought toED febrile , tachycardic , her blood pressure and saturations stable .  Chest x-ray showed patchy opacities in the central and basilar right lung, concerning for early pneumonia.  She was treated for sepsis,  Clinical Impression  The patient is much weaker than would be able to care for self at home. Discussed short term rehab. Patient May be agreeable. Pt admitted with above diagnosis. Pt currently with functional limitations due to the deficits listed below (see PT Problem List).  Pt will benefit from skilled PT to increase their independence and safety with mobility to allow discharge to the venue listed below.       Follow Up Recommendations SNF;Supervision/Assistance - 24 hour    Equipment Recommendations  None recommended by PT    Recommendations for Other Services       Precautions / Restrictions Precautions Precautions: Fall Precaution Comments: skin lesions      Mobility  Bed Mobility Overal bed mobility: Needs Assistance Bed Mobility: Supine to Sit     Supine to sit: Min assist     General bed mobility comments: assist with scooting to edge using pad.   Transfers Overall transfer level: Needs assistance Equipment used: Rolling walker (2 wheeled) Transfers: Sit to/from Stand Sit to Stand: Mod assist         General transfer comment: Steady assist to rise from bed and BSC, extra time. Cues for safety, hand position  Ambulation/Gait Ambulation/Gait assistance: Min assist Ambulation Distance (Feet): 12 Feet (x 2) Assistive device: Rolling walker (2 wheeled) Gait Pattern/deviations: Step-to pattern;Wide base of support;Trunk flexed;Step-through pattern;Decreased stride length     General Gait Details: very slow speed, trunk leaning forward over Rw. multimodal cues for safety as patient  lets go of RW too soon, does not completely turn to surface before sitting down.  Stairs            Wheelchair Mobility    Modified Rankin (Stroke Patients Only)       Balance                                             Pertinent Vitals/Pain Pain Assessment: No/denies pain    Home Living Family/patient expects to be discharged to:: Private residence Living Arrangements: Alone Available Help at Discharge: Family;Available 24 hours/day;Neighbor Type of Home: House Home Access: Ramped entrance     Home Layout: One level Home Equipment: [place] - 2 wheels;Toilet riser      Prior Function Level of Independence: Needs assistance   Gait / Transfers Assistance Needed: Modified independent with Rw in g=house. has friend who assists with housekeeping/chores, sister assists with griceries and appts.           Hand Dominance        Extremity/Trunk Assessment   Upper Extremity Assessment Upper Extremity Assessment: Generalized weakness    Lower Extremity Assessment Lower Extremity Assessment: Generalized weakness    Cervical / Trunk Assessment Cervical / Trunk Assessment: Other exceptions Cervical / Trunk Exceptions: leans forward  Communication      Cognition Arousal/Alertness: Awake/alert Behavior During Therapy: WFL for tasks assessed/performed Overall Cognitive Status: Within Functional Limits for tasks assessed  General Comments      Exercises     Assessment/Plan    PT Assessment Patient needs continued PT services  PT Problem List Decreased strength;Decreased activity tolerance;Decreased mobility;Decreased safety awareness;Decreased knowledge of use of DME       PT Treatment Interventions DME instruction;Gait training;Functional mobility training;Therapeutic activities;Patient/family education    PT Goals (Current goals can be found in the Care Plan section)  Acute  Rehab PT Goals Patient Stated Goal: to go home PT Goal Formulation: With patient Time For Goal Achievement: 02/03/17 Potential to Achieve Goals: Good    Frequency Min 3X/week   Barriers to discharge Decreased caregiver support      Co-evaluation               AM-PAC PT "6 Clicks" Daily Activity  Outcome Measure Difficulty turning over in bed (including adjusting bedclothes, sheets and blankets)?: A Lot Difficulty moving from lying on back to sitting on the side of the bed? : A Lot Difficulty sitting down on and standing up from a chair with arms (e.g., wheelchair, bedside commode, etc,.)?: A Lot Help needed moving to and from a bed to chair (including a wheelchair)?: A Little Help needed walking in hospital room?: A Little Help needed climbing 3-5 steps with a railing? : A Lot 6 Click Score: 14    End of Session   Activity Tolerance: Patient limited by fatigue Patient left: in chair;with call bell/phone within reach;with chair alarm set Nurse Communication: Mobility status PT Visit Diagnosis: Unsteadiness on feet (R26.81);Difficulty in walking, not elsewhere classified (R26.2)    Time: 0825-0907 PT Time Calculation (min) (ACUTE ONLY): 42 min   Charges:   PT Evaluation $PT Eval Low Complexity: 1 Procedure PT Treatments $Gait Training: 8-22 mins $Self Care/Home Management: 8-22   PT G Codes:   PT G-Codes **NOT FOR INPATIENT CLASS** Functional Assessment Tool Used: Clinical judgement;AM-PAC 6 Clicks Basic Mobility Functional Limitation: Mobility: Walking and moving around Mobility: Walking and Moving Around Current Status (N7972): At least 40 percent but less than 60 percent impaired, limited or restricted Mobility: Walking and Moving Around Goal Status [id number]): At least 1 percent but less than 20 percent impaired, limited or restricted    Tresa Endo PT [phone] }  Claretha Tallarico 01/20/2017, 1:23 PM

## 2017-01-20 NOTE — Progress Notes (Signed)
PHARMACY - PHYSICIAN COMMUNICATION CRITICAL VALUE ALERT - BLOOD CULTURE IDENTIFICATION (BCID)  Results for orders placed or performed during the hospital encounter of 01/19/17  Blood Culture ID Panel (Reflexed) (Collected: 01/19/2017  4:30 AM)  Result Value Ref Range   Enterococcus species NOT DETECTED NOT DETECTED   Listeria monocytogenes NOT DETECTED NOT DETECTED   Staphylococcus species DETECTED (A) NOT DETECTED   Staphylococcus aureus NOT DETECTED NOT DETECTED   Methicillin resistance NOT DETECTED NOT DETECTED   Streptococcus species NOT DETECTED NOT DETECTED   Streptococcus agalactiae NOT DETECTED NOT DETECTED   Streptococcus pneumoniae NOT DETECTED NOT DETECTED   Streptococcus pyogenes NOT DETECTED NOT DETECTED   Acinetobacter baumannii NOT DETECTED NOT DETECTED   Enterobacteriaceae species NOT DETECTED NOT DETECTED   Enterobacter cloacae complex NOT DETECTED NOT DETECTED   Escherichia coli NOT DETECTED NOT DETECTED   Klebsiella oxytoca NOT DETECTED NOT DETECTED   Klebsiella pneumoniae NOT DETECTED NOT DETECTED   Proteus species NOT DETECTED NOT DETECTED   Serratia marcescens NOT DETECTED NOT DETECTED   Haemophilus influenzae NOT DETECTED NOT DETECTED   Neisseria meningitidis NOT DETECTED NOT DETECTED   Pseudomonas aeruginosa NOT DETECTED NOT DETECTED   Candida albicans NOT DETECTED NOT DETECTED   Candida glabrata NOT DETECTED NOT DETECTED   Candida krusei NOT DETECTED NOT DETECTED   Candida parapsilosis NOT DETECTED NOT DETECTED   Candida tropicalis NOT DETECTED NOT DETECTED    Name of physician (or Provider) Contacted: None. Discussed with Arlyn Dunning.   Changes to prescribed antibiotics required: Continue broad-coverage sepsis/suspected pneumonia.  Romeo Rabon, PharmD, pager [phone]. 01/20/2017,12:13 PM.

## 2017-01-20 NOTE — Care Management Note (Signed)
[place] NOTIFICATION   Patient Details  Name: Julie Mora MRN: [id number] Date of Birth: Apr 28, 1941   Medicare Observation Status Notification Given:   yes    Norina Buzzard, RN 01/20/2017, 12:50 PM

## 2017-01-20 NOTE — Progress Notes (Signed)
PROGRESS NOTE  Julie Mora YOV:[id number] DOB: 1940/11/17 DOA: 01/19/2017 PCP: Elby Showers, MD  HPI/Recap of past 24 hours:  Feeling better, sitting in chair, denies chest pain, no sob, no cough, fever subsided some lower extremity edema  Assessment/Plan: Active Problems:   HYPERCHOLESTEROLEMIA   Essential hypertension   History of deep venous thrombosis   Dependent edema   Elevated liver enzymes   Sepsis ([place])   Aspiration pneumonia ([place])   Concern for sepsis due to pneumonia in an immunocompromized patient ( received rituximab on 5/2 ) -She presented with fever 102.5, sinus tachycardia, hear rate 114, tachypnea rr 22-28, lactic acidosis -Patient with an emesis episode followed by fever, likely aspiration pneumonitis (she reported n/v at home), she has no symptoms characteristic for pneumonia, she denies cough, no hypoxia, no chest pain,  -cxr mild infiltrate right lower lung? ua rare bacteria, trace leuk, urine culture pending, blood culture 1/2+ gPC in clusters, contamination? mrsa screen negative, - continue vancomycin and Zosyn as started in the emergency room  for now --Patient initially refusing admission however she agrees to stay, now may be open to snf placement. . 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, report skin looks better after just one shot  -Skin care -Outpatient management  Yeasty skin changes under bilateral breast, under abdominal pannus: topical nystatin, interdry, oral diflucan  Chronic venous stasis changes, pitting edema bilateral lower extremity,( report chronic lymphodema)  home meds demadex held today, d/c ivf, consider resume demadex in am Elevate legs  Hyperlipidemia -Resume statin  History of DVT -Already on Pradaxa, continue  FTT: PT recommend SNF, social worker consutled    DVT prophylaxis: Pradaxa Code Status:  Full code Family Communication: patient Disposition Plan: SNF   Consultants:  none  Procedures:  none  Antibiotics:  vanc/zosyn from admission   Objective: BP 134/82 (BP Location: Right Arm)   Pulse (!) 113   Temp 100 F (37.8 C) (Oral)   Resp 20   Ht 5\' 4"  (1.626 m)   Wt 114.8 kg (253 lb)   LMP  (LMP Unknown)   SpO2 90%   BMI 43.43 kg/m   Intake/Output Summary (Last 24 hours) at 01/20/17 1112 Last data filed at 01/20/17 0500  Gross per 24 hour  Intake          1836.25 ml  Output                0 ml  Net          1836.25 ml   Filed Weights   01/19/17 0428 01/19/17 0859  Weight: 112 kg (247 lb) 114.8 kg (253 lb)    Exam:   General:  NAD, pleasant  Cardiovascular: RRR  Respiratory: CTABL  Abdomen: Soft/ND/NT, positive BS  Musculoskeletal: chronic venous stasis changes, pitting  Edema bilateral lower extremity  Skin: yeasty skin changes bilateral groin, under abdominal pannus, under bilateral breasts, other mild erythmatous macules ( report has much improved after she received treatment with ritaximab)  Neuro: aaox3  Data Reviewed: Basic Metabolic Panel:  Recent Labs Lab 01/17/17 0843 01/19/17 0550 01/20/17 0549  NA 137 138 137  K 4.4 4.2 4.2  CL  --  107 107  CO2 21* 24 25  GLUCOSE 353* 269* 166*  BUN 20.7 26* 17  CREATININE 1.1 1.05* 0.89  CALCIUM 9.8 8.4* 7.9*   Liver Function Tests:  Recent Labs Lab  01/17/17 0843 01/19/17 0550  AST 19 30  ALT 18 20  ALKPHOS 91 59  BILITOT 0.31 0.2*  PROT 6.9 5.9*  ALBUMIN 2.8* 2.5*   No results for input(s): LIPASE, AMYLASE in the last 168 hours. No results for input(s): AMMONIA in the last 168 hours. CBC:  Recent Labs Lab 01/17/17 0843 01/19/17 0430 01/20/17 0549  WBC 7.5 10.0 8.7  NEUTROABS 4.8 8.2*  --   HGB 13.9 13.0 12.5  HCT 41.2 38.5 37.9  MCV 95.9 95.1 95.2  PLT 198 188 167   Cardiac Enzymes:   No results for input(s): CKTOTAL, CKMB, CKMBINDEX, TROPONINI in the last  168 hours. BNP (last 3 results) No results for input(s): BNP in the last 8760 hours.  ProBNP (last 3 results) No results for input(s): PROBNP in the last 8760 hours.  CBG:  Recent Labs Lab 01/19/17 0932 01/19/17 1207 01/19/17 1715 01/19/17 2204 01/20/17 0753  GLUCAP 221* 269* 258* 172* 145*    Recent Results (from the past 240 hour(s))  Blood Culture (routine x 2)     Status: None (Preliminary result)   Collection Time: 01/19/17  4:30 AM  Result Value Ref Range Status   Specimen Description BLOOD RIGHT HAND  Final   Special Requests   Final    BOTTLES DRAWN AEROBIC AND ANAEROBIC Blood Culture adequate volume   Culture  Setup Time   Final    GRAM POSITIVE COCCI IN CLUSTERS AEROBIC BOTTLE ONLY Organism ID to follow Performed at [facility], [place] [address] [place], [place] [address]    Culture GRAM POSITIVE COCCI  Final   Report Status PENDING  Incomplete  Urine culture     Status: Abnormal (Preliminary result)   Collection Time: 01/19/17  6:40 AM  Result Value Ref Range Status   Specimen Description URINE, CATHETERIZED  Final   Special Requests NONE  Final   Culture (A)  Final    >=100,000 COLONIES/mL UNIDENTIFIED ORGANISM IDENTIFICATION AND SUSCEPTIBILITIES TO FOLLOW Performed at [facility], [place] [address], [place],  [address]    Report Status PENDING  Incomplete  MRSA PCR Screening     Status: None   Collection Time: 01/19/17  2:09 PM  Result Value Ref Range Status   MRSA by PCR NEGATIVE NEGATIVE Final    Comment:        The GeneXpert MRSA Assay (FDA approved for NASAL specimens only), is one component of a comprehensive MRSA colonization surveillance program. It is not intended to diagnose MRSA infection nor to guide or monitor treatment for MRSA infections.      Studies: No results found.  Scheduled Meds: . dabigatran  75 mg Oral Q12H  . ferrous gluconate  324 mg Oral BID  . [START ON 01/21/2017] fluconazole  100 mg Oral Daily   . fluconazole  200 mg Oral Once  . fludrocortisone  200 mcg Oral BID  . gabapentin  300 mg Oral QHS  . insulin aspart  0-5 Units Subcutaneous QHS  . insulin aspart  0-9 Units Subcutaneous TID WC  . insulin glargine  30 Units Subcutaneous QHS  . prednisoLONE acetate  1 drop Both Eyes BID  . simvastatin  20 mg Oral QHS    Continuous Infusions: . piperacillin-tazobactam (ZOSYN)  IV Stopped (01/20/17 0855)  . sodium chloride    . vancomycin Stopped (01/20/17 6314)     Time spent: 74mins  Terrionna Bridwell MD, PhD  [facility] Pager [phone]. If 7PM-7AM, please contact night-coverage  at [email or web address], password [facility] 01/20/2017, 11:12 AM  LOS: 1 day

## 2017-01-21 DIAGNOSIS — Z86718 Personal history of other venous thrombosis and embolism: Secondary | ICD-10-CM | POA: Diagnosis not present

## 2017-01-21 DIAGNOSIS — N179 Acute kidney failure, unspecified: Secondary | ICD-10-CM | POA: Diagnosis present

## 2017-01-21 DIAGNOSIS — N390 Urinary tract infection, site not specified: Secondary | ICD-10-CM | POA: Diagnosis present

## 2017-01-21 DIAGNOSIS — E119 Type 2 diabetes mellitus without complications: Secondary | ICD-10-CM | POA: Diagnosis present

## 2017-01-21 DIAGNOSIS — R627 Adult failure to thrive: Secondary | ICD-10-CM | POA: Diagnosis present

## 2017-01-21 DIAGNOSIS — Z6841 Body Mass Index (BMI) 40.0 and over, adult: Secondary | ICD-10-CM | POA: Diagnosis not present

## 2017-01-21 DIAGNOSIS — Z888 Allergy status to other drugs, medicaments and biological substances status: Secondary | ICD-10-CM | POA: Diagnosis not present

## 2017-01-21 DIAGNOSIS — Z79899 Other long term (current) drug therapy: Secondary | ICD-10-CM | POA: Diagnosis not present

## 2017-01-21 DIAGNOSIS — Z794 Long term (current) use of insulin: Secondary | ICD-10-CM | POA: Diagnosis not present

## 2017-01-21 DIAGNOSIS — R000 Tachycardia, unspecified: Secondary | ICD-10-CM | POA: Diagnosis present

## 2017-01-21 DIAGNOSIS — E559 Vitamin D deficiency, unspecified: Secondary | ICD-10-CM | POA: Diagnosis present

## 2017-01-21 DIAGNOSIS — L109 Pemphigus, unspecified: Secondary | ICD-10-CM | POA: Diagnosis present

## 2017-01-21 DIAGNOSIS — A419 Sepsis, unspecified organism: Secondary | ICD-10-CM | POA: Diagnosis present

## 2017-01-21 DIAGNOSIS — E872 Acidosis: Secondary | ICD-10-CM | POA: Diagnosis present

## 2017-01-21 DIAGNOSIS — B952 Enterococcus as the cause of diseases classified elsewhere: Secondary | ICD-10-CM | POA: Diagnosis present

## 2017-01-21 DIAGNOSIS — Z7983 Long term (current) use of bisphosphonates: Secondary | ICD-10-CM | POA: Diagnosis not present

## 2017-01-21 DIAGNOSIS — E785 Hyperlipidemia, unspecified: Secondary | ICD-10-CM | POA: Diagnosis present

## 2017-01-21 DIAGNOSIS — R609 Edema, unspecified: Secondary | ICD-10-CM | POA: Diagnosis present

## 2017-01-21 DIAGNOSIS — I10 Essential (primary) hypertension: Secondary | ICD-10-CM | POA: Diagnosis present

## 2017-01-21 DIAGNOSIS — Z7901 Long term (current) use of anticoagulants: Secondary | ICD-10-CM | POA: Diagnosis not present

## 2017-01-21 DIAGNOSIS — E7800 Pure hypercholesterolemia, unspecified: Secondary | ICD-10-CM | POA: Diagnosis present

## 2017-01-21 DIAGNOSIS — R748 Abnormal levels of other serum enzymes: Secondary | ICD-10-CM | POA: Diagnosis present

## 2017-01-21 DIAGNOSIS — J690 Pneumonitis due to inhalation of food and vomit: Secondary | ICD-10-CM | POA: Diagnosis present

## 2017-01-21 LAB — CBC WITH DIFFERENTIAL/PLATELET
Basophils Absolute: 0 10*3/uL (ref 0.0–0.1)
Basophils Relative: 0 %
Eosinophils Absolute: 0.4 10*3/uL (ref 0.0–0.7)
Eosinophils Relative: 7 %
HEMATOCRIT: 37.2 % (ref 36.0–46.0)
HEMOGLOBIN: 11.7 g/dL — AB (ref 12.0–15.0)
LYMPHS ABS: 1.4 10*3/uL (ref 0.7–4.0)
Lymphocytes Relative: 23 %
MCH: 31 pg (ref 26.0–34.0)
MCHC: 31.5 g/dL (ref 30.0–36.0)
MCV: 98.7 fL (ref 78.0–100.0)
MONO ABS: 0.6 10*3/uL (ref 0.1–1.0)
MONOS PCT: 10 %
NEUTROS ABS: 3.7 10*3/uL (ref 1.7–7.7)
NEUTROS PCT: 60 %
PLATELETS: 167 10*3/uL (ref 150–400)
RBC: 3.77 MIL/uL — ABNORMAL LOW (ref 3.87–5.11)
RDW: 12.8 % (ref 11.5–15.5)
WBC: 6.2 10*3/uL (ref 4.0–10.5)

## 2017-01-21 LAB — URINE CULTURE

## 2017-01-21 LAB — BASIC METABOLIC PANEL
ANION GAP: 4 — AB (ref 5–15)
BUN: 17 mg/dL (ref 6–20)
CO2: 27 mmol/L (ref 22–32)
Calcium: 8 mg/dL — ABNORMAL LOW (ref 8.9–10.3)
Chloride: 111 mmol/L (ref 101–111)
Creatinine, Ser: 0.86 mg/dL (ref 0.44–1.00)
GFR calc Af Amer: >60 mL/min (ref >60)
GFR calc non Af Amer: >60 mL/min (ref >60)
GLUCOSE: 134 mg/dL — AB (ref 65–99)
POTASSIUM: 3.7 mmol/L (ref 3.5–5.1)
Sodium: 142 mmol/L (ref 135–145)

## 2017-01-21 LAB — GLUCOSE, CAPILLARY
GLUCOSE-CAPILLARY: 262 mg/dL — AB (ref 65–99)
Glucose-Capillary: 131 mg/dL — ABNORMAL HIGH (ref 65–99)
Glucose-Capillary: 205 mg/dL — ABNORMAL HIGH (ref 65–99)
Glucose-Capillary: 233 mg/dL — ABNORMAL HIGH (ref 65–99)

## 2017-01-21 LAB — MAGNESIUM: Magnesium: 1.7 mg/dL (ref 1.7–2.4)

## 2017-01-21 MED ORDER — TORSEMIDE 20 MG PO TABS
60.0000 mg | ORAL_TABLET | Freq: Every day | ORAL | Status: DC
  Administered 2017-01-21 – 2017-01-22 (×2): 60 mg via ORAL
  Filled 2017-01-21 (×2): qty 3

## 2017-01-21 NOTE — NC FL2 (Signed)
[place] LEVEL OF CARE SCREENING TOOL     IDENTIFICATION  Patient Name: Julie Mora Birthdate: 06-01-1941 Sex: female Admission Date (Current Location): 01/19/2017  [facility] and [place] Number:  Herbalist and Address:  [facility],  [place] [address], [place]      Provider Number: [id number]  Attending Physician Name and Address:  Florencia Reasons, MD  Relative Name and Phone Number:       Current Level of Care: Hospital Recommended Level of Care: [place] Prior Approval Number:    Date Approved/Denied: 01/21/17 PASRR Number: [id number]  Discharge Plan: SNF    Current Diagnoses: Patient Active Problem List   Diagnosis Date Noted  . Sepsis ([place]) 01/19/2017  . Aspiration pneumonia ([place]) 01/19/2017  . Osteoporosis 12/15/2014  . Elevated liver enzymes 11/17/2012  . Metabolic syndrome 09/40/7680  . Chronic use of steroids 04/19/2011  . History of deep venous thrombosis 04/19/2011  . Vitamin D deficiency 04/19/2011  . History of iron deficiency 04/19/2011  . Dependent edema 04/19/2011  . Mixed stress and urge urinary incontinence 04/19/2011  . Urinary tract bacterial infections 04/19/2011  . CEREBROVASCULAR ACCIDENT, ACUTE 06/25/2009  . PEMPHIGUS FOLIACEOUS 06/25/2009  . Diabetes mellitus ([place]) 05/28/2009  . HYPERCHOLESTEROLEMIA 05/28/2009  . Essential hypertension 05/28/2009    Orientation RESPIRATION BLADDER Height & Weight     Self, Time, Situation, Place  Normal Continent Weight: 253 lb (114.8 kg) Height:  5\' 4"  (162.6 cm)  BEHAVIORAL SYMPTOMS/MOOD NEUROLOGICAL BOWEL NUTRITION STATUS      Incontinent Diet (See DC Summary)  AMBULATORY STATUS COMMUNICATION OF NEEDS Skin   Limited Assist Verbally PU Stage and Appropriate Care (shallow open ulcer with moisture barrier)                       Personal Care Assistance Level of Assistance  Bathing, Feeding, Dressing Bathing Assistance: Limited  assistance Feeding assistance: Independent Dressing Assistance: Limited assistance     Functional Limitations Info  Sight, Hearing, Speech Sight Info: Adequate Hearing Info: Adequate Speech Info: Adequate    SPECIAL CARE FACTORS FREQUENCY  PT (By licensed PT), OT (By licensed OT)     PT Frequency: 5xweek OT Frequency: 5xweek            Contractures      Additional Factors Info  Code Status, Allergies, Isolation Precautions, Insulin Sliding Scale Code Status Info: Full Allergies Info: AZATHIOPRINE, CELLCEPT MYCOPHENOLATE MOFETIL, RAMIPRIL   Insulin Sliding Scale Info: 9units 3x's a day;5 units at bed; 9u 3x's a day Isolation Precautions Info: MRSA     Current Medications (01/21/2017):  This is the current hospital active medication list Current Facility-Administered Medications  Medication Dose Route Frequency Provider Last Rate Last Dose  . acetaminophen (TYLENOL) tablet 650 mg  650 mg Oral Q6H PRN Caren Griffins, MD   650 mg at 01/19/17 1527  . dabigatran (PRADAXA) capsule 75 mg  75 mg Oral Q12H Caren Griffins, MD   75 mg at 01/21/17 0948  . ferrous gluconate (FERGON) tablet 324 mg  324 mg Oral BID Caren Griffins, MD   324 mg at 01/21/17 0949  . fluconazole (DIFLUCAN) tablet 100 mg  100 mg Oral Daily Florencia Reasons, MD   100 mg at 01/21/17 0948  . fludrocortisone (FLORINEF) tablet 200 mcg  200 mcg Oral BID Caren Griffins, MD   200 mcg at 01/21/17 0948  . gabapentin (NEURONTIN) capsule 300 mg  300  mg Oral QHS Caren Griffins, MD   300 mg at 01/20/17 2132  . insulin aspart (novoLOG) injection 0-5 Units  0-5 Units Subcutaneous QHS Caren Griffins, MD   2 Units at 01/20/17 2133  . insulin aspart (novoLOG) injection 0-9 Units  0-9 Units Subcutaneous TID WC Caren Griffins, MD   3 Units at 01/21/17 1302  . insulin glargine (LANTUS) injection 30 Units  30 Units Subcutaneous QHS Caren Griffins, MD   30 Units at 01/20/17 2132  . piperacillin-tazobactam (ZOSYN) IVPB  3.375 g  3.375 g Intravenous Q8H Dorrene German, RPH 12.5 mL/hr at 01/21/17 1302 3.375 g at 01/21/17 1302  . prednisoLONE acetate (PRED FORTE) 1 % ophthalmic suspension 1 drop  1 drop Both Eyes BID Caren Griffins, MD   1 drop at 01/21/17 0949  . simvastatin (ZOCOR) tablet 20 mg  20 mg Oral QHS Caren Griffins, MD   20 mg at 01/20/17 2132  . sodium chloride 0.9 % bolus 500 mL  500 mL Intravenous Once Palumbo, April, MD      . torsemide [facility]) tablet 60 mg  60 mg Oral Daily Florencia Reasons, MD   60 mg at 01/21/17 1429     Discharge Medications: Please see discharge summary for a list of discharge medications.  Relevant Imaging Results:  Relevant Lab Results:   Additional Information ZH:[id number]  Normajean Baxter, LCSW

## 2017-01-21 NOTE — Progress Notes (Addendum)
PROGRESS NOTE  Julie Mora VQM:[id number] DOB: 08/28/1941 DOA: 01/19/2017 PCP: Elby Showers, MD  HPI/Recap of past 24 hours:  Feeling better, sitting in chair, denies chest pain, no sob, no cough, fever subsided some lower extremity edema  Assessment/Plan: Active Problems:   HYPERCHOLESTEROLEMIA   Essential hypertension   History of deep venous thrombosis   Dependent edema   Elevated liver enzymes   Sepsis ([place])   Aspiration pneumonia ([place])   Concern for sepsis due to pneumonia in an immunocompromized patient ( received rituximab on 5/2, insulin dependent dm2, and phemphigus) -She presented with fever 102.5, sinus tachycardia, hear rate 114, tachypnea rr 22-28, lactic acidosis -Patient with an emesis episode followed by fever, likely aspiration pneumonitis (she reported n/v at home), she has no symptoms characteristic for pneumonia, she denies cough, no hypoxia, no chest pain,  -cxr mild infiltrate right lower lung? ua rare bacteria, trace leuk, urine culture + enterococcus , blood culture 1/2 coag negative staph, likely contamination mrsa screen negative, - d/c  Vancomycin, continue  Zosyn that was started in the emergency room  for now --improving ,fever subsided, lactic acidosis resolved. Expect able to d/c on oral abx on 5/7.  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   FTT: PT recommend SNF, social worker consutled    DVT prophylaxis: Pradaxa Code Status: Full code Family Communication: patient Disposition Plan: SNF vs home health on 5/7   Consultants:  none  Procedures:  none  Antibiotics:  vanc from admission to 5/6  zosyn from admission   Objective: BP (!) 111/59 (BP Location: Right Arm)   Pulse 83   Temp 97.4 F (36.3 C) (Oral)   Resp 18   Ht 5\' 4"  (1.626 m)   Wt 114.8 kg (253 lb)   LMP  (LMP Unknown)   SpO2 93%   BMI 43.43 kg/m   Intake/Output Summary (Last 24 hours) at 01/21/17 1347 Last data filed at 01/21/17 9326  Gross per 24 hour  Intake             1345 ml  Output                0 ml  Net             1345 ml   Filed Weights   01/19/17 0428 01/19/17 0859  Weight: 112 kg (247 lb) 114.8 kg (253 lb)    Exam:   General:  NAD, pleasant  Cardiovascular: RRR  Respiratory: CTABL  Abdomen: Soft/ND/NT, positive BS  Musculoskeletal: chronic venous stasis changes, pitting  Edema bilateral lower extremity  Skin: yeasty skin changes bilateral groin, under abdominal pannus, under bilateral breasts, other mild erythmatous macules ( report has much improved after she received treatment with ritaximab)  Neuro: aaox3  Data Reviewed: Basic Metabolic Panel:  Recent Labs Lab 01/17/17 0843 01/19/17 0550 01/20/17 0549 01/21/17  0557  NA 137 138 137 142  K 4.4 4.2 4.2 3.7  CL  --  107 107 111  CO2 21* 24 25 27   GLUCOSE 353* 269* 166* 134*  BUN 20.7 26* 17 17  CREATININE 1.1 1.05* 0.89 0.86  CALCIUM 9.8 8.4* 7.9* 8.0*  MG  --   --   --  1.7   Liver Function Tests:  Recent Labs Lab 01/17/17 0843 01/19/17 0550  AST 19 30  ALT 18 20  ALKPHOS 91 59  BILITOT 0.31 0.2*  PROT 6.9 5.9*  ALBUMIN 2.8* 2.5*   No results for input(s): LIPASE, AMYLASE in the last 168 hours. No results for input(s): AMMONIA in the last  168 hours. CBC:  Recent Labs Lab 01/17/17 0843 01/19/17 0430 01/20/17 0549 01/21/17 0557  WBC 7.5 10.0 8.7 6.2  NEUTROABS 4.8 8.2*  --  3.7  HGB 13.9 13.0 12.5 11.7*  HCT 41.2 38.5 37.9 37.2  MCV 95.9 95.1 95.2 98.7  PLT 198 188 167 167   Cardiac Enzymes:   No results for input(s): CKTOTAL, CKMB, CKMBINDEX, TROPONINI in the last 168 hours. BNP (last 3 results) No results for input(s): BNP in the last 8760 hours.  ProBNP (last 3 results) No results for input(s): PROBNP in the last 8760 hours.  CBG:  Recent Labs Lab 01/20/17 1117 01/20/17 1646 01/20/17 2058 01/21/17 0740 01/21/17 1157  GLUCAP 179* 212* 225* 131* 205*    Recent Results (from the past 240 hour(s))  Blood Culture (routine x 2)     Status: None (Preliminary result)   Collection Time: 01/19/17  4:30 AM  Result Value Ref Range Status   Specimen Description BLOOD RIGHT ANTECUBITAL  Final   Special Requests IN PEDIATRIC BOTTLE Blood Culture adequate volume  Final   Culture   Final    NO GROWTH 2 DAYS Performed at [facility], [place] [address], [place], [place] [address]    Report Status PENDING  Incomplete  Blood Culture (routine x 2)     Status: Abnormal (Preliminary result)   Collection Time: 01/19/17  4:30 AM  Result Value Ref Range Status   Specimen Description BLOOD RIGHT HAND  Final   Special Requests   Final    BOTTLES DRAWN AEROBIC AND ANAEROBIC Blood Culture adequate volume   Culture  Setup Time   Final    GRAM POSITIVE COCCI IN CLUSTERS AEROBIC BOTTLE ONLY CRITICAL RESULT CALLED TO, READ BACK BY AND VERIFIED WITH: T. PICKERING PHARMD, AT 1206 01/20/17 BY D. VANHOOK    Culture (A)  Final    STAPHYLOCOCCUS SPECIES (COAGULASE NEGATIVE) THE SIGNIFICANCE OF ISOLATING THIS ORGANISM FROM A SINGLE SET OF BLOOD CULTURES WHEN MULTIPLE SETS ARE DRAWN IS UNCERTAIN. PLEASE NOTIFY THE MICROBIOLOGY DEPARTMENT WITHIN ONE WEEK IF SPECIATION AND SENSITIVITIES ARE REQUIRED. Performed at [facility], [place] [address], [place],  [address]    Report Status PENDING  Incomplete  Blood Culture ID Panel (Reflexed)     Status: Abnormal   Collection Time: 01/19/17  4:30 AM  Result Value Ref Range Status   Enterococcus species NOT DETECTED NOT DETECTED Final   Listeria monocytogenes NOT DETECTED NOT DETECTED Final   Staphylococcus species DETECTED (A) NOT DETECTED Final    Comment: Methicillin (oxacillin) susceptible coagulase negative staphylococcus. Possible blood culture contaminant (unless isolated from more than one blood culture draw or clinical case suggests pathogenicity). No antibiotic treatment is indicated for blood  culture contaminants. CRITICAL RESULT CALLED TO,  READ BACK BY AND VERIFIED WITH: T. PICKERING PHARMD, AT 1206 01/20/17 BY D. VANHOOK    Staphylococcus aureus NOT DETECTED NOT DETECTED Final   Methicillin resistance NOT DETECTED NOT DETECTED Final   Streptococcus species NOT DETECTED NOT DETECTED Final   Streptococcus agalactiae NOT DETECTED NOT DETECTED Final   Streptococcus pneumoniae NOT DETECTED NOT DETECTED Final   Streptococcus pyogenes NOT DETECTED NOT DETECTED Final   Acinetobacter baumannii NOT DETECTED NOT DETECTED Final   Enterobacteriaceae species NOT DETECTED NOT DETECTED Final   Enterobacter cloacae complex NOT DETECTED NOT DETECTED Final   Escherichia coli NOT DETECTED NOT DETECTED Final   Klebsiella oxytoca NOT DETECTED NOT DETECTED Final   Klebsiella pneumoniae NOT DETECTED NOT DETECTED Final   Proteus species NOT DETECTED NOT DETECTED Final   Serratia marcescens NOT DETECTED NOT DETECTED Final   Haemophilus influenzae NOT DETECTED NOT DETECTED Final   Neisseria meningitidis NOT DETECTED NOT DETECTED Final   Pseudomonas aeruginosa NOT DETECTED NOT DETECTED Final   Candida albicans NOT DETECTED NOT DETECTED Final   Candida glabrata NOT DETECTED NOT DETECTED Final   Candida krusei NOT DETECTED NOT DETECTED Final   Candida parapsilosis NOT  DETECTED NOT DETECTED Final   Candida tropicalis NOT DETECTED NOT DETECTED Final    Comment: Performed at [facility], [place] [address], [place],  [address]  Urine culture     Status: Abnormal   Collection Time: 01/19/17  6:40 AM  Result Value Ref Range Status   Specimen Description URINE, CATHETERIZED  Final   Special Requests NONE  Final   Culture >=100,000 COLONIES/mL ENTEROCOCCUS FAECALIS (A)  Final   Report Status 01/21/2017 FINAL  Final   Organism ID, Bacteria ENTEROCOCCUS FAECALIS (A)  Final      Susceptibility   Enterococcus faecalis - MIC*    AMPICILLIN <=2 SENSITIVE Sensitive     LEVOFLOXACIN 1 SENSITIVE Sensitive     NITROFURANTOIN <=16 SENSITIVE Sensitive     VANCOMYCIN 1 SENSITIVE Sensitive     * >=100,000 COLONIES/mL ENTEROCOCCUS FAECALIS  MRSA PCR Screening     Status: None   Collection Time: 01/19/17  2:09 PM  Result Value Ref Range Status   MRSA by PCR NEGATIVE NEGATIVE Final    Comment:        The GeneXpert MRSA Assay (FDA approved for NASAL specimens only), is one component of a comprehensive MRSA colonization surveillance program. It is not intended to diagnose MRSA infection nor to guide or monitor treatment for MRSA infections.      Studies: No results found.  Scheduled Meds: . dabigatran  75 mg Oral Q12H  . ferrous gluconate  324 mg Oral BID  . fluconazole  100 mg Oral Daily  . fludrocortisone  200 mcg Oral BID  . gabapentin  300 mg Oral QHS  . insulin aspart  0-5 Units Subcutaneous QHS  . insulin aspart  0-9 Units Subcutaneous TID WC  . insulin glargine  30 Units Subcutaneous QHS  . prednisoLONE acetate  1 drop Both Eyes BID  . simvastatin  20 mg Oral QHS  . torsemide  60 mg Oral Daily    Continuous Infusions: . piperacillin-tazobactam (ZOSYN)  IV 3.375 g (01/21/17 1302)  . sodium chloride       Time spent: 55mins  Bernadett Milian MD, PhD  [facility] Pager [phone]. If 7PM-7AM, please contact night-coverage at  [email or web address], password [facility] 01/21/2017, 1:47 PM  LOS: 1 day

## 2017-01-21 NOTE — Clinical Social Work Note (Signed)
Clinical Social Work Assessment  Patient Details  Name: Julie Mora MRN: [id number] Date of Birth: 1941/08/31  Date of referral:  01/21/17               Reason for consult:  Facility Placement                Permission sought to share information with:  Facility Art therapist granted to share information::  Yes, Verbal Permission Granted  Name::        Agency::  SNF  Relationship::     Contact Information:     Housing/Transportation Living arrangements for the past 2 months:  Single Family Home Source of Information:  Patient Patient Interpreter Needed:  None Criminal Activity/Legal Involvement Pertinent to Current Situation/Hospitalization:  No - Comment as needed Significant Relationships:  Siblings Lives with:  Self Do you feel safe going back to the place where you live?  No Need for family participation in patient care:  Yes (Comment)  Care giving concerns:  Patient resided at home prior to hospitalization. Patient was independent with ADL's prior.  Patient resides alone and is not safe to return home with physical impairment.  Social Worker assessment / plan:  CSW met with patient and sister at bedside to discuss DC recommendations.. CSW explained her role and the process for SNF options and placement.  Patient indicated that she is knowledgeable about SNF's as she has experienced some in the area for prior health needs. CSW provided her a list of [place] facilities and obtained permission to send out offers.  Patient indicated that she would like to meet with a RN case mgr to discuss home health options if she declines a SNF. CSW will f/u.  FL2 completed. Offers sent.  Employment status:  Retired Forensic scientist:  Managed Care PT Recommendations:  [place] / Referral to community resources:  [place]  Patient/Family's Response to care:  Patient and sister pleased that CSW provided information and  assistance with the DC process. They report no issues or concerns at this time.  Patient/Family's Understanding of and Emotional Response to Diagnosis, Current Treatment, and Prognosis:  Patient and sister has good understanding of the diagnosis, current treatment and prognosis and are in agreement with DC plan to SNF.  Pt and sister hopeful that short term rehabilitation will address her physical impairment.  They report no issues or concerns at this time.  Emotional Assessment Appearance:  Appears stated age Attitude/Demeanor/Rapport:   (Cooperative, Friendly) Affect (typically observed):  Accepting, Appropriate Orientation:  Oriented to Self, Oriented to Place, Oriented to  Time Alcohol / Substance use:  Not Applicable Psych involvement (Current and /or in the community):  No (Comment)  Discharge Needs  Concerns to be addressed:  Care Coordination Readmission within the last 30 days:  No Current discharge risk:  Dependent with Mobility, Physical Impairment Barriers to Discharge:  No Barriers Identified   Normajean Baxter, LCSW 01/21/2017, 4:50 PM

## 2017-01-22 ENCOUNTER — Other Ambulatory Visit: Payer: Self-pay | Admitting: Internal Medicine

## 2017-01-22 DIAGNOSIS — E119 Type 2 diabetes mellitus without complications: Secondary | ICD-10-CM

## 2017-01-22 DIAGNOSIS — Z794 Long term (current) use of insulin: Secondary | ICD-10-CM

## 2017-01-22 LAB — CULTURE, BLOOD (ROUTINE X 2): Special Requests: ADEQUATE

## 2017-01-22 LAB — HEMOGLOBIN A1C
Hgb A1c MFr Bld: 11.8 % — ABNORMAL HIGH (ref 4.8–5.6)
MEAN PLASMA GLUCOSE: 292 mg/dL

## 2017-01-22 LAB — CREATININE, SERUM
CREATININE: 0.99 mg/dL (ref 0.44–1.00)
GFR calc Af Amer: >60 mL/min (ref >60)
GFR calc non Af Amer: 54 mL/min — ABNORMAL LOW (ref >60)

## 2017-01-22 LAB — GLUCOSE, CAPILLARY
GLUCOSE-CAPILLARY: 274 mg/dL — AB (ref 65–99)
Glucose-Capillary: 108 mg/dL — ABNORMAL HIGH (ref 65–99)
Glucose-Capillary: 202 mg/dL — ABNORMAL HIGH (ref 65–99)

## 2017-01-22 MED ORDER — AMOXICILLIN-POT CLAVULANATE 875-125 MG PO TABS
1.0000 | ORAL_TABLET | Freq: Two times a day (BID) | ORAL | Status: DC
  Administered 2017-01-22: 1 via ORAL
  Filled 2017-01-22: qty 1

## 2017-01-22 MED ORDER — AMOXICILLIN-POT CLAVULANATE 875-125 MG PO TABS: 1.0000 | ORAL_TABLET | Freq: Two times a day (BID) | ORAL | 0 refills | Status: AC

## 2017-01-22 MED ORDER — FERROUS GLUCONATE 325 MG PO TABS: 325.0000 mg | ORAL_TABLET | Freq: Every day | ORAL | 0 refills | Status: DC

## 2017-01-22 MED ORDER — TORSEMIDE 20 MG PO TABS: 20.0000 mg | ORAL_TABLET | Freq: Every day | ORAL | 0 refills | Status: DC

## 2017-01-22 MED ORDER — POTASSIUM CHLORIDE ER 10 MEQ PO TBCR: 10.0000 meq | EXTENDED_RELEASE_TABLET | Freq: Every day | ORAL | 0 refills | Status: DC

## 2017-01-22 MED ORDER — FLUCONAZOLE 100 MG PO TABS
100.0000 mg | ORAL_TABLET | Freq: Every day | ORAL | 0 refills | Status: AC
Start: 2017-01-23 — End: 2017-01-26

## 2017-01-22 NOTE — Clinical Social Work Placement (Signed)
Patient received and accepted bed offer at [facility]. PTAR contacted, patient aware declined for family to be notified. Patient's RN can call report to [phone]. Packet complete.   CLINICAL SOCIAL WORK PLACEMENT  NOTE  Date:  01/22/2017  Patient Details  Name: Julie Mora MRN: [id number] Date of Birth: 1941/08/29  Clinical Social Work is seeking post-discharge placement for this patient at the [place] level of care (*CSW will initial, date and re-position this form in  chart as items are completed):  Yes   Patient/family provided with [place] [facility] Department's list of facilities offering this level of care within the geographic area requested by the patient (or if unable, by the patient's family).  Yes   Patient/family informed of their freedom to choose among providers that offer the needed level of care, that participate in Medicare, Medicaid or managed care program needed by the patient, have an available bed and are willing to accept the patient.  Yes   Patient/family informed of 's ownership interest in [facility] and [facility], as well as of the fact that they are under no obligation to receive care at these facilities.  PASRR submitted to EDS on       PASRR number received on       Existing PASRR number confirmed on 01/21/17     FL2 transmitted to all facilities in geographic area requested by pt/family on 01/21/17     FL2 transmitted to all facilities within larger geographic area on       Patient informed that his/her managed care company has contracts with or will negotiate with certain facilities, including the following:        Yes   Patient/family informed of bed offers received.  Patient chooses bed at [facility]     Physician recommends and patient chooses bed at      Patient to be transferred to [facility] on 01/22/17.  Patient to be transferred to facility by PTAR     Patient family notified on  01/22/17 of transfer.  Name of family member notified:  Patient reported that there is no family for CSW to contact, patient reported that she will contact her sister     PHYSICIAN       Additional Comment:    _______________________________________________ Burnis Medin, LCSW 01/22/2017, 3:17 PM

## 2017-01-22 NOTE — Care Management Note (Signed)
Case Management Note  Patient Details  Name: Julie Mora MRN: [id number] Date of Birth: 08-18-1941  Subjective/Objective:                    Action/Plan:   Expected Discharge Date:  01/20/17               Expected Discharge Plan:  Skilled Nursing Facility  In-House Referral:  Clinical Social Work  Discharge planning Services  CM Consult  Post Acute Care Choice:    Choice offered to:     DME Arranged:    DME Agency:     HH Arranged:    [place] Agency:     Status of Service:  Completed, signed off  If discussed at [employer], dates discussed:    Additional CommentsPurcell Mouton, RN 01/22/2017, 11:48 AM

## 2017-01-22 NOTE — Progress Notes (Signed)
Physical Therapy Treatment Patient Details Name: Julie Mora MRN: [id number] DOB: 1941/03/21 Today's Date: 01/22/2017    History of Present Illness 76  yo female brought toED febrile , tachycardic , her blood pressure and saturations stable .  Chest x-ray showed patchy opacities in the central and basilar right lung, concerning for early pneumonia.  She was treated for sepsis,    PT Comments    Pt OOB in recliner.  Assisted with amb an increased distance.  Required several standing rest breaks.  Pt plans ST Rehab at SNF to regain prior level of mobility/Independence.  Follow Up Recommendations  SNF;Supervision/Assistance - 24 hour     Equipment Recommendations  None recommended by PT    Recommendations for Other Services       Precautions / Restrictions Precautions Precautions: Fall Precaution Comments: skin lesions Restrictions Weight Bearing Restrictions: No    Mobility  Bed Mobility               General bed mobility comments: OOB in recliner  Transfers Overall transfer level: Needs assistance Equipment used: Rolling walker (2 wheeled) Transfers: Sit to/from Stand Sit to Stand: Mod assist         General transfer comment: increased effort and time  Ambulation/Gait Ambulation/Gait assistance: Min assist Ambulation Distance (Feet): 28 Feet Assistive device: Rolling walker (2 wheeled) Gait Pattern/deviations: Step-to pattern;Wide base of support;Trunk flexed;Step-through pattern;Decreased stride length     General Gait Details: very slow speed, trunk leaning forward over [place] multimodal cues for safety as patient lets go of RW too soon, does not completely turn to surface before sitting down.   Stairs            Wheelchair Mobility    Modified Rankin (Stroke Patients Only)       Balance                                            Cognition Arousal/Alertness: Awake/alert Behavior During Therapy: WFL for tasks  assessed/performed Overall Cognitive Status: Within Functional Limits for tasks assessed                                        Exercises      General Comments        Pertinent Vitals/Pain      Home Living                      Prior Function            PT Goals (current goals can now be found in the care plan section) Progress towards PT goals: Progressing toward goals    Frequency    Min 3X/week      PT Plan Current plan remains appropriate    Co-evaluation              AM-PAC PT "6 Clicks" Daily Activity  Outcome Measure  Difficulty turning over in bed (including adjusting bedclothes, sheets and blankets)?: A Lot Difficulty moving from lying on back to sitting on the side of the bed? : A Lot Difficulty sitting down on and standing up from a chair with arms (e.g., wheelchair, bedside commode, etc,.)?: A Lot Help needed moving to and from a bed to chair (including a wheelchair)?: A Little Help  needed walking in hospital room?: A Little Help needed climbing 3-5 steps with a railing? : A Lot 6 Click Score: 14    End of Session Equipment Utilized During Treatment: Gait belt Activity Tolerance: Patient limited by fatigue Patient left: in chair;with call bell/phone within reach;with chair alarm set Nurse Communication: Mobility status PT Visit Diagnosis: Unsteadiness on feet (R26.81);Difficulty in walking, not elsewhere classified (R26.2)     Time: 1521-1540 PT Time Calculation (min) (ACUTE ONLY): 19 min  Charges:  $Gait Training: 8-22 mins                    G Codes:       Rica Koyanagi  PTA [facility]  Acute  Rehab Pager      [phone]

## 2017-01-22 NOTE — Progress Notes (Signed)
Pt discharged to [facility], report called to Lelon Frohlich, pt prepared and discharged instruction reviewed, awaits EMS. SRP, RN

## 2017-01-22 NOTE — Discharge Summary (Signed)
Discharge Summary  Julie Mora JZP:[id number] DOB: 25-Oct-1940  PCP: Elby Showers, MD  Admit date: 01/19/2017 Discharge date: 01/22/2017  Time spent: >8mins  Recommendations for Outpatient Follow-up:  1. F/u with SNF MD for hospital discharge follow up, repeat cbc/bmp at follow up, 2.  f/u with pmd upon discharge from snf, pmd to discuss with patient the indication for long term use of florinef (patient report she has been on it for a long time, but she is not sure what it is for) 3. f/u with oncology Dr Alen Blew   Discharge Diagnoses:  Active Hospital Problems   Diagnosis Date Noted  . Sepsis ([place]) 01/19/2017  . Aspiration pneumonia ([place]) 01/19/2017  . Elevated liver enzymes 11/17/2012  . Dependent edema 04/19/2011  . History of deep venous thrombosis 04/19/2011  . HYPERCHOLESTEROLEMIA 05/28/2009  . Essential hypertension 05/28/2009    Resolved Hospital Problems   Diagnosis Date Noted Date Resolved  No resolved problems to display.    Discharge Condition: stable  Diet recommendation: heart healthy/carb modified  Filed Weights   01/19/17 0428 01/19/17 0859  Weight: 112 kg (247 lb) 114.8 kg (253 lb)    History of present illness:  PCP: Elby Showers, MD  Patient coming from: home  Chief Complaint: AMS  HPI: Julie Mora is a 76 y.o. female with medical history significant of pemphigus foliaceous, type 2 diabetes, hypertension, hyperlipidemia, prior history of DVT on chronic anticoagulation, presents to the emergency room with chief complaint of altered mental status and fever.  On my evaluation the emergency room, patient feels well, she is alert and oriented 4, and initially is refusing admission.  She tells me she does not know what happened, she was in her recliner watching TV last night, when she felt cramping in her legs, thought her potassium was low and went to the kitchen and drank a glass of orange juice.  Following that, she had nausea and an episode of  vomiting.  Her sister checked on her little bit later and found the patient in the recliner with emesis all around her mouth and her chest, and she was feeling very hot and was a little bit confused.  She was brought to the ED.  Patient is taking rituximab as an outpatient for her pemphigus foliaceous, and had an infusion about 2 days ago.  She saw her PCP yesterday and had a pneumonia shot.  She felt her arm was very sore and warm last night.  She has no abdominal pain, currently has no nausea and wants to eat.  She had a finger infection at the end of last month, and was placed on antibiotics which she still taking.  She had a diarrheal episode about 2 days ago, watery, but has resolved since.  ED Course: In the ED, she was found to be febrile to 102.5, tachycardic to 114, her blood pressure was stable and she was satting well on room air.  Chest x-ray showed patchy opacities in the central and basilar right lung, concerning for early pneumonia.  She was treated for sepsis, placed on vancomycin and Zosyn, and TRH was asked for admission.  Hospital Course:  Active Problems:   HYPERCHOLESTEROLEMIA   Essential hypertension   History of deep venous thrombosis   Dependent edema   Elevated liver enzymes   Sepsis ([place])   Aspiration pneumonia ([place])  Concern for sepsis due to pneumonia/UTI in an immunocompromized patient ( received rituximab on 5/2, insulin dependent dm2, and phemphigus) -She presented with fever  102.5, sinus tachycardia, hear rate 114, tachypnea rr 22-28, lactic acidosis -Patient with an emesis episode followed by fever, possible component of  aspiration pneumonitis (she reported n/v at home), she denies cough, no hypoxia, no chest pain, she is convinced this is from her pneumonia shot  She received a few days ago -cxr mild infiltrate right lower lung? ua rare bacteria, trace leuk, urine culture + enterococcus , blood culture 1/2 coag negative staph, likely contamination mrsa screen  negative, repeat blood culture pending - d/c  Vancomycin, continue  Zosyn that was started in the emergency room   --improving ,fever subsided, lactic acidosis resolved. d/c on augmentin on 5/7.  Mild AKI, on presentation Likely from uti, she denies dysuria ( but she is on pyridium chronically, I did notice strong urine order initially) Bun/cr 26/1.05 on admission, bun/cr 17/0.8 on 5/6   Insulin dependent Diabetes mellitus a1c 11.8 -continue home dose lantus, diet modification, pmd to continue work with patient for blood sugar control  Pemphigus foliaceous s/p rituximab on 5/2, report skin looks better after just one shot  -Skin care -Outpatient management  Yeasty skin changes under bilateral breasts, under abdominal pannus: topical nystatin, interdry, oral diflucan  Chronic venous stasis changes, pitting edema bilateral lower extremity,( report chronic lymphodema)  home meds demadex held today, d/c ivf, resume demadex on 5/6 ( patient report she has stopped taking demadex in 2016, she does has significant bilateral lower extremity edema which has improved with demadex, will resume demadex) Elevate legs  Hyperlipidemia -Resume statin  History of DVT -Already on Pradaxa, continue   Morbid obesity: Body mass index is 43.43 kg/m.   long term use of florinef (patient report she has been on it for a long time, but she is not sure what it is for) pmd to address, taper off florinef if no longer indicated, cause side effect of fluids retention and hyperglycemia,  FTT: PT recommend SNF, social worker consutled    DVT prophylaxis:Pradaxa Code Status:Full code Family Communication:patient Disposition Plan:SNF on 5/7   Consultants:  none  Procedures:  none  Antibiotics:  vanc from admission to 5/6  zosyn from admission  Discharge Exam: BP 114/73 (BP Location: Left Arm)   Pulse 89   Temp 98.2 F (36.8 C) (Oral)   Resp 18   Ht 5\' 4"  (1.626  m)   Wt 114.8 kg (253 lb)   LMP  (LMP Unknown)   SpO2 93%   BMI 43.43 kg/m     General:  NAD, pleasant  Cardiovascular: RRR  Respiratory: CTABL  Abdomen: Soft/ND/NT, positive BS  Musculoskeletal: chronic venous stasis changes, pitting  Edema bilateral lower extremity has improved at discharge  Skin: yeasty skin changes bilateral groin, under abdominal pannus, under bilateral breasts, other mild erythmatous macules ( report has much improved after she received treatment with ritaximab)  Neuro: aaox3   Discharge Instructions You were cared for by a hospitalist during your hospital stay. If you have any questions about your discharge medications or the care you received while you were in the hospital after you are discharged, you can call the unit and asked to speak with the hospitalist on call if the hospitalist that took care of you is not available. Once you are discharged, your primary care physician will handle any further medical issues. Please note that NO REFILLS for any discharge medications will be authorized once you are discharged, as it is imperative that you return to your primary care physician (or establish a relationship with  a primary care physician if you do not have one) for your aftercare needs so that they can reassess your need for medications and monitor your lab values.  Discharge Instructions    Diet - low sodium heart healthy    Complete by:  As directed    Carb modified   Increase activity slowly    Complete by:  As directed      Allergies as of 01/22/2017      Reactions   Azathioprine Shortness Of Breath   Cellcept [mycophenolate Mofetil] Nausea Only   severe nausea   Ramipril Cough      Medication List    STOP taking these medications   ergocalciferol 50000 units capsule Commonly known as:  VITAMIN D2   potassium chloride 10 MEQ tablet Commonly known as:  K-DUR,KLOR-CON   sulfamethoxazole-trimethoprim 800-160 MG tablet Commonly known as:   BACTRIM DS,SEPTRA DS     TAKE these medications   ACCU-CHEK AVIVA PLUS test strip Generic drug:  glucose blood USE TO CHECK BLOOD SUGAR BEFORE BREAKFAST AND SUPPER   acetaminophen 500 MG tablet Commonly known as:  TYLENOL Take 500 mg by mouth every 6 (six) hours as needed for pain.   alendronate 70 MG tablet Commonly known as:  FOSAMAX TAKE 1 TABLET(70 MG) BY MOUTH EVERY 7 DAYS WITH A FULL GLASS OF WATER AND ON AN EMPTY STOMACH   amoxicillin-clavulanate 875-125 MG tablet Commonly known as:  AUGMENTIN Take 1 tablet by mouth every 12 (twelve) hours.   BD PEN NEEDLE NANO U/F 32G X 4 MM Misc Generic drug:  Insulin Pen Needle USE AS DIRECTED   CALCIUM ASCORBATE PO Take 1 tablet by mouth daily.   CULTURELLE PO Take 1 capsule by mouth daily.   ferrous gluconate 325 MG tablet Commonly known as:  FERGON Take 1 tablet (325 mg total) by mouth daily with breakfast. What changed:  when to take this   fluconazole 100 MG tablet Commonly known as:  DIFLUCAN Take 1 tablet (100 mg total) by mouth daily. Start taking on:  01/23/2017   fludrocortisone 0.1 MG tablet Commonly known as:  FLORINEF TAKE 2 TABLETS BY MOUTH TWICE DAILY   gabapentin 300 MG capsule Commonly known as:  NEURONTIN TK ONE C PO HS   insulin aspart 100 UNIT/ML injection Commonly known as:  novoLOG Inject 5-12 Units into the skin 2 (two) times daily as needed for high blood sugar.   Insulin Glargine 100 UNIT/ML Solostar Pen Commonly known as:  LANTUS SOLOSTAR Inject 35 units in skin at bedtime   multivitamin per tablet Take 1 tablet by mouth daily.   phenazopyridine 95 MG tablet Commonly known as:  PYRIDIUM Take 95 mg by mouth 2 (two) times daily.   potassium chloride 10 MEQ tablet Commonly known as:  K-DUR Take 1 tablet (10 mEq total) by mouth daily.   PRADAXA 75 MG Caps capsule Generic drug:  dabigatran TAKE 1 CAPSULE(75 MG) BY MOUTH TWICE DAILY   prednisoLONE acetate 1 % ophthalmic  suspension Commonly known as:  PRED FORTE Place 1 drop into both eyes 2 (two) times daily.   simvastatin 20 MG tablet Commonly known as:  ZOCOR TAKE 1 TABLET BY MOUTH EVERY NIGHT AT BEDTIME   SYSTANE 0.4-0.3 % Soln Generic drug:  Polyethyl Glycol-Propyl Glycol Apply 1 drop to eye 3 (three) times daily. Both eyes, as needed   torsemide 20 MG tablet Commonly known as:  DEMADEX Take 1 tablet (20 mg total) by mouth daily. What changed:  See the new instructions.   triamcinolone cream 0.1 % Commonly known as:  KENALOG Apply 1 application topically daily.   Vitamin D 2000 units tablet Take 2,000 Units by mouth daily.      Allergies  Allergen Reactions  . Azathioprine Shortness Of Breath  . Cellcept [Mycophenolate Mofetil] Nausea Only    severe nausea  . Ramipril Cough    Contact information for follow-up providers    Baxley, Cresenciano Lick, MD Follow up in 1 week(s).   Specialty:  Internal Medicine Why:  hospital discharge follow up, need to repeat cxr 2view in 3-4 weeks to ensure resolution of pneumonia Contact information: [address] [place] [phone]        Wyatt Portela, MD Follow up on 01/24/2017.   Specialty:  Oncology Contact information: [place] [address] [phone]            Contact information for after-discharge care    Destination    [facility] Follow up.   Specialty:  [place] information: [address] [place] [phone]                   The results of significant diagnostics from this hospitalization (including imaging, microbiology, ancillary and laboratory) are listed below for reference.    Significant Diagnostic Studies: Dg Chest Port 1 View  Result Date: 01/19/2017 CLINICAL DATA:  Fever and sepsis EXAM: PORTABLE CHEST 1 VIEW COMPARISON:  11/15/2010 FINDINGS: Central and basilar right lung opacities. Left lung  is clear. No large effusions. Normal pulmonary vasculature. IMPRESSION: Patchy opacities in the central and basilar right lung. This could represent early pneumonia. Followup PA and lateral chest X-ray is recommended in 3-4 weeks following trial of antibiotic therapy to ensure resolution and exclude underlying malignancy. Electronically Signed   By: Andreas Newport M.D.   On: 01/19/2017 04:59    Microbiology: Recent Results (from the past 240 hour(s))  Blood Culture (routine x 2)     Status: None (Preliminary result)   Collection Time: 01/19/17  4:30 AM  Result Value Ref Range Status   Specimen Description BLOOD RIGHT ANTECUBITAL  Final   Special Requests IN PEDIATRIC BOTTLE Blood Culture adequate volume  Final   Culture   Final    NO GROWTH 2 DAYS Performed at [facility], [address], [place], [place] [address]    Report Status PENDING  Incomplete  Blood Culture (routine x 2)     Status: Abnormal   Collection Time: 01/19/17  4:30 AM  Result Value Ref Range Status   Specimen Description BLOOD RIGHT HAND  Final   Special Requests   Final    BOTTLES DRAWN AEROBIC AND ANAEROBIC Blood Culture adequate volume   Culture  Setup Time   Final    GRAM POSITIVE COCCI IN CLUSTERS AEROBIC BOTTLE ONLY CRITICAL RESULT CALLED TO, READ BACK BY AND VERIFIED WITH: T. PICKERING PHARMD, AT 1206 01/20/17 BY D. VANHOOK    Culture (A)  Final    STAPHYLOCOCCUS SPECIES (COAGULASE NEGATIVE) THE SIGNIFICANCE OF ISOLATING THIS ORGANISM FROM A SINGLE SET OF BLOOD CULTURES WHEN MULTIPLE SETS ARE DRAWN IS UNCERTAIN. PLEASE NOTIFY THE MICROBIOLOGY DEPARTMENT WITHIN ONE WEEK IF SPECIATION AND SENSITIVITIES ARE REQUIRED. Performed at [facility], [place] [address], [place], [place] [address]    Report Status 01/22/2017 FINAL  Final  Blood Culture ID Panel (Reflexed)     Status: Abnormal  Collection Time: 01/19/17  4:30 AM  Result Value Ref Range Status   Enterococcus species NOT DETECTED NOT DETECTED  Final   Listeria monocytogenes NOT DETECTED NOT DETECTED Final   Staphylococcus species DETECTED (A) NOT DETECTED Final    Comment: Methicillin (oxacillin) susceptible coagulase negative staphylococcus. Possible blood culture contaminant (unless isolated from more than one blood culture draw or clinical case suggests pathogenicity). No antibiotic treatment is indicated for blood  culture contaminants. CRITICAL RESULT CALLED TO, READ BACK BY AND VERIFIED WITH: T. PICKERING PHARMD, AT 1206 01/20/17 BY D. VANHOOK    Staphylococcus aureus NOT DETECTED NOT DETECTED Final   Methicillin resistance NOT DETECTED NOT DETECTED Final   Streptococcus species NOT DETECTED NOT DETECTED Final   Streptococcus agalactiae NOT DETECTED NOT DETECTED Final   Streptococcus pneumoniae NOT DETECTED NOT DETECTED Final   Streptococcus pyogenes NOT DETECTED NOT DETECTED Final   Acinetobacter baumannii NOT DETECTED NOT DETECTED Final   Enterobacteriaceae species NOT DETECTED NOT DETECTED Final   Enterobacter cloacae complex NOT DETECTED NOT DETECTED Final   Escherichia coli NOT DETECTED NOT DETECTED Final   Klebsiella oxytoca NOT DETECTED NOT DETECTED Final   Klebsiella pneumoniae NOT DETECTED NOT DETECTED Final   Proteus species NOT DETECTED NOT DETECTED Final   Serratia marcescens NOT DETECTED NOT DETECTED Final   Haemophilus influenzae NOT DETECTED NOT DETECTED Final   Neisseria meningitidis NOT DETECTED NOT DETECTED Final   Pseudomonas aeruginosa NOT DETECTED NOT DETECTED Final   Candida albicans NOT DETECTED NOT DETECTED Final   Candida glabrata NOT DETECTED NOT DETECTED Final   Candida krusei NOT DETECTED NOT DETECTED Final   Candida parapsilosis NOT DETECTED NOT DETECTED Final   Candida tropicalis NOT DETECTED NOT DETECTED Final    Comment: Performed at [facility], [place] [address] [place], [place] [address]  Urine culture     Status: Abnormal   Collection Time: 01/19/17  6:40 AM  Result Value Ref  Range Status   Specimen Description URINE, CATHETERIZED  Final   Special Requests NONE  Final   Culture >=100,000 COLONIES/mL ENTEROCOCCUS FAECALIS (A)  Final   Report Status 01/21/2017 FINAL  Final   Organism ID, Bacteria ENTEROCOCCUS FAECALIS (A)  Final      Susceptibility   Enterococcus faecalis - MIC*    AMPICILLIN <=2 SENSITIVE Sensitive     LEVOFLOXACIN 1 SENSITIVE Sensitive     NITROFURANTOIN <=16 SENSITIVE Sensitive     VANCOMYCIN 1 SENSITIVE Sensitive     * >=100,000 COLONIES/mL ENTEROCOCCUS FAECALIS  MRSA PCR Screening     Status: None   Collection Time: 01/19/17  2:09 PM  Result Value Ref Range Status   MRSA by PCR NEGATIVE NEGATIVE Final    Comment:        The GeneXpert MRSA Assay (FDA approved for NASAL specimens only), is one component of a comprehensive MRSA colonization surveillance program. It is not intended to diagnose MRSA infection nor to guide or monitor treatment for MRSA infections.      Labs: Basic Metabolic Panel:  Recent Labs Lab 01/17/17 0843 01/19/17 0550 01/20/17 0549 01/21/17 0557 01/22/17 0458  NA 137 138 137 142  --   K 4.4 4.2 4.2 3.7  --   CL  --  107 107 111  --   CO2 21* 24 25 27   --   GLUCOSE 353* 269* 166* 134*  --   BUN 20.7 26* 17 17  --   CREATININE 1.1 1.05* 0.89 0.86 0.99  CALCIUM 9.8 8.4* 7.9* 8.0*  --   MG  --   --   --  1.7  --    Liver Function Tests:  Recent Labs Lab 01/17/17 0843 01/19/17 0550  AST 19 30  ALT 18 20  ALKPHOS 91 59  BILITOT 0.31 0.2*  PROT 6.9 5.9*  ALBUMIN 2.8* 2.5*   No results for input(s): LIPASE, AMYLASE in the last 168 hours. No results for input(s): AMMONIA in the last 168 hours. CBC:  Recent Labs Lab 01/17/17 0843 01/19/17 0430 01/20/17 0549 01/21/17 0557  WBC 7.5 10.0 8.7 6.2  NEUTROABS 4.8 8.2*  --  3.7  HGB 13.9 13.0 12.5 11.7*  HCT 41.2 38.5 37.9 37.2  MCV 95.9 95.1 95.2 98.7  PLT 198 188 167 167   Cardiac Enzymes: No results for input(s): CKTOTAL, CKMB,  CKMBINDEX, TROPONINI in the last 168 hours. BNP: BNP (last 3 results) No results for input(s): BNP in the last 8760 hours.  ProBNP (last 3 results) No results for input(s): PROBNP in the last 8760 hours.  CBG:  Recent Labs Lab 01/21/17 1157 01/21/17 1659 01/21/17 2129 01/22/17 0729 01/22/17 1224  GLUCAP 205* 233* 262* 108* 274*       Signed:  Amaani Guilbault MD, PhD  [facility] 01/22/2017, 1:01 PM

## 2017-01-22 NOTE — Patient Instructions (Signed)
Continue same meds and RTC in 6 months.

## 2017-01-22 NOTE — Progress Notes (Signed)
Chaplain stopped in to visit with Julie Mora the patient while rounding the floor.  Julie Mora is sitting up in the chair with her things on the bed.  She states that she is waiting to go to rehab.  Julie Mora is appreciative of Chaplain presence and talks about an experience she had when she was a patient at [facility] and how I reminded her of that pleasant experience with the Chaplain's then.  She was moved to tears.    Julie Mora thanked Chaplain for stopping in to visit and said "bless you".  Chaplain thankful for this patient and getting to stop in for a visit.   01/22/17 1444  Clinical Encounter Type  Visited With Patient  Visit Type Initial;Psychological support;Spiritual support;Social support  Stress Factors  Patient Stress Factors Health changes

## 2017-01-23 ENCOUNTER — Telehealth: Payer: Self-pay | Admitting: Oncology

## 2017-01-23 ENCOUNTER — Telehealth: Payer: Self-pay | Admitting: *Deleted

## 2017-01-23 NOTE — Telephone Encounter (Signed)
Spoke with patient. She is currently in a nursing rehab center.  States she is very weak. Per dr Alen Blew, cancel lab and chemotherapy for 01/24/17. Patient aware. Will keep next regularly scheduled appt on 01/31/17.

## 2017-01-23 NOTE — Telephone Encounter (Signed)
Lm on home and cell phone to call me. Re: chemo f/u

## 2017-01-23 NOTE — Telephone Encounter (Signed)
-----   Message from Carolin Guernsey, RN sent at 01/17/2017  4:27 PM EDT ----- Regarding: FS: Chemo F/U Call Patient of Dr. Alen Blew, first time Rituxan since 2014.  Had infusion reaction with rigors, was able to restart and tolerated remainder of infusion.  Please call tomorrow 5/3 to f/u with patient. Thank you!

## 2017-01-23 NOTE — Telephone Encounter (Signed)
Per 5/8 schedule message changed time of 5/30 f/u to 1:30 pm. Left message informing patient and patient to get updated schedule at tomorrow's visit.

## 2017-01-24 ENCOUNTER — Ambulatory Visit: Payer: No Typology Code available for payment source

## 2017-01-24 ENCOUNTER — Other Ambulatory Visit: Payer: No Typology Code available for payment source

## 2017-01-24 LAB — CULTURE, BLOOD (ROUTINE X 2)
Culture: NO GROWTH
SPECIAL REQUESTS: ADEQUATE

## 2017-01-26 LAB — CULTURE, BLOOD (ROUTINE X 2)
Culture: NO GROWTH
Culture: NO GROWTH
SPECIAL REQUESTS: ADEQUATE
Special Requests: ADEQUATE

## 2017-01-29 ENCOUNTER — Telehealth: Payer: Self-pay | Admitting: *Deleted

## 2017-01-29 NOTE — Telephone Encounter (Signed)
Patient is still in rehab after pneumonia  and is now taking P.T. To learn how to walk again, d/t weakness. She wants to know if she should have an ambulance to bring her here for chemotherapy on 01/31/17?

## 2017-01-29 NOTE — Telephone Encounter (Signed)
Lm on patient identified vm. Cancel chemo treatment this week. Patient to call us when she is d/c from rehab and has her strength back, to re-schedule missed appts.

## 2017-01-29 NOTE — Telephone Encounter (Signed)
Chemo on hold till she is better.

## 2017-01-31 ENCOUNTER — Telehealth: Payer: Self-pay | Admitting: Oncology

## 2017-01-31 ENCOUNTER — Ambulatory Visit: Payer: No Typology Code available for payment source

## 2017-01-31 ENCOUNTER — Other Ambulatory Visit: Payer: No Typology Code available for payment source

## 2017-01-31 NOTE — Telephone Encounter (Signed)
I spoke with Dr Alen Blew and the nurse and Dr Alen Blew said to cancel the 5/16 appointments and resume on 5/23.    Notified the facility of the appointments and changes

## 2017-02-07 ENCOUNTER — Other Ambulatory Visit: Payer: No Typology Code available for payment source

## 2017-02-07 ENCOUNTER — Ambulatory Visit: Payer: No Typology Code available for payment source

## 2017-02-09 ENCOUNTER — Telehealth: Payer: Self-pay | Admitting: Internal Medicine

## 2017-02-09 NOTE — Telephone Encounter (Signed)
Keisha from [facility] called to inform us that they received a referral for Physical Therapy from [facility] when she was discharged.  She states that anytime they are unable to get out to the home to see the patient within 48 hours of the initial request, they have to notify the PCP.  So, they are not able to get out to patient's home until 5/30.  So, they are notifying our office to make [place] aware of this.  Reason not given other than they just received the request late yesterday and the holiday is tacked on to this as well.    Phone they can be reached at:  [phone]  Thank you.

## 2017-02-13 ENCOUNTER — Telehealth: Payer: Self-pay

## 2017-02-13 NOTE — Telephone Encounter (Signed)
I can see her on 6/20 at 3:15pm no labs.

## 2017-02-13 NOTE — Telephone Encounter (Signed)
Pt called on [facility] [place] asking for a call back about her upcoming appt.  Pt was asking if the lab and infusion appt for 5/30 need to be cancelled b/c she was in the hospital and now she is home but still recovering from pneumonia. Cleared CXR but residue of a cold. Cough productive, clearing of color. Still on antibiotic.

## 2017-02-13 NOTE — Telephone Encounter (Signed)
S/w pt and she will not come tomorrow. S/w her that she has lab and infusion on 6/6 but she does not know where that appt came from. Her last 3 treatments have been cancelled d/t hospitalization. When does Dr Alen Blew want to see pt to reevaluate care plan? She would like to be rescheduled for 6/13 or after to get cold/pneumonia cleared up before resuming.

## 2017-02-13 NOTE — Telephone Encounter (Signed)
Sent LOS to scheduling.

## 2017-02-13 NOTE — Telephone Encounter (Signed)
OK to cancel.

## 2017-02-14 ENCOUNTER — Other Ambulatory Visit: Payer: No Typology Code available for payment source

## 2017-02-14 ENCOUNTER — Ambulatory Visit: Payer: No Typology Code available for payment source

## 2017-02-14 ENCOUNTER — Ambulatory Visit: Payer: No Typology Code available for payment source | Admitting: Oncology

## 2017-02-15 NOTE — Telephone Encounter (Signed)
lvm that per Dr Alen Blew she was r/s to 6/20 at 315 pm with Dr Alen Blew. Cancelled 6/6 lab and infusion.

## 2017-02-19 ENCOUNTER — Telehealth: Payer: Self-pay

## 2017-02-19 NOTE — Telephone Encounter (Signed)
Pt came home last Friday, June 1st, and will not be able to come in for next week or so. Made appt for patient to come in on 03/02/17 for hospital f/up and possible transitional care visit.

## 2017-02-19 NOTE — Telephone Encounter (Signed)
OK 

## 2017-02-19 NOTE — Telephone Encounter (Signed)
[facility], Rosalie Doctor, called to request physical therapy 2x a week for 6 weeks. Direct line [phone]

## 2017-02-19 NOTE — Telephone Encounter (Signed)
LM with Rosalie Doctor that the PT was ok to start.

## 2017-02-21 ENCOUNTER — Other Ambulatory Visit: Payer: No Typology Code available for payment source

## 2017-02-21 ENCOUNTER — Ambulatory Visit: Payer: No Typology Code available for payment source

## 2017-02-23 ENCOUNTER — Ambulatory Visit (INDEPENDENT_AMBULATORY_CARE_PROVIDER_SITE_OTHER): Payer: Medicare Other | Admitting: Podiatry

## 2017-02-23 ENCOUNTER — Encounter: Payer: Self-pay | Admitting: Podiatry

## 2017-02-23 DIAGNOSIS — B351 Tinea unguium: Secondary | ICD-10-CM | POA: Diagnosis not present

## 2017-02-23 DIAGNOSIS — M79674 Pain in right toe(s): Secondary | ICD-10-CM

## 2017-02-23 DIAGNOSIS — R609 Edema, unspecified: Secondary | ICD-10-CM

## 2017-02-23 DIAGNOSIS — E1149 Type 2 diabetes mellitus with other diabetic neurological complication: Secondary | ICD-10-CM

## 2017-02-23 NOTE — Progress Notes (Signed)
Patient ID: Julie Mora, female   DOB: 1941/09/12, 76 y.o.   MRN: [id number] Complaint:  Visit Type: Patient returns to my office for continued preventative foot care services. Complaint: Patient states" my nails have grown long and thick and become painful to walk and wear shoes" Patient has been diagnosed with DM with neuropathy.. The patient presents for preventative foot care services. No changes to ROS.    Podiatric Exam: Vascular: dorsalis pedis and posterior tibial pulses are diminished B/Lbilateral due to foot swelling.. Capillary return is 2 seconds.. Temperature gradient is WNL. Skin turgor WNL  Sensorium  Absent  Semmes Weinstein monofilament test. Absent tactile sensation bilaterally. Nail Exam: Pt has thick disfigured discolored nails with subungual debris noted bilateral entire nail hallux through fifth toenails Ulcer Exam: There is no evidence of ulcer or pre-ulcerative changes or infection. Orthopedic Exam: Muscle tone and strength are WNL. No limitations in general ROM. No crepitus or effusions noted. Foot type and digits show no abnormalities. Bony prominences are unremarkable. Skin: No Porokeratosis. No infection or ulcers  Diagnosis:  Onychomycosis, , Pain in right toe, pain in left toes diabetes with angiopathy.    Treatment & Plan Procedures and Treatment: Consent by patient was obtained for treatment procedures. The patient understood the discussion of treatment and procedures well. All questions were answered thoroughly reviewed. Debridement of mycotic and hypertrophic toenails, 1 through 5 bilateral and clearing of subungual debris. No ulceration, no infection noted.  Return Visit-Office Procedure: Patient instructed to return to the office for a follow up visit 3 months for continued evaluation and treatment.    Gardiner Barefoot DPM

## 2017-02-26 ENCOUNTER — Telehealth: Payer: Self-pay

## 2017-02-26 NOTE — Telephone Encounter (Signed)
LVM with the verbal OK

## 2017-02-26 NOTE — Telephone Encounter (Signed)
OK to approve. 

## 2017-02-26 NOTE — Telephone Encounter (Signed)
Brittney from [facility] called wanting a verbal order for OT 2x week for 2 weeks.Personal Call back number is [phone]

## 2017-03-02 ENCOUNTER — Ambulatory Visit (INDEPENDENT_AMBULATORY_CARE_PROVIDER_SITE_OTHER): Payer: Medicare Other | Admitting: Internal Medicine

## 2017-03-02 ENCOUNTER — Encounter: Payer: Self-pay | Admitting: Internal Medicine

## 2017-03-02 ENCOUNTER — Telehealth: Payer: Self-pay

## 2017-03-02 VITALS — BP 98/64 | HR 103 | Temp 98.5°F | Resp 20 | Wt 232.0 lb

## 2017-03-02 DIAGNOSIS — N183 Chronic kidney disease, stage 3 unspecified: Secondary | ICD-10-CM

## 2017-03-02 DIAGNOSIS — L109 Pemphigus, unspecified: Secondary | ICD-10-CM | POA: Diagnosis not present

## 2017-03-02 DIAGNOSIS — E1165 Type 2 diabetes mellitus with hyperglycemia: Secondary | ICD-10-CM | POA: Diagnosis not present

## 2017-03-02 DIAGNOSIS — I872 Venous insufficiency (chronic) (peripheral): Secondary | ICD-10-CM | POA: Diagnosis not present

## 2017-03-02 DIAGNOSIS — E784 Other hyperlipidemia: Secondary | ICD-10-CM | POA: Diagnosis not present

## 2017-03-02 DIAGNOSIS — L97901 Non-pressure chronic ulcer of unspecified part of unspecified lower leg limited to breakdown of skin: Secondary | ICD-10-CM

## 2017-03-02 DIAGNOSIS — E559 Vitamin D deficiency, unspecified: Secondary | ICD-10-CM | POA: Diagnosis not present

## 2017-03-02 DIAGNOSIS — Z86718 Personal history of other venous thrombosis and embolism: Secondary | ICD-10-CM | POA: Diagnosis not present

## 2017-03-02 DIAGNOSIS — E7849 Other hyperlipidemia: Secondary | ICD-10-CM

## 2017-03-02 DIAGNOSIS — M810 Age-related osteoporosis without current pathological fracture: Secondary | ICD-10-CM | POA: Diagnosis not present

## 2017-03-02 DIAGNOSIS — N390 Urinary tract infection, site not specified: Secondary | ICD-10-CM | POA: Diagnosis not present

## 2017-03-02 DIAGNOSIS — E8881 Metabolic syndrome: Secondary | ICD-10-CM | POA: Diagnosis not present

## 2017-03-02 DIAGNOSIS — L304 Erythema intertrigo: Secondary | ICD-10-CM

## 2017-03-02 DIAGNOSIS — L089 Local infection of the skin and subcutaneous tissue, unspecified: Secondary | ICD-10-CM

## 2017-03-02 MED ORDER — FERROUS GLUCONATE 324 (38 FE) MG PO TABS: 324.0000 mg | ORAL_TABLET | Freq: Every day | ORAL | 3 refills | Status: DC

## 2017-03-02 MED ORDER — TORSEMIDE 20 MG PO TABS: 20.0000 mg | ORAL_TABLET | Freq: Every day | ORAL | 3 refills | Status: DC

## 2017-03-02 MED ORDER — NYSTATIN 100000 UNIT/GM EX CREA: 1.0000 "application " | TOPICAL_CREAM | Freq: Two times a day (BID) | CUTANEOUS | 0 refills | Status: DC

## 2017-03-02 MED ORDER — TORSEMIDE 20 MG PO TABS: 20.0000 mg | ORAL_TABLET | Freq: Every day | ORAL | 0 refills | Status: DC

## 2017-03-02 MED ORDER — POTASSIUM CHLORIDE ER 10 MEQ PO TBCR: 10.0000 meq | EXTENDED_RELEASE_TABLET | Freq: Every day | ORAL | 3 refills | Status: DC

## 2017-03-02 NOTE — Telephone Encounter (Signed)
I told her we would stay off the med for now. She is not orthostatic nor symptomatic at the present time. Perhaps she can discuss with Dr. Chalmers Cater ,her endocrinologist.

## 2017-03-02 NOTE — Patient Instructions (Signed)
Continue to monitor blood pressure at home. Would not restart Florinef at this point. She may discuss this with Dr. Chalmers Cater, her endocrinologist. Have not made any changes in her medications today.

## 2017-03-02 NOTE — Progress Notes (Signed)
Subjective:    Patient ID: Julie Mora, female    DOB: [birth year], 76 y.o.   MRN: [id number]  HPI 76 year old Female for follow up of recent hospitalization.She was seen here on May 3 for health maintenance exam. Received Pneumovax 23 that day. Had  received infusion for Pemphigus with Rituxan on May 2. Presented to the Emergency Department in the early morning hours of May 4 with fever, altered mental status, chills. She had one blood culture that grew Staph coagulase negative species. Urine culture grew greater than 100,000 colonies Enterococcus faecalis. She had issues with hyperglycemia during her hospitalization. Chest x-ray on admission was abnormal with patchy opacities in the central and basilar right lung possibly representing early pneumonia. She has had follow-up x-ray at [facility] which was normal. This was done on May 20. She also had an infected finger at the time of her physical exam on May 3 and was taking antibiotics for that the time. Therefore, there were several reasons including an infected finger, a UTI, and an abnormal chest x-ray concerning for pneumonia that could of been etiologies for  a septic picture.   History of diabetes mellitus treated by Dr. Chalmers Cater, endocrinologist.  Florinef was discontinued at [facility].  Florinef appears to have been  started during a hospitalization in February 2012 when we were using paper charts. At that time, she had pemphigus treated with immunosuppressive therapy as an outpatient and subsequently developed Staph aureus and Proteus mirabilis bacteremia with sepsis. She had acute renal failure and hypokalemia. She carried a diagnosis of hypertension and diabetes at the time. Discharge medications included Florinef. She was hypotensive and was sent to  critical care unit then transferred to the care of hospitalist and was hemodynamically stable by 11/16/2010.   Patient is wondering about Florinef being discontinued by Dr. Sabra Heck at [facility]  who had indicated he wanted to discontinue it. I have looked at the paper chart and do not find an exact reason for Florinef being started during the hospitalization in the discharge summary but suspect it was due to low blood pressure/orthostasis since it has an off label use for that. She has never been diagnosed with adrenal insufficiency.  If her pressure remains low, we could restart it.        We have received a  Memo  from home health staff that they are concerned about low blood pressures but she's asymptomatic. She is not orthostatic but her blood pressure today is around 93-818 systolically both sitting and standing.  Hemoglobin A1c in January of this year was 10.2% and on May 6 was 11.8%. Recently seen by endocrinologist. Says Accu-Cheks are running around 150.    After hospitalization, was sent to [facility] for rehab. Retuxan has been put on hold for now. However she has no point with Dr. Alen Blew soon. She feels her pemphigus may be getting worse.  She is ambulating with a walker.  At the end of our visit she brings up that she may be getting another UTI. She wants to bring in a specimen from home next week.  Apparently has appointment soon to see her urologist. History of recurrent urinary infections.  She is concerned about her umbilical hernia but that is not a major issue at this point in time and she was reassured. She was told to watch out for signs of incarceration which we went over today.  Accuchecks running 150 -165. No longer on Metformin.  She is not on any antihypertensive medication but  does take Demadex for lower extremity edema and stasis.  His to have bone density study in the near future. Is taking vitamin D supplement.  Had diabetic eye exam at [facility] November 2017.    Review of Systems     Objective:   Physical Exam Neck supple without JVD or thyromegaly. Chest clear. Cardiac exam regular rate and rhythm normal S1 and S2. Small  umbilical hernia with skin breakdown consistent with pemphigus. Significant stasis dermatitis of the lower extremities with edema. Ambulating with a walker. Morbidly obese.       Assessment & Plan:  Poorly controlled diabetes mellitus  Hyperlipidemia   Pemphigus  Recent hospitalization with septic picture. Recent chest x-ray normal.  Recurrent urinary tract infections  Osteoporosis  Stasis dermatitis both legs  Vitamin D deficiency-now taking 2000 units vitamin D 3 daily  Intertrigo-treated with nystatin cream which was refilled today

## 2017-03-02 NOTE — Telephone Encounter (Signed)
Pt had a question about Flurinef and if her being off of that medication was the reason her blood pressure stays so low. Advised pt that you were handling other patients and we would handle this message as soon as we had the chance.

## 2017-03-03 ENCOUNTER — Other Ambulatory Visit: Payer: Self-pay | Admitting: Internal Medicine

## 2017-03-05 ENCOUNTER — Other Ambulatory Visit (INDEPENDENT_AMBULATORY_CARE_PROVIDER_SITE_OTHER): Payer: Medicare Other | Admitting: Internal Medicine

## 2017-03-05 DIAGNOSIS — N390 Urinary tract infection, site not specified: Secondary | ICD-10-CM

## 2017-03-05 DIAGNOSIS — R8290 Unspecified abnormal findings in urine: Secondary | ICD-10-CM

## 2017-03-05 LAB — POCT URINALYSIS DIPSTICK
BILIRUBIN UA: NEGATIVE
Glucose, UA: NEGATIVE
KETONES UA: NEGATIVE
Nitrite, UA: NEGATIVE
PH UA: 6 (ref 5.0–8.0)
Protein, UA: NEGATIVE
SPEC GRAV UA: 1.015 (ref 1.010–1.025)
Urobilinogen, UA: 0.2 E.U./dL

## 2017-03-07 ENCOUNTER — Ambulatory Visit (HOSPITAL_BASED_OUTPATIENT_CLINIC_OR_DEPARTMENT_OTHER): Payer: Medicare Other | Admitting: Oncology

## 2017-03-07 VITALS — BP 118/59 | HR 105 | Temp 98.6°F | Resp 18 | Ht 64.0 in | Wt 247.0 lb

## 2017-03-07 DIAGNOSIS — L102 Pemphigus foliaceous: Secondary | ICD-10-CM

## 2017-03-07 DIAGNOSIS — L109 Pemphigus, unspecified: Secondary | ICD-10-CM

## 2017-03-07 NOTE — Progress Notes (Signed)
Hematology and Oncology Follow Up Visit  Julie Mora [id number] [birth year] 76 y.o. 03/07/2017 3:18 PM  Principle Diagnosis: 76 year old woman with history of recurrent pemphigus foliaceous diagnosed in 2010.  Prior Therapy: She is status post Rituxan treatment with 375 mg per meter square intravenously on a weekly basis for 4 weeks completed in 2012. This was repeated in 2014 and achieved complete response after each treatment.  Current therapy: Rituxan with 375 mg weekly started on 01/17/2017. Therapy interrupted because of recent hospitalization and pneumonia.  Interim History:  Julie Mora presents today for a followup visit. She received her first week of rituximab infusion and did develop infusion-related reaction that was manageable. She developed subsequently pneumonia and was hospitalized and required a rehabilitation stay and missed her subsequent infusions. She has been discharged home at this time and have been doing well. She is no longer reporting any fevers or chills. She is not reporting any respiratory distress. She reports her skin lesions have improved dramatically although have not completely resolved.  She is not report any headaches, blurry vision, syncope or seizures. She does not report any fevers, chills or sweats. She does not report any cough, wheezing or hemoptysis. She does not report any nausea, vomiting or abdominal pain. She is not reporting frequency urgency or hesitancy. She does not report any skeletal complaints. Remaining review of systems unremarkable.  Medications: I have reviewed the patient's current medications.  Current Outpatient Prescriptions  Medication Sig Dispense Refill  . ACCU-CHEK AVIVA PLUS test strip USE TO CHECK BLOOD SUGAR BEFORE BREAKFAST AND SUPPER 50 each prn  . acetaminophen (TYLENOL) 500 MG tablet Take 500 mg by mouth every 6 (six) hours as needed for pain.    Marland Kitchen alendronate (FOSAMAX) 70 MG tablet TAKE 1 TABLET(70 MG) BY MOUTH EVERY 7  DAYS WITH A FULL GLASS OF WATER AND ON AN EMPTY STOMACH 4 tablet 0  . BD PEN NEEDLE NANO U/F 32G X 4 MM MISC USE AS DIRECTED 100 each prn  . CALCIUM ASCORBATE PO Take 1 tablet by mouth daily.    . Cholecalciferol (VITAMIN D) 2000 UNITS tablet Take 2,000 Units by mouth daily.      . ferrous gluconate (FERGON) 324 MG tablet Take 1 tablet (324 mg total) by mouth daily with breakfast. 90 tablet 3  . ferrous gluconate (FERGON) 325 MG tablet Take 1 tablet (325 mg total) by mouth daily with breakfast. 30 tablet 0  . gabapentin (NEURONTIN) 300 MG capsule TK ONE C PO HS  11  . insulin aspart (NOVOLOG) 100 UNIT/ML injection Inject 5-12 Units into the skin 2 (two) times daily as needed for high blood sugar. 3 vial PRN  . Insulin Glargine (LANTUS SOLOSTAR) 100 UNIT/ML Solostar Pen Inject 35 units in skin at bedtime 15 mL prn  . Lactobacillus Rhamnosus, GG, (CULTURELLE PO) Take 1 capsule by mouth daily.    . multivitamin (THERAGRAN) per tablet Take 1 tablet by mouth daily.      Marland Kitchen nystatin cream (MYCOSTATIN) Apply 1 application topically 2 (two) times daily. 30 g 0  . Polyethyl Glycol-Propyl Glycol (SYSTANE) 0.4-0.3 % SOLN Apply 1 drop to eye 3 (three) times daily. Both eyes, as needed    . potassium chloride (K-DUR) 10 MEQ tablet Take 1 tablet (10 mEq total) by mouth daily. 90 tablet 3  . PRADAXA 75 MG CAPS capsule TAKE 1 CAPSULE(75 MG) BY MOUTH TWICE DAILY 60 capsule 0  . prednisoLONE acetate (PRED FORTE) 1 % ophthalmic suspension Place 1 drop  into both eyes 2 (two) times daily.     . simvastatin (ZOCOR) 20 MG tablet TAKE 1 TABLET BY MOUTH EVERY NIGHT AT BEDTIME 30 tablet 2  . torsemide (DEMADEX) 20 MG tablet Take 1 tablet (20 mg total) by mouth daily. 90 tablet 3  . triamcinolone cream (KENALOG) 0.1 % Apply 1 application topically daily.  5   No current facility-administered medications for this visit.      Allergies:  Allergies  Allergen Reactions  . Azathioprine Shortness Of Breath  . Cellcept  [Mycophenolate Mofetil] Nausea Only    severe nausea  . Ramipril Cough    Past Medical History, Surgical history, Social history, and Family History were reviewed and updated.  Review of Systems:  Remaining ROS negative. Physical Exam: Blood pressure (!) 118/59, pulse (!) 105, temperature 98.6 F (37 C), temperature source Oral, resp. rate 18, height 5\' 4"  (1.626 m), weight 247 lb (112 kg), SpO2 95 %. ECOG:  1 General appearance: Alert, awake woman without distress. Head: Normocephalic, without obvious abnormality. No oral ulcers or lesions. Neck: No thyroid masses. Lymph nodes: Cervical, supraclavicular, and axillary nodes normal. Heart:regular rate and rhythm, S1, S2 normal, no murmur, click, rub or gallop Lung:chest clear, no wheezing, rales, normal symmetric air entry Abdomin: soft, non-tender, without masses or organomegaly EXT:no erythema, induration, or nodules SKIN: Very faint pemphigus lesions noted on her back. More noted on her knees.  Lab Results: Lab Results  Component Value Date   WBC 6.2 01/21/2017   HGB 11.7 (L) 01/21/2017   HCT 37.2 01/21/2017   MCV 98.7 01/21/2017   PLT 167 01/21/2017     Chemistry      Component Value Date/Time   NA 142 01/21/2017 0557   NA 137 01/17/2017 0843   K 3.7 01/21/2017 0557   K 4.4 01/17/2017 0843   CL 111 01/21/2017 0557   CO2 27 01/21/2017 0557   CO2 21 (L) 01/17/2017 0843   BUN 17 01/21/2017 0557   BUN 20.7 01/17/2017 0843   CREATININE 0.99 01/22/2017 0458   CREATININE 1.1 01/17/2017 0843      Component Value Date/Time   CALCIUM 8.0 (L) 01/21/2017 0557   CALCIUM 9.8 01/17/2017 0843   ALKPHOS 59 01/19/2017 0550   ALKPHOS 91 01/17/2017 0843   AST 30 01/19/2017 0550   AST 19 01/17/2017 0843   ALT 20 01/19/2017 0550   ALT 18 01/17/2017 0843   BILITOT 0.2 (L) 01/19/2017 0550   BILITOT 0.31 01/17/2017 0843       Impression and Plan:  76 year old woman with:  1. Pemphigus foliaceous. She is status post  Rituxan treatment in 2012 and recent retreatment that was completed on 06/06/2013. She tolerated treatments very well and have been in remission since that time.  She developed recurrent disease in April 2018 and I have been requested by her dermatologist to restart systemic treatment.   She received the first of 4 weekly infusion on 01/17/2017 with therapy interrupted because of recent hospitalization and illness.  Risks and benefits of resuming rituximab were reviewed today. The plan is to continue in the near future with weekly rituximab for a total of 3 treatments.  She did develop infusion-related reaction that was manageable with low-dose Demerol and steroids. She understands that she might develop some infusion-related problems but anticipate she would be able to continue and complete the last 3 weeks of treatments.   2. Follow-up: Will be upon the completion of the intravenous treatments to follow her progress.  Zola Button, MD 6/20/20183:18 PM

## 2017-03-08 ENCOUNTER — Telehealth: Payer: Self-pay | Admitting: Oncology

## 2017-03-08 LAB — URINE CULTURE

## 2017-03-08 NOTE — Telephone Encounter (Signed)
Scheduled appt per 6/20 called to confirm appts with patient. Patient aware of appts time and date.

## 2017-03-09 ENCOUNTER — Encounter: Payer: Self-pay | Admitting: Internal Medicine

## 2017-03-09 ENCOUNTER — Telehealth: Payer: Self-pay

## 2017-03-09 MED ORDER — CIPROFLOXACIN HCL 250 MG PO TABS: 250.0000 mg | ORAL_TABLET | Freq: Two times a day (BID) | ORAL | 0 refills | Status: DC

## 2017-03-09 NOTE — Telephone Encounter (Signed)
Pt is going to start cipro for a UTI. She is asking if there is any reason she should not take it in light of her rituxan infusion due 03/14/17. This RN told pt it was OK to take the cipro, but if Dr Alen Blew thought otherwise he would let her know. Message forwarded.

## 2017-03-09 NOTE — Telephone Encounter (Signed)
Sent medication electronically.

## 2017-03-09 NOTE — Telephone Encounter (Signed)
-----   Message from Elby Showers, MD sent at 03/08/2017 10:52 PM EDT ----- Pt has . Please call in Cipro 250 mg bid x 10 days with follow up nurse visit in 10 days.

## 2017-03-14 ENCOUNTER — Other Ambulatory Visit (HOSPITAL_BASED_OUTPATIENT_CLINIC_OR_DEPARTMENT_OTHER): Payer: Medicare Other

## 2017-03-14 ENCOUNTER — Telehealth: Payer: Self-pay | Admitting: Oncology

## 2017-03-14 ENCOUNTER — Ambulatory Visit (HOSPITAL_BASED_OUTPATIENT_CLINIC_OR_DEPARTMENT_OTHER): Payer: Medicare Other

## 2017-03-14 VITALS — BP 108/72 | HR 76 | Temp 97.7°F | Resp 20

## 2017-03-14 DIAGNOSIS — L102 Pemphigus foliaceous: Secondary | ICD-10-CM

## 2017-03-14 DIAGNOSIS — L109 Pemphigus, unspecified: Secondary | ICD-10-CM

## 2017-03-14 DIAGNOSIS — Z5112 Encounter for antineoplastic immunotherapy: Secondary | ICD-10-CM | POA: Diagnosis not present

## 2017-03-14 LAB — CBC WITH DIFFERENTIAL/PLATELET
BASO%: 0.6 % (ref 0.0–2.0)
Basophils Absolute: 0 10*3/uL (ref 0.0–0.1)
EOS ABS: 0.3 10*3/uL (ref 0.0–0.5)
EOS%: 3.6 % (ref 0.0–7.0)
HCT: 39.6 % (ref 34.8–46.6)
HGB: 13.2 g/dL (ref 11.6–15.9)
LYMPH%: 17.4 % (ref 14.0–49.7)
MCH: 32.7 pg (ref 25.1–34.0)
MCHC: 33.4 g/dL (ref 31.5–36.0)
MCV: 97.9 fL (ref 79.5–101.0)
MONO#: 0.6 10*3/uL (ref 0.1–0.9)
MONO%: 7.6 % (ref 0.0–14.0)
NEUT#: 5.5 10*3/uL (ref 1.5–6.5)
NEUT%: 70.8 % (ref 38.4–76.8)
PLATELETS: 223 10*3/uL (ref 145–400)
RBC: 4.04 10*6/uL (ref 3.70–5.45)
RDW: 14.7 % — ABNORMAL HIGH (ref 11.2–14.5)
WBC: 7.7 10*3/uL (ref 3.9–10.3)
lymph#: 1.3 10*3/uL (ref 0.9–3.3)

## 2017-03-14 LAB — COMPREHENSIVE METABOLIC PANEL
ALT: 14 U/L (ref 0–55)
ANION GAP: 13 meq/L — AB (ref 3–11)
AST: 15 U/L (ref 5–34)
Albumin: 2.9 g/dL — ABNORMAL LOW (ref 3.5–5.0)
Alkaline Phosphatase: 68 U/L (ref 40–150)
BILIRUBIN TOTAL: 0.37 mg/dL (ref 0.20–1.20)
BUN: 29.1 mg/dL — ABNORMAL HIGH (ref 7.0–26.0)
CHLORIDE: 105 meq/L (ref 98–109)
CO2: 20 meq/L — AB (ref 22–29)
Calcium: 9.4 mg/dL (ref 8.4–10.4)
Creatinine: 1.3 mg/dL — ABNORMAL HIGH (ref 0.6–1.1)
EGFR: 39 mL/min/{1.73_m2} — ABNORMAL LOW (ref >90)
Glucose: 423 mg/dl — ABNORMAL HIGH (ref 70–140)
Potassium: 4.1 mEq/L (ref 3.5–5.1)
Sodium: 139 mEq/L (ref 136–145)
Total Protein: 6.8 g/dL (ref 6.4–8.3)

## 2017-03-14 MED ORDER — FAMOTIDINE IN NACL 20-0.9 MG/50ML-% IV SOLN
20.0000 mg | Freq: Two times a day (BID) | INTRAVENOUS | Status: DC
  Administered 2017-03-14: 20 mg via INTRAVENOUS

## 2017-03-14 MED ORDER — METHYLPREDNISOLONE SODIUM SUCC 125 MG IJ SOLR
125.0000 mg | Freq: Once | INTRAMUSCULAR | Status: AC
  Administered 2017-03-14: 125 mg via INTRAVENOUS

## 2017-03-14 MED ORDER — SODIUM CHLORIDE 0.9 % IV SOLN
375.0000 mg/m2 | Freq: Once | INTRAVENOUS | Status: AC
  Administered 2017-03-14: 800 mg via INTRAVENOUS
  Filled 2017-03-14: qty 50

## 2017-03-14 MED ORDER — FAMOTIDINE IN NACL 20-0.9 MG/50ML-% IV SOLN
INTRAVENOUS | Status: AC
  Filled 2017-03-14: qty 50

## 2017-03-14 MED ORDER — DIPHENHYDRAMINE HCL 25 MG PO CAPS
50.0000 mg | ORAL_CAPSULE | Freq: Once | ORAL | Status: AC
  Administered 2017-03-14: 50 mg via ORAL

## 2017-03-14 MED ORDER — METHYLPREDNISOLONE SODIUM SUCC 125 MG IJ SOLR
INTRAMUSCULAR | Status: AC
  Filled 2017-03-14: qty 2

## 2017-03-14 MED ORDER — DIPHENHYDRAMINE HCL 25 MG PO CAPS
ORAL_CAPSULE | ORAL | Status: AC
  Filled 2017-03-14: qty 2

## 2017-03-14 MED ORDER — HEPARIN SOD (PORK) LOCK FLUSH 100 UNIT/ML IV SOLN
500.0000 [IU] | Freq: Once | INTRAVENOUS | Status: DC | PRN
  Filled 2017-03-14: qty 5

## 2017-03-14 MED ORDER — ACETAMINOPHEN 325 MG PO TABS
ORAL_TABLET | ORAL | Status: AC
  Filled 2017-03-14: qty 2

## 2017-03-14 MED ORDER — ACETAMINOPHEN 325 MG PO TABS
650.0000 mg | ORAL_TABLET | Freq: Once | ORAL | Status: AC
  Administered 2017-03-14: 650 mg via ORAL

## 2017-03-14 MED ORDER — SODIUM CHLORIDE 0.9 % IV SOLN
Freq: Once | INTRAVENOUS | Status: AC
  Administered 2017-03-14: 08:00:00 via INTRAVENOUS

## 2017-03-14 MED ORDER — SODIUM CHLORIDE 0.9% FLUSH
10.0000 mL | INTRAVENOUS | Status: DC | PRN
  Filled 2017-03-14: qty 10

## 2017-03-14 NOTE — Patient Instructions (Signed)
[facility] Discharge Instructions for Patients   Today you received the following: rituximab (Rituxan).  To help prevent nausea and vomiting after your treatment, we encourage you to take your nausea medication as prescribed.   If you develop nausea and vomiting that is not controlled by your nausea medication, call the clinic.   BELOW ARE SYMPTOMS THAT SHOULD BE REPORTED IMMEDIATELY:  *FEVER GREATER THAN 100.5 F  *CHILLS WITH OR WITHOUT FEVER  NAUSEA AND VOMITING THAT IS NOT CONTROLLED WITH YOUR NAUSEA MEDICATION  *UNUSUAL SHORTNESS OF BREATH  *UNUSUAL BRUISING OR BLEEDING  TENDERNESS IN MOUTH AND THROAT WITH OR WITHOUT PRESENCE OF ULCERS  *URINARY PROBLEMS  *BOWEL PROBLEMS  UNUSUAL RASH Items with * indicate a potential emergency and should be followed up as soon as possible.  Feel free to call the clinic you have any questions or concerns. The clinic phone number is [phone].  Please show the [place] at check-in to the Emergency Department and triage nurse.

## 2017-03-14 NOTE — Telephone Encounter (Signed)
lvm to inform pt of 8/1 appt time change to 2 pm per sch msg. MD on call

## 2017-03-14 NOTE — Progress Notes (Signed)
OK to treat with creatinine 1.3 per Dr Alen Blew

## 2017-03-19 ENCOUNTER — Other Ambulatory Visit: Payer: Self-pay | Admitting: Urology

## 2017-03-19 DIAGNOSIS — D3000 Benign neoplasm of unspecified kidney: Secondary | ICD-10-CM

## 2017-03-20 ENCOUNTER — Other Ambulatory Visit: Payer: Medicare Other | Admitting: Internal Medicine

## 2017-03-22 ENCOUNTER — Other Ambulatory Visit (HOSPITAL_BASED_OUTPATIENT_CLINIC_OR_DEPARTMENT_OTHER): Payer: Medicare Other

## 2017-03-22 ENCOUNTER — Ambulatory Visit (HOSPITAL_BASED_OUTPATIENT_CLINIC_OR_DEPARTMENT_OTHER): Payer: Medicare Other

## 2017-03-22 VITALS — BP 112/67 | HR 81 | Temp 98.1°F | Resp 18

## 2017-03-22 DIAGNOSIS — Z5112 Encounter for antineoplastic immunotherapy: Secondary | ICD-10-CM | POA: Diagnosis not present

## 2017-03-22 DIAGNOSIS — L102 Pemphigus foliaceous: Secondary | ICD-10-CM

## 2017-03-22 DIAGNOSIS — L109 Pemphigus, unspecified: Secondary | ICD-10-CM

## 2017-03-22 LAB — CBC WITH DIFFERENTIAL/PLATELET
BASO%: 0.1 % (ref 0.0–2.0)
BASOS ABS: 0 10*3/uL (ref 0.0–0.1)
EOS ABS: 0.3 10*3/uL (ref 0.0–0.5)
EOS%: 3.1 % (ref 0.0–7.0)
HCT: 40 % (ref 34.8–46.6)
HGB: 13.1 g/dL (ref 11.6–15.9)
LYMPH%: 15.3 % (ref 14.0–49.7)
MCH: 32.5 pg (ref 25.1–34.0)
MCHC: 32.8 g/dL (ref 31.5–36.0)
MCV: 99.3 fL (ref 79.5–101.0)
MONO#: 0.8 10*3/uL (ref 0.1–0.9)
MONO%: 8.7 % (ref 0.0–14.0)
NEUT#: 6.5 10*3/uL (ref 1.5–6.5)
NEUT%: 72.8 % (ref 38.4–76.8)
Platelets: 183 10*3/uL (ref 145–400)
RBC: 4.03 10*6/uL (ref 3.70–5.45)
RDW: 13.7 % (ref 11.2–14.5)
WBC: 9 10*3/uL (ref 3.9–10.3)
lymph#: 1.4 10*3/uL (ref 0.9–3.3)

## 2017-03-22 LAB — COMPREHENSIVE METABOLIC PANEL
ALK PHOS: 87 U/L (ref 40–150)
ALT: 14 U/L (ref 0–55)
ANION GAP: 9 meq/L (ref 3–11)
AST: 14 U/L (ref 5–34)
Albumin: 2.9 g/dL — ABNORMAL LOW (ref 3.5–5.0)
BUN: 31.8 mg/dL — ABNORMAL HIGH (ref 7.0–26.0)
CO2: 23 meq/L (ref 22–29)
Calcium: 9.4 mg/dL (ref 8.4–10.4)
Chloride: 104 mEq/L (ref 98–109)
Creatinine: 1.3 mg/dL — ABNORMAL HIGH (ref 0.6–1.1)
EGFR: 40 mL/min/{1.73_m2} — AB (ref >90)
GLUCOSE: 518 mg/dL — AB (ref 70–140)
POTASSIUM: 4.5 meq/L (ref 3.5–5.1)
SODIUM: 136 meq/L (ref 136–145)
Total Bilirubin: 0.39 mg/dL (ref 0.20–1.20)
Total Protein: 6.7 g/dL (ref 6.4–8.3)

## 2017-03-22 MED ORDER — DIPHENHYDRAMINE HCL 25 MG PO CAPS
50.0000 mg | ORAL_CAPSULE | Freq: Once | ORAL | Status: AC
  Administered 2017-03-22: 50 mg via ORAL

## 2017-03-22 MED ORDER — ACETAMINOPHEN 325 MG PO TABS
650.0000 mg | ORAL_TABLET | Freq: Once | ORAL | Status: AC
  Administered 2017-03-22: 650 mg via ORAL

## 2017-03-22 MED ORDER — METHYLPREDNISOLONE SODIUM SUCC 40 MG IJ SOLR
INTRAMUSCULAR | Status: AC
  Filled 2017-03-22: qty 1

## 2017-03-22 MED ORDER — ACETAMINOPHEN 325 MG PO TABS
ORAL_TABLET | ORAL | Status: AC
  Filled 2017-03-22: qty 2

## 2017-03-22 MED ORDER — DIPHENHYDRAMINE HCL 25 MG PO CAPS
ORAL_CAPSULE | ORAL | Status: AC
  Filled 2017-03-22: qty 2

## 2017-03-22 MED ORDER — FAMOTIDINE IN NACL 20-0.9 MG/50ML-% IV SOLN
INTRAVENOUS | Status: AC
  Filled 2017-03-22: qty 50

## 2017-03-22 MED ORDER — METHYLPREDNISOLONE SODIUM SUCC 125 MG IJ SOLR
INTRAMUSCULAR | Status: AC
  Filled 2017-03-22: qty 2

## 2017-03-22 MED ORDER — SODIUM CHLORIDE 0.9 % IV SOLN
375.0000 mg/m2 | Freq: Once | INTRAVENOUS | Status: AC
  Administered 2017-03-22: 800 mg via INTRAVENOUS
  Filled 2017-03-22: qty 50

## 2017-03-22 MED ORDER — FAMOTIDINE IN NACL 20-0.9 MG/50ML-% IV SOLN
20.0000 mg | Freq: Two times a day (BID) | INTRAVENOUS | Status: DC
  Administered 2017-03-22: 20 mg via INTRAVENOUS

## 2017-03-22 MED ORDER — METHYLPREDNISOLONE SODIUM SUCC 125 MG IJ SOLR
125.0000 mg | Freq: Once | INTRAMUSCULAR | Status: AC
  Administered 2017-03-22: 125 mg via INTRAVENOUS

## 2017-03-22 MED ORDER — SODIUM CHLORIDE 0.9 % IV SOLN
Freq: Once | INTRAVENOUS | Status: AC
  Administered 2017-03-22: 11:00:00 via INTRAVENOUS

## 2017-03-22 NOTE — Patient Instructions (Signed)
[facility] Discharge Instructions for Patients Receiving Chemotherapy  Today you received the following chemotherapy agents Rituximab.   To help prevent nausea and vomiting after your treatment, we encourage you to take your nausea medication as prescribed.   If you develop nausea and vomiting that is not controlled by your nausea medication, call the clinic.   BELOW ARE SYMPTOMS THAT SHOULD BE REPORTED IMMEDIATELY:  *FEVER GREATER THAN 100.5 F  *CHILLS WITH OR WITHOUT FEVER  NAUSEA AND VOMITING THAT IS NOT CONTROLLED WITH YOUR NAUSEA MEDICATION  *UNUSUAL SHORTNESS OF BREATH  *UNUSUAL BRUISING OR BLEEDING  TENDERNESS IN MOUTH AND THROAT WITH OR WITHOUT PRESENCE OF ULCERS  *URINARY PROBLEMS  *BOWEL PROBLEMS  UNUSUAL RASH Items with * indicate a potential emergency and should be followed up as soon as possible.  Feel free to call the clinic you have any questions or concerns. The clinic phone number is [phone].  Please show the [place] at check-in to the Emergency Department and triage nurse.

## 2017-03-22 NOTE — Progress Notes (Signed)
Serum glucose over 500.  Pt states she has not taken her oral diabetes medication or insulin for 2 days because she was out of town.  Pt has her sliding scale insulin with her now and will self administer her insulin prior to start of treatment today.

## 2017-03-27 ENCOUNTER — Ambulatory Visit (INDEPENDENT_AMBULATORY_CARE_PROVIDER_SITE_OTHER): Payer: Medicare Other | Admitting: Internal Medicine

## 2017-03-27 ENCOUNTER — Encounter: Payer: Self-pay | Admitting: Internal Medicine

## 2017-03-27 VITALS — BP 102/68 | HR 84 | Temp 98.3°F

## 2017-03-27 DIAGNOSIS — A499 Bacterial infection, unspecified: Secondary | ICD-10-CM

## 2017-03-27 DIAGNOSIS — N390 Urinary tract infection, site not specified: Secondary | ICD-10-CM | POA: Diagnosis not present

## 2017-03-27 LAB — POCT URINALYSIS DIPSTICK
BILIRUBIN UA: NEGATIVE
Glucose, UA: 500
KETONES UA: NEGATIVE
Nitrite, UA: NEGATIVE
PH UA: 6 (ref 5.0–8.0)
Spec Grav, UA: 1.015 (ref 1.010–1.025)
Urobilinogen, UA: 0.2 E.U./dL

## 2017-03-27 NOTE — Patient Instructions (Addendum)
Abnormal Urinalysis, pending culture. Await results

## 2017-03-27 NOTE — Progress Notes (Signed)
Repeat U/A abnormal. Send for  Culture and await culture results.Was treated June 18 with 10 day course of Cipro.  Patient is aware of results and aware a culture is being sent. Lenise Arena, CMA

## 2017-03-29 ENCOUNTER — Other Ambulatory Visit (HOSPITAL_BASED_OUTPATIENT_CLINIC_OR_DEPARTMENT_OTHER): Payer: Medicare Other

## 2017-03-29 ENCOUNTER — Ambulatory Visit (HOSPITAL_BASED_OUTPATIENT_CLINIC_OR_DEPARTMENT_OTHER): Payer: Medicare Other

## 2017-03-29 VITALS — BP 104/68 | HR 97 | Temp 97.9°F | Resp 18

## 2017-03-29 DIAGNOSIS — L109 Pemphigus, unspecified: Secondary | ICD-10-CM

## 2017-03-29 DIAGNOSIS — Z5112 Encounter for antineoplastic immunotherapy: Secondary | ICD-10-CM | POA: Diagnosis not present

## 2017-03-29 DIAGNOSIS — L102 Pemphigus foliaceous: Secondary | ICD-10-CM

## 2017-03-29 LAB — COMPREHENSIVE METABOLIC PANEL
ALT: 15 U/L (ref 0–55)
AST: 15 U/L (ref 5–34)
Albumin: 2.9 g/dL — ABNORMAL LOW (ref 3.5–5.0)
Alkaline Phosphatase: 72 U/L (ref 40–150)
Anion Gap: 10 mEq/L (ref 3–11)
BUN: 34 mg/dL — AB (ref 7.0–26.0)
CALCIUM: 9.7 mg/dL (ref 8.4–10.4)
CHLORIDE: 106 meq/L (ref 98–109)
CO2: 21 mEq/L — ABNORMAL LOW (ref 22–29)
Creatinine: 1.4 mg/dL — ABNORMAL HIGH (ref 0.6–1.1)
EGFR: 37 mL/min/{1.73_m2} — ABNORMAL LOW (ref >90)
GLUCOSE: 389 mg/dL — AB (ref 70–140)
POTASSIUM: 4.3 meq/L (ref 3.5–5.1)
SODIUM: 137 meq/L (ref 136–145)
Total Bilirubin: 0.52 mg/dL (ref 0.20–1.20)
Total Protein: 6.5 g/dL (ref 6.4–8.3)

## 2017-03-29 LAB — CBC WITH DIFFERENTIAL/PLATELET
BASO%: 0.1 % (ref 0.0–2.0)
BASOS ABS: 0 10*3/uL (ref 0.0–0.1)
EOS%: 3.5 % (ref 0.0–7.0)
Eosinophils Absolute: 0.3 10*3/uL (ref 0.0–0.5)
HCT: 41.9 % (ref 34.8–46.6)
HGB: 13.7 g/dL (ref 11.6–15.9)
LYMPH%: 20.2 % (ref 14.0–49.7)
MCH: 32.5 pg (ref 25.1–34.0)
MCHC: 32.7 g/dL (ref 31.5–36.0)
MCV: 99.3 fL (ref 79.5–101.0)
MONO#: 0.8 10*3/uL (ref 0.1–0.9)
MONO%: 8.6 % (ref 0.0–14.0)
NEUT#: 6.5 10*3/uL (ref 1.5–6.5)
NEUT%: 67.6 % (ref 38.4–76.8)
Platelets: 188 10*3/uL (ref 145–400)
RBC: 4.22 10*6/uL (ref 3.70–5.45)
RDW: 13.9 % (ref 11.2–14.5)
WBC: 9.7 10*3/uL (ref 3.9–10.3)
lymph#: 2 10*3/uL (ref 0.9–3.3)

## 2017-03-29 MED ORDER — SODIUM CHLORIDE 0.9 % IV SOLN
375.0000 mg/m2 | Freq: Once | INTRAVENOUS | Status: AC
  Administered 2017-03-29: 800 mg via INTRAVENOUS
  Filled 2017-03-29: qty 30

## 2017-03-29 MED ORDER — METHYLPREDNISOLONE SODIUM SUCC 125 MG IJ SOLR
INTRAMUSCULAR | Status: AC
  Filled 2017-03-29: qty 2

## 2017-03-29 MED ORDER — FAMOTIDINE IN NACL 20-0.9 MG/50ML-% IV SOLN
20.0000 mg | Freq: Two times a day (BID) | INTRAVENOUS | Status: DC
  Administered 2017-03-29: 20 mg via INTRAVENOUS

## 2017-03-29 MED ORDER — METHYLPREDNISOLONE SODIUM SUCC 125 MG IJ SOLR
125.0000 mg | Freq: Once | INTRAMUSCULAR | Status: AC
  Administered 2017-03-29: 125 mg via INTRAVENOUS

## 2017-03-29 MED ORDER — FAMOTIDINE IN NACL 20-0.9 MG/50ML-% IV SOLN
INTRAVENOUS | Status: AC
  Filled 2017-03-29: qty 50

## 2017-03-29 MED ORDER — ACETAMINOPHEN 325 MG PO TABS
ORAL_TABLET | ORAL | Status: AC
  Filled 2017-03-29: qty 2

## 2017-03-29 MED ORDER — ACETAMINOPHEN 325 MG PO TABS
650.0000 mg | ORAL_TABLET | Freq: Once | ORAL | Status: AC
  Administered 2017-03-29: 650 mg via ORAL

## 2017-03-29 MED ORDER — DIPHENHYDRAMINE HCL 25 MG PO CAPS
ORAL_CAPSULE | ORAL | Status: AC
  Filled 2017-03-29: qty 2

## 2017-03-29 MED ORDER — SODIUM CHLORIDE 0.9 % IV SOLN
Freq: Once | INTRAVENOUS | Status: AC
  Administered 2017-03-29: 09:00:00 via INTRAVENOUS

## 2017-03-29 MED ORDER — DIPHENHYDRAMINE HCL 25 MG PO CAPS
50.0000 mg | ORAL_CAPSULE | Freq: Once | ORAL | Status: AC
  Administered 2017-03-29: 50 mg via ORAL

## 2017-03-29 NOTE — Patient Instructions (Signed)
[facility] Discharge Instructions for Patients Receiving Chemotherapy  Today you received the following chemotherapy agents Rituxan To help prevent nausea and vomiting after your treatment, we encourage you to take your nausea medication as prescribed.  If you develop nausea and vomiting that is not controlled by your nausea medication, call the clinic.   BELOW ARE SYMPTOMS THAT SHOULD BE REPORTED IMMEDIATELY:  *FEVER GREATER THAN 100.5 F  *CHILLS WITH OR WITHOUT FEVER  NAUSEA AND VOMITING THAT IS NOT CONTROLLED WITH YOUR NAUSEA MEDICATION  *UNUSUAL SHORTNESS OF BREATH  *UNUSUAL BRUISING OR BLEEDING  TENDERNESS IN MOUTH AND THROAT WITH OR WITHOUT PRESENCE OF ULCERS  *URINARY PROBLEMS  *BOWEL PROBLEMS  UNUSUAL RASH Items with * indicate a potential emergency and should be followed up as soon as possible.  Feel free to call the clinic you have any questions or concerns. The clinic phone number is [phone].  Please show the CHEMO ALERT CARD at check-in to the Emergency Department and triage nurse.   

## 2017-03-30 ENCOUNTER — Telehealth: Payer: Self-pay

## 2017-03-30 LAB — URINE CULTURE

## 2017-03-30 MED ORDER — AMOXICILLIN-POT CLAVULANATE 500-125 MG PO TABS: 1.0000 | ORAL_TABLET | Freq: Three times a day (TID) | ORAL | 0 refills | Status: DC

## 2017-03-30 NOTE — Telephone Encounter (Signed)
-----   Message from Elby Showers, MD sent at 03/30/2017  9:56 AM EDT ----- She has a diiferent organism in urine this time. Have her take Augmentin 500 mg 3 times a day x 10 days and followup once again as before with nurse visit and urine specimen. Make sure she is getting clean catch specimens. Prefer not to use a hat.

## 2017-03-30 NOTE — Telephone Encounter (Signed)
Sent in medication and appt made

## 2017-04-07 ENCOUNTER — Other Ambulatory Visit: Payer: Self-pay | Admitting: Internal Medicine

## 2017-04-10 ENCOUNTER — Ambulatory Visit: Payer: Medicare Other | Admitting: Internal Medicine

## 2017-04-12 ENCOUNTER — Ambulatory Visit: Payer: Medicare Other | Admitting: Internal Medicine

## 2017-04-13 ENCOUNTER — Ambulatory Visit (INDEPENDENT_AMBULATORY_CARE_PROVIDER_SITE_OTHER): Payer: Medicare Other | Admitting: Internal Medicine

## 2017-04-13 ENCOUNTER — Encounter: Payer: Self-pay | Admitting: Internal Medicine

## 2017-04-13 VITALS — BP 104/66 | HR 99 | Temp 97.4°F

## 2017-04-13 DIAGNOSIS — N390 Urinary tract infection, site not specified: Secondary | ICD-10-CM | POA: Diagnosis not present

## 2017-04-13 LAB — POCT URINALYSIS DIPSTICK
Bilirubin, UA: NEGATIVE
Glucose, UA: 100
KETONES UA: NEGATIVE
NITRITE UA: NEGATIVE
PH UA: 5 (ref 5.0–8.0)
PROTEIN UA: NEGATIVE
Spec Grav, UA: 1.02 (ref 1.010–1.025)
Urobilinogen, UA: 0.2 E.U./dL

## 2017-04-13 MED ORDER — FLUCONAZOLE 150 MG PO TABS: 150.0000 mg | ORAL_TABLET | Freq: Once | ORAL | 0 refills | Status: AC

## 2017-04-13 NOTE — Progress Notes (Signed)
   Subjective:    Patient ID: Julie Mora, female    DOB: [birth year], 76 y.o.   MRN: [id number]  HPI Nurse visit to follow-up on UTI. Still has abnormal urine. Culture sent once again. We are not usually able to get a clean-catch urine right when she finishes her antibiotic. She will postpone her appointments for a couple of days. I think she gets reinfected. She has seen urologist in the past regarding this. I want her to go back to urologist. We will notify her urine culture results next week.    Review of Systems     Objective:   Physical Exam  Seen by nurse-- see u/a results      Assessment & Plan:  Persistent/recurrent UTI  Plan: Culture sent. Refer back to urologist

## 2017-04-13 NOTE — Patient Instructions (Addendum)
Await culture results. Go back to see urologist regarding persistent and recurrent urinary infections. We will notify you of results next week.

## 2017-04-14 LAB — URINE CULTURE

## 2017-04-16 ENCOUNTER — Telehealth: Payer: Self-pay | Admitting: Oncology

## 2017-04-16 NOTE — Telephone Encounter (Signed)
Call day - moved 8/1 f/u to 8/7. Left message for patient

## 2017-04-18 ENCOUNTER — Ambulatory Visit: Payer: No Typology Code available for payment source | Admitting: Oncology

## 2017-04-19 DIAGNOSIS — N183 Chronic kidney disease, stage 3 (moderate): Secondary | ICD-10-CM | POA: Diagnosis not present

## 2017-04-19 DIAGNOSIS — E785 Hyperlipidemia, unspecified: Secondary | ICD-10-CM | POA: Diagnosis not present

## 2017-04-19 DIAGNOSIS — J45901 Unspecified asthma with (acute) exacerbation: Secondary | ICD-10-CM | POA: Diagnosis not present

## 2017-04-19 DIAGNOSIS — R262 Difficulty in walking, not elsewhere classified: Secondary | ICD-10-CM | POA: Diagnosis not present

## 2017-04-19 DIAGNOSIS — R279 Unspecified lack of coordination: Secondary | ICD-10-CM | POA: Diagnosis not present

## 2017-04-19 DIAGNOSIS — I872 Venous insufficiency (chronic) (peripheral): Secondary | ICD-10-CM | POA: Diagnosis not present

## 2017-04-19 DIAGNOSIS — I129 Hypertensive chronic kidney disease with stage 1 through stage 4 chronic kidney disease, or unspecified chronic kidney disease: Secondary | ICD-10-CM | POA: Diagnosis not present

## 2017-04-19 DIAGNOSIS — E1122 Type 2 diabetes mellitus with diabetic chronic kidney disease: Secondary | ICD-10-CM | POA: Diagnosis not present

## 2017-04-19 DIAGNOSIS — M6281 Muscle weakness (generalized): Secondary | ICD-10-CM | POA: Diagnosis not present

## 2017-04-23 ENCOUNTER — Encounter: Payer: Self-pay | Admitting: Internal Medicine

## 2017-04-23 ENCOUNTER — Ambulatory Visit (INDEPENDENT_AMBULATORY_CARE_PROVIDER_SITE_OTHER): Payer: Medicare Other | Admitting: Internal Medicine

## 2017-04-23 VITALS — BP 110/84 | HR 97 | Temp 98.1°F

## 2017-04-23 DIAGNOSIS — N390 Urinary tract infection, site not specified: Secondary | ICD-10-CM

## 2017-04-23 LAB — POCT URINALYSIS DIPSTICK
Bilirubin, UA: NEGATIVE
Glucose, UA: NEGATIVE
KETONES UA: NEGATIVE
PROTEIN UA: NEGATIVE
SPEC GRAV UA: 1.015 (ref 1.010–1.025)
Urobilinogen, UA: 0.2 E.U./dL
pH, UA: 6 (ref 5.0–8.0)

## 2017-04-23 NOTE — Progress Notes (Signed)
Repeat urine obtained today, no "hat" used. Abnormal results, sending for culture.  Lenise Arena, CMA

## 2017-04-23 NOTE — Patient Instructions (Signed)
Sending urine for culture.

## 2017-04-24 ENCOUNTER — Ambulatory Visit (HOSPITAL_BASED_OUTPATIENT_CLINIC_OR_DEPARTMENT_OTHER): Payer: Medicare Other | Admitting: Oncology

## 2017-04-24 VITALS — BP 100/50 | HR 86 | Temp 98.7°F | Resp 17 | Ht 64.0 in | Wt 247.2 lb

## 2017-04-24 DIAGNOSIS — L109 Pemphigus, unspecified: Secondary | ICD-10-CM

## 2017-04-24 DIAGNOSIS — L102 Pemphigus foliaceous: Secondary | ICD-10-CM | POA: Diagnosis not present

## 2017-04-24 LAB — URINE CULTURE

## 2017-04-24 NOTE — Progress Notes (Signed)
Hematology and Oncology Follow Up Visit  Julie Mora [id number] March 24, 1941 76 y.o. 04/24/2017 1:47 PM  Principle Diagnosis: 76 year old woman with history of recurrent pemphigus foliaceous diagnosed in 2010.  Prior Therapy:   She is status post Rituxan treatment with 375 mg per meter square intravenously on a weekly basis for 4 weeks completed in 2012. This was repeated in 2014 and achieved complete response after each treatment.  Rituxan with 375 mg weekly started on 01/17/2017. Therapy interrupted because of recent hospitalization and pneumonia. She completed a total of 4 treatments on 03/29/2017.   Current therapy: Observation and surveillance.  Interim History:  Julie Mora presents today for a followup visit. She completed rituximab infusion for a total of 4 treatments completed in July 2018. She did develop an infusion-related reaction after the first infusion but none in the subsequent surgery. Her skin lesions appears to have resolved for the most part. She denied any delayed complications at this time. She denied any recent hospitalizations or illnesses. She has fully recovered from pneumonia in May 2018.  She is not report any headaches, blurry vision, syncope or seizures. She does not report any fevers, chills or sweats. She does not report any cough, wheezing or hemoptysis. She does not report any nausea, vomiting or abdominal pain. She is not reporting frequency urgency or hesitancy. She does not report any skeletal complaints. Remaining review of systems unremarkable.  Medications: I have reviewed the patient's current medications.  Current Outpatient Prescriptions  Medication Sig Dispense Refill  . ACCU-CHEK AVIVA PLUS test strip USE TO CHECK BLOOD SUGAR BEFORE BREAKFAST AND SUPPER 50 each prn  . acetaminophen (TYLENOL) 500 MG tablet Take 500 mg by mouth every 6 (six) hours as needed for pain.    Marland Kitchen alendronate (FOSAMAX) 70 MG tablet TAKE 1 TABLET(70 MG) BY MOUTH EVERY 7 DAYS  WITH A FULL GLASS OF WATER AND ON AN EMPTY STOMACH 4 tablet 0  . amoxicillin-clavulanate (AUGMENTIN) 500-125 MG tablet Take 1 tablet (500 mg total) by mouth 3 (three) times daily. 30 tablet 0  . BD PEN NEEDLE NANO U/F 32G X 4 MM MISC USE AS DIRECTED 100 each prn  . CALCIUM ASCORBATE PO Take 1 tablet by mouth daily.    . Cholecalciferol (VITAMIN D) 2000 UNITS tablet Take 2,000 Units by mouth daily.      . ciprofloxacin (CIPRO) 250 MG tablet Take 1 tablet (250 mg total) by mouth 2 (two) times daily. 20 tablet 0  . ferrous gluconate (FERGON) 324 MG tablet Take 1 tablet (324 mg total) by mouth daily with breakfast. 90 tablet 3  . ferrous gluconate (FERGON) 325 MG tablet Take 1 tablet (325 mg total) by mouth daily with breakfast. 30 tablet 0  . gabapentin (NEURONTIN) 300 MG capsule TK ONE C PO HS  11  . insulin aspart (NOVOLOG) 100 UNIT/ML injection Inject 5-12 Units into the skin 2 (two) times daily as needed for high blood sugar. 3 vial PRN  . Insulin Glargine (LANTUS SOLOSTAR) 100 UNIT/ML Solostar Pen Inject 35 units in skin at bedtime 15 mL prn  . Lactobacillus Rhamnosus, GG, (CULTURELLE PO) Take 1 capsule by mouth daily.    . multivitamin (THERAGRAN) per tablet Take 1 tablet by mouth daily.      Marland Kitchen nystatin cream (MYCOSTATIN) Apply 1 application topically 2 (two) times daily. 30 g 0  . Polyethyl Glycol-Propyl Glycol (SYSTANE) 0.4-0.3 % SOLN Apply 1 drop to eye 3 (three) times daily. Both eyes, as needed    .  potassium chloride (K-DUR) 10 MEQ tablet Take 1 tablet (10 mEq total) by mouth daily. 90 tablet 3  . PRADAXA 75 MG CAPS capsule TAKE 1 CAPSULE(75 MG) BY MOUTH TWICE DAILY 60 capsule 2  . prednisoLONE acetate (PRED FORTE) 1 % ophthalmic suspension Place 1 drop into both eyes 2 (two) times daily.     . simvastatin (ZOCOR) 20 MG tablet TAKE 1 TABLET BY MOUTH EVERY NIGHT AT BEDTIME 30 tablet 2  . torsemide (DEMADEX) 20 MG tablet Take 1 tablet (20 mg total) by mouth daily. 90 tablet 3  .  triamcinolone cream (KENALOG) 0.1 % Apply 1 application topically daily.  5   No current facility-administered medications for this visit.    Facility-Administered Medications Ordered in Other Visits  Medication Dose Route Frequency Provider Last Rate Last Dose  . famotidine (PEPCID) IVPB 20 mg premix  20 mg Intravenous Q12H Wyatt Portela, MD   20 mg at 03/29/17 0944     Allergies:  Allergies  Allergen Reactions  . Azathioprine Shortness Of Breath  . Cellcept [Mycophenolate Mofetil] Nausea Only    severe nausea  . Ramipril Cough    Past Medical History, Surgical history, Social history, and Family History were reviewed and updated.  Review of Systems:  Remaining ROS negative. Physical Exam: Blood pressure (!) 100/50, pulse 86, temperature 98.7 F (37.1 C), temperature source Oral, resp. rate 17, height 5\' 4"  (1.626 m), weight 247 lb 3.2 oz (112.1 kg), SpO2 97 %. ECOG:  1 General appearance: Well-appearing woman the period without distress. Head: Normocephalic, without obvious abnormality. No oral thrush or ulcers. Neck: No thyroid masses. Lymph nodes: Cervical, supraclavicular, and axillary nodes normal. Heart:regular rate and rhythm, S1, S2 normal, no murmur, click, rub or gallop Lung:chest clear, no wheezing, rales, normal symmetric air entry Abdomin: soft, non-tender, without masses or organomegaly EXT:no erythema, induration, or nodules SKIN: Very faint pemphigus lesions appeared well healed on her breasts, back and legs.  Lab Results: Lab Results  Component Value Date   WBC 9.7 03/29/2017   HGB 13.7 03/29/2017   HCT 41.9 03/29/2017   MCV 99.3 03/29/2017   PLT 188 03/29/2017     Chemistry      Component Value Date/Time   NA 137 03/29/2017 0840   K 4.3 03/29/2017 0840   CL 111 01/21/2017 0557   CO2 21 (L) 03/29/2017 0840   BUN 34.0 (H) 03/29/2017 0840   CREATININE 1.4 (H) 03/29/2017 0840      Component Value Date/Time   CALCIUM 9.7 03/29/2017 0840    ALKPHOS 72 03/29/2017 0840   AST 15 03/29/2017 0840   ALT 15 03/29/2017 0840   BILITOT 0.52 03/29/2017 0840       Impression and Plan:  76 year old woman with:  1. Pemphigus foliaceous. She is status post Rituxan treatment in 2012 and recent retreatment that was completed on 06/06/2013. She tolerated treatments very well and have been in remission since that time.  She developed recurrent disease in April 2018 and I have been requested by her dermatologist to restart systemic treatment.   She received the first of 4 weekly infusion on 01/17/2017 with therapy interrupted because of recent hospitalization and illness.  She completed the last 3 of rituximab infusions in July 2018.  Clinically she appears to have improved with near resolution of these lesions. I urged her to follow up with dermatology regarding this issue moving forward. She might require another course of rituximab in the future if she develops any  relapse.   2. Follow-up: Will be as needed moving forward.       Zola Button, MD 8/7/20181:47 PM

## 2017-05-01 ENCOUNTER — Ambulatory Visit
Admission: RE | Admit: 2017-05-01 | Discharge: 2017-05-01 | Disposition: A | Discharge status: Home / Self Care | Payer: No Typology Code available for payment source | Source: Ambulatory Visit | Attending: Urology | Admitting: Urology

## 2017-05-01 DIAGNOSIS — D3000 Benign neoplasm of unspecified kidney: Secondary | ICD-10-CM

## 2017-05-01 MED ORDER — GADOBENATE DIMEGLUMINE 529 MG/ML IV SOLN
20.0000 mL | Freq: Once | INTRAVENOUS | Status: AC | PRN
  Administered 2017-05-01: 20 mL via INTRAVENOUS

## 2017-05-02 ENCOUNTER — Other Ambulatory Visit: Payer: Self-pay | Admitting: Internal Medicine

## 2017-05-05 ENCOUNTER — Other Ambulatory Visit: Payer: Self-pay | Admitting: Internal Medicine

## 2017-05-31 ENCOUNTER — Telehealth: Payer: Self-pay | Admitting: Internal Medicine

## 2017-05-31 ENCOUNTER — Encounter: Payer: Self-pay | Admitting: Internal Medicine

## 2017-05-31 NOTE — Telephone Encounter (Signed)
We called to ask pt to return for another Hgb AIC but sh is refusing saying she will see Dr. Chalmers Cater in October

## 2017-06-06 ENCOUNTER — Inpatient Hospital Stay (HOSPITAL_COMMUNITY)
Admission: EM | Admit: 2017-06-06 | Discharge: 2017-06-12 | DRG: 418 | Disposition: A | Discharge status: Home / Self Care | Payer: Medicare Other | Attending: Internal Medicine | Admitting: Internal Medicine

## 2017-06-06 ENCOUNTER — Encounter (HOSPITAL_COMMUNITY): Payer: Self-pay | Admitting: Emergency Medicine

## 2017-06-06 DIAGNOSIS — Z8673 Personal history of transient ischemic attack (TIA), and cerebral infarction without residual deficits: Secondary | ICD-10-CM

## 2017-06-06 DIAGNOSIS — Z7983 Long term (current) use of bisphosphonates: Secondary | ICD-10-CM

## 2017-06-06 DIAGNOSIS — R945 Abnormal results of liver function studies: Secondary | ICD-10-CM | POA: Diagnosis present

## 2017-06-06 DIAGNOSIS — D649 Anemia, unspecified: Secondary | ICD-10-CM | POA: Diagnosis present

## 2017-06-06 DIAGNOSIS — K851 Biliary acute pancreatitis without necrosis or infection: Secondary | ICD-10-CM | POA: Diagnosis not present

## 2017-06-06 DIAGNOSIS — K862 Cyst of pancreas: Secondary | ICD-10-CM | POA: Diagnosis present

## 2017-06-06 DIAGNOSIS — N3000 Acute cystitis without hematuria: Secondary | ICD-10-CM

## 2017-06-06 DIAGNOSIS — R509 Fever, unspecified: Secondary | ICD-10-CM

## 2017-06-06 DIAGNOSIS — Z794 Long term (current) use of insulin: Secondary | ICD-10-CM

## 2017-06-06 DIAGNOSIS — Z7901 Long term (current) use of anticoagulants: Secondary | ICD-10-CM

## 2017-06-06 DIAGNOSIS — K760 Fatty (change of) liver, not elsewhere classified: Secondary | ICD-10-CM | POA: Diagnosis present

## 2017-06-06 DIAGNOSIS — Z6841 Body Mass Index (BMI) 40.0 and over, adult: Secondary | ICD-10-CM

## 2017-06-06 DIAGNOSIS — K8064 Calculus of gallbladder and bile duct with chronic cholecystitis without obstruction: Secondary | ICD-10-CM | POA: Diagnosis present

## 2017-06-06 DIAGNOSIS — I5032 Chronic diastolic (congestive) heart failure: Secondary | ICD-10-CM | POA: Diagnosis present

## 2017-06-06 DIAGNOSIS — E559 Vitamin D deficiency, unspecified: Secondary | ICD-10-CM | POA: Diagnosis present

## 2017-06-06 DIAGNOSIS — I110 Hypertensive heart disease with heart failure: Secondary | ICD-10-CM | POA: Diagnosis present

## 2017-06-06 DIAGNOSIS — J9811 Atelectasis: Secondary | ICD-10-CM | POA: Diagnosis not present

## 2017-06-06 DIAGNOSIS — R32 Unspecified urinary incontinence: Secondary | ICD-10-CM | POA: Diagnosis present

## 2017-06-06 DIAGNOSIS — I10 Essential (primary) hypertension: Secondary | ICD-10-CM | POA: Diagnosis present

## 2017-06-06 DIAGNOSIS — N390 Urinary tract infection, site not specified: Secondary | ICD-10-CM | POA: Diagnosis present

## 2017-06-06 DIAGNOSIS — E875 Hyperkalemia: Secondary | ICD-10-CM | POA: Diagnosis present

## 2017-06-06 DIAGNOSIS — E1165 Type 2 diabetes mellitus with hyperglycemia: Secondary | ICD-10-CM | POA: Diagnosis present

## 2017-06-06 DIAGNOSIS — R6510 Systemic inflammatory response syndrome (SIRS) of non-infectious origin without acute organ dysfunction: Secondary | ICD-10-CM | POA: Diagnosis present

## 2017-06-06 DIAGNOSIS — R933 Abnormal findings on diagnostic imaging of other parts of digestive tract: Secondary | ICD-10-CM

## 2017-06-06 DIAGNOSIS — R7989 Other specified abnormal findings of blood chemistry: Secondary | ICD-10-CM | POA: Diagnosis present

## 2017-06-06 DIAGNOSIS — Z888 Allergy status to other drugs, medicaments and biological substances status: Secondary | ICD-10-CM

## 2017-06-06 DIAGNOSIS — Z79899 Other long term (current) drug therapy: Secondary | ICD-10-CM

## 2017-06-06 DIAGNOSIS — Z792 Long term (current) use of antibiotics: Secondary | ICD-10-CM

## 2017-06-06 DIAGNOSIS — K8050 Calculus of bile duct without cholangitis or cholecystitis without obstruction: Secondary | ICD-10-CM | POA: Diagnosis not present

## 2017-06-06 DIAGNOSIS — E7800 Pure hypercholesterolemia, unspecified: Secondary | ICD-10-CM | POA: Diagnosis present

## 2017-06-06 DIAGNOSIS — L102 Pemphigus foliaceous: Secondary | ICD-10-CM | POA: Diagnosis present

## 2017-06-06 DIAGNOSIS — A419 Sepsis, unspecified organism: Secondary | ICD-10-CM | POA: Diagnosis present

## 2017-06-06 DIAGNOSIS — Z86718 Personal history of other venous thrombosis and embolism: Secondary | ICD-10-CM

## 2017-06-06 LAB — CBC
HCT: 46.2 % — ABNORMAL HIGH (ref 36.0–46.0)
HEMOGLOBIN: 15.6 g/dL — AB (ref 12.0–15.0)
MCH: 32.8 pg (ref 26.0–34.0)
MCHC: 33.8 g/dL (ref 30.0–36.0)
MCV: 97.1 fL (ref 78.0–100.0)
Platelets: 171 10*3/uL (ref 150–400)
RBC: 4.76 MIL/uL (ref 3.87–5.11)
RDW: 12.8 % (ref 11.5–15.5)
WBC: 15.5 10*3/uL — ABNORMAL HIGH (ref 4.0–10.5)

## 2017-06-06 LAB — I-STAT TROPONIN, ED: Troponin i, poc: 0.01 ng/mL (ref 0.00–0.08)

## 2017-06-06 LAB — CBG MONITORING, ED: GLUCOSE-CAPILLARY: 312 mg/dL — AB (ref 65–99)

## 2017-06-06 MED ORDER — SODIUM CHLORIDE 0.9 % IV BOLUS (SEPSIS): 1000.0000 mL | Freq: Once | INTRAVENOUS | Status: DC

## 2017-06-06 MED ORDER — SODIUM CHLORIDE 0.9 % IV BOLUS (SEPSIS)
2000.0000 mL | Freq: Once | INTRAVENOUS | Status: AC
  Administered 2017-06-06: 2000 mL via INTRAVENOUS

## 2017-06-06 MED ORDER — ONDANSETRON 4 MG PO TBDP: 4.0000 mg | ORAL_TABLET | Freq: Once | ORAL | Status: DC | PRN

## 2017-06-06 MED ORDER — ONDANSETRON HCL 4 MG/2ML IJ SOLN
4.0000 mg | Freq: Once | INTRAMUSCULAR | Status: AC
  Administered 2017-06-06: 4 mg via INTRAVENOUS
  Filled 2017-06-06: qty 2

## 2017-06-06 NOTE — ED Triage Notes (Signed)
Per [facility] patient comes from home c/o abd pain with n/v x 3 days. Patient also c/o left shoulder pain x week and will subside after taking Tylenol.  Patient given Zofran 4mg  ODT with EMS. Patient has 20g in left hand.  CBG 367, 142/80, 88HR, 95% on room air, 18R.

## 2017-06-06 NOTE — ED Provider Notes (Signed)
**Note De-Identified Julie Obfuscation** [facility] DEPT Provider Note   CSN: [id number] Arrival date & time: 06/06/17  1512     History   Chief Complaint Chief Complaint  Patient presents with  . Vomiting    HPI Julie Mora is a 76 y.o. female.  76 year old female with a history of diabetes, hypertension, renal insufficiency presents to the emergency department for nausea and vomiting. Symptoms have been persistent over the past week, worsening over the last 3 days. Patient unable to tolerate food or fluids by mouth 2/2 emesis. She has had associated lower abdominal pain as well. She attributes this pain to dehydration and frequent vomiting. Patient denies any known modifying factors of her symptoms. She denies any sick contacts. No associated fevers. The patient has been having normal bowel movements. No melena, hematochezia, dysuria, hematuria. No hx of abdominal surgeries.   The history is provided by the patient. No language interpreter was used.    Past Medical History:  Diagnosis Date  . Anemia   . Essential hypertension, benign   . Incontinence   . Other and unspecified hyperlipidemia   . Pemphigus foliaceous   . Renal insufficiency   . Type II or unspecified type diabetes mellitus without mention of complication, not stated as uncontrolled   . Vitamin D deficiency     Patient Active Problem List   Diagnosis Date Noted  . Pancreatitis, acute 06/07/2017  . Pancreatic cyst 06/07/2017  . Hyperkalemia 06/07/2017  . Abnormal LFTs 06/07/2017  . UTI (urinary tract infection) 06/07/2017  . Acute pancreatitis 06/07/2017  . Morbid obesity ([place]) 03/02/2017  . Poorly controlled diabetes mellitus ([place]) 03/02/2017  . Stasis dermatitis of both legs 03/02/2017  . Sepsis ([place]) 01/19/2017  . Aspiration pneumonia ([place]) 01/19/2017  . Osteoporosis 12/15/2014  . Metabolic syndrome 68/08/7516  . History of deep venous thrombosis 04/19/2011  . Vitamin D deficiency 04/19/2011  . History of iron deficiency  04/19/2011  . Dependent edema 04/19/2011  . Mixed stress and urge urinary incontinence 04/19/2011  . CEREBROVASCULAR ACCIDENT, ACUTE 06/25/2009  . PEMPHIGUS FOLIACEOUS 06/25/2009  . HYPERCHOLESTEROLEMIA 05/28/2009  . Essential hypertension 05/28/2009    No past surgical history on file.  OB History    No data available       Home Medications    Prior to Admission medications   Medication Sig Start Date End Date Taking? Authorizing Provider  acetaminophen (TYLENOL) 500 MG tablet Take 500 mg by mouth every 6 (six) hours as needed for pain.   Yes [provider]  alendronate (FOSAMAX) 70 MG tablet TAKE 1 TABLET(70 MG) BY MOUTH EVERY 7 DAYS WITH A FULL GLASS OF WATER AND ON AN EMPTY STOMACH 05/05/17  Yes Baxley, Cresenciano Lick, MD  CALCIUM ASCORBATE PO Take 1 tablet by mouth daily.   Yes [provider]  Cholecalciferol (VITAMIN D) 2000 UNITS tablet Take 2,000 Units by mouth daily.     Yes [provider]  ferrous gluconate (FERGON) 324 MG tablet Take 1 tablet (324 mg total) by mouth daily with breakfast. 03/02/17  Yes Baxley, Cresenciano Lick, MD  gabapentin (NEURONTIN) 300 MG capsule take 1 capsule at bedtime 06/23/15  Yes [provider]  insulin aspart (NOVOLOG) 100 UNIT/ML injection Inject 5-12 Units into the skin 2 (two) times daily as needed for high blood sugar. Patient taking differently: Inject 4-14 Units into the skin 3 (three) times daily with meals.  12/15/14  Yes Baxley, Cresenciano Lick, MD  Insulin Glargine (LANTUS SOLOSTAR) 100 UNIT/ML Solostar Pen Inject 35 units  in skin at bedtime Patient taking differently: Inject 20-45 Units into the skin 2 (two) times daily. 20 units in the AM and  45 units in the PM 01/01/14  Yes Baxley, Cresenciano Lick, MD  Lactobacillus Rhamnosus, GG, (CULTURELLE PO) Take 1 capsule by mouth daily.   Yes [provider]  multivitamin [facility]) per tablet Take 1 tablet by mouth daily.     Yes [provider]  nystatin cream  (MYCOSTATIN) Apply 1 application topically 2 (two) times daily. 03/02/17  Yes Baxley, Cresenciano Lick, MD  Polyethyl Glycol-Propyl Glycol (SYSTANE) 0.4-0.3 % SOLN Apply 1 drop to eye 3 (three) times daily. Both eyes, as needed   Yes [provider]  potassium chloride (K-DUR) 10 MEQ tablet Take 1 tablet (10 mEq total) by mouth daily. 03/02/17  Yes Baxley, Cresenciano Lick, MD  PRADAXA 75 MG CAPS capsule TAKE 1 CAPSULE(75 MG) BY MOUTH TWICE DAILY 04/07/17  Yes Baxley, Cresenciano Lick, MD  prednisoLONE acetate (PRED FORTE) 1 % ophthalmic suspension Place 1 drop into both eyes 2 (two) times daily.  05/08/12  Yes [provider]  simvastatin (ZOCOR) 20 MG tablet TAKE 1 TABLET BY MOUTH EVERY NIGHT AT BEDTIME 01/09/17  Yes Baxley, Cresenciano Lick, MD  torsemide (DEMADEX) 20 MG tablet Take 1 tablet (20 mg total) by mouth daily. 03/02/17  Yes Baxley, Cresenciano Lick, MD  ACCU-CHEK AVIVA PLUS test strip USE TO CHECK BLOOD SUGAR BEFORE BREAKFAST AND SUPPER 11/09/14   Elby Showers, MD  amoxicillin-clavulanate (AUGMENTIN) 500-125 MG tablet Take 1 tablet (500 mg total) by mouth 3 (three) times daily. Patient not taking: Reported on 06/07/2017 03/30/17   Elby Showers, MD  BD PEN NEEDLE NANO U/F 32G X 4 MM MISC USE AS DIRECTED 01/11/15   Elby Showers, MD  ciprofloxacin (CIPRO) 250 MG tablet Take 1 tablet (250 mg total) by mouth 2 (two) times daily. Patient not taking: Reported on 06/07/2017 03/09/17   Elby Showers, MD  ferrous gluconate (FERGON) 325 MG tablet Take 1 tablet (325 mg total) by mouth daily with breakfast. Patient not taking: Reported on 06/07/2017 01/22/17   Florencia Reasons, MD    Family History Family History  Problem Relation Age of Onset  . Cancer Father     Social History Social History  Substance Use Topics  . Smoking status: Never Smoker  . Smokeless tobacco: Never Used  . Alcohol use No     Allergies   Azathioprine; Cellcept [mycophenolate mofetil]; and Ramipril   Review of Systems Review of Systems Ten systems  reviewed and are negative for acute change, except as noted in the HPI.    Physical Exam Updated Vital Signs BP (!) 142/83   Pulse 95   Temp 98.8 F (37.1 C) (Oral)   Resp 19   LMP  (LMP Unknown)   SpO2 92%   Physical Exam  Constitutional: She is oriented to person, place, and time. She appears well-developed and well-nourished. No distress.  Nontoxic appearing and in NAD  HENT:  Head: Normocephalic and atraumatic.  Dry mm  Eyes: EOM are normal. Right eye exhibits discharge (small amount of thick discharge). No scleral icterus.  Right conjunctival injection; patient states this happens intermittently at baseline.  Neck: Normal range of motion.  Cardiovascular: Normal rate, regular rhythm and intact distal pulses.   Pulmonary/Chest: Effort normal. No respiratory distress. She has no wheezes. She has no rales.  Respirations even and unlabored. Lungs clear to auscultation bilaterally.  Abdominal: Soft. There  is tenderness. A hernia (umbilical) is present.  Abdomen soft and morbidly obese. Unable to assess distention secondary to habitus. There is suprapubic and left lower quadrant tenderness to palpation. Umbilical hernia noted. No peritoneal signs.  Musculoskeletal: Normal range of motion.  Neurological: She is alert and oriented to person, place, and time. She exhibits normal muscle tone. Coordination normal.  Skin: Skin is warm and dry. No rash noted. She is not diaphoretic. No erythema. No pallor.  Psychiatric: She has a normal mood and affect. Her behavior is normal.  Nursing note and vitals reviewed.    ED Treatments / Results  Labs (all labs ordered are listed, but only abnormal results are displayed) Labs Reviewed  LIPASE, BLOOD - Abnormal; Notable for the following:       Result Value   Lipase 343 (*)    All other components within normal limits  COMPREHENSIVE METABOLIC PANEL - Abnormal; Notable for the following:    Potassium 5.2 (*)    CO2 21 (*)    Glucose, Bld  330 (*)    BUN 25 (*)    Albumin 3.4 (*)    AST 355 (*)    ALT 438 (*)    Alkaline Phosphatase 153 (*)    Total Bilirubin 4.6 (*)    All other components within normal limits  CBC - Abnormal; Notable for the following:    WBC 15.5 (*)    Hemoglobin 15.6 (*)    HCT 46.2 (*)    All other components within normal limits  URINALYSIS, ROUTINE W REFLEX MICROSCOPIC - Abnormal; Notable for the following:    APPearance HAZY (*)    Glucose, UA >=500 (*)    Hgb urine dipstick SMALL (*)    Ketones, ur 5 (*)    Nitrite POSITIVE (*)    Leukocytes, UA LARGE (*)    Bacteria, UA RARE (*)    Squamous Epithelial / LPF 0-5 (*)    All other components within normal limits  CBG MONITORING, ED - Abnormal; Notable for the following:    Glucose-Capillary 312 (*)    All other components within normal limits  PROTIME-INR  I-STAT TROPONIN, ED    EKG  EKG Interpretation None       Radiology US Abdomen Complete  Result Date: 06/07/2017 CLINICAL DATA:  Elevated LFT EXAM: ABDOMEN ULTRASOUND COMPLETE COMPARISON:  CT 06/07/2017 FINDINGS: Gallbladder: Multiple shadowing stones, measuring up to 1.3 cm. Negative sonographic Murphy. Slightly thickened wall up to 6.9 mm. Common bile duct: Diameter: Enlarged, measuring up to 8.8 mm Liver: Slight increased echogenicity. No focal hepatic abnormality. Portal vein is patent on color Doppler imaging with normal direction of blood flow towards the liver. IVC: No abnormality visualized. Pancreas: Poorly visualized. Spleen: Size and appearance within normal limits. Right Kidney: Length: 12.1 cm. Cortical thinning. No hydronephrosis. Left Kidney: Length: 13.1 cm. Cortical thinning. No hydronephrosis. Septated cyst in the mid to lower pole measuring 6.1 x 4.2 x 4.7 cm. Abdominal aorta: Proximal aorta not seen. Mid and distal aortic segments are non dilated. Other findings: None. IMPRESSION: 1. Cholelithiasis with mild wall thickening but negative sonographic Murphy. Findings  are nonspecific. Follow-up hepatobiliary nuclear medicine imaging could be performed if concern for an acute cholecystitis. 2. Slightly enlarged common bile duct up to 8.8 mm. If laboratory values suggest obstruction, further evaluation with MRCP could be considered 3. Slightly echogenic liver consistent with fatty infiltration 4. Cortical atrophy of the kidneys. 6.1 cm septated cyst in the mid to lower  left kidney. Electronically Signed   By: Donavan Foil M.D.   On: 06/07/2017 02:29   Ct Abdomen Pelvis W Contrast  Result Date: 06/07/2017 CLINICAL DATA:  Abdominal pain with nausea and vomiting for 3 days, elevated white count and lipase EXAM: CT ABDOMEN AND PELVIS WITH CONTRAST TECHNIQUE: Multidetector CT imaging of the abdomen and pelvis was performed using the standard protocol following bolus administration of intravenous contrast. CONTRAST:  80 mL Isovue 300 intravenous COMPARISON:  MRI 05/01/2017, CT 07/23/2009 FINDINGS: Lower chest: Lung bases demonstrate no acute consolidation or pleural effusion. Mitral calcifications. Normal heart size. Mild circumferential thickening of the distal esophagus. Hepatobiliary: Multiple calcified gallstones. No focal hepatic abnormality or biliary dilatation Pancreas: Mild diffuse edema around the pancreas consistent with acute pancreatitis. No organizing fluid collections. A small cystic lesion at the inferior pancreatic head measures 17 mm and was noted on comparison MRI. Lesions in the pancreatic tail better seen on MRI. Spleen: Normal in size without focal abnormality. Adrenals/Urinary Tract: Adrenal glands are normal. Subcentimeter hypodense lesions within both kidneys. Punctate nonobstructing stone mid right kidney. 5.3 cm septated cyst in the mid to lower left kidney re- demonstrated. Bladder normal. Stomach/Bowel: The stomach is nonenlarged. No dilated small bowel. No colon wall thickening. Vascular/Lymphatic: Aortic atherosclerosis. No enlarged abdominal or pelvic  lymph nodes. Reproductive: Uterus and bilateral adnexa are unremarkable. Other: Negative for free air or free fluid. Moderate fat containing periumbilical hernia with small amount of fluid and stranding in the hernia sac. Musculoskeletal: Advanced degenerative changes of the spine with marked endplate changes at D3-U2. Grade 1 anterolisthesis of L4 on L5. IMPRESSION: 1. Mild edema and diffuse hazy infiltration surrounding the pancreas, consistent with acute pancreatitis. No organizing fluid collections. Stable 17 mm cystic lesion at the inferior head of the pancreas. 2. Gallstones 3. Stable septated cyst within the mid to lower pole of the left kidney. Nonobstructing stone in the right kidney. 4. Circumferential wall thickening of the distal esophagus may reflect an esophagitis. 5. Moderate fat containing periumbilical hernia with some fluid and edema in the hernia sac. Electronically Signed   By: Donavan Foil M.D.   On: 06/07/2017 02:24    Procedures Procedures (including critical care time)  Medications Ordered in ED Medications  ondansetron (ZOFRAN-ODT) disintegrating tablet 4 mg (not administered)  iopamidol (ISOVUE-300) 61 % injection (not administered)  cefTRIAXone (ROCEPHIN) 1 g in dextrose 5 % 50 mL IVPB (not administered)  sodium chloride 0.9 % bolus 2,000 mL (0 mLs Intravenous Stopped 06/07/17 0100)  ondansetron (ZOFRAN) injection 4 mg (4 mg Intravenous Given 06/06/17 2318)  fentaNYL (SUBLIMAZE) injection 50 mcg (50 mcg Intravenous Given 06/07/17 0043)  iopamidol (ISOVUE-300) 61 % injection 100 mL (80 mLs Intravenous Contrast Given 06/07/17 0108)     Initial Impression / Assessment and Plan / ED Course  I have reviewed the triage vital signs and the nursing notes.  Pertinent labs & imaging results that were available during my care of the patient were reviewed by me and considered in my medical decision making (see chart for details).     Patient presenting for abdominal pain with  associated nausea and vomiting. Patient with notable transaminitis as well as elevated alkaline phosphatase and bilirubin. Lipase 343. Patient afebrile, though she has a leukocytosis of 15.5. A CT was obtained which shows findings consistent with acute pancreatitis. Ultrasound also performed which shows mild gallbladder wall thickening and mild dilatation of the common bile duct. High suspicion for choledocholithiasis given transaminitis and elevated bilirubin.  This is likely causing patient's secondary pancreatitis. I have discussed admission for MRCP. Patient agreeable to admission at this time.  Urinalysis has also resulted, suggestive of a urinary tract infection. IV Rocephin ordered for this. Admission discussed with Dr. Blaine Hamper of [facility] who will admit. Anticipate GI consult in the AM.   Final Clinical Impressions(s) / ED Diagnoses   Final diagnoses:  Gallstone pancreatitis  Choledocholithiasis  Acute cystitis without hematuria    New Prescriptions New Prescriptions   No medications on file     Antonietta Breach, PA-C 06/07/17 0329    Molpus, Jenny Reichmann, MD 06/07/17 [phone]

## 2017-06-07 ENCOUNTER — Emergency Department (HOSPITAL_COMMUNITY): Payer: Medicare Other

## 2017-06-07 ENCOUNTER — Inpatient Hospital Stay (HOSPITAL_COMMUNITY): Payer: Medicare Other

## 2017-06-07 ENCOUNTER — Encounter (HOSPITAL_COMMUNITY): Payer: Self-pay

## 2017-06-07 DIAGNOSIS — N390 Urinary tract infection, site not specified: Secondary | ICD-10-CM | POA: Diagnosis present

## 2017-06-07 DIAGNOSIS — Z792 Long term (current) use of antibiotics: Secondary | ICD-10-CM | POA: Diagnosis not present

## 2017-06-07 DIAGNOSIS — R509 Fever, unspecified: Secondary | ICD-10-CM | POA: Diagnosis not present

## 2017-06-07 DIAGNOSIS — Z794 Long term (current) use of insulin: Secondary | ICD-10-CM | POA: Diagnosis not present

## 2017-06-07 DIAGNOSIS — R6510 Systemic inflammatory response syndrome (SIRS) of non-infectious origin without acute organ dysfunction: Secondary | ICD-10-CM | POA: Diagnosis present

## 2017-06-07 DIAGNOSIS — Z8673 Personal history of transient ischemic attack (TIA), and cerebral infarction without residual deficits: Secondary | ICD-10-CM | POA: Diagnosis not present

## 2017-06-07 DIAGNOSIS — Z7983 Long term (current) use of bisphosphonates: Secondary | ICD-10-CM | POA: Diagnosis not present

## 2017-06-07 DIAGNOSIS — R7989 Other specified abnormal findings of blood chemistry: Secondary | ICD-10-CM | POA: Diagnosis present

## 2017-06-07 DIAGNOSIS — R935 Abnormal findings on diagnostic imaging of other abdominal regions, including retroperitoneum: Secondary | ICD-10-CM

## 2017-06-07 DIAGNOSIS — E875 Hyperkalemia: Secondary | ICD-10-CM | POA: Diagnosis present

## 2017-06-07 DIAGNOSIS — K8064 Calculus of gallbladder and bile duct with chronic cholecystitis without obstruction: Secondary | ICD-10-CM | POA: Diagnosis present

## 2017-06-07 DIAGNOSIS — R32 Unspecified urinary incontinence: Secondary | ICD-10-CM | POA: Diagnosis present

## 2017-06-07 DIAGNOSIS — K760 Fatty (change of) liver, not elsewhere classified: Secondary | ICD-10-CM | POA: Diagnosis present

## 2017-06-07 DIAGNOSIS — Z6841 Body Mass Index (BMI) 40.0 and over, adult: Secondary | ICD-10-CM | POA: Diagnosis not present

## 2017-06-07 DIAGNOSIS — R945 Abnormal results of liver function studies: Secondary | ICD-10-CM | POA: Diagnosis not present

## 2017-06-07 DIAGNOSIS — K851 Biliary acute pancreatitis without necrosis or infection: Secondary | ICD-10-CM | POA: Diagnosis present

## 2017-06-07 DIAGNOSIS — E1165 Type 2 diabetes mellitus with hyperglycemia: Secondary | ICD-10-CM | POA: Diagnosis present

## 2017-06-07 DIAGNOSIS — K8050 Calculus of bile duct without cholangitis or cholecystitis without obstruction: Secondary | ICD-10-CM | POA: Diagnosis present

## 2017-06-07 DIAGNOSIS — Z86718 Personal history of other venous thrombosis and embolism: Secondary | ICD-10-CM | POA: Diagnosis not present

## 2017-06-07 DIAGNOSIS — I110 Hypertensive heart disease with heart failure: Secondary | ICD-10-CM | POA: Diagnosis present

## 2017-06-07 DIAGNOSIS — I5032 Chronic diastolic (congestive) heart failure: Secondary | ICD-10-CM | POA: Diagnosis present

## 2017-06-07 DIAGNOSIS — K862 Cyst of pancreas: Secondary | ICD-10-CM | POA: Diagnosis present

## 2017-06-07 DIAGNOSIS — R933 Abnormal findings on diagnostic imaging of other parts of digestive tract: Secondary | ICD-10-CM | POA: Diagnosis not present

## 2017-06-07 DIAGNOSIS — Z79899 Other long term (current) drug therapy: Secondary | ICD-10-CM | POA: Diagnosis not present

## 2017-06-07 DIAGNOSIS — J9811 Atelectasis: Secondary | ICD-10-CM | POA: Diagnosis not present

## 2017-06-07 DIAGNOSIS — I10 Essential (primary) hypertension: Secondary | ICD-10-CM | POA: Diagnosis not present

## 2017-06-07 DIAGNOSIS — N3000 Acute cystitis without hematuria: Secondary | ICD-10-CM | POA: Diagnosis not present

## 2017-06-07 DIAGNOSIS — E7800 Pure hypercholesterolemia, unspecified: Secondary | ICD-10-CM | POA: Diagnosis present

## 2017-06-07 DIAGNOSIS — D649 Anemia, unspecified: Secondary | ICD-10-CM | POA: Diagnosis present

## 2017-06-07 DIAGNOSIS — L102 Pemphigus foliaceous: Secondary | ICD-10-CM | POA: Diagnosis present

## 2017-06-07 DIAGNOSIS — E559 Vitamin D deficiency, unspecified: Secondary | ICD-10-CM | POA: Diagnosis present

## 2017-06-07 LAB — LACTIC ACID, PLASMA
LACTIC ACID, VENOUS: 1.6 mmol/L (ref 0.5–1.9)
Lactic Acid, Venous: 1.4 mmol/L (ref 0.5–1.9)

## 2017-06-07 LAB — APTT: aPTT: 32 seconds (ref 24–36)

## 2017-06-07 LAB — URINALYSIS, ROUTINE W REFLEX MICROSCOPIC
Bilirubin Urine: NEGATIVE
Glucose, UA: >=500 mg/dL — AB
KETONES UR: 5 mg/dL — AB
Nitrite: POSITIVE — AB
PH: 6 (ref 5.0–8.0)
Protein, ur: NEGATIVE mg/dL
Specific Gravity, Urine: 1.021 (ref 1.005–1.030)

## 2017-06-07 LAB — COMPREHENSIVE METABOLIC PANEL
ALBUMIN: 3.1 g/dL — AB (ref 3.5–5.0)
ALK PHOS: 153 U/L — AB (ref 38–126)
ALK PHOS: 146 U/L — AB (ref 38–126)
ALT: 438 U/L — AB (ref 14–54)
ALT: 363 U/L — ABNORMAL HIGH (ref 14–54)
ANION GAP: 10 (ref 5–15)
AST: 355 U/L — AB (ref 15–41)
AST: 221 U/L — AB (ref 15–41)
Albumin: 3.4 g/dL — ABNORMAL LOW (ref 3.5–5.0)
Anion gap: 12 (ref 5–15)
BILIRUBIN TOTAL: 4.6 mg/dL — AB (ref 0.3–1.2)
BILIRUBIN TOTAL: 2.4 mg/dL — AB (ref 0.3–1.2)
BUN: 25 mg/dL — AB (ref 6–20)
BUN: 24 mg/dL — AB (ref 6–20)
CALCIUM: 9.2 mg/dL (ref 8.9–10.3)
CALCIUM: 8.6 mg/dL — AB (ref 8.9–10.3)
CO2: 21 mmol/L — ABNORMAL LOW (ref 22–32)
CO2: 24 mmol/L (ref 22–32)
CREATININE: 0.78 mg/dL (ref 0.44–1.00)
Chloride: 104 mmol/L (ref 101–111)
Chloride: 107 mmol/L (ref 101–111)
Creatinine, Ser: 0.89 mg/dL (ref 0.44–1.00)
GFR calc Af Amer: >60 mL/min (ref >60)
GFR calc Af Amer: >60 mL/min (ref >60)
GFR calc non Af Amer: >60 mL/min (ref >60)
GLUCOSE: 279 mg/dL — AB (ref 65–99)
Glucose, Bld: 330 mg/dL — ABNORMAL HIGH (ref 65–99)
POTASSIUM: 5.2 mmol/L — AB (ref 3.5–5.1)
POTASSIUM: 4.2 mmol/L (ref 3.5–5.1)
SODIUM: 141 mmol/L (ref 135–145)
Sodium: 137 mmol/L (ref 135–145)
TOTAL PROTEIN: 7 g/dL (ref 6.5–8.1)
TOTAL PROTEIN: 6.5 g/dL (ref 6.5–8.1)

## 2017-06-07 LAB — CBC
HEMATOCRIT: 43.7 % (ref 36.0–46.0)
HEMOGLOBIN: 14.5 g/dL (ref 12.0–15.0)
MCH: 32.5 pg (ref 26.0–34.0)
MCHC: 33.2 g/dL (ref 30.0–36.0)
MCV: 98 fL (ref 78.0–100.0)
Platelets: 170 10*3/uL (ref 150–400)
RBC: 4.46 MIL/uL (ref 3.87–5.11)
RDW: 13 % (ref 11.5–15.5)
WBC: 16.6 10*3/uL — ABNORMAL HIGH (ref 4.0–10.5)

## 2017-06-07 LAB — GLUCOSE, CAPILLARY
GLUCOSE-CAPILLARY: 244 mg/dL — AB (ref 65–99)
Glucose-Capillary: 290 mg/dL — ABNORMAL HIGH (ref 65–99)
Glucose-Capillary: 252 mg/dL — ABNORMAL HIGH (ref 65–99)

## 2017-06-07 LAB — PROCALCITONIN: Procalcitonin: 0.28 ng/mL

## 2017-06-07 LAB — LIPASE, BLOOD: Lipase: 343 U/L — ABNORMAL HIGH (ref 11–51)

## 2017-06-07 LAB — PROTIME-INR
INR: 1.14
PROTHROMBIN TIME: 14.6 s (ref 11.4–15.2)

## 2017-06-07 LAB — BRAIN NATRIURETIC PEPTIDE: B Natriuretic Peptide: 95.8 pg/mL (ref 0.0–100.0)

## 2017-06-07 MED ORDER — GABAPENTIN 300 MG PO CAPS
300.0000 mg | ORAL_CAPSULE | Freq: Every day | ORAL | Status: DC
  Administered 2017-06-07 – 2017-06-11 (×5): 300 mg via ORAL
  Filled 2017-06-07 (×5): qty 1

## 2017-06-07 MED ORDER — PIPERACILLIN-TAZOBACTAM 3.375 G IVPB 30 MIN
3.3750 g | INTRAVENOUS | Status: AC
  Administered 2017-06-07: 3.375 g via INTRAVENOUS
  Filled 2017-06-07: qty 50

## 2017-06-07 MED ORDER — IOPAMIDOL (ISOVUE-300) INJECTION 61%
INTRAVENOUS | Status: AC
  Filled 2017-06-07: qty 100

## 2017-06-07 MED ORDER — VITAMIN D 1000 UNITS PO TABS
2000.0000 [IU] | ORAL_TABLET | Freq: Every day | ORAL | Status: DC
  Administered 2017-06-07 – 2017-06-12 (×5): 2000 [IU] via ORAL
  Filled 2017-06-07 (×5): qty 2

## 2017-06-07 MED ORDER — VITAMIN C 500 MG PO TABS
ORAL_TABLET | Freq: Every day | ORAL | Status: DC
  Administered 2017-06-07 – 2017-06-12 (×5): 500 mg via ORAL
  Filled 2017-06-07 (×5): qty 1

## 2017-06-07 MED ORDER — ADULT MULTIVITAMIN W/MINERALS CH
1.0000 | ORAL_TABLET | Freq: Every day | ORAL | Status: DC
  Administered 2017-06-07 – 2017-06-12 (×5): 1 via ORAL
  Filled 2017-06-07 (×5): qty 1

## 2017-06-07 MED ORDER — PREDNISOLONE ACETATE 1 % OP SUSP
1.0000 [drp] | Freq: Two times a day (BID) | OPHTHALMIC | Status: DC
  Administered 2017-06-07 – 2017-06-12 (×11): 1 [drp] via OPHTHALMIC
  Filled 2017-06-07: qty 5

## 2017-06-07 MED ORDER — ZOLPIDEM TARTRATE 5 MG PO TABS: 5.0000 mg | ORAL_TABLET | Freq: Every evening | ORAL | Status: DC | PRN

## 2017-06-07 MED ORDER — HYDRALAZINE HCL 20 MG/ML IJ SOLN: 5.0000 mg | INTRAMUSCULAR | Status: DC | PRN

## 2017-06-07 MED ORDER — SODIUM CHLORIDE 0.9 % IV SOLN
INTRAVENOUS | Status: DC
  Administered 2017-06-07 – 2017-06-08 (×4): via INTRAVENOUS

## 2017-06-07 MED ORDER — ONDANSETRON 4 MG PO TBDP
4.0000 mg | ORAL_TABLET | Freq: Three times a day (TID) | ORAL | Status: AC | PRN
  Administered 2017-06-09: 4 mg via ORAL
  Filled 2017-06-07: qty 1

## 2017-06-07 MED ORDER — POLYVINYL ALCOHOL 1.4 % OP SOLN
1.0000 [drp] | Freq: Three times a day (TID) | OPHTHALMIC | Status: DC
  Administered 2017-06-07 – 2017-06-12 (×16): 1 [drp] via OPHTHALMIC
  Filled 2017-06-07: qty 15

## 2017-06-07 MED ORDER — RISAQUAD PO CAPS
ORAL_CAPSULE | Freq: Every day | ORAL | Status: DC
  Administered 2017-06-07 – 2017-06-12 (×5): 1 via ORAL
  Filled 2017-06-07 (×5): qty 1

## 2017-06-07 MED ORDER — SODIUM POLYSTYRENE SULFONATE 15 GM/60ML PO SUSP
15.0000 g | Freq: Once | ORAL | Status: AC
  Administered 2017-06-07: 15 g via ORAL
  Filled 2017-06-07: qty 60

## 2017-06-07 MED ORDER — DEXTROSE 5 % IV SOLN
1.0000 g | Freq: Once | INTRAVENOUS | Status: DC
  Filled 2017-06-07: qty 10

## 2017-06-07 MED ORDER — FENTANYL CITRATE (PF) 100 MCG/2ML IJ SOLN
50.0000 ug | Freq: Once | INTRAMUSCULAR | Status: AC
  Administered 2017-06-07: 50 ug via INTRAVENOUS
  Filled 2017-06-07: qty 2

## 2017-06-07 MED ORDER — IOPAMIDOL (ISOVUE-300) INJECTION 61%
100.0000 mL | Freq: Once | INTRAVENOUS | Status: AC | PRN
  Administered 2017-06-07: 80 mL via INTRAVENOUS

## 2017-06-07 MED ORDER — INSULIN ASPART 100 UNIT/ML ~~LOC~~ SOLN
0.0000 [IU] | Freq: Three times a day (TID) | SUBCUTANEOUS | Status: DC
  Administered 2017-06-07: 3 [IU] via SUBCUTANEOUS
  Administered 2017-06-07: 5 [IU] via SUBCUTANEOUS
  Administered 2017-06-07: 7 [IU] via SUBCUTANEOUS
  Administered 2017-06-08: 2 [IU] via SUBCUTANEOUS
  Administered 2017-06-08: 5 [IU] via SUBCUTANEOUS
  Administered 2017-06-09: 3 [IU] via SUBCUTANEOUS
  Administered 2017-06-09: 2 [IU] via SUBCUTANEOUS
  Administered 2017-06-09: 3 [IU] via SUBCUTANEOUS
  Administered 2017-06-10: 1 [IU] via SUBCUTANEOUS
  Administered 2017-06-10: 3 [IU] via SUBCUTANEOUS
  Administered 2017-06-10 – 2017-06-11 (×3): 1 [IU] via SUBCUTANEOUS
  Administered 2017-06-12: 5 [IU] via SUBCUTANEOUS
  Administered 2017-06-12: 2 [IU] via SUBCUTANEOUS

## 2017-06-07 MED ORDER — PIPERACILLIN-TAZOBACTAM 3.375 G IVPB
3.3750 g | Freq: Three times a day (TID) | INTRAVENOUS | Status: DC
  Administered 2017-06-07 (×2): 3.375 g via INTRAVENOUS
  Administered 2017-06-08: 3.375 mg via INTRAVENOUS
  Administered 2017-06-08 – 2017-06-09 (×4): 3.375 g via INTRAVENOUS
  Filled 2017-06-07 (×6): qty 50

## 2017-06-07 MED ORDER — INSULIN GLARGINE 100 UNIT/ML ~~LOC~~ SOLN
25.0000 [IU] | Freq: Every day | SUBCUTANEOUS | Status: DC
  Administered 2017-06-07 – 2017-06-11 (×5): 25 [IU] via SUBCUTANEOUS
  Filled 2017-06-07 (×6): qty 0.25

## 2017-06-07 MED ORDER — FERROUS GLUCONATE 324 (38 FE) MG PO TABS
324.0000 mg | ORAL_TABLET | Freq: Every day | ORAL | Status: DC
  Administered 2017-06-07 – 2017-06-12 (×5): 324 mg via ORAL
  Filled 2017-06-07 (×6): qty 1

## 2017-06-07 MED ORDER — NYSTATIN 100000 UNIT/GM EX CREA
1.0000 "application " | TOPICAL_CREAM | Freq: Two times a day (BID) | CUTANEOUS | Status: DC
  Administered 2017-06-07 – 2017-06-12 (×10): 1 via TOPICAL
  Filled 2017-06-07: qty 15

## 2017-06-07 MED ORDER — ONDANSETRON HCL 4 MG/2ML IJ SOLN: 4.0000 mg | Freq: Three times a day (TID) | INTRAMUSCULAR | Status: DC | PRN

## 2017-06-07 MED ORDER — MORPHINE SULFATE (PF) 4 MG/ML IV SOLN
1.0000 mg | INTRAVENOUS | Status: DC | PRN
  Administered 2017-06-10: 1 mg via INTRAVENOUS
  Filled 2017-06-07: qty 1

## 2017-06-07 MED ORDER — GADOBENATE DIMEGLUMINE 529 MG/ML IV SOLN
20.0000 mL | Freq: Once | INTRAVENOUS | Status: AC | PRN
  Administered 2017-06-07: 20 mL via INTRAVENOUS

## 2017-06-07 MED ORDER — INSULIN GLARGINE 100 UNIT/ML ~~LOC~~ SOLN
10.0000 [IU] | Freq: Every day | SUBCUTANEOUS | Status: DC
  Administered 2017-06-07 – 2017-06-12 (×6): 10 [IU] via SUBCUTANEOUS
  Filled 2017-06-07 (×6): qty 0.1

## 2017-06-07 MED ORDER — HEPARIN (PORCINE) IN NACL 100-0.45 UNIT/ML-% IJ SOLN
1100.0000 [IU]/h | INTRAMUSCULAR | Status: DC
Start: 2017-06-07 — End: 2017-06-08
  Administered 2017-06-07: 1100 [IU]/h via INTRAVENOUS
  Filled 2017-06-07: qty 250

## 2017-06-07 NOTE — Care Management Note (Signed)
Case Management Note  Patient Details  Name: Julie Mora MRN: [id number] Date of Birth: 1940/10/25  Subjective/Objective: 76 y/o f admitted w/[place] pancreatitis. From home. Has cane. PT cons-await recc.                   Action/Plan:d/c plan home.   Expected Discharge Date:                  Expected Discharge Plan:  Home/Self Care  In-House Referral:     Discharge planning Services  CM Consult  Post Acute Care Choice:    Choice offered to:     DME Arranged:    DME Agency:     HH Arranged:    HH Agency:     Status of Service:  In process, will continue to follow  If discussed at Long Length of Stay Meetings, dates discussed:    Additional Comments:  Dessa Phi, RN 06/07/2017, 12:10 PM

## 2017-06-07 NOTE — Progress Notes (Addendum)
Inpatient Diabetes Program Recommendations  AACE/ADA: New Consensus Statement on Inpatient Glycemic Control (2015)  Target Ranges:  Prepandial:   less than 140 mg/dL      Peak postprandial:   less than 180 mg/dL (1-2 hours)      Critically ill patients:  140 - 180 mg/dL   Lab Results  Component Value Date   GLUCAP 244 (H) 06/07/2017   HGBA1C 11.8 (H) 01/21/2017    Review of Glycemic Control  Diabetes history: DM2 Outpatient Diabetes medications: Novolog 4-14 units tidwc, Lantus 20 units in am and 45 units QHS Current orders for Inpatient glycemic control: Lantus 10 units QAM and 25 units QHS, Novolog 0-9 units tidwc 11.8% HgbA1C on 01/21/2017. Needs update.  NPO/CL  Inpatient Diabetes Program Recommendations:   Increase Novolog to 0-20 units Q4H. Need updated HgbA1C.  Will follow. Thank you. Lorenda Peck, RD, LDN, CDE Inpatient Diabetes Coordinator [phone]

## 2017-06-07 NOTE — Consult Note (Signed)
Referring Provider: Dr. Cruzita Lederer Primary Care Physician:  Elby Showers, MD Primary Gastroenterologist:  Dr. Henrene Pastor  Reason for Consultation:  Gallstone pancreatitis;  ? CBD stone  HPI: Julie Mora is a 76 y.o. female with medical history significant of hypertension, hyperlipidemia, diabetes mellitus, stroke, DVT on Pradaxa, and CHF who presents with nausea, vomiting, abdominal pain, and increased urinary frequency.  Patient states that she had been having nausea, vomiting and abdominal pain for almost a week, which had worsened in the 3 days prior to admission.   ED Course: pt was found to have Lipase 343, WBC 15.5, lactic acid 1.4, positive urinalysis with moderate amount of leukocyte and positive nitrite, negative troponin, potassium  5.2 without EKG change, creatinine normal.  LFT's were elevated with total bili 4.69, ALP 153, ALT 438, and AST 355.  They were all completely normal just 2 months ago.  CT scan as follows:  IMPRESSION: 1. Mild edema and diffuse hazy infiltration surrounding the pancreas, consistent with acute pancreatitis. No organizing fluid collections. Stable 17 mm cystic lesion at the inferior head of the pancreas. 2. Gallstones 3. Stable septated cyst within the mid to lower pole of the left kidney. Nonobstructing stone in the right kidney. 4. Circumferential wall thickening of the distal esophagus may reflect an esophagitis. 5. Moderate fat containing periumbilical hernia with some fluid and edema in the hernia sac.  Ultrasound as follows:  IMPRESSION: 1. Cholelithiasis with mild wall thickening but negative sonographic Murphy. Findings are nonspecific. Follow-up hepatobiliary nuclear medicine imaging could be performed if concern for an acute cholecystitis. 2. Slightly enlarged common bile duct up to 8.8 mm. If laboratory values suggest obstruction, further evaluation with MRCP could be considered 3. Slightly echogenic liver consistent with fatty  infiltration 4. Cortical atrophy of the kidneys. 6.1 cm septated cyst in the mid to lower left kidney.  Last dose of Pradaxa 9/18.  MRCP was performed and we are awaiting those results.  She is feeling much better.  Essentially no further abdominal pain.  LFT's are trending down.   Past Medical History:  Diagnosis Date  . Anemia   . Essential hypertension, benign   . Incontinence   . Other and unspecified hyperlipidemia   . Pemphigus foliaceous   . Renal insufficiency   . Type II or unspecified type diabetes mellitus without mention of complication, not stated as uncontrolled   . Vitamin D deficiency     No past surgical history on file.  Prior to Admission medications   Medication Sig Start Date End Date Taking? Authorizing Provider  acetaminophen (TYLENOL) 500 MG tablet Take 500 mg by mouth every 6 (six) hours as needed for pain.   Yes [provider]  alendronate (FOSAMAX) 70 MG tablet TAKE 1 TABLET(70 MG) BY MOUTH EVERY 7 DAYS WITH A FULL GLASS OF WATER AND ON AN EMPTY STOMACH 05/05/17  Yes Baxley, Cresenciano Lick, MD  CALCIUM ASCORBATE PO Take 1 tablet by mouth daily.   Yes [provider]  Cholecalciferol (VITAMIN D) 2000 UNITS tablet Take 2,000 Units by mouth daily.     Yes [provider]  ferrous gluconate (FERGON) 324 MG tablet Take 1 tablet (324 mg total) by mouth daily with breakfast. 03/02/17  Yes Baxley, Cresenciano Lick, MD  gabapentin (NEURONTIN) 300 MG capsule take 1 capsule at bedtime 06/23/15  Yes [provider]  insulin aspart (NOVOLOG) 100 UNIT/ML injection Inject 5-12 Units into the skin 2 (two) times daily as needed for high blood sugar.  Patient taking differently: Inject 4-14 Units into the skin 3 (three) times daily with meals.  12/15/14  Yes Baxley, Cresenciano Lick, MD  Insulin Glargine (LANTUS SOLOSTAR) 100 UNIT/ML Solostar Pen Inject 35 units in skin at bedtime Patient taking differently: Inject 20-45 Units into the skin 2 (two) times daily. 20  units in the AM and  45 units in the PM 01/01/14  Yes Baxley, Cresenciano Lick, MD  Lactobacillus Rhamnosus, GG, (CULTURELLE PO) Take 1 capsule by mouth daily.   Yes [provider]  multivitamin [facility]) per tablet Take 1 tablet by mouth daily.     Yes [provider]  nystatin cream (MYCOSTATIN) Apply 1 application topically 2 (two) times daily. 03/02/17  Yes Baxley, Cresenciano Lick, MD  Polyethyl Glycol-Propyl Glycol (SYSTANE) 0.4-0.3 % SOLN Apply 1 drop to eye 3 (three) times daily. Both eyes, as needed   Yes [provider]  potassium chloride (K-DUR) 10 MEQ tablet Take 1 tablet (10 mEq total) by mouth daily. 03/02/17  Yes Baxley, Cresenciano Lick, MD  PRADAXA 75 MG CAPS capsule TAKE 1 CAPSULE(75 MG) BY MOUTH TWICE DAILY 04/07/17  Yes Baxley, Cresenciano Lick, MD  prednisoLONE acetate (PRED FORTE) 1 % ophthalmic suspension Place 1 drop into both eyes 2 (two) times daily.  05/08/12  Yes [provider]  simvastatin (ZOCOR) 20 MG tablet TAKE 1 TABLET BY MOUTH EVERY NIGHT AT BEDTIME 01/09/17  Yes Baxley, Cresenciano Lick, MD  torsemide (DEMADEX) 20 MG tablet Take 1 tablet (20 mg total) by mouth daily. 03/02/17  Yes Baxley, Cresenciano Lick, MD  ACCU-CHEK AVIVA PLUS test strip USE TO CHECK BLOOD SUGAR BEFORE BREAKFAST AND SUPPER 11/09/14   Elby Showers, MD  amoxicillin-clavulanate (AUGMENTIN) 500-125 MG tablet Take 1 tablet (500 mg total) by mouth 3 (three) times daily. Patient not taking: Reported on 06/07/2017 03/30/17   Elby Showers, MD  BD PEN NEEDLE NANO U/F 32G X 4 MM MISC USE AS DIRECTED 01/11/15   Elby Showers, MD  ciprofloxacin (CIPRO) 250 MG tablet Take 1 tablet (250 mg total) by mouth 2 (two) times daily. Patient not taking: Reported on 06/07/2017 03/09/17   Elby Showers, MD  ferrous gluconate (FERGON) 325 MG tablet Take 1 tablet (325 mg total) by mouth daily with breakfast. Patient not taking: Reported on 06/07/2017 01/22/17   Florencia Reasons, MD    Current Facility-Administered Medications  Medication Dose Route  Frequency Provider Last Rate Last Dose  . 0.9 %  sodium chloride infusion   Intravenous Continuous Ivor Costa, MD   Stopped at 06/07/17 1050  . acidophilus (RISAQUAD) capsule   Oral Daily Ivor Costa, MD   1 capsule at 06/07/17 0841  . cholecalciferol (VITAMIN D) tablet 2,000 Units  2,000 Units Oral Daily Ivor Costa, MD   2,000 Units at 06/07/17 0840  . ferrous gluconate (FERGON) tablet 324 mg  324 mg Oral Q breakfast Ivor Costa, MD   324 mg at 06/07/17 0347  . gabapentin (NEURONTIN) capsule 300 mg  300 mg Oral QHS Ivor Costa, MD      . heparin ADULT infusion 100 units/mL (25000 units/293mL sodium chloride 0.45%)  1,100 Units/hr Intravenous Continuous Dorrene German, [facility]   Stopped at 06/07/17 1050  . hydrALAZINE (APRESOLINE) injection 5 mg  5 mg Intravenous Q2H PRN Ivor Costa, MD      . insulin aspart (novoLOG) injection 0-9 Units  0-9 Units Subcutaneous TID WC Ivor Costa, MD   3 Units at 06/07/17 1239  .  insulin glargine (LANTUS) injection 10 Units  10 Units Subcutaneous Daily Ivor Costa, MD   10 Units at 06/07/17 [phone]  . insulin glargine (LANTUS) injection 25 Units  25 Units Subcutaneous QHS Ivor Costa, MD      . iopamidol (ISOVUE-300) 61 % injection           . morphine 4 MG/ML injection 1 mg  1 mg Intravenous Q3H PRN Ivor Costa, MD      . multivitamin with minerals tablet 1 tablet  1 tablet Oral Daily Ivor Costa, MD   1 tablet at 06/07/17 0841  . nystatin cream (MYCOSTATIN) 1 application  1 application Topical BID Ivor Costa, MD   1 application at 44/31/54 1000  . ondansetron (ZOFRAN) injection 4 mg  4 mg Intravenous Q8H PRN Ivor Costa, MD      . ondansetron (ZOFRAN-ODT) disintegrating tablet 4 mg  4 mg Oral Q8H PRN Ivor Costa, MD      . piperacillin-tazobactam (ZOSYN) IVPB 3.375 g  3.375 g Intravenous Q8H Dorrene German, RPH      . polyvinyl alcohol (LIQUIFILM TEARS) 1.4 % ophthalmic solution 1 drop  1 drop Both Eyes TID Ivor Costa, MD   1 drop at 06/07/17 0845  . prednisoLONE acetate  (PRED FORTE) 1 % ophthalmic suspension 1 drop  1 drop Both Eyes BID Ivor Costa, MD   1 drop at 06/07/17 0844  . vitamin C (ASCORBIC ACID) tablet   Oral Daily Ivor Costa, MD   500 mg at 06/07/17 0841  . zolpidem (AMBIEN) tablet 5 mg  5 mg Oral QHS PRN Ivor Costa, MD       Facility-Administered Medications Ordered in Other Encounters  Medication Dose Route Frequency Provider Last Rate Last Dose  . famotidine (PEPCID) IVPB 20 mg premix  20 mg Intravenous Q12H Wyatt Portela, MD   20 mg at 03/29/17 0944    Allergies as of 06/06/2017 - Review Complete 06/06/2017  Allergen Reaction Noted  . Azathioprine Shortness Of Breath 03/30/2011  . Cellcept [mycophenolate mofetil] Nausea Only 05/08/2013  . Ramipril Cough 03/30/2011    Family History  Problem Relation Age of Onset  . Cancer Father     Social History   Social History  . Marital status: Widowed    Spouse name: N/A  . Number of children: N/A  . Years of education: N/A   Occupational History  . Not on file.   Social History Main Topics  . Smoking status: Never Smoker  . Smokeless tobacco: Never Used  . Alcohol use No  . Drug use: No  . Sexual activity: No   Other Topics Concern  . Not on file   Social History Narrative  . No narrative on file    Review of Systems: ROS is O/W negative except as mentioned in HPI.  Physical Exam: Vital signs in last 24 hours: Temp:  [98.8 F (37.1 C)-99.4 F (37.4 C)] 99.4 F (37.4 C) (09/20 0434) Pulse Rate:  [86-95] 89 (09/20 0434) Resp:  [18-19] 18 (09/20 0434) BP: (113-145)/(70-99) 113/70 (09/20 0434) SpO2:  [90 %-97 %] 95 % (09/20 0434) Weight:  [247 lb 9.2 oz (112.3 kg)] 247 lb 9.2 oz (112.3 kg) (09/20 0434)   General:  Alert, Well-developed, well-nourished, pleasant and cooperative in NAD Head:  Normocephalic and atraumatic. Eyes:  Sclera clear, no icterus.  Conjunctiva pink. Ears:  Normal auditory acuity. Mouth:  No deformity or lesions.  Mucus membranes dry. Lungs:   Clear throughout to auscultation.  No wheezes, crackles, or rhonchi.  No increased WOB. Heart:  Regular rate and rhythm; no murmurs, clicks, rubs,  or gallops. Abdomen:  Soft, non-distended.  BS present.  Non-tender. Rectal:  Deferred  Msk:  Symmetrical without gross deformities. Pulses:  Normal pulses noted. Extremities:  Significant pitting edema noted in B/L LE's with skin changes. Neurologic:  Alert and oriented x 4;  grossly normal neurologically. Skin:  Intact without significant lesions or rashes. Psych:  Alert and cooperative. Normal mood and affect.  Intake/Output from previous day: 09/19 0701 - 09/20 0700 In: 112.4 [I.V.:112.4] Out: -   Lab Results:  Recent Labs  06/06/17 2320 06/07/17 0552  WBC 15.5* 16.6*  HGB 15.6* 14.5  HCT 46.2* 43.7  PLT 171 170   BMET  Recent Labs  06/06/17 2320 06/07/17 0552  NA 137 141  K 5.2* 4.2  CL 104 107  CO2 21* 24  GLUCOSE 330* 279*  BUN 25* 24*  CREATININE 0.78 0.89  CALCIUM 9.2 8.6*   LFT  Recent Labs  06/07/17 0552  PROT 6.5  ALBUMIN 3.1*  AST 221*  ALT 363*  ALKPHOS 146*  BILITOT 2.4*   PT/INR  Recent Labs  06/06/17 2320  LABPROT 14.6  INR 1.14   Studies/Results: US Abdomen Complete  Result Date: 06/07/2017 CLINICAL DATA:  Elevated LFT EXAM: ABDOMEN ULTRASOUND COMPLETE COMPARISON:  CT 06/07/2017 FINDINGS: Gallbladder: Multiple shadowing stones, measuring up to 1.3 cm. Negative sonographic Murphy. Slightly thickened wall up to 6.9 mm. Common bile duct: Diameter: Enlarged, measuring up to 8.8 mm Liver: Slight increased echogenicity. No focal hepatic abnormality. Portal vein is patent on color Doppler imaging with normal direction of blood flow towards the liver. IVC: No abnormality visualized. Pancreas: Poorly visualized. Spleen: Size and appearance within normal limits. Right Kidney: Length: 12.1 cm. Cortical thinning. No hydronephrosis. Left Kidney: Length: 13.1 cm. Cortical thinning. No hydronephrosis.  Septated cyst in the mid to lower pole measuring 6.1 x 4.2 x 4.7 cm. Abdominal aorta: Proximal aorta not seen. Mid and distal aortic segments are non dilated. Other findings: None. IMPRESSION: 1. Cholelithiasis with mild wall thickening but negative sonographic Murphy. Findings are nonspecific. Follow-up hepatobiliary nuclear medicine imaging could be performed if concern for an acute cholecystitis. 2. Slightly enlarged common bile duct up to 8.8 mm. If laboratory values suggest obstruction, further evaluation with MRCP could be considered 3. Slightly echogenic liver consistent with fatty infiltration 4. Cortical atrophy of the kidneys. 6.1 cm septated cyst in the mid to lower left kidney. Electronically Signed   By: Donavan Foil M.D.   On: 06/07/2017 02:29   Ct Abdomen Pelvis W Contrast  Result Date: 06/07/2017 CLINICAL DATA:  Abdominal pain with nausea and vomiting for 3 days, elevated white count and lipase EXAM: CT ABDOMEN AND PELVIS WITH CONTRAST TECHNIQUE: Multidetector CT imaging of the abdomen and pelvis was performed using the standard protocol following bolus administration of intravenous contrast. CONTRAST:  80 mL Isovue 300 intravenous COMPARISON:  MRI 05/01/2017, CT 07/23/2009 FINDINGS: Lower chest: Lung bases demonstrate no acute consolidation or pleural effusion. Mitral calcifications. Normal heart size. Mild circumferential thickening of the distal esophagus. Hepatobiliary: Multiple calcified gallstones. No focal hepatic abnormality or biliary dilatation Pancreas: Mild diffuse edema around the pancreas consistent with acute pancreatitis. No organizing fluid collections. A small cystic lesion at the inferior pancreatic head measures 17 mm and was noted on comparison MRI. Lesions in the pancreatic tail better seen on MRI. Spleen: Normal in size without focal abnormality. Adrenals/Urinary  Tract: Adrenal glands are normal. Subcentimeter hypodense lesions within both kidneys. Punctate nonobstructing  stone mid right kidney. 5.3 cm septated cyst in the mid to lower left kidney re- demonstrated. Bladder normal. Stomach/Bowel: The stomach is nonenlarged. No dilated small bowel. No colon wall thickening. Vascular/Lymphatic: Aortic atherosclerosis. No enlarged abdominal or pelvic lymph nodes. Reproductive: Uterus and bilateral adnexa are unremarkable. Other: Negative for free air or free fluid. Moderate fat containing periumbilical hernia with small amount of fluid and stranding in the hernia sac. Musculoskeletal: Advanced degenerative changes of the spine with marked endplate changes at O7-H2. Grade 1 anterolisthesis of L4 on L5. IMPRESSION: 1. Mild edema and diffuse hazy infiltration surrounding the pancreas, consistent with acute pancreatitis. No organizing fluid collections. Stable 17 mm cystic lesion at the inferior head of the pancreas. 2. Gallstones 3. Stable septated cyst within the mid to lower pole of the left kidney. Nonobstructing stone in the right kidney. 4. Circumferential wall thickening of the distal esophagus may reflect an esophagitis. 5. Moderate fat containing periumbilical hernia with some fluid and edema in the hernia sac. Electronically Signed   By: Donavan Foil M.D.   On: 06/07/2017 02:24   IMPRESSION:  *76 year old female with what appears to be gallstone pancreatitis as confirmed by elevated lipase, elevated LFT's, and CT scan.  CBD mildly dilated.  MRCP just performed and we are awaiting those results to determine need for ERCP. *History of DVT on Pradaxa:  Last dose 9/18.  On IV heparin here.  PLAN: *Await MRCP. *Monitor LFT's. *Surgery is seeing the patient as well.   ZEHR, JESSICA D.  06/07/2017, 12:39 PM  Pager number [phone]  GI ATTENDING  History, laboratories, x-rays reviewed. Patient personally seen and examined. Agree with comprehensive consultation note as outlined above. Pleasant 76 year old with listed comorbidities who presents with acute biliary  pancreatitis. No evidence for biliary obstruction or choledocholithiasis by imaging. She has gallstones. Recovering nicely. No indication for ERCP. Agree with surgical consultation regarding laparoscopic cholecystectomy. Discussed with patient. We are available for questions or problems. We'll sign off. Thank you  Docia Chuck. Geri Seminole., M.D. [facility] Division of Gastroenterology

## 2017-06-07 NOTE — H&P (Signed)
History and Physical    Julie Mora JJH:[id number] DOB: 19-Dec-1940 DOA: 06/06/2017  Referring MD/NP/PA:   PCP: Elby Showers, MD   Patient coming from:  The patient is coming from home.  At baseline, pt is independent for most of ADL. SNF  Assistant living facility   [facility]      Chief Complaint: Nausea vomiting, abdominal pain, increased urinary frequency.  HPI: Julie Mora is a 76 y.o. female with medical history significant of hypertension, hyperlipidemia, diabetes mellitus, stroke, DVT on Pradaxa, CHF, who presents with nausea, vomiting, abdominal pain and increased urinary frequency.  Patient states that she has been having nausea, vomiting and abdominal pain for almost a week, which has worsened in the past 3 days. Her abdominal pain is located in the lower abdomen, constant, 5 out of 10 in severity, sharp, nonradiating. She vomited 2-3 times each day. Patient has chills, no fever, no diarrhea. Patient denies chest pain, SOB, cough, unilateral weakness. She reports increased urinary frequency, but no dysuria or burning on urination.  ED Course: pt was found to have Lipase 343, WBC 15.5, lactic acid 1.4, positive urinalysis with moderate amount of leukocyte and positive nitrite, negative troponin, potassium  522 without EKG change, creatinine normal, temperature normal, heart rate 95, oxygen sats are 91-92% on room air. Patient is admitted to telemetry bed as inpatient.  CT-abd/pelvis showed: 1. Mild edema and diffuse hazy infiltration surrounding the pancreas, consistent with acute pancreatitis. No organizing fluid collections. Stable 17 mm cystic lesion at the inferior head of the pancreas. 2. Gallstones 3. Stable septated cyst within the mid to lower pole of the left kidney. Nonobstructing stone in the right kidney. 4. Circumferential wall thickening of the distal esophagus may reflect an esophagitis. 5. Moderate fat containing periumbilical hernia with some fluid and  edema in the hernia sac  Abdominal ultrasound: 1. Cholelithiasis with mild wall thickening but negative sonographic Murphy. Findings are nonspecific.  2. Slightly enlarged common bile duct up to 8.8 mm. 3. Slightly echogenic liver consistent with fatty infiltration 4. Cortical atrophy of the kidneys. 6.1 cm septated cyst in the midto lower left kidney.   Review of Systems:   General: no fevers, chills, no body weight gain, has poor appetite, has fatigue HEENT: no blurry vision, hearing changes or sore throat Respiratory: no dyspnea, coughing, wheezing CV: no chest pain, no palpitations GI: has nausea, vomiting, abdominal pain, no diarrhea, constipation GU: no dysuria, burning on urination, has increased urinary frequency, no hematuria  Ext: no leg edema Neuro: no unilateral weakness, numbness, or tingling, no vision change or hearing loss Skin: no rash, no skin tear. MSK: No muscle spasm, no deformity, no limitation of range of movement in spin Heme: No easy bruising.  Travel history: No recent long distant travel.  Allergy:  Allergies  Allergen Reactions  . Azathioprine Shortness Of Breath  . Cellcept [Mycophenolate Mofetil] Nausea Only    severe nausea  . Ramipril Cough    Past Medical History:  Diagnosis Date  . Anemia   . Essential hypertension, benign   . Incontinence   . Other and unspecified hyperlipidemia   . Pemphigus foliaceous   . Renal insufficiency   . Type II or unspecified type diabetes mellitus without mention of complication, not stated as uncontrolled   . Vitamin D deficiency     No past surgical history on file.  Social History:  reports that she has never smoked. She has never used smokeless tobacco. She reports that she  does not drink alcohol or use drugs.  Family History:  Family History  Problem Relation Age of Onset  . Cancer Father      Prior to Admission medications   Medication Sig Start Date End Date Taking? Authorizing Provider    ACCU-CHEK AVIVA PLUS test strip USE TO CHECK BLOOD SUGAR BEFORE BREAKFAST AND SUPPER 11/09/14   Elby Showers, MD  acetaminophen (TYLENOL) 500 MG tablet Take 500 mg by mouth every 6 (six) hours as needed for pain.    [provider]  alendronate (FOSAMAX) 70 MG tablet TAKE 1 TABLET(70 MG) BY MOUTH EVERY 7 DAYS WITH A FULL GLASS OF WATER AND ON AN EMPTY STOMACH 05/05/17   Elby Showers, MD  amoxicillin-clavulanate (AUGMENTIN) 500-125 MG tablet Take 1 tablet (500 mg total) by mouth 3 (three) times daily. 03/30/17   Elby Showers, MD  BD PEN NEEDLE NANO U/F 32G X 4 MM MISC USE AS DIRECTED 01/11/15   Elby Showers, MD  CALCIUM ASCORBATE PO Take 1 tablet by mouth daily.    [provider]  Cholecalciferol (VITAMIN D) 2000 UNITS tablet Take 2,000 Units by mouth daily.      [provider]  ciprofloxacin (CIPRO) 250 MG tablet Take 1 tablet (250 mg total) by mouth 2 (two) times daily. 03/09/17   Elby Showers, MD  ferrous gluconate (FERGON) 324 MG tablet Take 1 tablet (324 mg total) by mouth daily with breakfast. 03/02/17   Elby Showers, MD  ferrous gluconate (FERGON) 325 MG tablet Take 1 tablet (325 mg total) by mouth daily with breakfast. 01/22/17   Florencia Reasons, MD  gabapentin (NEURONTIN) 300 MG capsule TK ONE C PO HS 06/23/15   [provider]  insulin aspart (NOVOLOG) 100 UNIT/ML injection Inject 5-12 Units into the skin 2 (two) times daily as needed for high blood sugar. 12/15/14   Elby Showers, MD  Insulin Glargine (LANTUS SOLOSTAR) 100 UNIT/ML Solostar Pen Inject 35 units in skin at bedtime 01/01/14   Elby Showers, MD  Lactobacillus Rhamnosus, GG, (CULTURELLE PO) Take 1 capsule by mouth daily.    [provider]  multivitamin [facility]) per tablet Take 1 tablet by mouth daily.      [provider]  nystatin cream (MYCOSTATIN) Apply 1 application topically 2 (two) times daily. 03/02/17   Elby Showers, MD  Polyethyl Glycol-Propyl Glycol (SYSTANE)  0.4-0.3 % SOLN Apply 1 drop to eye 3 (three) times daily. Both eyes, as needed    [provider]  potassium chloride (K-DUR) 10 MEQ tablet Take 1 tablet (10 mEq total) by mouth daily. 03/02/17   Elby Showers, MD  PRADAXA 75 MG CAPS capsule TAKE 1 CAPSULE(75 MG) BY MOUTH TWICE DAILY 04/07/17   Elby Showers, MD  prednisoLONE acetate (PRED FORTE) 1 % ophthalmic suspension Place 1 drop into both eyes 2 (two) times daily.  05/08/12   [provider]  simvastatin (ZOCOR) 20 MG tablet TAKE 1 TABLET BY MOUTH EVERY NIGHT AT BEDTIME 01/09/17   Elby Showers, MD  torsemide (DEMADEX) 20 MG tablet Take 1 tablet (20 mg total) by mouth daily. 03/02/17   Elby Showers, MD  triamcinolone cream (KENALOG) 0.1 % Apply 1 application topically daily. 01/11/17   [provider]    Physical Exam: Vitals:   06/07/17 0237 06/07/17 0300 06/07/17 0345 06/07/17 0434  BP: (!) 142/83 (!) 134/99 129/83 113/70  Pulse: 95 93 90 89  Resp: 19  18  Temp:    99.4 F (37.4 C)  TempSrc:    Oral  SpO2: 92% 96% 97% 95%  Weight:    112.3 kg (247 lb 9.2 oz)  Height:    5\' 4"  (1.626 m)   General: Not in acute distress HEENT:       Eyes: PERRL, EOMI, no scleral icterus.       ENT: No discharge from the ears and nose, no pharynx injection, no tonsillar enlargement.        Neck: No JVD, no bruit, no mass felt. Heme: No neck lymph node enlargement. Cardiac: S1/S2, RRR, No murmurs, No gallops or rubs. Respiratory: No rales, wheezing, rhonchi or rubs. GI: Soft, nondistended, has tenderness in lower abdomen, no rebound pain, no organomegaly, BS present. GU: No hematuria Ext: No pitting leg edema bilaterally. 2+DP/PT pulse bilaterally. Musculoskeletal: No joint deformities, No joint redness or warmth, no limitation of ROM in spin. Skin: No rashes.  Neuro: Alert, oriented X3, cranial nerves II-XII grossly intact, moves all extremities normally.   Psych: Patient is not psychotic, no suicidal or hemocidal  ideation.  Labs on Admission: I have personally reviewed following labs and imaging studies  CBC:  Recent Labs Lab 06/06/17 2320 06/07/17 0552  WBC 15.5* 16.6*  HGB 15.6* 14.5  HCT 46.2* 43.7  MCV 97.1 98.0  PLT 171 779   Basic Metabolic Panel:  Recent Labs Lab 06/06/17 2320 06/07/17 0552  NA 137 141  K 5.2* 4.2  CL 104 107  CO2 21* 24  GLUCOSE 330* 279*  BUN 25* 24*  CREATININE 0.78 0.89  CALCIUM 9.2 8.6*   GFR: Estimated Creatinine Clearance: 67 mL/min (by C-G formula based on SCr of 0.89 mg/dL). Liver Function Tests:  Recent Labs Lab 06/06/17 2320 06/07/17 0552  AST 355* 221*  ALT 438* 363*  ALKPHOS 153* 146*  BILITOT 4.6* 2.4*  PROT 7.0 6.5  ALBUMIN 3.4* 3.1*    Recent Labs Lab 06/06/17 2320  LIPASE 343*   No results for input(s): AMMONIA in the last 168 hours. Coagulation Profile:  Recent Labs Lab 06/06/17 2320  INR 1.14   Cardiac Enzymes: No results for input(s): CKTOTAL, CKMB, CKMBINDEX, TROPONINI in the last 168 hours. BNP (last 3 results) No results for input(s): PROBNP in the last 8760 hours. HbA1C: No results for input(s): HGBA1C in the last 72 hours. CBG:  Recent Labs Lab 06/06/17 2330  GLUCAP 312*   Lipid Profile: No results for input(s): CHOL, HDL, LDLCALC, TRIG, CHOLHDL, LDLDIRECT in the last 72 hours. Thyroid Function Tests: No results for input(s): TSH, T4TOTAL, FREET4, T3FREE, THYROIDAB in the last 72 hours. Anemia Panel: No results for input(s): VITAMINB12, FOLATE, FERRITIN, TIBC, IRON, RETICCTPCT in the last 72 hours. Urine analysis:    Component Value Date/Time   COLORURINE YELLOW 06/07/2017 0234   APPEARANCEUR HAZY (A) 06/07/2017 0234   LABSPEC 1.021 06/07/2017 0234   PHURINE 6.0 06/07/2017 0234   GLUCOSEU >=500 (A) 06/07/2017 0234   HGBUR SMALL (A) 06/07/2017 0234   BILIRUBINUR NEGATIVE 06/07/2017 0234   BILIRUBINUR negative 04/23/2017 1205   KETONESUR 5 (A) 06/07/2017 0234   PROTEINUR NEGATIVE  06/07/2017 0234   UROBILINOGEN 0.2 04/23/2017 1205   UROBILINOGEN 0.2 12/11/2010 1434   NITRITE POSITIVE (A) 06/07/2017 0234   LEUKOCYTESUR LARGE (A) 06/07/2017 0234   Sepsis Labs: @LABRCNTIP (procalcitonin:4,lacticidven:4) )No results found for this or any previous visit (from the past 240 hour(s)).   Radiological Exams on Admission: US Abdomen Complete  Result Date:  06/07/2017 CLINICAL DATA:  Elevated LFT EXAM: ABDOMEN ULTRASOUND COMPLETE COMPARISON:  CT 06/07/2017 FINDINGS: Gallbladder: Multiple shadowing stones, measuring up to 1.3 cm. Negative sonographic Murphy. Slightly thickened wall up to 6.9 mm. Common bile duct: Diameter: Enlarged, measuring up to 8.8 mm Liver: Slight increased echogenicity. No focal hepatic abnormality. Portal vein is patent on color Doppler imaging with normal direction of blood flow towards the liver. IVC: No abnormality visualized. Pancreas: Poorly visualized. Spleen: Size and appearance within normal limits. Right Kidney: Length: 12.1 cm. Cortical thinning. No hydronephrosis. Left Kidney: Length: 13.1 cm. Cortical thinning. No hydronephrosis. Septated cyst in the mid to lower pole measuring 6.1 x 4.2 x 4.7 cm. Abdominal aorta: Proximal aorta not seen. Mid and distal aortic segments are non dilated. Other findings: None. IMPRESSION: 1. Cholelithiasis with mild wall thickening but negative sonographic Murphy. Findings are nonspecific. Follow-up hepatobiliary nuclear medicine imaging could be performed if concern for an acute cholecystitis. 2. Slightly enlarged common bile duct up to 8.8 mm. If laboratory values suggest obstruction, further evaluation with MRCP could be considered 3. Slightly echogenic liver consistent with fatty infiltration 4. Cortical atrophy of the kidneys. 6.1 cm septated cyst in the mid to lower left kidney. Electronically Signed   By: Donavan Foil M.D.   On: 06/07/2017 02:29   Ct Abdomen Pelvis W Contrast  Result Date: 06/07/2017 CLINICAL DATA:   Abdominal pain with nausea and vomiting for 3 days, elevated white count and lipase EXAM: CT ABDOMEN AND PELVIS WITH CONTRAST TECHNIQUE: Multidetector CT imaging of the abdomen and pelvis was performed using the standard protocol following bolus administration of intravenous contrast. CONTRAST:  80 mL Isovue 300 intravenous COMPARISON:  MRI 05/01/2017, CT 07/23/2009 FINDINGS: Lower chest: Lung bases demonstrate no acute consolidation or pleural effusion. Mitral calcifications. Normal heart size. Mild circumferential thickening of the distal esophagus. Hepatobiliary: Multiple calcified gallstones. No focal hepatic abnormality or biliary dilatation Pancreas: Mild diffuse edema around the pancreas consistent with acute pancreatitis. No organizing fluid collections. A small cystic lesion at the inferior pancreatic head measures 17 mm and was noted on comparison MRI. Lesions in the pancreatic tail better seen on MRI. Spleen: Normal in size without focal abnormality. Adrenals/Urinary Tract: Adrenal glands are normal. Subcentimeter hypodense lesions within both kidneys. Punctate nonobstructing stone mid right kidney. 5.3 cm septated cyst in the mid to lower left kidney re- demonstrated. Bladder normal. Stomach/Bowel: The stomach is nonenlarged. No dilated small bowel. No colon wall thickening. Vascular/Lymphatic: Aortic atherosclerosis. No enlarged abdominal or pelvic lymph nodes. Reproductive: Uterus and bilateral adnexa are unremarkable. Other: Negative for free air or free fluid. Moderate fat containing periumbilical hernia with small amount of fluid and stranding in the hernia sac. Musculoskeletal: Advanced degenerative changes of the spine with marked endplate changes at X3-K4. Grade 1 anterolisthesis of L4 on L5. IMPRESSION: 1. Mild edema and diffuse hazy infiltration surrounding the pancreas, consistent with acute pancreatitis. No organizing fluid collections. Stable 17 mm cystic lesion at the inferior head of the  pancreas. 2. Gallstones 3. Stable septated cyst within the mid to lower pole of the left kidney. Nonobstructing stone in the right kidney. 4. Circumferential wall thickening of the distal esophagus may reflect an esophagitis. 5. Moderate fat containing periumbilical hernia with some fluid and edema in the hernia sac. Electronically Signed   By: Donavan Foil M.D.   On: 06/07/2017 02:24     EKG: Independently reviewed. Sinus rhythm, QTC 459, LAD,  No T-wave peaking.  Assessment/Plan Principal Problem:  Gallstone pancreatitis Active Problems:   HYPERCHOLESTEROLEMIA   Essential hypertension   History of deep venous thrombosis   Sepsis (HCC)   Pancreatic cyst   Hyperkalemia   Abnormal LFTs   UTI (urinary tract infection)   Choledocholithiasis   Chronic diastolic CHF (congestive heart failure) (HCC)   Gallstone pancreatitis and possible choledocholithiasis. Lipase 343. US showed slightly enlarged common bile duct up to 8.8 mm, indicating possible choledocholithiasis. Patient has no fever, but has leukocytosis. Pt meets the criteria for sepsis with leukocytosis and tachypnea. Lactic acid is normal. Patient also has UTI which may have contributed partially to sepsis.  -will admit to tele bed as inpt. -start Zosyn IV  - When necessary Zofran for nausea, morphine for pain   -IVF: 2 L normal saline, then 100 mL per hour -Blood culture -Please call GI in morning.  UTI: -on zosyn -f/u urine culture  Abnormal LFTs: Likely due to gallstone -Check hepatitis panel -Hold Zocor -Avoid using Tylenol  Essential hypertension: -IV hydralazine when necessary -Hold torsemide due to sepsis  History of deep venous thrombosis:  -Switch pradaxa to IV heparin in case pt needs precedure  Pancreatic cyst: pt has a stable 17 mm cystic lesion at the inferior head of the pancreas. -f/u with PCP  Mild Hyperkalemia: K=5.2 -Kayexalate 15 g -Hold potassium chloride  Chronic diastolic congestive  heart failure: 2-D echo on 09/08/09 showed EF of 55-60 percent with grade 1 diastolic dysfunction. Patient does not have leg edema or JVD. CHF is compensated. -Hold torsemide -Check  BNP  HLD: -hold zocor due to abnormal liver function   DVT ppx: On IV heparin Code Status: Full code Family Communication:  Yes, patient's sister at bed side Disposition Plan:  Anticipate discharge back to previous home environment Consults called:  none Admission status:  Inpatient/tele    Date of Service 06/07/2017    Ivor Costa [facility] Pager [phone]  If 7PM-7AM, please contact night-coverage [email or web address] Password [email or web address] 06/07/2017, 7:50 AM

## 2017-06-07 NOTE — ED Notes (Signed)
Returned from CT.

## 2017-06-07 NOTE — Consult Note (Signed)
Reason for Consult: Gallstone pancreatitis Referring Physician:  Wardell Heath Chief complaint: Abdominal pain nausea and vomiting 3 days  Julie Mora is an 76 y.o. female.   HPI: Patient presented to the ED via EMS yesterday with nausea and vomiting. Symptoms started a week ago but a bit worse over the last 72 hours. Unable to tolerate food or liquids. She has lower abdominal pain.  Workup in the emergency department reveals she was afebrile and vital signs are stable. Blood pressure borderline diastolic elevations. CMP shows a potassium of 5.2. A glucose of 3:30. BUN of 25, alkaline phosphatase 153, lipase 343, AST 355, ALT 438, total bilirubin 4.6. WBC 15.5, hemoglobin 15.6, hematocrit 46.2, platelets 1 71,000, INR 1.14, PTT is 32. Urinalysis positive for nitrates, and probable UTI. CT of the abdomen and pelvis with contrast, 06/07/17: Multiple calcified gallstones no focal hepatic abnormality or biliary dilatation. Pancreas shows mild diffuse edema around the pancreas consistent with acute pancreatitis. No acute organizing fluid collections a small cystic lesion noted inferior pancreatic head. Nonobstructing stone in the right kidney. Thickening of the distal esophagus with possible esophagitis. Periumbilical fat-containing hernia with some fluid and edema.  Abdominal ultrasound shows multiple shadowing stones measuring up to 1.3 cm. Slightly thickened walls, bile duct was enlarged 8.8 mm.   She was admitted by medicine with gallstone pancreatitis. We are asked to see. Labs today showed the LFTs are improving bilirubin is down to 2.4. WBC is elevated at 16.6. H&H is stable. Urine and blood cultures are pending.  PRADAXA - LAST DOSE 06/05/17  MRI: Edematous pancreas with peripancreatic fluid consistent acute pancreatitis. No evidence of pancreatic necrosis. No organized fluid collections. No variant pancreatic ductal anatomy. 2. Several small cystic lesions in the head of pancreas likely relate to  chronic inflammation or indolent cystic neoplasms. No change from prior. 3. Multiple gallstones within the lumen gallbladder. No common bile duct stone or biliary obstruction.   Past Medical History:  Diagnosis Date  . Anemia   . Essential hypertension, benign   . Incontinence   . Other and unspecified hyperlipidemia   . Pemphigus foliaceous   . Renal insufficiency   . Type II or unspecified type diabetes mellitus without mention of complication, not stated as uncontrolled   . Vitamin D deficiency     No past surgical history on file.  Family History  Problem Relation Age of Onset  . Cancer Father     Social History:  reports that she has never smoked. She has never used smokeless tobacco. She reports that she does not drink alcohol or use drugs. 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 Azathioprine Shortness Of Breath  . Cellcept [Mycophenolate Mofetil] Nausea Only    severe nausea  . Ramipril Cough    Medications:  Prior to Admission:  Prescriptions Prior to Admission  Medication Sig Dispense Refill Last Dose  . acetaminophen (TYLENOL) 500 MG tablet Take 500 mg by mouth every 6 (six) hours as needed for pain.   Past Week at Unknown time  . alendronate (FOSAMAX) 70 MG tablet TAKE 1 TABLET(70 MG) BY MOUTH EVERY 7 DAYS WITH A FULL GLASS OF WATER AND ON AN EMPTY STOMACH 4 tablet 2 Past Week at Unknown time  . CALCIUM ASCORBATE PO Take 1 tablet by mouth daily.   Past Week at Unknown time  .  Cholecalciferol (VITAMIN D) 2000 UNITS tablet Take 2,000 Units by mouth daily.     Past Week at Unknown time  . ferrous gluconate (FERGON) 324 MG tablet Take 1 tablet (324 mg total) by mouth daily with breakfast. 90 tablet 3 Past Week at Unknown time  . gabapentin (NEURONTIN) 300 MG capsule take 1 capsule at bedtime  11 Past Week at Unknown time  .  insulin aspart (NOVOLOG) 100 UNIT/ML injection Inject 5-12 Units into the skin 2 (two) times daily as needed for high blood sugar. (Patient taking differently: Inject 4-14 Units into the skin 3 (three) times daily with meals. ) 3 vial PRN Past Week at Unknown time  . Insulin Glargine (LANTUS SOLOSTAR) 100 UNIT/ML Solostar Pen Inject 35 units in skin at bedtime (Patient taking differently: Inject 20-45 Units into the skin 2 (two) times daily. 20 units in the AM and  45 units in the PM) 15 mL prn Past Week at Unknown time  . Lactobacillus Rhamnosus, GG, (CULTURELLE PO) Take 1 capsule by mouth daily.   Past Week at Unknown time  . multivitamin (THERAGRAN) per tablet Take 1 tablet by mouth daily.     Past Week at Unknown time  . nystatin cream (MYCOSTATIN) Apply 1 application topically 2 (two) times daily. 30 g 0 Past Week at Unknown time  . Polyethyl Glycol-Propyl Glycol (SYSTANE) 0.4-0.3 % SOLN Apply 1 drop to eye 3 (three) times daily. Both eyes, as needed   Past Week at Unknown time  . potassium chloride (K-DUR) 10 MEQ tablet Take 1 tablet (10 mEq total) by mouth daily. 90 tablet 3 Past Week at Unknown time  . PRADAXA 75 MG CAPS capsule TAKE 1 CAPSULE(75 MG) BY MOUTH TWICE DAILY 60 capsule 2 06/05/2017 at 0000  . prednisoLONE acetate (PRED FORTE) 1 % ophthalmic suspension Place 1 drop into both eyes 2 (two) times daily.    Past Week at Unknown time  . simvastatin (ZOCOR) 20 MG tablet TAKE 1 TABLET BY MOUTH EVERY NIGHT AT BEDTIME 30 tablet 2 Past Week at Unknown time  . torsemide (DEMADEX) 20 MG tablet Take 1 tablet (20 mg total) by mouth daily. 90 tablet 3 Past Week at Unknown time  . ACCU-CHEK AVIVA PLUS test strip USE TO CHECK BLOOD SUGAR BEFORE BREAKFAST AND SUPPER 50 each prn Taking  . amoxicillin-clavulanate (AUGMENTIN) 500-125 MG tablet Take 1 tablet (500 mg total) by mouth 3 (three) times daily. (Patient not taking: Reported on 06/07/2017) 30 tablet 0 Not Taking at Unknown time  . BD PEN NEEDLE  NANO U/F 32G X 4 MM MISC USE AS DIRECTED 100 each prn Taking  . ciprofloxacin (CIPRO) 250 MG tablet Take 1 tablet (250 mg total) by mouth 2 (two) times daily. (Patient not taking: Reported on 06/07/2017) 20 tablet 0 Not Taking at Unknown time  . ferrous gluconate (FERGON) 325 MG tablet Take 1 tablet (325 mg total) by mouth daily with breakfast. (Patient not taking: Reported on 06/07/2017) 30 tablet 0 Not Taking at Unknown time   Continuous: . sodium chloride 100 mL/hr at 06/07/17 0456  . heparin 1,100 Units/hr (06/07/17 0529)  . piperacillin-tazobactam (ZOSYN)  IV     Anti-infectives    Start     Dose/Rate Route Frequency Ordered Stop   06/07/17 1200  piperacillin-tazobactam (ZOSYN) IVPB 3.375 g     3.375 g 12.5 mL/hr over 240 Minutes Intravenous Every 8 hours 06/07/17 0421     06/07/17 0345  piperacillin-tazobactam (ZOSYN) IVPB 3.375 g  3.375 g 100 mL/hr over 30 Minutes Intravenous NOW 06/07/17 0335 06/07/17 0408   06/07/17 0330  cefTRIAXone (ROCEPHIN) 1 g in dextrose 5 % 50 mL IVPB  Status:  Discontinued     1 g 100 mL/hr over 30 Minutes Intravenous  Once 06/07/17 0326 06/07/17 0333      Results for orders placed or performed during the hospital encounter of 06/06/17 (from the past 48 hour(s))  Lipase, blood     Status: Abnormal   Collection Time: 06/06/17 11:20 PM  Result Value Ref Range   Lipase 343 (H) 11 - 51 U/L  Comprehensive metabolic panel     Status: Abnormal   Collection Time: 06/06/17 11:20 PM  Result Value Ref Range   Sodium 137 135 - 145 mmol/L   Potassium 5.2 (H) 3.5 - 5.1 mmol/L   Chloride 104 101 - 111 mmol/L   CO2 21 (L) 22 - 32 mmol/L   Glucose, Bld 330 (H) 65 - 99 mg/dL   BUN 25 (H) 6 - 20 mg/dL   Creatinine, Ser 0.78 0.44 - 1.00 mg/dL   Calcium 9.2 8.9 - 10.3 mg/dL   Total Protein 7.0 6.5 - 8.1 g/dL   Albumin 3.4 (L) 3.5 - 5.0 g/dL   AST 355 (H) 15 - 41 U/L    Comment: RESULTS CONFIRMED BY MANUAL DILUTION   ALT 438 (H) 14 - 54 U/L   Alkaline  Phosphatase 153 (H) 38 - 126 U/L   Total Bilirubin 4.6 (H) 0.3 - 1.2 mg/dL   GFR calc non Af Amer >60 >60 mL/min   GFR calc Af Amer >60 >60 mL/min    Comment: (NOTE) The eGFR has been calculated using the CKD EPI equation. This calculation has not been validated in all clinical situations. eGFR's persistently <60 mL/min signify possible Chronic Kidney Disease.    Anion gap 12 5 - 15  CBC     Status: Abnormal   Collection Time: 06/06/17 11:20 PM  Result Value Ref Range   WBC 15.5 (H) 4.0 - 10.5 K/uL   RBC 4.76 3.87 - 5.11 MIL/uL   Hemoglobin 15.6 (H) 12.0 - 15.0 g/dL   HCT 46.2 (H) 36.0 - 46.0 %   MCV 97.1 78.0 - 100.0 fL   MCH 32.8 26.0 - 34.0 pg   MCHC 33.8 30.0 - 36.0 g/dL   RDW 12.8 11.5 - 15.5 %   Platelets 171 150 - 400 K/uL  Protime-INR     Status: None   Collection Time: 06/06/17 11:20 PM  Result Value Ref Range   Prothrombin Time 14.6 11.4 - 15.2 seconds   INR 1.14   POC CBG, ED     Status: Abnormal   Collection Time: 06/06/17 11:30 PM  Result Value Ref Range   Glucose-Capillary 312 (H) 65 - 99 mg/dL  I-Stat Troponin, ED (not at [facility])     Status: None   Collection Time: 06/06/17 11:41 PM  Result Value Ref Range   Troponin i, poc 0.01 0.00 - 0.08 ng/mL   Comment 3            Comment: Due to the release kinetics of cTnI, a negative result within the first hours of the onset of symptoms does not rule out myocardial infarction with certainty. If myocardial infarction is still suspected, repeat the test at appropriate intervals.   Urinalysis, Routine w reflex microscopic     Status: Abnormal   Collection Time: 06/07/17  2:34 AM  Result Value Ref Range  Color, Urine YELLOW YELLOW   APPearance HAZY (A) CLEAR   Specific Gravity, Urine 1.021 1.005 - 1.030   pH 6.0 5.0 - 8.0   Glucose, UA >=500 (A) NEGATIVE mg/dL   Hgb urine dipstick SMALL (A) NEGATIVE   Bilirubin Urine NEGATIVE NEGATIVE   Ketones, ur 5 (A) NEGATIVE mg/dL   Protein, ur NEGATIVE NEGATIVE mg/dL    Nitrite POSITIVE (A) NEGATIVE   Leukocytes, UA LARGE (A) NEGATIVE   RBC / HPF 0-5 0 - 5 RBC/hpf   WBC, UA TOO NUMEROUS TO COUNT 0 - 5 WBC/hpf   Bacteria, UA RARE (A) NONE SEEN   Squamous Epithelial / LPF 0-5 (A) NONE SEEN   Amorphous Crystal PRESENT   Comprehensive metabolic panel     Status: Abnormal   Collection Time: 06/07/17  5:52 AM  Result Value Ref Range   Sodium 141 135 - 145 mmol/L   Potassium 4.2 3.5 - 5.1 mmol/L    Comment: DELTA CHECK NOTED   Chloride 107 101 - 111 mmol/L   CO2 24 22 - 32 mmol/L   Glucose, Bld 279 (H) 65 - 99 mg/dL   BUN 24 (H) 6 - 20 mg/dL   Creatinine, Ser 0.89 0.44 - 1.00 mg/dL   Calcium 8.6 (L) 8.9 - 10.3 mg/dL   Total Protein 6.5 6.5 - 8.1 g/dL   Albumin 3.1 (L) 3.5 - 5.0 g/dL   AST 221 (H) 15 - 41 U/L   ALT 363 (H) 14 - 54 U/L   Alkaline Phosphatase 146 (H) 38 - 126 U/L   Total Bilirubin 2.4 (H) 0.3 - 1.2 mg/dL   GFR calc non Af Amer >60 >60 mL/min   GFR calc Af Amer >60 >60 mL/min    Comment: (NOTE) The eGFR has been calculated using the CKD EPI equation. This calculation has not been validated in all clinical situations. eGFR's persistently <60 mL/min signify possible Chronic Kidney Disease.    Anion gap 10 5 - 15  CBC     Status: Abnormal   Collection Time: 06/07/17  5:52 AM  Result Value Ref Range   WBC 16.6 (H) 4.0 - 10.5 K/uL   RBC 4.46 3.87 - 5.11 MIL/uL   Hemoglobin 14.5 12.0 - 15.0 g/dL   HCT 43.7 36.0 - 46.0 %   MCV 98.0 78.0 - 100.0 fL   MCH 32.5 26.0 - 34.0 pg   MCHC 33.2 30.0 - 36.0 g/dL   RDW 13.0 11.5 - 15.5 %   Platelets 170 150 - 400 K/uL  APTT     Status: None   Collection Time: 06/07/17  5:52 AM  Result Value Ref Range   aPTT 32 24 - 36 seconds  Lactic acid, plasma     Status: None   Collection Time: 06/07/17  5:52 AM  Result Value Ref Range   Lactic Acid, Venous 1.6 0.5 - 1.9 mmol/L  Procalcitonin     Status: None   Collection Time: 06/07/17  5:52 AM  Result Value Ref Range   Procalcitonin 0.28 ng/mL     Comment:        Interpretation: PCT (Procalcitonin) <= 0.5 ng/mL: Systemic infection (sepsis) is not likely. Local bacterial infection is possible. (NOTE)         ICU PCT Algorithm               Non ICU PCT Algorithm    ----------------------------     ------------------------------         PCT <  0.25 ng/mL                 PCT < 0.1 ng/mL     Stopping of antibiotics            Stopping of antibiotics       strongly encouraged.               strongly encouraged.    ----------------------------     ------------------------------       PCT level decrease by               PCT < 0.25 ng/mL       >= 80% from peak PCT       OR PCT 0.25 - 0.5 ng/mL          Stopping of antibiotics                                             encouraged.     Stopping of antibiotics           encouraged.    ----------------------------     ------------------------------       PCT level decrease by              PCT >= 0.25 ng/mL       < 80% from peak PCT        AND PCT >= 0.5 ng/mL            Continuin g antibiotics                                              encouraged.       Continuing antibiotics            encouraged.    ----------------------------     ------------------------------     PCT level increase compared          PCT > 0.5 ng/mL         with peak PCT AND          PCT >= 0.5 ng/mL             Escalation of antibiotics                                          strongly encouraged.      Escalation of antibiotics        strongly encouraged.   Brain natriuretic peptide     Status: None   Collection Time: 06/07/17  5:52 AM  Result Value Ref Range   B Natriuretic Peptide 95.8 0.0 - 100.0 pg/mL    US Abdomen Complete  Result Date: 06/07/2017 CLINICAL DATA:  Elevated LFT EXAM: ABDOMEN ULTRASOUND COMPLETE COMPARISON:  CT 06/07/2017 FINDINGS: Gallbladder: Multiple shadowing stones, measuring up to 1.3 cm. Negative sonographic Murphy. Slightly thickened wall up to 6.9 mm. Common bile duct: Diameter:  Enlarged, measuring up to 8.8 mm Liver: Slight increased echogenicity. No focal hepatic abnormality. Portal vein is patent on color Doppler imaging with normal direction of blood flow towards the liver. IVC: No abnormality visualized. Pancreas: Poorly visualized. Spleen: Size and appearance within normal limits. Right Kidney:  Length: 12.1 cm. Cortical thinning. No hydronephrosis. Left Kidney: Length: 13.1 cm. Cortical thinning. No hydronephrosis. Septated cyst in the mid to lower pole measuring 6.1 x 4.2 x 4.7 cm. Abdominal aorta: Proximal aorta not seen. Mid and distal aortic segments are non dilated. Other findings: None. IMPRESSION: 1. Cholelithiasis with mild wall thickening but negative sonographic Murphy. Findings are nonspecific. Follow-up hepatobiliary nuclear medicine imaging could be performed if concern for an acute cholecystitis. 2. Slightly enlarged common bile duct up to 8.8 mm. If laboratory values suggest obstruction, further evaluation with MRCP could be considered 3. Slightly echogenic liver consistent with fatty infiltration 4. Cortical atrophy of the kidneys. 6.1 cm septated cyst in the mid to lower left kidney. Electronically Signed   By: Donavan Foil M.D.   On: 06/07/2017 02:29   Ct Abdomen Pelvis W Contrast  Result Date: 06/07/2017 CLINICAL DATA:  Abdominal pain with nausea and vomiting for 3 days, elevated white count and lipase EXAM: CT ABDOMEN AND PELVIS WITH CONTRAST TECHNIQUE: Multidetector CT imaging of the abdomen and pelvis was performed using the standard protocol following bolus administration of intravenous contrast. CONTRAST:  80 mL Isovue 300 intravenous COMPARISON:  MRI 05/01/2017, CT 07/23/2009 FINDINGS: Lower chest: Lung bases demonstrate no acute consolidation or pleural effusion. Mitral calcifications. Normal heart size. Mild circumferential thickening of the distal esophagus. Hepatobiliary: Multiple calcified gallstones. No focal hepatic abnormality or biliary  dilatation Pancreas: Mild diffuse edema around the pancreas consistent with acute pancreatitis. No organizing fluid collections. A small cystic lesion at the inferior pancreatic head measures 17 mm and was noted on comparison MRI. Lesions in the pancreatic tail better seen on MRI. Spleen: Normal in size without focal abnormality. Adrenals/Urinary Tract: Adrenal glands are normal. Subcentimeter hypodense lesions within both kidneys. Punctate nonobstructing stone mid right kidney. 5.3 cm septated cyst in the mid to lower left kidney re- demonstrated. Bladder normal. Stomach/Bowel: The stomach is nonenlarged. No dilated small bowel. No colon wall thickening. Vascular/Lymphatic: Aortic atherosclerosis. No enlarged abdominal or pelvic lymph nodes. Reproductive: Uterus and bilateral adnexa are unremarkable. Other: Negative for free air or free fluid. Moderate fat containing periumbilical hernia with small amount of fluid and stranding in the hernia sac. Musculoskeletal: Advanced degenerative changes of the spine with marked endplate changes at M5-Y6. Grade 1 anterolisthesis of L4 on L5. IMPRESSION: 1. Mild edema and diffuse hazy infiltration surrounding the pancreas, consistent with acute pancreatitis. No organizing fluid collections. Stable 17 mm cystic lesion at the inferior head of the pancreas. 2. Gallstones 3. Stable septated cyst within the mid to lower pole of the left kidney. Nonobstructing stone in the right kidney. 4. Circumferential wall thickening of the distal esophagus may reflect an esophagitis. 5. Moderate fat containing periumbilical hernia with some fluid and edema in the hernia sac. Electronically Signed   By: Donavan Foil M.D.   On: 06/07/2017 02:24    Review of Systems  Constitutional: Positive for weight loss (22 labs over the summer, she is not sure just how long). Negative for diaphoresis and malaise/fatigue.  HENT: Negative.   Eyes: Negative.   Respiratory: Negative.   Cardiovascular:  Positive for leg swelling. Negative for chest pain, palpitations, orthopnea, claudication and PND.  Gastrointestinal: Positive for abdominal pain, nausea and vomiting. Negative for blood in stool, constipation, diarrhea, heartburn and melena.  Genitourinary: Negative.   Musculoskeletal:       She blames crutches on weight and being lazy, she also has some diabetic neuropathy.  Skin:  Pemphigus, foliaceous  Flairs give her issues frequently  Neurological: Positive for weakness (uses crutches to walk she cannot tell me exactly why).  Endo/Heme/Allergies: Bruises/bleeds easily.  Psychiatric/Behavioral: Negative.    Blood pressure 113/70, pulse 89, temperature 99.4 F (37.4 C), temperature source Oral, resp. rate 18, height '5\' 4"'  (1.626 m), weight 112.3 kg (247 lb 9.2 oz), SpO2 95 %. Physical Exam  Constitutional: She is oriented to person, place, and time. She appears well-developed and well-nourished.  Obese woman in bed, incontinent currently of urine.   Body mass index is 42.5 kg/m.   HENT:  Head: Normocephalic and atraumatic.  Mouth/Throat: No oropharyngeal exudate.  Lips and tongue are really dry and chapping  Eyes: Right eye exhibits no discharge. Left eye exhibits no discharge. No scleral icterus.  Pupils are equal  Neck: Normal range of motion. Neck supple. No JVD present. No tracheal deviation present. No thyromegaly present.  Cardiovascular: Normal rate, regular rhythm and normal heart sounds.   ?With edema and skin changes in feet it's hard to feel pulses  Respiratory: Effort normal and breath sounds normal. No respiratory distress. She has no wheezes. She has no rales. She exhibits no tenderness.  GI: Soft. Bowel sounds are normal. She exhibits no distension and no mass. There is no tenderness. There is no rebound and no guarding.  Musculoskeletal: She exhibits edema and tenderness.  Lymphadenopathy:    She has no cervical adenopathy.  Neurological: She is alert and  oriented to person, place, and time. No cranial nerve deficit.  Skin: Skin is warm and dry.  Thickened skin lower legs and feet, venous stasis changes and some edema bilateral  Psychiatric: She has a normal mood and affect. Her behavior is normal. Judgment and thought content normal.    Assessment/Plan: Gallstone pancreatitis Elevated LFT's Hx of CVA - on Pradaxa  -  Last dose 06/05/17   Type II diabetes - poor control -  A1C 10.2  (10/03/16) Hx of CHF Body mass index is 42.5 Hx of incontinence  Pemphigus foliaceous Hx of renal insuffiencey    Plan:  She will need her GB out when stable.  Recheck labs in AM, MRI shows some ongoing edema even though she is not having pain on exam now.  She will need to be cleared medically, and ready for surgery from Medicine point of view.  Will make her NPO after MN for possible surgery tomorrow.  I have ask pharmacy to hold the Heparin at 3 AM.      Akshith Moncus 06/07/2017, 10:06 AM

## 2017-06-07 NOTE — ED Notes (Signed)
Please call report to [facility] at [phone] at [phone].

## 2017-06-07 NOTE — Progress Notes (Signed)
Patient seen and examined this morning, admitted by Dr. Blaine Hamper overnight.  In brief, this is a pleasant 76 year old female with history of hypertension, hyperlipidemia, diabetes, prior CVA, DVT on chronic anticoagulation who presented to the emergency room with abdominal pain, nausea vomiting.  She was found to have acute pancreatitis as well as cholelithiasis with a slightly dilated CBD.   Gallstone pancreatitis -Supportive treatment, IV fluids, pain control. SIRS likely due to pancreatitis rather than infection. -GI, general surgery consulted, ordered an MRCP which is pending. -Monitor LFTs  UTI -Cultures are pending, patient placed on Zosyn on admission, continue  Hypertension -Hydralazine as needed, hold torsemide  History of DVT -Keep on IV heparin for now  Chronic diastolic CHF -Stable, does not look fluid overloaded, hold torsemide for now   Costin M. Cruzita Lederer, MD [facility] [phone]  If 7PM-7AM, please contact night-coverage [email or web address] Password [email or web address]

## 2017-06-07 NOTE — Progress Notes (Signed)
Pharmacy Antibiotic Note  Julie Mora is a 76 y.o. female c/o nausea and vomiting admitted on 06/06/2017 with UTI and possible cholecystis  Pharmacy has been consulted for zosyn dosing.  Plan: Zosyn 3.375g IV q8h (4 hour infusion).     Temp (24hrs), Avg:98.8 F (37.1 C), Min:98.8 F (37.1 C), Max:98.8 F (37.1 C)   Recent Labs Lab 06/06/17 2320  WBC 15.5*  CREATININE 0.78    CrCl cannot be calculated (Unknown ideal weight.).    Allergies  Allergen Reactions  . Azathioprine Shortness Of Breath  . Cellcept [Mycophenolate Mofetil] Nausea Only    severe nausea  . Ramipril Cough    Antimicrobials this admission: 9/20 zosyn >>    >>   Dose adjustments this admission:   Microbiology results:  BCx:   UCx:    Sputum:    MRSA PCR:   Thank you for allowing pharmacy to be a part of this patient's care.  Dorrene German 06/07/2017 3:37 AM

## 2017-06-07 NOTE — ED Notes (Signed)
Ultrasound in progress  

## 2017-06-07 NOTE — Progress Notes (Signed)
ANTICOAGULATION CONSULT NOTE - f/u Consult  Pharmacy Consult for IV heparin Indication: pulmonary embolus  Allergies  Allergen Reactions  . Azathioprine Shortness Of Breath  . Cellcept [Mycophenolate Mofetil] Nausea Only    severe nausea  . Ramipril Cough    Patient Measurements: Height: 5\' 4"  (162.6 cm) Weight: 247 lb 9.2 oz (112.3 kg) IBW/kg (Calculated) : 54.7 Heparin Dosing Weight: 72 kg  Vital Signs: Temp: 98.8 F (37.1 C) (09/19 2342) Temp Source: Oral (09/20 0434) BP: 129/83 (09/20 0345) Pulse Rate: 90 (09/20 0345)  Labs:  Recent Labs  06/06/17 2320  HGB 15.6*  HCT 46.2*  PLT 171  LABPROT 14.6  INR 1.14  CREATININE 0.78    Estimated Creatinine Clearance: 74.5 mL/min (by C-G formula based on SCr of 0.78 mg/dL).   Medical History: Past Medical History:  Diagnosis Date  . Anemia   . Essential hypertension, benign   . Incontinence   . Other and unspecified hyperlipidemia   . Pemphigus foliaceous   . Renal insufficiency   . Type II or unspecified type diabetes mellitus without mention of complication, not stated as uncontrolled   . Vitamin D deficiency     Medications:  Scheduled:  . acidophilus   Oral Daily  . cholecalciferol  2,000 Units Oral Daily  . ferrous gluconate  324 mg Oral Q breakfast  . gabapentin  300 mg Oral QHS  . insulin aspart  0-9 Units Subcutaneous TID WC  . insulin glargine  10 Units Subcutaneous Daily  . insulin glargine  25 Units Subcutaneous QHS  . iopamidol      . multivitamin with minerals  1 tablet Oral Daily  . nystatin cream  1 application Topical BID  . polyvinyl alcohol  1 drop Both Eyes TID  . prednisoLONE acetate  1 drop Both Eyes BID  . sodium polystyrene  15 g Oral Once  . vitamin C   Oral Daily   Infusions:  . sodium chloride    . piperacillin-tazobactam (ZOSYN)  IV      Assessment: 78 yoF admitted with nausea and vomiting on chronic Pradaxa for hx of PE.  IV heparin per Rx while pradaxa on hold.   Baseline aptt pending Goal of Therapy:  Heparin level 0.3-0.7 units/ml Monitor platelets by anticoagulation protocol: Yes   Plan:  Start heparin drip at 1100 units/hr (11 ml/hr) Daily CBC/HL Check 1st HL in 8 hours  Lawana Pai R 06/07/2017,4:39 AM

## 2017-06-08 ENCOUNTER — Inpatient Hospital Stay (HOSPITAL_COMMUNITY): Payer: Medicare Other | Admitting: Certified Registered"

## 2017-06-08 ENCOUNTER — Encounter (HOSPITAL_COMMUNITY): Payer: Self-pay | Admitting: Certified Registered"

## 2017-06-08 ENCOUNTER — Encounter (HOSPITAL_COMMUNITY)
Admission: EM | Disposition: A | Discharge status: Home / Self Care | Payer: Self-pay | Source: Home / Self Care | Attending: Internal Medicine

## 2017-06-08 DIAGNOSIS — I10 Essential (primary) hypertension: Secondary | ICD-10-CM

## 2017-06-08 DIAGNOSIS — Z86718 Personal history of other venous thrombosis and embolism: Secondary | ICD-10-CM

## 2017-06-08 DIAGNOSIS — I5032 Chronic diastolic (congestive) heart failure: Secondary | ICD-10-CM

## 2017-06-08 DIAGNOSIS — K8050 Calculus of bile duct without cholangitis or cholecystitis without obstruction: Secondary | ICD-10-CM

## 2017-06-08 HISTORY — PX: CHOLECYSTECTOMY: SHX55

## 2017-06-08 LAB — COMPREHENSIVE METABOLIC PANEL
ALBUMIN: 2.6 g/dL — AB (ref 3.5–5.0)
ALT: 216 U/L — ABNORMAL HIGH (ref 14–54)
ANION GAP: 8 (ref 5–15)
AST: 73 U/L — AB (ref 15–41)
Alkaline Phosphatase: 121 U/L (ref 38–126)
BUN: 20 mg/dL (ref 6–20)
CHLORIDE: 107 mmol/L (ref 101–111)
CO2: 23 mmol/L (ref 22–32)
Calcium: 7.9 mg/dL — ABNORMAL LOW (ref 8.9–10.3)
Creatinine, Ser: 0.9 mg/dL (ref 0.44–1.00)
GFR calc Af Amer: >60 mL/min (ref >60)
Glucose, Bld: 187 mg/dL — ABNORMAL HIGH (ref 65–99)
POTASSIUM: 3.1 mmol/L — AB (ref 3.5–5.1)
Sodium: 138 mmol/L (ref 135–145)
Total Bilirubin: 1.1 mg/dL (ref 0.3–1.2)
Total Protein: 5.8 g/dL — ABNORMAL LOW (ref 6.5–8.1)

## 2017-06-08 LAB — CBC
HCT: 41.5 % (ref 36.0–46.0)
Hemoglobin: 13.6 g/dL (ref 12.0–15.0)
MCH: 31.7 pg (ref 26.0–34.0)
MCHC: 32.8 g/dL (ref 30.0–36.0)
MCV: 96.7 fL (ref 78.0–100.0)
PLATELETS: 143 10*3/uL — AB (ref 150–400)
RBC: 4.29 MIL/uL (ref 3.87–5.11)
RDW: 12.9 % (ref 11.5–15.5)
WBC: 18.5 10*3/uL — AB (ref 4.0–10.5)

## 2017-06-08 LAB — GLUCOSE, CAPILLARY
GLUCOSE-CAPILLARY: 305 mg/dL — AB (ref 65–99)
Glucose-Capillary: 155 mg/dL — ABNORMAL HIGH (ref 65–99)
Glucose-Capillary: 120 mg/dL — ABNORMAL HIGH (ref 65–99)
Glucose-Capillary: 127 mg/dL — ABNORMAL HIGH (ref 65–99)
Glucose-Capillary: 258 mg/dL — ABNORMAL HIGH (ref 65–99)
Glucose-Capillary: 290 mg/dL — ABNORMAL HIGH (ref 65–99)

## 2017-06-08 LAB — HEPATITIS PANEL, ACUTE
HEP B C IGM: NEGATIVE
HEP B S AG: NEGATIVE
Hep A IgM: NEGATIVE

## 2017-06-08 LAB — URINE CULTURE

## 2017-06-08 LAB — SURGICAL PCR SCREEN
MRSA, PCR: NEGATIVE
STAPHYLOCOCCUS AUREUS: POSITIVE — AB

## 2017-06-08 LAB — LIPASE, BLOOD: LIPASE: 27 U/L (ref 11–51)

## 2017-06-08 SURGERY — LAPAROSCOPIC CHOLECYSTECTOMY
Anesthesia: General | Site: Abdomen

## 2017-06-08 MED ORDER — BUPIVACAINE-EPINEPHRINE (PF) 0.25% -1:200000 IJ SOLN
INTRAMUSCULAR | Status: AC
  Filled 2017-06-08: qty 30

## 2017-06-08 MED ORDER — LACTATED RINGERS IV SOLN
INTRAVENOUS | Status: DC
  Administered 2017-06-08: 11:00:00 via INTRAVENOUS

## 2017-06-08 MED ORDER — PROPOFOL 10 MG/ML IV BOLUS
INTRAVENOUS | Status: DC | PRN
  Administered 2017-06-08: 100 mg via INTRAVENOUS

## 2017-06-08 MED ORDER — HEPARIN (PORCINE) IN NACL 100-0.45 UNIT/ML-% IJ SOLN
1100.0000 [IU]/h | INTRAMUSCULAR | Status: DC
  Administered 2017-06-08: 1100 [IU]/h via INTRAVENOUS
  Filled 2017-06-08: qty 250

## 2017-06-08 MED ORDER — FENTANYL CITRATE (PF) 100 MCG/2ML IJ SOLN: 25.0000 ug | INTRAMUSCULAR | Status: DC | PRN

## 2017-06-08 MED ORDER — DEXAMETHASONE SODIUM PHOSPHATE 10 MG/ML IJ SOLN
INTRAMUSCULAR | Status: DC | PRN
  Administered 2017-06-08: 5 mg via INTRAVENOUS

## 2017-06-08 MED ORDER — SUGAMMADEX SODIUM 200 MG/2ML IV SOLN
INTRAVENOUS | Status: DC | PRN
  Administered 2017-06-08: 300 mg via INTRAVENOUS

## 2017-06-08 MED ORDER — POTASSIUM CHLORIDE 10 MEQ/100ML IV SOLN
10.0000 meq | INTRAVENOUS | Status: AC
  Administered 2017-06-08 (×2): 10 meq via INTRAVENOUS
  Filled 2017-06-08 (×2): qty 100

## 2017-06-08 MED ORDER — PIPERACILLIN-TAZOBACTAM 3.375 G IVPB
INTRAVENOUS | Status: AC
  Filled 2017-06-08: qty 50

## 2017-06-08 MED ORDER — FENTANYL CITRATE (PF) 250 MCG/5ML IJ SOLN
INTRAMUSCULAR | Status: AC
Start: 2017-06-08 — End: ?
  Filled 2017-06-08: qty 5

## 2017-06-08 MED ORDER — PROPOFOL 10 MG/ML IV BOLUS
INTRAVENOUS | Status: AC
  Filled 2017-06-08: qty 20

## 2017-06-08 MED ORDER — LIDOCAINE 2% (20 MG/ML) 5 ML SYRINGE
INTRAMUSCULAR | Status: DC | PRN
  Administered 2017-06-08: 100 mg via INTRAVENOUS

## 2017-06-08 MED ORDER — HYDROCODONE-ACETAMINOPHEN 5-325 MG PO TABS
1.0000 | ORAL_TABLET | ORAL | Status: DC | PRN
Start: 2017-06-08 — End: 2017-06-12
  Administered 2017-06-09 – 2017-06-11 (×5): 1 via ORAL
  Filled 2017-06-08 (×5): qty 1

## 2017-06-08 MED ORDER — LACTATED RINGERS IR SOLN
Status: DC | PRN
  Administered 2017-06-08: 1000 mL

## 2017-06-08 MED ORDER — BUPIVACAINE-EPINEPHRINE 0.25% -1:200000 IJ SOLN
INTRAMUSCULAR | Status: DC | PRN
  Administered 2017-06-08: 20 mL

## 2017-06-08 MED ORDER — DEXAMETHASONE SODIUM PHOSPHATE 10 MG/ML IJ SOLN
INTRAMUSCULAR | Status: AC
  Filled 2017-06-08: qty 1

## 2017-06-08 MED ORDER — ONDANSETRON HCL 4 MG/2ML IJ SOLN
INTRAMUSCULAR | Status: DC | PRN
  Administered 2017-06-08: 4 mg via INTRAVENOUS

## 2017-06-08 MED ORDER — MIDAZOLAM HCL 2 MG/2ML IJ SOLN
INTRAMUSCULAR | Status: DC | PRN
  Administered 2017-06-08: 1 mg via INTRAVENOUS

## 2017-06-08 MED ORDER — LACTATED RINGERS IV SOLN
INTRAVENOUS | Status: DC | PRN
  Administered 2017-06-08 (×2): via INTRAVENOUS

## 2017-06-08 MED ORDER — PHENYLEPHRINE 40 MCG/ML (10ML) SYRINGE FOR IV PUSH (FOR BLOOD PRESSURE SUPPORT)
PREFILLED_SYRINGE | INTRAVENOUS | Status: DC | PRN
  Administered 2017-06-08 (×2): 120 ug via INTRAVENOUS
  Administered 2017-06-08: 40 ug via INTRAVENOUS
  Administered 2017-06-08: 120 ug via INTRAVENOUS

## 2017-06-08 MED ORDER — 0.9 % SODIUM CHLORIDE (POUR BTL) OPTIME
TOPICAL | Status: DC | PRN
  Administered 2017-06-08: 1000 mL

## 2017-06-08 MED ORDER — PHENYLEPHRINE 40 MCG/ML (10ML) SYRINGE FOR IV PUSH (FOR BLOOD PRESSURE SUPPORT)
PREFILLED_SYRINGE | INTRAVENOUS | Status: AC
  Filled 2017-06-08: qty 10

## 2017-06-08 MED ORDER — FENTANYL CITRATE (PF) 250 MCG/5ML IJ SOLN
INTRAMUSCULAR | Status: DC | PRN
  Administered 2017-06-08 (×3): 50 ug via INTRAVENOUS

## 2017-06-08 MED ORDER — MIDAZOLAM HCL 2 MG/2ML IJ SOLN
INTRAMUSCULAR | Status: AC
  Filled 2017-06-08: qty 2

## 2017-06-08 MED ORDER — SUGAMMADEX SODIUM 200 MG/2ML IV SOLN
INTRAVENOUS | Status: AC
  Filled 2017-06-08: qty 2

## 2017-06-08 MED ORDER — PHENYLEPHRINE HCL 10 MG/ML IJ SOLN
INTRAMUSCULAR | Status: DC | PRN
  Administered 2017-06-08: 80 ug via INTRAVENOUS

## 2017-06-08 MED ORDER — ROCURONIUM BROMIDE 10 MG/ML (PF) SYRINGE
PREFILLED_SYRINGE | INTRAVENOUS | Status: DC | PRN
  Administered 2017-06-08: 50 mg via INTRAVENOUS

## 2017-06-08 MED ORDER — POTASSIUM CHLORIDE 10 MEQ/100ML IV SOLN
10.0000 meq | INTRAVENOUS | Status: AC
  Administered 2017-06-08 (×2): 10 meq via INTRAVENOUS
  Filled 2017-06-08 (×4): qty 100

## 2017-06-08 MED ORDER — ONDANSETRON HCL 4 MG/2ML IJ SOLN: 4.0000 mg | Freq: Once | INTRAMUSCULAR | Status: DC | PRN

## 2017-06-08 SURGICAL SUPPLY — 44 items
APL SKNCLS STERI-STRIP NONHPOA (GAUZE/BANDAGES/DRESSINGS) ×1
APPLIER CLIP ROT 10 11.4 M/L (STAPLE)
APR CLP MED LRG 11.4X10 (STAPLE)
BAG SPEC RTRVL 10 TROC 200 (ENDOMECHANICALS) ×1
BANDAGE ADH SHEER 1  50/CT (GAUZE/BANDAGES/DRESSINGS) ×15 IMPLANT
BENZOIN TINCTURE PRP APPL 2/3 (GAUZE/BANDAGES/DRESSINGS) ×3 IMPLANT
CABLE HIGH FREQUENCY MONO STRZ (ELECTRODE) ×3 IMPLANT
CATH CHOLANG 76X19 KUMAR (CATHETERS) IMPLANT
CHLORAPREP W/TINT 26ML (MISCELLANEOUS) ×3 IMPLANT
CLIP APPLIE ROT 10 11.4 M/L (STAPLE) IMPLANT
CLIP VESOLOCK LG 6/CT PURPLE (CLIP) IMPLANT
CLIP VESOLOCK XL 6/CT (CLIP) IMPLANT
CLOSURE WOUND 1/2 X4 (GAUZE/BANDAGES/DRESSINGS) ×1
COVER MAYO STAND STRL (DRAPES) IMPLANT
COVER SURGICAL LIGHT HANDLE (MISCELLANEOUS) ×3 IMPLANT
DECANTER SPIKE VIAL GLASS SM (MISCELLANEOUS) ×1 IMPLANT
DRAIN CHANNEL 19F RND (DRAIN) IMPLANT
DRAPE C-ARM 42X120 X-RAY (DRAPES) IMPLANT
EVACUATOR SILICONE 100CC (DRAIN) IMPLANT
GLOVE BIOGEL PI IND STRL 7.0 (GLOVE) ×1 IMPLANT
GLOVE BIOGEL PI INDICATOR 7.0 (GLOVE) ×2
GLOVE SURG SS PI 7.0 STRL IVOR (GLOVE) ×3 IMPLANT
GOWN STRL REUS W/TWL LRG LVL3 (GOWN DISPOSABLE) ×3 IMPLANT
GOWN STRL REUS W/TWL XL LVL3 (GOWN DISPOSABLE) ×6 IMPLANT
GRASPER SUT TROCAR 14GX15 (MISCELLANEOUS) ×3 IMPLANT
IRRIG SUCT STRYKERFLOW 2 WTIP (MISCELLANEOUS) ×3
IRRIGATION SUCT STRKRFLW 2 WTP (MISCELLANEOUS) ×1 IMPLANT
KIT BASIN OR (CUSTOM PROCEDURE TRAY) ×3 IMPLANT
POUCH RETRIEVAL ECOSAC 10 (ENDOMECHANICALS) ×1 IMPLANT
POUCH RETRIEVAL ECOSAC 10MM (ENDOMECHANICALS) ×2
SCISSORS LAP 5X35 DISP (ENDOMECHANICALS) ×3 IMPLANT
SHEARS HARMONIC ACE PLUS 36CM (ENDOMECHANICALS) IMPLANT
SLEEVE XCEL OPT CAN 5 100 (ENDOMECHANICALS) ×6 IMPLANT
STOPCOCK 4 WAY LG BORE MALE ST (IV SETS) ×3 IMPLANT
STRIP CLOSURE SKIN 1/2X4 (GAUZE/BANDAGES/DRESSINGS) ×2 IMPLANT
SUT ETHILON 2 0 PS N (SUTURE) IMPLANT
SUT MNCRL AB 4-0 PS2 18 (SUTURE) ×3 IMPLANT
SUT VICRYL 0 ENDOLOOP (SUTURE) IMPLANT
TOWEL OR 17X26 10 PK STRL BLUE (TOWEL DISPOSABLE) ×3 IMPLANT
TOWEL OR NON WOVEN STRL DISP B (DISPOSABLE) ×3 IMPLANT
TRAY LAPAROSCOPIC (CUSTOM PROCEDURE TRAY) ×3 IMPLANT
TROCAR BLADELESS OPT 5 100 (ENDOMECHANICALS) ×3 IMPLANT
TROCAR XCEL 12X100 BLDLESS (ENDOMECHANICALS) ×3 IMPLANT
TUBING INSUF HEATED (TUBING) ×3 IMPLANT

## 2017-06-08 NOTE — Progress Notes (Signed)
Pre Procedure note for inpatients:   Julie Mora has been scheduled for Procedure(s): LAPAROSCOPIC CHOLECYSTECTOMY (N/A) today. The various methods of treatment have been discussed with the patient. After consideration of the risks, benefits and treatment options the patient has consented to the planned procedure. WBC elevated but abdominal pain still minimal.  The patient has been seen and labs reviewed. There are no changes in the patient's condition to prevent proceeding with the planned procedure today.  Recent labs:  Lab Results  Component Value Date   WBC 18.5 (H) 06/08/2017   HGB 13.6 06/08/2017   HCT 41.5 06/08/2017   PLT 143 (L) 06/08/2017   GLUCOSE 187 (H) 06/08/2017   CHOL 132 10/03/2016   TRIG 158 (H) 10/03/2016   HDL 44 (L) 10/03/2016   LDLCALC 56 10/03/2016   ALT 216 (H) 06/08/2017   AST 73 (H) 06/08/2017   NA 138 06/08/2017   K 3.1 (L) 06/08/2017   CL 107 06/08/2017   CREATININE 0.90 06/08/2017   BUN 20 06/08/2017   CO2 23 06/08/2017   TSH 2.29 10/03/2016   INR 1.14 06/06/2017   HGBA1C 11.8 (H) 01/21/2017   MICROALBUR 21.4 10/03/2016    Mickeal Skinner, MD 06/08/2017 8:52 AM

## 2017-06-08 NOTE — Progress Notes (Signed)
PROGRESS NOTE  Julie Mora QIW:[id number] DOB: 12-16-1940 DOA: 06/06/2017 PCP: Elby Showers, MD   LOS: 1 day   Brief Narrative / Interim history: pleasant 76 year old female with history of hypertension, hyperlipidemia, diabetes, prior CVA, DVT on chronic anticoagulation who presented to the emergency room with abdominal pain, nausea vomiting.  She was found to have acute pancreatitis as well as cholelithiasis with a slightly dilated CBD.  Assessment & Plan: Principal Problem:   Gallstone pancreatitis Active Problems:   HYPERCHOLESTEROLEMIA   Essential hypertension   History of deep venous thrombosis   Sepsis (HCC)   Pancreatic cyst   Hyperkalemia   Abnormal LFTs   UTI (urinary tract infection)   Choledocholithiasis   Chronic diastolic CHF (congestive heart failure) (HCC)   Gallstone pancreatitis -Supportive treatment, IV fluids, pain control. SIRS likely due to pancreatitis rather than infection. -Improving, she underwent an MRCP on 9/20 which was negative for stone in CBD -Patient will undergo laparoscopic cholecystectomy today -Liver enzymes are improving, bilirubin has now normalized.  UTI -patient placed on Zosyn on admission, continue, will cover for GI source as well, urine cultures with multiple species, final  Hypertension -Hydralazine as needed, hold torsemide  History of DVT -Resume anticoagulation when ok with gastroenterology  Chronic diastolic CHF -Stable, does not look fluid overloaded, hold torsemide for now   DVT prophylaxis: Per general surgery Code Status: Full code Family Communication: No family at bedside Disposition Plan: Home when ready  Consultants:   Gastroenterology  General surgery  Procedures:   none  Antimicrobials:  Zosyn 9/20 >>   Subjective: - no chest pain, shortness of breath, no abdominal pain, nausea or vomiting.  Awaiting surgery  Objective: Vitals:   06/08/17 1230 06/08/17 1245 06/08/17 1300 06/08/17  1317  BP: 117/71 121/68 115/70 115/69  Pulse: (!) 106 99 94 91  Resp: (!) 36 (!) 35 17 (!) 26  Temp: 98.5 F (36.9 C)  98.8 F (37.1 C) 98.9 F (37.2 C)  TempSrc:      SpO2: 90% 95% 93% 94%  Weight:      Height:        Intake/Output Summary (Last 24 hours) at 06/08/17 1400 Last data filed at 06/08/17 1300  Gross per 24 hour  Intake          3173.53 ml  Output              520 ml  Net          2653.53 ml   Filed Weights   06/07/17 0434  Weight: 112.3 kg (247 lb 9.2 oz)    Examination:  Constitutional: NAD Eyes:  lids and conjunctivae normal Respiratory: clear to auscultation bilaterally, no wheezing, no crackles. Normal respiratory effort.  Cardiovascular: Regular rate and rhythm, no murmurs / rubs / gallops. 1+LE edema.  Abdomen: no tenderness. Bowel sounds positive.  Skin: Chronic venous stasis changes bilateral lower extremities Neurologic: CN 2-12 grossly intact. Strength 5/5 in all 4.  Psychiatric: Normal judgment and insight. Alert and oriented x 3. Normal mood.    Data Reviewed: I have independently reviewed following labs and imaging studies   CBC:  Recent Labs Lab 06/06/17 2320 06/07/17 0552 06/08/17 0435  WBC 15.5* 16.6* 18.5*  HGB 15.6* 14.5 13.6  HCT 46.2* 43.7 41.5  MCV 97.1 98.0 96.7  PLT 171 170 417*   Basic Metabolic Panel:  Recent Labs Lab 06/06/17 2320 06/07/17 0552 06/08/17 0435  NA 137 141 138  K 5.2* 4.2 3.1*  CL 104 107 107  CO2 21* 24 23  GLUCOSE 330* 279* 187*  BUN 25* 24* 20  CREATININE 0.78 0.89 0.90  CALCIUM 9.2 8.6* 7.9*   GFR: Estimated Creatinine Clearance: 66.2 mL/min (by C-G formula based on SCr of 0.9 mg/dL). Liver Function Tests:  Recent Labs Lab 06/06/17 2320 06/07/17 0552 06/08/17 0435  AST 355* 221* 73*  ALT 438* 363* 216*  ALKPHOS 153* 146* 121  BILITOT 4.6* 2.4* 1.1  PROT 7.0 6.5 5.8*  ALBUMIN 3.4* 3.1* 2.6*    Recent Labs Lab 06/06/17 2320 06/08/17 0435  LIPASE 343* 27   No results for  input(s): AMMONIA in the last 168 hours. Coagulation Profile:  Recent Labs Lab 06/06/17 2320  INR 1.14   Cardiac Enzymes: No results for input(s): CKTOTAL, CKMB, CKMBINDEX, TROPONINI in the last 168 hours. BNP (last 3 results) No results for input(s): PROBNP in the last 8760 hours. HbA1C: No results for input(s): HGBA1C in the last 72 hours. CBG:  Recent Labs Lab 06/07/17 1705 06/07/17 2051 06/08/17 0743 06/08/17 1048 06/08/17 1233  GLUCAP 305* 252* 155* 120* 127*   Lipid Profile: No results for input(s): CHOL, HDL, LDLCALC, TRIG, CHOLHDL, LDLDIRECT in the last 72 hours. Thyroid Function Tests: No results for input(s): TSH, T4TOTAL, FREET4, T3FREE, THYROIDAB in the last 72 hours. Anemia Panel: No results for input(s): VITAMINB12, FOLATE, FERRITIN, TIBC, IRON, RETICCTPCT in the last 72 hours. Urine analysis:    Component Value Date/Time   COLORURINE YELLOW 06/07/2017 0234   APPEARANCEUR HAZY (A) 06/07/2017 0234   LABSPEC 1.021 06/07/2017 0234   PHURINE 6.0 06/07/2017 0234   GLUCOSEU >=500 (A) 06/07/2017 0234   HGBUR SMALL (A) 06/07/2017 0234   BILIRUBINUR NEGATIVE 06/07/2017 0234   BILIRUBINUR negative 04/23/2017 1205   KETONESUR 5 (A) 06/07/2017 0234   PROTEINUR NEGATIVE 06/07/2017 0234   UROBILINOGEN 0.2 04/23/2017 1205   UROBILINOGEN 0.2 12/11/2010 1434   NITRITE POSITIVE (A) 06/07/2017 0234   LEUKOCYTESUR LARGE (A) 06/07/2017 0234   Sepsis Labs: Invalid input(s): PROCALCITONIN, LACTICIDVEN  Recent Results (from the past 240 hour(s))  Urine culture     Status: Abnormal   Collection Time: 06/07/17  2:34 AM  Result Value Ref Range Status   Specimen Description URINE, RANDOM  Final   Special Requests NONE  Final   Culture MULTIPLE SPECIES PRESENT, SUGGEST RECOLLECTION (A)  Final   Report Status 06/08/2017 FINAL  Final      Radiology Studies: US Abdomen Complete  Result Date: 06/07/2017 CLINICAL DATA:  Elevated LFT EXAM: ABDOMEN ULTRASOUND COMPLETE  COMPARISON:  CT 06/07/2017 FINDINGS: Gallbladder: Multiple shadowing stones, measuring up to 1.3 cm. Negative sonographic Murphy. Slightly thickened wall up to 6.9 mm. Common bile duct: Diameter: Enlarged, measuring up to 8.8 mm Liver: Slight increased echogenicity. No focal hepatic abnormality. Portal vein is patent on color Doppler imaging with normal direction of blood flow towards the liver. IVC: No abnormality visualized. Pancreas: Poorly visualized. Spleen: Size and appearance within normal limits. Right Kidney: Length: 12.1 cm. Cortical thinning. No hydronephrosis. Left Kidney: Length: 13.1 cm. Cortical thinning. No hydronephrosis. Septated cyst in the mid to lower pole measuring 6.1 x 4.2 x 4.7 cm. Abdominal aorta: Proximal aorta not seen. Mid and distal aortic segments are non dilated. Other findings: None. IMPRESSION: 1. Cholelithiasis with mild wall thickening but negative sonographic Murphy. Findings are nonspecific. Follow-up hepatobiliary nuclear medicine imaging could be performed if concern for an acute cholecystitis. 2. Slightly enlarged common bile duct up to 8.8 mm.  If laboratory values suggest obstruction, further evaluation with MRCP could be considered 3. Slightly echogenic liver consistent with fatty infiltration 4. Cortical atrophy of the kidneys. 6.1 cm septated cyst in the mid to lower left kidney. Electronically Signed   By: Donavan Foil M.D.   On: 06/07/2017 02:29   Ct Abdomen Pelvis W Contrast  Result Date: 06/07/2017 CLINICAL DATA:  Abdominal pain with nausea and vomiting for 3 days, elevated white count and lipase EXAM: CT ABDOMEN AND PELVIS WITH CONTRAST TECHNIQUE: Multidetector CT imaging of the abdomen and pelvis was performed using the standard protocol following bolus administration of intravenous contrast. CONTRAST:  80 mL Isovue 300 intravenous COMPARISON:  MRI 05/01/2017, CT 07/23/2009 FINDINGS: Lower chest: Lung bases demonstrate no acute consolidation or pleural  effusion. Mitral calcifications. Normal heart size. Mild circumferential thickening of the distal esophagus. Hepatobiliary: Multiple calcified gallstones. No focal hepatic abnormality or biliary dilatation Pancreas: Mild diffuse edema around the pancreas consistent with acute pancreatitis. No organizing fluid collections. A small cystic lesion at the inferior pancreatic head measures 17 mm and was noted on comparison MRI. Lesions in the pancreatic tail better seen on MRI. Spleen: Normal in size without focal abnormality. Adrenals/Urinary Tract: Adrenal glands are normal. Subcentimeter hypodense lesions within both kidneys. Punctate nonobstructing stone mid right kidney. 5.3 cm septated cyst in the mid to lower left kidney re- demonstrated. Bladder normal. Stomach/Bowel: The stomach is nonenlarged. No dilated small bowel. No colon wall thickening. Vascular/Lymphatic: Aortic atherosclerosis. No enlarged abdominal or pelvic lymph nodes. Reproductive: Uterus and bilateral adnexa are unremarkable. Other: Negative for free air or free fluid. Moderate fat containing periumbilical hernia with small amount of fluid and stranding in the hernia sac. Musculoskeletal: Advanced degenerative changes of the spine with marked endplate changes at P3-A2. Grade 1 anterolisthesis of L4 on L5. IMPRESSION: 1. Mild edema and diffuse hazy infiltration surrounding the pancreas, consistent with acute pancreatitis. No organizing fluid collections. Stable 17 mm cystic lesion at the inferior head of the pancreas. 2. Gallstones 3. Stable septated cyst within the mid to lower pole of the left kidney. Nonobstructing stone in the right kidney. 4. Circumferential wall thickening of the distal esophagus may reflect an esophagitis. 5. Moderate fat containing periumbilical hernia with some fluid and edema in the hernia sac. Electronically Signed   By: Donavan Foil M.D.   On: 06/07/2017 02:24   Mr 3d Recon At Scanner  Result Date:  06/07/2017 CLINICAL DATA:  Elevated white count.  Vomiting.  Elevated lipase. EXAM: MRI ABDOMEN WITHOUT AND WITH CONTRAST (INCLUDING MRCP) TECHNIQUE: Multiplanar multisequence MR imaging of the abdomen was performed both before and after the administration of intravenous contrast. Heavily T2-weighted images of the biliary and pancreatic ducts were obtained, and three-dimensional MRCP images were rendered by post processing. CONTRAST:  35mL MULTIHANCE GADOBENATE DIMEGLUMINE 529 MG/ML IV SOLN COMPARISON:  CT 06/07/2017, MRI 05/01/2017, 03/20/16 FINDINGS: Lower chest:  Lung bases are clear. Hepatobiliary: No intrahepatic duct dilatation. No focal of hepatic lesion. There multiple small gallstones within the lumen of the gallbladder (approximately 15 to 020) stones. Stones measure approximate 3 - 5 mm each. There is no dilatation of the common bile duct. No filling defect within the common bile duct. Pancreas: No the pancreas is mildly edematous within small amount of fluid along the head body and tail of the pancreas. No organized fluid collections. The pancreatic duct is not dilated. Duct is contiguous There is some motion artifact degrades the imaging but the pancreatic duct appears .  There is a lobular cystic lesion in the uncinate of the pancreas with a cluster of cysts measuring approximately 9-10 mm each in total measuring approximately 22 mm (image 64, series 40). Several smaller cysts in the neck region the gallbladder additionally. The cysts do not appear to communicate with the pancreatic duct. Cysts are similar to comparison MRI exams On the post-contrast examination is the pancreatic parenchyma appears to enhance uniformly. There is motion degradation there is motion degradation on the post-contrast T1 weighted imaging from respiratory motion Spleen: Normal Adrenals/urinary tract: Adrenal glands and kidneys are normal. Large benign appearing cyst in the LEFT renal hilum. This is described as Bosniak 64F cytic  lesion on comparison MRI (without respiratory motion). Recommend followup her prior MRI recommendation. Stomach/Bowel: Stomach and limited of the small bowel is unremarkable Vascular/Lymphatic: No vascular complication cyst hepatic bursitis for Musculoskeletal: No aggressive osseous lesion. IMPRESSION: 1. Edematous pancreas with peripancreatic fluid consistent acute pancreatitis. No evidence of pancreatic necrosis. No organized fluid collections. No variant pancreatic ductal anatomy. 2. Several small cystic lesions in the head of pancreas likely relate to chronic inflammation or indolent cystic neoplasms. No change from prior. 3. Multiple gallstones within the lumen gallbladder. No common bile duct stone or biliary obstruction. Electronically Signed   By: Suzy Bouchard M.D.   On: 06/07/2017 13:47   Mr Abdomen Mrcp Moise Boring Contast  Result Date: 06/07/2017 CLINICAL DATA:  Elevated white count.  Vomiting.  Elevated lipase. EXAM: MRI ABDOMEN WITHOUT AND WITH CONTRAST (INCLUDING MRCP) TECHNIQUE: Multiplanar multisequence MR imaging of the abdomen was performed both before and after the administration of intravenous contrast. Heavily T2-weighted images of the biliary and pancreatic ducts were obtained, and three-dimensional MRCP images were rendered by post processing. CONTRAST:  58mL MULTIHANCE GADOBENATE DIMEGLUMINE 529 MG/ML IV SOLN COMPARISON:  CT 06/07/2017, MRI 05/01/2017, 03/20/16 FINDINGS: Lower chest:  Lung bases are clear. Hepatobiliary: No intrahepatic duct dilatation. No focal of hepatic lesion. There multiple small gallstones within the lumen of the gallbladder (approximately 15 to 020) stones. Stones measure approximate 3 - 5 mm each. There is no dilatation of the common bile duct. No filling defect within the common bile duct. Pancreas: No the pancreas is mildly edematous within small amount of fluid along the head body and tail of the pancreas. No organized fluid collections. The pancreatic duct is not  dilated. Duct is contiguous There is some motion artifact degrades the imaging but the pancreatic duct appears . There is a lobular cystic lesion in the uncinate of the pancreas with a cluster of cysts measuring approximately 9-10 mm each in total measuring approximately 22 mm (image 64, series 40). Several smaller cysts in the neck region the gallbladder additionally. The cysts do not appear to communicate with the pancreatic duct. Cysts are similar to comparison MRI exams On the post-contrast examination is the pancreatic parenchyma appears to enhance uniformly. There is motion degradation there is motion degradation on the post-contrast T1 weighted imaging from respiratory motion Spleen: Normal Adrenals/urinary tract: Adrenal glands and kidneys are normal. Large benign appearing cyst in the LEFT renal hilum. This is described as Bosniak 64F cytic lesion on comparison MRI (without respiratory motion). Recommend followup her prior MRI recommendation. Stomach/Bowel: Stomach and limited of the small bowel is unremarkable Vascular/Lymphatic: No vascular complication cyst hepatic bursitis for Musculoskeletal: No aggressive osseous lesion. IMPRESSION: 1. Edematous pancreas with peripancreatic fluid consistent acute pancreatitis. No evidence of pancreatic necrosis. No organized fluid collections. No variant pancreatic ductal anatomy. 2. Several  small cystic lesions in the head of pancreas likely relate to chronic inflammation or indolent cystic neoplasms. No change from prior. 3. Multiple gallstones within the lumen gallbladder. No common bile duct stone or biliary obstruction. Electronically Signed   By: Suzy Bouchard M.D.   On: 06/07/2017 13:47    Scheduled Meds: . [MAR Hold] acidophilus   Oral Daily  . [MAR Hold] cholecalciferol  2,000 Units Oral Daily  . [MAR Hold] ferrous gluconate  324 mg Oral Q breakfast  . [MAR Hold] gabapentin  300 mg Oral QHS  . [MAR Hold] insulin aspart  0-9 Units Subcutaneous TID WC   . [MAR Hold] insulin glargine  10 Units Subcutaneous Daily  . [MAR Hold] insulin glargine  25 Units Subcutaneous QHS  . [MAR Hold] multivitamin with minerals  1 tablet Oral Daily  . [MAR Hold] nystatin cream  1 application Topical BID  . [MAR Hold] polyvinyl alcohol  1 drop Both Eyes TID  . [MAR Hold] prednisoLONE acetate  1 drop Both Eyes BID  . [MAR Hold] vitamin C   Oral Daily   Continuous Infusions: . sodium chloride 100 mL/hr at 06/08/17 0318  . [MAR Hold] piperacillin-tazobactam (ZOSYN)  IV 0 g (06/08/17 0749)    Marzetta Board, MD, PhD [facility] Pager [phone]  If 7PM-7AM, please contact night-coverage [email or web address] Password [email or web address] 06/08/2017, 2:00 PM

## 2017-06-08 NOTE — Anesthesia Preprocedure Evaluation (Addendum)
Anesthesia Evaluation  Patient identified by MRN, date of birth, ID band Patient awake    Reviewed: Allergy & Precautions, NPO status , Patient's Chart, lab work & pertinent test results  Airway Mallampati: II  TM Distance: >3 FB Neck ROM: Full    Dental  (+) Teeth Intact, Dental Advisory Given, Edentulous Upper, Poor Dentition, Chipped, Missing   Pulmonary neg pulmonary ROS,    Pulmonary exam normal breath sounds clear to auscultation       Cardiovascular hypertension, Pt. on medications +CHF and + DVT  Normal cardiovascular exam Rhythm:Regular Rate:Normal  2-D echo on 09/08/09 showed EF of 55-60 percent with grade 1 diastolic dysfunction   Neuro/Psych CVA    GI/Hepatic negative GI ROS, Gallstones    Endo/Other  diabetes, Insulin DependentMorbid obesity  Renal/GU Renal InsufficiencyRenal disease     Musculoskeletal negative musculoskeletal ROS (+)   Abdominal   Peds  Hematology  (+) Blood dyscrasia (Pradaxa; thrombocytopenia), ,   Anesthesia Other Findings Day of surgery medications reviewed with the patient.  Reproductive/Obstetrics                            Anesthesia Physical Anesthesia Plan  ASA: III  Anesthesia Plan: General   Post-op Pain Management:    Induction: Intravenous  PONV Risk Score and Plan: 3 and Ondansetron, Dexamethasone and Treatment may vary due to age or medical condition  Airway Management Planned: Oral ETT  Additional Equipment:   Intra-op Plan:   Post-operative Plan: Extubation in OR  Informed Consent: I have reviewed the patients History and Physical, chart, labs and discussed the procedure including the risks, benefits and alternatives for the proposed anesthesia with the patient or authorized representative who has indicated his/her understanding and acceptance.   Dental advisory given  Plan Discussed with: CRNA  Anesthesia Plan Comments:  (Risks/benefits of general anesthesia discussed with patient including risk of damage to teeth, lips, gum, and tongue, nausea/vomiting, allergic reactions to medications, and the possibility of heart attack, stroke and death.  All patient questions answered.  Patient wishes to proceed.)        Anesthesia Quick Evaluation

## 2017-06-08 NOTE — Transfer of Care (Signed)
Immediate Anesthesia Transfer of Care Note  Patient: Julie Mora  Procedure(s) Performed: Procedure(s): LAPAROSCOPIC CHOLECYSTECTOMY (N/A)  Patient Location: PACU  Anesthesia Type:General  Level of Consciousness: sedated  Airway & Oxygen Therapy: Patient Spontanous Breathing and Patient connected to face mask oxygen  Post-op Assessment: Report given to RN and Post -op Vital signs reviewed and stable  Post vital signs: Reviewed and stable  Last Vitals:  Vitals:   06/08/17 0445 06/08/17 1230  BP: 121/65 117/71  Pulse: 100 (!) 106  Resp: 20 (!) (P) 36  Temp: 37.2 C 36.9 C  SpO2: 97% 90%    Last Pain:  Vitals:   06/08/17 0445  TempSrc: Oral  PainSc:          Complications: No apparent anesthesia complications

## 2017-06-08 NOTE — Discharge Instructions (Signed)

## 2017-06-08 NOTE — Progress Notes (Signed)
ANTICOAGULATION CONSULT NOTE - f/u Consult  Pharmacy Consult for IV heparin Indication: pulmonary embolus  Allergies  Allergen Reactions  . Azathioprine Shortness Of Breath  . Cellcept [Mycophenolate Mofetil] Nausea Only    severe nausea  . Ramipril Cough    Patient Measurements: Height: 5\' 4"  (162.6 cm) Weight: 247 lb 9.2 oz (112.3 kg) IBW/kg (Calculated) : 54.7 Heparin Dosing Weight: 72 kg  Vital Signs: Temp: 98.9 F (37.2 C) (09/21 1317) Temp Source: Oral (09/21 0445) BP: 115/69 (09/21 1317) Pulse Rate: 91 (09/21 1317)  Labs:  Recent Labs  06/06/17 2320 06/07/17 0552 06/08/17 0435  HGB 15.6* 14.5 13.6  HCT 46.2* 43.7 41.5  PLT 171 170 143*  APTT  --  32  --   LABPROT 14.6  --   --   INR 1.14  --   --   CREATININE 0.78 0.89 0.90    Estimated Creatinine Clearance: 66.2 mL/min (by C-G formula based on SCr of 0.9 mg/dL).   Medical History: Past Medical History:  Diagnosis Date  . Anemia   . Essential hypertension, benign   . Incontinence   . Other and unspecified hyperlipidemia   . Pemphigus foliaceous   . Renal insufficiency   . Type II or unspecified type diabetes mellitus without mention of complication, not stated as uncontrolled   . Vitamin D deficiency     Medications:  Scheduled:  . acidophilus   Oral Daily  . cholecalciferol  2,000 Units Oral Daily  . ferrous gluconate  324 mg Oral Q breakfast  . gabapentin  300 mg Oral QHS  . insulin aspart  0-9 Units Subcutaneous TID WC  . insulin glargine  10 Units Subcutaneous Daily  . insulin glargine  25 Units Subcutaneous QHS  . multivitamin with minerals  1 tablet Oral Daily  . nystatin cream  1 application Topical BID  . polyvinyl alcohol  1 drop Both Eyes TID  . prednisoLONE acetate  1 drop Both Eyes BID  . vitamin C   Oral Daily   Infusions:  . sodium chloride 100 mL/hr at 06/08/17 1456  . piperacillin-tazobactam (ZOSYN)  IV 0 g (06/08/17 0749)  . potassium chloride 10 mEq (06/08/17 1457)     Assessment: 37 yoF admitted with nausea and vomiting on chronic Pradaxa for hx of PE.  IV heparin per Rx while pradaxa on hold.  S/p LAPAROSCOPIC CHOLECYSTECTOMY on 9/21 at 1235.  No bleeding or complications reported.  Per Dr. Kieth Brightly care order, resume heparin on 9/21 at 1600.  Goal of Therapy:  Heparin level 0.3-0.7 units/ml Monitor platelets by anticoagulation protocol: Yes   Plan:   Re-start heparin IV infusion at 1100 units/hr  (no bolus d/t recent surgery)  Heparin level 8 hours after starting  Daily heparin level and CBC  Continue to monitor H&H and platelets   Gretta Arab PharmD, BCPS Pager [phone] 06/08/2017 3:53 PM

## 2017-06-08 NOTE — Op Note (Signed)
PATIENT:  Julie Mora  76 y.o. female  PRE-OPERATIVE DIAGNOSIS:  Gallstone pancreatitis  POST-OPERATIVE DIAGNOSIS:  Gallstone pancreatitis  PROCEDURE:  Procedure(s): LAPAROSCOPIC CHOLECYSTECTOMY   SURGEON:  Surgeon(s): Kecia Swoboda, Arta Bruce, MD  ASSISTANT: none  ANESTHESIA:   local and general  Indications for procedure: [name] is a 76 y.o. female with symptoms of Abdominal pain and Nausea and vomiting consistent with gallbladder disease, Confirmed by Ultrasound and CT.  Description of procedure: The patient was brought into the operative suite, placed supine. Anesthesia was administered with endotracheal tube. Patient was strapped in place and foot board was secured. All pressure points were offloaded by foam padding. The patient was prepped and draped in the usual sterile fashion.  A small incision was made to the right of the umbilicus. A 86mm trocar was inserted into the peritoneal cavity with optical entry. Pneumoperitoneum was applied with high flow low pressure. 2 44mm trocars were placed in the RUQ. A 89mm trocar was placed in the subxiphoid space. All trocars sites were first anesthesized with 0.25% marcaine with epinephrine in the subcutaneous and preperitoneal layers. Next the patient was placed in reverse trendelenberg. The gallbladder was white with multiple adhesions to the omentum, these were taken down with blunt dissection.  The gallbladder was retracted cephalad and lateral. The peritoneum was reflected off the infundibulum working lateral to medial. The cystic duct and cystic artery were identified and further dissection revealed a critical view. The cystic duct and cystic artery were doubly clipped and ligated.   The gallbladder was removed off the liver bed with cautery. The Gallbladder was placed in a specimen bag. The gallbladder fossa was irrigated and hemostasis was applied with cautery. The gallbladder was removed via the 64mm trocar. The fascial defect was  closed with interrupted 0 vicryl suture via laparoscopic trans-fascial suture passer. Pneumoperitoneum was removed, all trocar were removed. All incisions were closed with 4-0 monocryl subcuticular stitch. The patient woke from anesthesia and was brought to PACU in stable condition. All counts were correct  Findings: inflamed gallbladder  Specimen: gallbladder  Blood loss: Total I/O In: 1000 [I.V.:1000] Out: -  ml  Local anesthesia: 10 ml 5.3% marcaine  Complications: none  PLAN OF CARE: Admit to inpatient   PATIENT DISPOSITION:  PACU - hemodynamically stable.  Gurney Maxin, M.D. General, Bariatric, & Minimally Invasive Surgery [facility]  [

## 2017-06-08 NOTE — Progress Notes (Signed)
PT Cancellation Note  Patient Details Name: Julie Mora MRN: [id number] DOB: Sep 27, 1940   Cancelled Treatment:    Reason Eval/Treat Not Completed: Patient at procedure or test/unavailable (Will follow. )   Philomena Doheny 06/08/2017, 10:52 AM [phone]

## 2017-06-08 NOTE — Anesthesia Procedure Notes (Signed)
Date/Time: 06/08/2017 12:24 PM Performed by: Cynda Familia Oxygen Delivery Method: Simple face mask Placement Confirmation: positive ETCO2 and breath sounds checked- equal and bilateral Dental Injury: Teeth and Oropharynx as per pre-operative assessment  Comments: Extubated to simple face mask--- good aw to pacu o2 intact

## 2017-06-08 NOTE — Anesthesia Procedure Notes (Signed)
Procedure Name: Intubation Date/Time: 06/08/2017 11:09 AM Performed by: Cynda Familia Pre-anesthesia Checklist: Patient identified, Emergency Drugs available, Suction available and Patient being monitored Patient Re-evaluated:Patient Re-evaluated prior to induction Oxygen Delivery Method: Circle System Utilized Preoxygenation: Pre-oxygenation with 100% oxygen Induction Type: IV induction Ventilation: Mask ventilation without difficulty Laryngoscope Size: Miller and 2 Grade View: Grade I Tube type: Oral Tube size: 7.0 mm Number of attempts: 1 Airway Equipment and Method: Stylet Placement Confirmation: ETT inserted through vocal cords under direct vision,  positive ETCO2 and breath sounds checked- equal and bilateral Secured at: 21 cm Tube secured with: Tape Dental Injury: Teeth and Oropharynx as per pre-operative assessment  Comments: Smooth IV induction Turk--- intubation AM CRNA atraumatic-- teeth and mouth as preop--- no teeth upper--- many missing teeth lower-- lower front tooth broken off-- chipped teeth --- unchanged after laryngoscopy--- bilat BS Gifford Shave

## 2017-06-08 NOTE — Anesthesia Procedure Notes (Signed)
Performed by: Zadrian Mccauley M       

## 2017-06-09 LAB — GLUCOSE, CAPILLARY
Glucose-Capillary: 168 mg/dL — ABNORMAL HIGH (ref 65–99)
Glucose-Capillary: 218 mg/dL — ABNORMAL HIGH (ref 65–99)
Glucose-Capillary: 222 mg/dL — ABNORMAL HIGH (ref 65–99)
Glucose-Capillary: 168 mg/dL — ABNORMAL HIGH (ref 65–99)

## 2017-06-09 LAB — HEPARIN LEVEL (UNFRACTIONATED)
HEPARIN UNFRACTIONATED: 0.26 [IU]/mL — AB (ref 0.30–0.70)
HEPARIN UNFRACTIONATED: 0.25 [IU]/mL — AB (ref 0.30–0.70)
HEPARIN UNFRACTIONATED: 0.14 [IU]/mL — AB (ref 0.30–0.70)

## 2017-06-09 LAB — COMPREHENSIVE METABOLIC PANEL
ALBUMIN: 2.2 g/dL — AB (ref 3.5–5.0)
ALT: 151 U/L — ABNORMAL HIGH (ref 14–54)
ANION GAP: 6 (ref 5–15)
AST: 82 U/L — ABNORMAL HIGH (ref 15–41)
Alkaline Phosphatase: 100 U/L (ref 38–126)
BILIRUBIN TOTAL: 0.7 mg/dL (ref 0.3–1.2)
BUN: 19 mg/dL (ref 6–20)
CO2: 22 mmol/L (ref 22–32)
Calcium: 7.3 mg/dL — ABNORMAL LOW (ref 8.9–10.3)
Chloride: 110 mmol/L (ref 101–111)
Creatinine, Ser: 0.81 mg/dL (ref 0.44–1.00)
GFR calc Af Amer: >60 mL/min (ref >60)
Glucose, Bld: 221 mg/dL — ABNORMAL HIGH (ref 65–99)
POTASSIUM: 3.9 mmol/L (ref 3.5–5.1)
Sodium: 138 mmol/L (ref 135–145)
TOTAL PROTEIN: 5.3 g/dL — AB (ref 6.5–8.1)

## 2017-06-09 LAB — CBC
HEMATOCRIT: 37.5 % (ref 36.0–46.0)
Hemoglobin: 12.8 g/dL (ref 12.0–15.0)
MCH: 32.7 pg (ref 26.0–34.0)
MCHC: 34.1 g/dL (ref 30.0–36.0)
MCV: 95.9 fL (ref 78.0–100.0)
PLATELETS: 135 10*3/uL — AB (ref 150–400)
RBC: 3.91 MIL/uL (ref 3.87–5.11)
RDW: 12.9 % (ref 11.5–15.5)
WBC: 12.3 10*3/uL — ABNORMAL HIGH (ref 4.0–10.5)

## 2017-06-09 MED ORDER — CEFTRIAXONE SODIUM 2 G IJ SOLR
2.0000 g | INTRAMUSCULAR | Status: DC
  Administered 2017-06-09 – 2017-06-10 (×2): 2 g via INTRAVENOUS
  Filled 2017-06-09 (×4): qty 2

## 2017-06-09 MED ORDER — HEPARIN (PORCINE) IN NACL 100-0.45 UNIT/ML-% IJ SOLN: 1300.0000 [IU]/h | INTRAMUSCULAR | Status: DC

## 2017-06-09 MED ORDER — TORSEMIDE 20 MG PO TABS
20.0000 mg | ORAL_TABLET | Freq: Every day | ORAL | Status: DC
  Administered 2017-06-10: 20 mg via ORAL
  Filled 2017-06-09 (×2): qty 1

## 2017-06-09 MED ORDER — HEPARIN (PORCINE) IN NACL 100-0.45 UNIT/ML-% IJ SOLN
1700.0000 [IU]/h | INTRAMUSCULAR | Status: AC
  Administered 2017-06-09: 1450 [IU]/h via INTRAVENOUS
  Administered 2017-06-10 – 2017-06-11 (×3): 1700 [IU]/h via INTRAVENOUS
  Filled 2017-06-09 (×4): qty 250

## 2017-06-09 MED ORDER — FUROSEMIDE 10 MG/ML IJ SOLN
40.0000 mg | Freq: Once | INTRAMUSCULAR | Status: AC
  Administered 2017-06-09: 40 mg via INTRAVENOUS
  Filled 2017-06-09: qty 4

## 2017-06-09 MED ORDER — ACETAMINOPHEN 325 MG PO TABS
325.0000 mg | ORAL_TABLET | Freq: Four times a day (QID) | ORAL | Status: DC | PRN
  Administered 2017-06-09 – 2017-06-10 (×2): 325 mg via ORAL
  Filled 2017-06-09 (×3): qty 1

## 2017-06-09 NOTE — Evaluation (Signed)
Physical Therapy Evaluation Patient Details Name: Julie Mora MRN: [id number] DOB: Oct 08, 1940 Today's Date: 06/09/2017   History of Present Illness  76 y.o. female with medical history significant of hypertension, hyperlipidemia, diabetes mellitus, stroke, DVT on Pradaxa, CHF, who presents with nausea, vomiting, abdominal pain and increased urinary frequency. Dx of gallstone pancreatitis, UTI.  Pt s/p MRCP 9/20 and s/p laparoscopic cholecystectomy on 9/21  Clinical Impression  Pt admitted with above diagnosis. Pt currently with functional limitations due to the deficits listed below (see PT Problem List).  Pt will benefit from skilled PT to increase their independence and safety with mobility to allow discharge to the venue listed below.  Pt ambulated short distance in hallway.  Pt reports she plans to d/c home and agreeable to HHPT, declines SNF at this time.     Follow Up Recommendations Home health PT;Supervision - Intermittent    Equipment Recommendations  None recommended by PT    Recommendations for Other Services       Precautions / Restrictions Precautions Precautions: Fall      Mobility  Bed Mobility Overal bed mobility: Needs Assistance Bed Mobility: Supine to Sit     Supine to sit: Min guard;HOB elevated     General bed mobility comments: pt utilized bed rail and HHA to self assist upright  Transfers Overall transfer level: Needs assistance Equipment used: Rolling walker (2 wheeled) Transfers: Sit to/from Stand Sit to Stand: Min guard         General transfer comment: verbal cues for safe technique  Ambulation/Gait Ambulation/Gait assistance: Min guard Ambulation Distance (Feet): 60 Feet Assistive device: Rolling walker (2 wheeled) Gait Pattern/deviations: Step-through pattern;Decreased stride length     General Gait Details: verbal cues for RW positioning, pt typically uses rollator, short standing rest break after 30 feet  Stairs             Wheelchair Mobility    Modified Rankin (Stroke Patients Only)       Balance                                             Pertinent Vitals/Pain Pain Assessment: 0-10 Pain Score: 5  Pain Location: surgical site Pain Descriptors / Indicators: Aching;Sore Pain Intervention(s): Monitored during session;Limited activity within patient's tolerance;Repositioned    Home Living Family/patient expects to be discharged to:: Private residence Living Arrangements: Alone   Type of Home: House Home Access: Ramped entrance     Home Layout: One level Home Equipment: Toilet riser;Walker - 4 wheels      Prior Function Level of Independence: Needs assistance   Gait / Transfers Assistance Needed: has a housekeeper 3x/ week           Hand Dominance        Extremity/Trunk Assessment        Lower Extremity Assessment Lower Extremity Assessment: Generalized weakness       Communication   Communication: No difficulties  Cognition Arousal/Alertness: Awake/alert Behavior During Therapy: WFL for tasks assessed/performed Overall Cognitive Status: Within Functional Limits for tasks assessed                                        General Comments      Exercises     Assessment/Plan    PT  Assessment Patient needs continued PT services  PT Problem List Decreased strength;Decreased mobility;Decreased activity tolerance       PT Treatment Interventions DME instruction;Gait training;Therapeutic exercise;Therapeutic activities;Functional mobility training;Patient/family education    PT Goals (Current goals can be found in the Care Plan section)  Acute Rehab PT Goals PT Goal Formulation: With patient Time For Goal Achievement: 06/23/17 Potential to Achieve Goals: Good    Frequency Min 3X/week   Barriers to discharge        Co-evaluation               AM-PAC PT "6 Clicks" Daily Activity  Outcome Measure Difficulty turning  over in bed (including adjusting bedclothes, sheets and blankets)?: A Little Difficulty moving from lying on back to sitting on the side of the bed? : Unable Difficulty sitting down on and standing up from a chair with arms (e.g., wheelchair, bedside commode, etc,.)?: Unable Help needed moving to and from a bed to chair (including a wheelchair)?: A Little Help needed walking in hospital room?: A Little Help needed climbing 3-5 steps with a railing? : A Lot 6 Click Score: 13    End of Session   Activity Tolerance: Patient limited by fatigue Patient left: in chair;with call bell/phone within reach;with family/visitor present;with nursing/sitter in room Nurse Communication: Mobility status PT Visit Diagnosis: Difficulty in walking, not elsewhere classified (R26.2)    Time: 4709-6283 PT Time Calculation (min) (ACUTE ONLY): 23 min   Charges:   PT Evaluation $PT Eval Low Complexity: 1 Low     PT G Codes:       Carmelia Bake, PT, DPT 06/09/2017 Pager: [phone]  York Ram E 06/09/2017, 4:37 PM

## 2017-06-09 NOTE — Progress Notes (Signed)
[facility] Office:  [phone] General Surgery Progress Note   LOS: 2 days  POD -  1 Day Post-Op  Chief Complaint: Abdominal pain  Assessment and Plan: 1.  LAPAROSCOPIC CHOLECYSTECTOMY - 06/09/2017 - [facility]  WBC- 12,300 - 06/09/2017  Zosyn  Doing okay - needs to move more and work on IS.  2.  Anticoagulated on Pradaxa - bridged with heparin  For history of DVT/stroke  Can restart Pradaxa tomorrow night or Monday, if otherwise doing well 3.  Obese 4.  DVT prophylaxis - on IV heparin   Principal Problem:   Gallstone pancreatitis Active Problems:   HYPERCHOLESTEROLEMIA   Essential hypertension   History of deep venous thrombosis   Sepsis (HCC)   Pancreatic cyst   Hyperkalemia   Abnormal LFTs   UTI (urinary tract infection)   Choledocholithiasis   Chronic diastolic CHF (congestive heart failure) (HCC)   Subjective:  Not moving much.  Is using a female condom for urine (this usually just makes patients bed bound)   Objective:   Vitals:   06/08/17 1950 06/09/17 0533  BP: 112/69 111/65  Pulse: 81 75  Resp: (!) 22 (!) 21  Temp: 99 F (37.2 C) 98.5 F (36.9 C)  SpO2: 95% 94%     Intake/Output from previous day:  09/21 0701 - 09/22 0700 In: 3746.6 [P.O.:120; I.V.:3476.5; IV Piggyback:150.1] Out: 520 [Urine:500; Blood:20]  Intake/Output this shift:  No intake/output data recorded.   Physical Exam:   General: Obese WF who is alert and oriented.    HEENT: Normal. Pupils equal. .   Lungs: IS - 700 cc   Abdomen: Obese.  Complains of soreness in the RUQ   Wound: Clear   Lab Results:    Recent Labs  06/08/17 0435 06/09/17 0523  WBC 18.5* 12.3*  HGB 13.6 12.8  HCT 41.5 37.5  PLT 143* 135*    BMET   Recent Labs  06/08/17 0435 06/09/17 0523  NA 138 138  K 3.1* 3.9  CL 107 110  CO2 23 22  GLUCOSE 187* 221*  BUN 20 19  CREATININE 0.90 0.81  CALCIUM 7.9* 7.3*    PT/INR   Recent Labs  06/06/17 2320  LABPROT 14.6  INR 1.14     ABG  No results for input(s): PHART, HCO3 in the last 72 hours.  Invalid input(s): PCO2, PO2   Studies/Results:  Mr 3d Recon At Scanner  Result Date: 06/07/2017 CLINICAL DATA:  Elevated white count.  Vomiting.  Elevated lipase. EXAM: MRI ABDOMEN WITHOUT AND WITH CONTRAST (INCLUDING MRCP) TECHNIQUE: Multiplanar multisequence MR imaging of the abdomen was performed both before and after the administration of intravenous contrast. Heavily T2-weighted images of the biliary and pancreatic ducts were obtained, and three-dimensional MRCP images were rendered by post processing. CONTRAST:  37mL MULTIHANCE GADOBENATE DIMEGLUMINE 529 MG/ML IV SOLN COMPARISON:  CT 06/07/2017, MRI 05/01/2017, 03/20/16 FINDINGS: Lower chest:  Lung bases are clear. Hepatobiliary: No intrahepatic duct dilatation. No focal of hepatic lesion. There multiple small gallstones within the lumen of the gallbladder (approximately 15 to 020) stones. Stones measure approximate 3 - 5 mm each. There is no dilatation of the common bile duct. No filling defect within the common bile duct. Pancreas: No the pancreas is mildly edematous within small amount of fluid along the head body and tail of the pancreas. No organized fluid collections. The pancreatic duct is not dilated. Duct is contiguous There is some motion artifact degrades the imaging but the pancreatic duct appears .  There is a lobular cystic lesion in the uncinate of the pancreas with a cluster of cysts measuring approximately 9-10 mm each in total measuring approximately 22 mm (image 64, series 40). Several smaller cysts in the neck region the gallbladder additionally. The cysts do not appear to communicate with the pancreatic duct. Cysts are similar to comparison MRI exams On the post-contrast examination is the pancreatic parenchyma appears to enhance uniformly. There is motion degradation there is motion degradation on the post-contrast T1 weighted imaging from respiratory motion  Spleen: Normal Adrenals/urinary tract: Adrenal glands and kidneys are normal. Large benign appearing cyst in the LEFT renal hilum. This is described as Bosniak 91F cytic lesion on comparison MRI (without respiratory motion). Recommend followup her prior MRI recommendation. Stomach/Bowel: Stomach and limited of the small bowel is unremarkable Vascular/Lymphatic: No vascular complication cyst hepatic bursitis for Musculoskeletal: No aggressive osseous lesion. IMPRESSION: 1. Edematous pancreas with peripancreatic fluid consistent acute pancreatitis. No evidence of pancreatic necrosis. No organized fluid collections. No variant pancreatic ductal anatomy. 2. Several small cystic lesions in the head of pancreas likely relate to chronic inflammation or indolent cystic neoplasms. No change from prior. 3. Multiple gallstones within the lumen gallbladder. No common bile duct stone or biliary obstruction. Electronically Signed   By: Suzy Bouchard M.D.   On: 06/07/2017 13:47   Mr Abdomen Mrcp Moise Boring Contast  Result Date: 06/07/2017 CLINICAL DATA:  Elevated white count.  Vomiting.  Elevated lipase. EXAM: MRI ABDOMEN WITHOUT AND WITH CONTRAST (INCLUDING MRCP) TECHNIQUE: Multiplanar multisequence MR imaging of the abdomen was performed both before and after the administration of intravenous contrast. Heavily T2-weighted images of the biliary and pancreatic ducts were obtained, and three-dimensional MRCP images were rendered by post processing. CONTRAST:  14mL MULTIHANCE GADOBENATE DIMEGLUMINE 529 MG/ML IV SOLN COMPARISON:  CT 06/07/2017, MRI 05/01/2017, 03/20/16 FINDINGS: Lower chest:  Lung bases are clear. Hepatobiliary: No intrahepatic duct dilatation. No focal of hepatic lesion. There multiple small gallstones within the lumen of the gallbladder (approximately 15 to 020) stones. Stones measure approximate 3 - 5 mm each. There is no dilatation of the common bile duct. No filling defect within the common bile duct. Pancreas: No  the pancreas is mildly edematous within small amount of fluid along the head body and tail of the pancreas. No organized fluid collections. The pancreatic duct is not dilated. Duct is contiguous There is some motion artifact degrades the imaging but the pancreatic duct appears . There is a lobular cystic lesion in the uncinate of the pancreas with a cluster of cysts measuring approximately 9-10 mm each in total measuring approximately 22 mm (image 64, series 40). Several smaller cysts in the neck region the gallbladder additionally. The cysts do not appear to communicate with the pancreatic duct. Cysts are similar to comparison MRI exams On the post-contrast examination is the pancreatic parenchyma appears to enhance uniformly. There is motion degradation there is motion degradation on the post-contrast T1 weighted imaging from respiratory motion Spleen: Normal Adrenals/urinary tract: Adrenal glands and kidneys are normal. Large benign appearing cyst in the LEFT renal hilum. This is described as Bosniak 91F cytic lesion on comparison MRI (without respiratory motion). Recommend followup her prior MRI recommendation. Stomach/Bowel: Stomach and limited of the small bowel is unremarkable Vascular/Lymphatic: No vascular complication cyst hepatic bursitis for Musculoskeletal: No aggressive osseous lesion. IMPRESSION: 1. Edematous pancreas with peripancreatic fluid consistent acute pancreatitis. No evidence of pancreatic necrosis. No organized fluid collections. No variant pancreatic ductal anatomy. 2. Several  small cystic lesions in the head of pancreas likely relate to chronic inflammation or indolent cystic neoplasms. No change from prior. 3. Multiple gallstones within the lumen gallbladder. No common bile duct stone or biliary obstruction. Electronically Signed   By: Suzy Bouchard M.D.   On: 06/07/2017 13:47     Anti-infectives:   Anti-infectives    Start     Dose/Rate Route Frequency Ordered Stop   06/08/17  1048  piperacillin-tazobactam (ZOSYN) 3.375 GM/50ML IVPB    Comments:  Bridget Hartshorn   : cabinet override      06/08/17 1048 06/08/17 1049   06/07/17 1200  piperacillin-tazobactam (ZOSYN) IVPB 3.375 g     3.375 g 12.5 mL/hr over 240 Minutes Intravenous Every 8 hours 06/07/17 0421     06/07/17 0345  piperacillin-tazobactam (ZOSYN) IVPB 3.375 g     3.375 g 100 mL/hr over 30 Minutes Intravenous NOW 06/07/17 0335 06/07/17 0408   06/07/17 0330  cefTRIAXone (ROCEPHIN) 1 g in dextrose 5 % 50 mL IVPB  Status:  Discontinued     1 g 100 mL/hr over 30 Minutes Intravenous  Once 06/07/17 0326 06/07/17 0333      Alphonsa Overall, MD, FACS Pager: [facility] Office: [phone] 06/09/2017

## 2017-06-09 NOTE — Progress Notes (Signed)
ANTICOAGULATION CONSULT NOTE - f/u Consult  Pharmacy Consult for IV heparin Indication: pulmonary embolus  Allergies  Allergen Reactions  . Azathioprine Shortness Of Breath  . Cellcept [Mycophenolate Mofetil] Nausea Only    severe nausea  . Ramipril Cough    Patient Measurements: Height: 5\' 4"  (162.6 cm) Weight: 247 lb 9.2 oz (112.3 kg) IBW/kg (Calculated) : 54.7 Heparin Dosing Weight: 72 kg  Vital Signs: Temp: 99 F (37.2 C) (09/21 1950) Temp Source: Oral (09/21 1950) BP: 112/69 (09/21 1950) Pulse Rate: 81 (09/21 1950)  Labs:  Recent Labs  06/06/17 2320 06/07/17 0552 06/08/17 0435 06/09/17 0046  HGB 15.6* 14.5 13.6  --   HCT 46.2* 43.7 41.5  --   PLT 171 170 143*  --   APTT  --  32  --   --   LABPROT 14.6  --   --   --   INR 1.14  --   --   --   HEPARINUNFRC  --   --   --  0.14*  CREATININE 0.78 0.89 0.90  --     Estimated Creatinine Clearance: 66.2 mL/min (by C-G formula based on SCr of 0.9 mg/dL).   Medical History: Past Medical History:  Diagnosis Date  . Anemia   . Essential hypertension, benign   . Incontinence   . Other and unspecified hyperlipidemia   . Pemphigus foliaceous   . Renal insufficiency   . Type II or unspecified type diabetes mellitus without mention of complication, not stated as uncontrolled   . Vitamin D deficiency     Medications:  Scheduled:  . acidophilus   Oral Daily  . cholecalciferol  2,000 Units Oral Daily  . ferrous gluconate  324 mg Oral Q breakfast  . gabapentin  300 mg Oral QHS  . insulin aspart  0-9 Units Subcutaneous TID WC  . insulin glargine  10 Units Subcutaneous Daily  . insulin glargine  25 Units Subcutaneous QHS  . multivitamin with minerals  1 tablet Oral Daily  . nystatin cream  1 application Topical BID  . polyvinyl alcohol  1 drop Both Eyes TID  . prednisoLONE acetate  1 drop Both Eyes BID  . vitamin C   Oral Daily   Infusions:  . sodium chloride 100 mL/hr at 06/08/17 1456  . heparin    .  piperacillin-tazobactam (ZOSYN)  IV Stopped (06/09/17 0052)    Assessment: 15 yoF admitted with nausea and vomiting on chronic Pradaxa for hx of PE.  IV heparin per Rx while pradaxa on hold.  S/p LAPAROSCOPIC CHOLECYSTECTOMY on 9/21 at 1235.  No bleeding or complications reported.  Per Dr. Kieth Brightly care order, resume heparin on 9/21 at 1600. Today, 9/22 0046 HL=0.14 below goal, no infusion or bleeding issues per RN Goal of Therapy:  Heparin level 0.3-0.7 units/ml Monitor platelets by anticoagulation protocol: Yes   Plan:   Increase heparin drip to 1300 units/hr  (no bolus d/t recent surgery)  Recheck heparin level 8 hours after increasing  Daily heparin level and CBC  Continue to monitor H&H and platelets    Dorrene German 06/09/2017 1:32 AM

## 2017-06-09 NOTE — Progress Notes (Signed)
PROGRESS NOTE  Julie Mora XVQ:[id number] DOB: Jul 16, 1941 DOA: 06/06/2017 PCP: Elby Showers, MD   LOS: 2 days   Brief Narrative / Interim history: pleasant 75 year old female with history of hypertension, hyperlipidemia, diabetes, prior CVA, DVT on chronic anticoagulation who presented to the emergency room with abdominal pain, nausea vomiting.  She was found to have acute pancreatitis as well as cholelithiasis with a slightly dilated CBD.  Assessment & Plan: Principal Problem:   Gallstone pancreatitis Active Problems:   HYPERCHOLESTEROLEMIA   Essential hypertension   History of deep venous thrombosis   Sepsis (HCC)   Pancreatic cyst   Hyperkalemia   Abnormal LFTs   UTI (urinary tract infection)   Choledocholithiasis   Chronic diastolic CHF (congestive heart failure) (HCC)   Gallstone pancreatitis -Supportive treatment, IV fluids, pain control. SIRS likely due to pancreatitis rather than infection. -Improving, she underwent an MRCP on 9/20 which was negative for stone in CBD -Patient is status post laparoscopic cholecystectomy on 9/21, recovering well postop, has some pain which appears to be controlled with Tylenol alone -Liver enzymes are improving, bilirubin has now normalized.  UTI -patient placed on Zosyn on admission, continue, will cover for GI source as well, urine cultures with multiple species, final -Narrow to ceftriaxone, discontinued in 1-2 days if all cultures remain negative  Hypertension -Hydralazine as needed, hold torsemide  History of DVT -Resume anticoagulation when ok with general surgery, appears to be okay either Sunday evening or Monday morning  Chronic diastolic CHF -Stable, she looks a little bit more fluid overloaded this morning, discontinue IV fluids, resume torsemide from tomorrow, give IV Lasix 1 today   DVT prophylaxis: Per general surgery Code Status: Full code Family Communication: No family at bedside Disposition Plan: Home  when ready stop PT evaluation pending, however patient tells me that she will not go to an SNF  Consultants:   Gastroenterology  General surgery  Procedures:   none  Antimicrobials:  Zosyn 9/20 >> 9/22  Ceftriaxone 9/22 >>  Subjective: -Complains of abdominal soreness especially on the right side, no nausea or vomiting, no chest pain, no shortness of breath  Objective: Vitals:   06/08/17 1300 06/08/17 1317 06/08/17 1950 06/09/17 0533  BP: 115/70 115/69 112/69 111/65  Pulse: 94 91 81 75  Resp: 17 (!) 26 (!) 22 (!) 21  Temp: 98.8 F (37.1 C) 98.9 F (37.2 C) 99 F (37.2 C) 98.5 F (36.9 C)  TempSrc:   Oral Oral  SpO2: 93% 94% 95% 94%  Weight:      Height:        Intake/Output Summary (Last 24 hours) at 06/09/17 1346 Last data filed at 06/09/17 0638  Gross per 24 hour  Intake          2346.58 ml  Output              500 ml  Net          18 46.58 ml   Filed Weights   06/07/17 0434  Weight: 112.3 kg (247 lb 9.2 oz)    Examination:  Constitutional: No distress Eyes: No scleral icterus Respiratory: Clear to auscultation, no wheezing, no crackles Cardiovascular: Regular rate and rhythm without murmurs. 2+ lower extremity edema.  Abdomen: Surgical scars clean, no drainage, slight abdominal tenderness on palpation Skin: Venous stasis changes bilateral lower extremities Neurologic: Nonfocal, moves all 4  Data Reviewed: I have independently reviewed following labs and imaging studies   CBC:  Recent Labs Lab 06/06/17 2320 06/07/17 0932  06/08/17 0435 06/09/17 0523  WBC 15.5* 16.6* 18.5* 12.3*  HGB 15.6* 14.5 13.6 12.8  HCT 46.2* 43.7 41.5 37.5  MCV 97.1 98.0 96.7 95.9  PLT 171 170 143* 382*   Basic Metabolic Panel:  Recent Labs Lab 06/06/17 2320 06/07/17 0552 06/08/17 0435 06/09/17 0523  NA 137 141 138 138  K 5.2* 4.2 3.1* 3.9  CL 104 107 107 110  CO2 21* 24 23 22   GLUCOSE 330* 279* 187* 221*  BUN 25* 24* 20 19  CREATININE 0.78 0.89 0.90 0.81   CALCIUM 9.2 8.6* 7.9* 7.3*   GFR: Estimated Creatinine Clearance: 73.6 mL/min (by C-G formula based on SCr of 0.81 mg/dL). Liver Function Tests:  Recent Labs Lab 06/06/17 2320 06/07/17 0552 06/08/17 0435 06/09/17 0523  AST 355* 221* 73* 82*  ALT 438* 363* 216* 151*  ALKPHOS 153* 146* 121 100  BILITOT 4.6* 2.4* 1.1 0.7  PROT 7.0 6.5 5.8* 5.3*  ALBUMIN 3.4* 3.1* 2.6* 2.2*    Recent Labs Lab 06/06/17 2320 06/08/17 0435  LIPASE 343* 27   No results for input(s): AMMONIA in the last 168 hours. Coagulation Profile:  Recent Labs Lab 06/06/17 2320  INR 1.14   Cardiac Enzymes: No results for input(s): CKTOTAL, CKMB, CKMBINDEX, TROPONINI in the last 168 hours. BNP (last 3 results) No results for input(s): PROBNP in the last 8760 hours. HbA1C: No results for input(s): HGBA1C in the last 72 hours. CBG:  Recent Labs Lab 06/08/17 1233 06/08/17 1718 06/08/17 2047 06/09/17 0835 06/09/17 1254  GLUCAP 127* 258* 290* 168* 218*   Lipid Profile: No results for input(s): CHOL, HDL, LDLCALC, TRIG, CHOLHDL, LDLDIRECT in the last 72 hours. Thyroid Function Tests: No results for input(s): TSH, T4TOTAL, FREET4, T3FREE, THYROIDAB in the last 72 hours. Anemia Panel: No results for input(s): VITAMINB12, FOLATE, FERRITIN, TIBC, IRON, RETICCTPCT in the last 72 hours. Urine analysis:    Component Value Date/Time   COLORURINE YELLOW 06/07/2017 0234   APPEARANCEUR HAZY (A) 06/07/2017 0234   LABSPEC 1.021 06/07/2017 0234   PHURINE 6.0 06/07/2017 0234   GLUCOSEU >=500 (A) 06/07/2017 0234   HGBUR SMALL (A) 06/07/2017 0234   BILIRUBINUR NEGATIVE 06/07/2017 0234   BILIRUBINUR negative 04/23/2017 1205   KETONESUR 5 (A) 06/07/2017 0234   PROTEINUR NEGATIVE 06/07/2017 0234   UROBILINOGEN 0.2 04/23/2017 1205   UROBILINOGEN 0.2 12/11/2010 1434   NITRITE POSITIVE (A) 06/07/2017 0234   LEUKOCYTESUR LARGE (A) 06/07/2017 0234   Sepsis Labs: Invalid input(s): PROCALCITONIN,  LACTICIDVEN  Recent Results (from the past 240 hour(s))  Urine culture     Status: Abnormal   Collection Time: 06/07/17  2:34 AM  Result Value Ref Range Status   Specimen Description URINE, RANDOM  Final   Special Requests NONE  Final   Culture MULTIPLE SPECIES PRESENT, SUGGEST RECOLLECTION (A)  Final   Report Status 06/08/2017 FINAL  Final  Culture, blood (Routine X 2) w Reflex to ID Panel     Status: None (Preliminary result)   Collection Time: 06/07/17  5:52 AM  Result Value Ref Range Status   Specimen Description BLOOD RIGHT ANTECUBITAL  Final   Special Requests AEROBIC BOTTLE ONLY Blood Culture adequate volume  Final   Culture   Final    NO GROWTH 2 DAYS Performed at [facility], [address] [place], [place] [address]    Report Status PENDING  Incomplete  Culture, blood (Routine X 2) w Reflex to ID Panel     Status: None (  Preliminary result)   Collection Time: 06/07/17  5:52 AM  Result Value Ref Range Status   Specimen Description BLOOD RIGHT HAND  Final   Special Requests AEROBIC BOTTLE ONLY Blood Culture adequate volume  Final   Culture   Final    NO GROWTH 2 DAYS Performed at [facility], [address] [place], [place] [address]    Report Status PENDING  Incomplete  Surgical pcr screen     Status: Abnormal   Collection Time: 06/08/17  5:37 AM  Result Value Ref Range Status   MRSA, PCR NEGATIVE NEGATIVE Final   Staphylococcus aureus POSITIVE (A) NEGATIVE Final    Comment: (NOTE) The Xpert SA Assay (FDA approved for NASAL specimens in patients 45 years of age and older), is one component of a comprehensive surveillance program. It is not intended to diagnose infection nor to guide or monitor treatment.       Radiology Studies: No results found.  Scheduled Meds: . acidophilus   Oral Daily  . cholecalciferol  2,000 Units Oral Daily  . ferrous gluconate  324 mg Oral Q breakfast  . gabapentin  300 mg Oral QHS  . insulin aspart  0-9 Units  Subcutaneous TID WC  . insulin glargine  10 Units Subcutaneous Daily  . insulin glargine  25 Units Subcutaneous QHS  . multivitamin with minerals  1 tablet Oral Daily  . nystatin cream  1 application Topical BID  . polyvinyl alcohol  1 drop Both Eyes TID  . prednisoLONE acetate  1 drop Both Eyes BID  . vitamin C   Oral Daily   Continuous Infusions: . heparin 1,450 Units/hr (06/09/17 1245)  . piperacillin-tazobactam (ZOSYN)  IV 3.375 g (06/09/17 1301)    Marzetta Board, MD, PhD [facility] Pager [phone]  If 7PM-7AM, please contact night-coverage [email or web address] Password [email or web address] 06/09/2017, 1:46 PM

## 2017-06-09 NOTE — Progress Notes (Signed)
ANTICOAGULATION CONSULT NOTE - Follow Up Consult  Pharmacy Consult for Heparin Indication: pulmonary embolus, PTA Pradaxa held  Allergies  Allergen Reactions  . Azathioprine Shortness Of Breath  . Cellcept [Mycophenolate Mofetil] Nausea Only    severe nausea  . Ramipril Cough    Patient Measurements: Height: 5\' 4"  (162.6 cm) Weight: 247 lb 9.2 oz (112.3 kg) IBW/kg (Calculated) : 54.7 Heparin Dosing Weight: 82 kg  Vital Signs: Temp: 98.5 F (36.9 C) (09/22 0533) Temp Source: Oral (09/22 0533) BP: 111/65 (09/22 0533) Pulse Rate: 75 (09/22 0533)  Labs:  Recent Labs  06/06/17 2320 06/07/17 0552 06/08/17 0435 06/09/17 0046 06/09/17 0523  HGB 15.6* 14.5 13.6  --  12.8  HCT 46.2* 43.7 41.5  --  37.5  PLT 171 170 143*  --  135*  APTT  --  32  --   --   --   LABPROT 14.6  --   --   --   --   INR 1.14  --   --   --   --   HEPARINUNFRC  --   --   --  0.14*  --   CREATININE 0.78 0.89 0.90  --  0.81    Estimated Creatinine Clearance: 73.6 mL/min (by C-G formula based on SCr of 0.81 mg/dL).   Medications:  Scheduled:  . acidophilus   Oral Daily  . cholecalciferol  2,000 Units Oral Daily  . ferrous gluconate  324 mg Oral Q breakfast  . gabapentin  300 mg Oral QHS  . insulin aspart  0-9 Units Subcutaneous TID WC  . insulin glargine  10 Units Subcutaneous Daily  . insulin glargine  25 Units Subcutaneous QHS  . multivitamin with minerals  1 tablet Oral Daily  . nystatin cream  1 application Topical BID  . polyvinyl alcohol  1 drop Both Eyes TID  . prednisoLONE acetate  1 drop Both Eyes BID  . vitamin C   Oral Daily   Infusions:  . heparin 1,300 Units/hr (06/09/17 0153)  . piperacillin-tazobactam (ZOSYN)  IV 3.375 g (06/09/17 0430)    Assessment: 27 yoF admitted with nausea and vomiting on chronic Pradaxa for hx of PE.  IV heparin per Rx while pradaxa on hold.   S/p Lap Chole 9/21 at 1235.  No bleeding or complications reported.  Per Dr. Kieth Brightly care order, resume  heparin on 9/21 at 1600.  Today, 06/09/2017:  Heparin level 0.26, subtherapeutic  CBC: Hgb decreased to 12.8, Plt decreased to 135.  SCr stable   No bleeding or complications reported.  Pt reports abdominal pain.  No IV interruptions or pauses.  Goal of Therapy:  Heparin level 0.3-0.7 units/ml Monitor platelets by anticoagulation protocol: Yes   Plan:   Increase to heparin IV infusion at 1450 units/hr  Heparin level in 8 hours after rate change  Daily heparin level and CBC  Continue to monitor H&H and platelets  Follow up plans to resume Pradaxa.  Gretta Arab PharmD, BCPS Pager [phone] 06/09/2017 10:14 AM

## 2017-06-09 NOTE — Progress Notes (Signed)
Brief Pharmacy Note re: IV heparin  See earlier progress note from C Shade, PharmD for complete details  O:  Heparin level 0.25 on 1450 units/hr (goal 0.3-0.7)       No bleeding or infusion related issued reported by RN  A/P:  Heparin level unchanged from earlier despite rate increase.  Will aggressively increase heparin infusion to 1700 units/hr (2 units/kg/hr based on Actual BW)  Recheck 8hr heparin level after rate change with AM labs  Daily heparin level & CBC while on heparin  Netta Cedars, PharmD, BCPS 06/09/2017@9 :16 PM

## 2017-06-10 ENCOUNTER — Inpatient Hospital Stay (HOSPITAL_COMMUNITY): Payer: Medicare Other

## 2017-06-10 DIAGNOSIS — R509 Fever, unspecified: Secondary | ICD-10-CM

## 2017-06-10 LAB — CBC
HEMATOCRIT: 40.3 % (ref 36.0–46.0)
HEMOGLOBIN: 13.1 g/dL (ref 12.0–15.0)
MCH: 32.1 pg (ref 26.0–34.0)
MCHC: 32.5 g/dL (ref 30.0–36.0)
MCV: 98.8 fL (ref 78.0–100.0)
Platelets: 181 10*3/uL (ref 150–400)
RBC: 4.08 MIL/uL (ref 3.87–5.11)
RDW: 13 % (ref 11.5–15.5)
WBC: 12.1 10*3/uL — AB (ref 4.0–10.5)

## 2017-06-10 LAB — GLUCOSE, CAPILLARY
GLUCOSE-CAPILLARY: 123 mg/dL — AB (ref 65–99)
GLUCOSE-CAPILLARY: 234 mg/dL — AB (ref 65–99)
GLUCOSE-CAPILLARY: 137 mg/dL — AB (ref 65–99)
Glucose-Capillary: 147 mg/dL — ABNORMAL HIGH (ref 65–99)

## 2017-06-10 LAB — HEPARIN LEVEL (UNFRACTIONATED)
HEPARIN UNFRACTIONATED: 0.37 [IU]/mL (ref 0.30–0.70)
Heparin Unfractionated: 0.38 IU/mL (ref 0.30–0.70)

## 2017-06-10 NOTE — Progress Notes (Addendum)
Report called to [facility].  All questions answered.  Pt going to x-ray and then to new room 1425.

## 2017-06-10 NOTE — Progress Notes (Signed)
ANTICOAGULATION CONSULT NOTE - Follow Up Consult  Pharmacy Consult for Heparin Indication: pulmonary embolus, PTA Pradaxa held  Allergies  Allergen Reactions  . Azathioprine Shortness Of Breath  . Cellcept [Mycophenolate Mofetil] Nausea Only    severe nausea  . Ramipril Cough    Patient Measurements: Height: 5\' 4"  (162.6 cm) Weight: 247 lb 9.2 oz (112.3 kg) IBW/kg (Calculated) : 54.7 Heparin Dosing Weight: 82 kg  Vital Signs: Temp: 98.9 F (37.2 C) (09/23 1220) Temp Source: Oral (09/23 1220) BP: 114/66 (09/23 1220) Pulse Rate: 112 (09/23 1220)  Labs:  Recent Labs  06/08/17 0435  06/09/17 0523  06/09/17 2023 06/10/17 0549 06/10/17 1310  HGB 13.6  --  12.8  --   --  13.1  --   HCT 41.5  --  37.5  --   --  40.3  --   PLT 143*  --  135*  --   --  181  --   HEPARINUNFRC  --   < >  --   < > 0.25* 0.38 0.37  CREATININE 0.90  --  0.81  --   --   --   --   < > = values in this interval not displayed.  Estimated Creatinine Clearance: 73.6 mL/min (by C-G formula based on SCr of 0.81 mg/dL).   Medications:  Scheduled:  . acidophilus   Oral Daily  . cholecalciferol  2,000 Units Oral Daily  . ferrous gluconate  324 mg Oral Q breakfast  . gabapentin  300 mg Oral QHS  . insulin aspart  0-9 Units Subcutaneous TID WC  . insulin glargine  10 Units Subcutaneous Daily  . insulin glargine  25 Units Subcutaneous QHS  . multivitamin with minerals  1 tablet Oral Daily  . nystatin cream  1 application Topical BID  . polyvinyl alcohol  1 drop Both Eyes TID  . prednisoLONE acetate  1 drop Both Eyes BID  . torsemide  20 mg Oral Daily  . vitamin C   Oral Daily   Infusions:  . cefTRIAXone (ROCEPHIN)  IV Stopped (06/09/17 2100)  . heparin 1,700 Units/hr (06/10/17 1019)    Assessment: 7 yoF admitted with nausea and vomiting on chronic Pradaxa for hx of PE.  IV heparin per Rx while pradaxa on hold.   S/p Lap Chole 9/21 at 1235.  No bleeding or complications reported.  Per Dr.  Kieth Brightly care order, resume heparin on 9/21 at 1600.  Today, 06/10/2017:  Heparin level 0.37, therapeutic  CBC: Hgb improved, Plt WNL  SCr stable   No bleeding or complications reported.  Pt reports abdominal pain.  No IV interruptions or pauses.  Goal of Therapy:  Heparin level 0.3-0.7 units/ml Monitor platelets by anticoagulation protocol: Yes   Plan:   Continue heparin IV infusion at 1700 units/hr  Heparin level in 8 hours after rate change  Daily heparin level and CBC  Continue to monitor H&H and platelets  Follow up plans to resume Pradaxa - Monday 9/24?  Gretta Arab PharmD, BCPS Pager [phone] 06/10/2017 3:00 PM

## 2017-06-10 NOTE — Progress Notes (Signed)
[facility]:  [phone] General Surgery Progress Note   LOS: 3 days  POD -  2 Days Post-Op  Chief Complaint: Abdominal pain  Assessment and Plan: 1.  LAPAROSCOPIC CHOLECYSTECTOMY - 06/09/2017 - [facility]  WBC- 12,100 - 06/10/2017  Rocephin - 9/22 >>  Doing okay - still needs to work on IS.  She had some pain meds just before I saw her, so she is a little groggy.  Fever last night.  She is expecting to go home on her own.  She needs to ambulate more than she has.  2.  Anticoagulated on Pradaxa - bridged with heparin  For history of DVT/stroke  Can restart Pradaxa tonight or Monday, if otherwise doing well 3.  Obese 4.  DVT prophylaxis - on IV heparin   Principal Problem:   Gallstone pancreatitis Active Problems:   HYPERCHOLESTEROLEMIA   Essential hypertension   History of deep venous thrombosis   Sepsis (HCC)   Pancreatic cyst   Hyperkalemia   Abnormal LFTs   UTI (urinary tract infection)   Choledocholithiasis   Chronic diastolic CHF (congestive heart failure) (HCC)   Subjective:  Groggy from meds.  She thinks that she can go home today - I am less certain.  Objective:   Vitals:   06/10/17 0542 06/10/17 0641  BP: 100/62   Pulse: (!) 111   Resp: 20   Temp: (!) 101.7 F (38.7 C) 100.3 F (37.9 C)  SpO2: 94%      Intake/Output from previous day:  09/22 0701 - 09/23 0700 In: 848.1 [P.O.:560; I.V.:288.1] Out: 2350 [Urine:2350]  Intake/Output this shift:  No intake/output data recorded.   Physical Exam:   General: Obese WF who is oriented, though a little groggy.   HEENT: Normal. Pupils equal. .   Lungs: IS - 700 cc - not much better than yesterday.   Abdomen: Obese.  Less abdominal pain.   Wound: Clear   Lab Results:     Recent Labs  06/09/17 0523 06/10/17 0549  WBC 12.3* 12.1*  HGB 12.8 13.1  HCT 37.5 40.3  PLT 135* 181    BMET    Recent Labs  06/08/17 0435 06/09/17 0523  NA 138 138  K 3.1* 3.9  CL 107 110  CO2  23 22  GLUCOSE 187* 221*  BUN 20 19  CREATININE 0.90 0.81  CALCIUM 7.9* 7.3*    PT/INR  No results for input(s): LABPROT, INR in the last 72 hours.  ABG  No results for input(s): PHART, HCO3 in the last 72 hours.  Invalid input(s): PCO2, PO2   Studies/Results:  No results found.   Anti-infectives:   Anti-infectives    Start     Dose/Rate Route Frequency Ordered Stop   06/09/17 2000  cefTRIAXone (ROCEPHIN) 2 g in dextrose 5 % 50 mL IVPB     2 g 100 mL/hr over 30 Minutes Intravenous Every 24 hours 06/09/17 1347     06/08/17 1048  piperacillin-tazobactam (ZOSYN) 3.375 GM/50ML IVPB    Comments:  Bridget Hartshorn   : cabinet override      06/08/17 1048 06/08/17 1049   06/07/17 1200  piperacillin-tazobactam (ZOSYN) IVPB 3.375 g  Status:  Discontinued     3.375 g 12.5 mL/hr over 240 Minutes Intravenous Every 8 hours 06/07/17 0421 06/09/17 1347   06/07/17 0345  piperacillin-tazobactam (ZOSYN) IVPB 3.375 g     3.375 g 100 mL/hr over 30 Minutes Intravenous NOW 06/07/17 0335 06/07/17 0408   06/07/17 0330  cefTRIAXone (ROCEPHIN) 1 g in dextrose 5 % 50 mL IVPB  Status:  Discontinued     1 g 100 mL/hr over 30 Minutes Intravenous  Once 06/07/17 0326 06/07/17 0333      Alphonsa Overall, MD, FACS Pager: [facility]: [phone] 06/10/2017

## 2017-06-10 NOTE — Progress Notes (Signed)
Temp at 0530 was 101.7.  Given Tylenol and 1 vicodin.  Reinforced IS/DB&C.  Recheck of temp 0640 it is now 100.3

## 2017-06-10 NOTE — Progress Notes (Signed)
PROGRESS NOTE  Julie Mora KXF:[id number] DOB: 05-29-1941 DOA: 06/06/2017 PCP: Elby Showers, MD   LOS: 3 days   Brief Narrative / Interim history: pleasant 76 year old female with history of hypertension, hyperlipidemia, diabetes, prior CVA, DVT on chronic anticoagulation who presented to the emergency room with abdominal pain, nausea vomiting.  She was found to have acute pancreatitis as well as cholelithiasis with a slightly dilated CBD.  Assessment & Plan: Principal Problem:   Gallstone pancreatitis Active Problems:   HYPERCHOLESTEROLEMIA   Essential hypertension   History of deep venous thrombosis   Sepsis (HCC)   Pancreatic cyst   Hyperkalemia   Abnormal LFTs   UTI (urinary tract infection)   Choledocholithiasis   Chronic diastolic CHF (congestive heart failure) (HCC)   Gallstone pancreatitis -Supportive treatment, IV fluids, pain control. SIRS likely due to pancreatitis rather than infection. -Improving, she underwent an MRCP on 9/20 which was negative for stone in CBD -Patient is status post laparoscopic cholecystectomy on 9/21, recovering well postop, has some pain which appears to be controlled with Tylenol alone -Liver enzymes are improving, bilirubin has now normalized. -febrile today, obtain CXR, UA. Already on antibiotics, monitor fever curve  UTI -patient placed on Zosyn on admission, continue, will cover for GI source as well, urine cultures with multiple species, final -Narrow to ceftriaxone, continue since she is now febrile  Hypertension -Hydralazine as needed, torsemide back on today   History of DVT -Resume anticoagulation on Monday  Chronic diastolic CHF -Stable, she looked a little bit more fluid overloaded 9/22, received IV Lasix 1 -Continue torsemide   DVT prophylaxis: Per general surgery Code Status: Full code Family Communication: No family at bedside Disposition Plan: Home when ready stop PT evaluation pending, however patient  tells me that she will not go to an SNF  Consultants:   Gastroenterology  General surgery  Procedures:   none  Antimicrobials:  Zosyn 9/20 >> 9/22  Ceftriaxone 9/22 >>  Subjective: -Feels like her abdominal pain is improved.  Denies any shortness of breath or chest pain.  No nausea or vomiting.  No cough.  Objective: Vitals:   06/09/17 0533 06/09/17 2119 06/10/17 0542 06/10/17 0641  BP: 111/65 130/65 100/62   Pulse: 75 94 (!) 111   Resp: (!) 21 20 20    Temp: 98.5 F (36.9 C) 99.9 F (37.7 C) (!) 101.7 F (38.7 C) 100.3 F (37.9 C)  TempSrc: Oral Oral Oral   SpO2: 94% 95% 94%   Weight:      Height:        Intake/Output Summary (Last 24 hours) at 06/10/17 1123 Last data filed at 06/10/17 1013  Gross per 24 hour  Intake           968.09 ml  Output             2350 ml  Net         -1381.91 ml   Filed Weights   06/07/17 0434  Weight: 112.3 kg (247 lb 9.2 oz)    Examination:  Constitutional: NAD, calm, comfortable Eyes: lids and conjunctivae normal Respiratory: clear to auscultation bilaterally, no wheezing, no crackles. Normal respiratory effort.  Cardiovascular: Regular rate and rhythm, no murmurs / rubs / gallops. 1+ LE edema. 2+ pedal pulses.  Abdomen: no tenderness. Bowel sounds positive.  Skin: no rashes, lesions, ulcers. No induration Neurologic: non focal  Data Reviewed: I have independently reviewed following labs and imaging studies   CBC:  Recent Labs Lab 06/06/17 2320 06/07/17  2505 06/08/17 0435 06/09/17 0523 06/10/17 0549  WBC 15.5* 16.6* 18.5* 12.3* 12.1*  HGB 15.6* 14.5 13.6 12.8 13.1  HCT 46.2* 43.7 41.5 37.5 40.3  MCV 97.1 98.0 96.7 95.9 98.8  PLT 171 170 143* 135* 397   Basic Metabolic Panel:  Recent Labs Lab 06/06/17 2320 06/07/17 0552 06/08/17 0435 06/09/17 0523  NA 137 141 138 138  K 5.2* 4.2 3.1* 3.9  CL 104 107 107 110  CO2 21* 24 23 22   GLUCOSE 330* 279* 187* 221*  BUN 25* 24* 20 19  CREATININE 0.78 0.89 0.90  0.81  CALCIUM 9.2 8.6* 7.9* 7.3*   GFR: Estimated Creatinine Clearance: 73.6 mL/min (by C-G formula based on SCr of 0.81 mg/dL). Liver Function Tests:  Recent Labs Lab 06/06/17 2320 06/07/17 0552 06/08/17 0435 06/09/17 0523  AST 355* 221* 73* 82*  ALT 438* 363* 216* 151*  ALKPHOS 153* 146* 121 100  BILITOT 4.6* 2.4* 1.1 0.7  PROT 7.0 6.5 5.8* 5.3*  ALBUMIN 3.4* 3.1* 2.6* 2.2*    Recent Labs Lab 06/06/17 2320 06/08/17 0435  LIPASE 343* 27   No results for input(s): AMMONIA in the last 168 hours. Coagulation Profile:  Recent Labs Lab 06/06/17 2320  INR 1.14   Cardiac Enzymes: No results for input(s): CKTOTAL, CKMB, CKMBINDEX, TROPONINI in the last 168 hours. BNP (last 3 results) No results for input(s): PROBNP in the last 8760 hours. HbA1C: No results for input(s): HGBA1C in the last 72 hours. CBG:  Recent Labs Lab 06/09/17 0835 06/09/17 1254 06/09/17 1706 06/09/17 2117 06/10/17 0742  GLUCAP 168* 218* 222* 168* 123*   Lipid Profile: No results for input(s): CHOL, HDL, LDLCALC, TRIG, CHOLHDL, LDLDIRECT in the last 72 hours. Thyroid Function Tests: No results for input(s): TSH, T4TOTAL, FREET4, T3FREE, THYROIDAB in the last 72 hours. Anemia Panel: No results for input(s): VITAMINB12, FOLATE, FERRITIN, TIBC, IRON, RETICCTPCT in the last 72 hours. Urine analysis:    Component Value Date/Time   COLORURINE YELLOW 06/07/2017 0234   APPEARANCEUR HAZY (A) 06/07/2017 0234   LABSPEC 1.021 06/07/2017 0234   PHURINE 6.0 06/07/2017 0234   GLUCOSEU >=500 (A) 06/07/2017 0234   HGBUR SMALL (A) 06/07/2017 0234   BILIRUBINUR NEGATIVE 06/07/2017 0234   BILIRUBINUR negative 04/23/2017 1205   KETONESUR 5 (A) 06/07/2017 0234   PROTEINUR NEGATIVE 06/07/2017 0234   UROBILINOGEN 0.2 04/23/2017 1205   UROBILINOGEN 0.2 12/11/2010 1434   NITRITE POSITIVE (A) 06/07/2017 0234   LEUKOCYTESUR LARGE (A) 06/07/2017 0234   Sepsis Labs: Invalid input(s): PROCALCITONIN,  LACTICIDVEN  Recent Results (from the past 240 hour(s))  Urine culture     Status: Abnormal   Collection Time: 06/07/17  2:34 AM  Result Value Ref Range Status   Specimen Description URINE, RANDOM  Final   Special Requests NONE  Final   Culture MULTIPLE SPECIES PRESENT, SUGGEST RECOLLECTION (A)  Final   Report Status 06/08/2017 FINAL  Final  Culture, blood (Routine X 2) w Reflex to ID Panel     Status: None (Preliminary result)   Collection Time: 06/07/17  5:52 AM  Result Value Ref Range Status   Specimen Description BLOOD RIGHT ANTECUBITAL  Final   Special Requests AEROBIC BOTTLE ONLY Blood Culture adequate volume  Final   Culture   Final    NO GROWTH 2 DAYS Performed at [facility], [address], [place], [place] [address]    Report Status PENDING  Incomplete  Culture, blood (Routine X 2) w Reflex to  ID Panel     Status: None (Preliminary result)   Collection Time: 06/07/17  5:52 AM  Result Value Ref Range Status   Specimen Description BLOOD RIGHT HAND  Final   Special Requests AEROBIC BOTTLE ONLY Blood Culture adequate volume  Final   Culture   Final    NO GROWTH 2 DAYS Performed at [facility], [place] [address] [place], [place] [address]    Report Status PENDING  Incomplete  Surgical pcr screen     Status: Abnormal   Collection Time: 06/08/17  5:37 AM  Result Value Ref Range Status   MRSA, PCR NEGATIVE NEGATIVE Final   Staphylococcus aureus POSITIVE (A) NEGATIVE Final    Comment: (NOTE) The Xpert SA Assay (FDA approved for NASAL specimens in patients 110 years of age and older), is one component of a comprehensive surveillance program. It is not intended to diagnose infection nor to guide or monitor treatment.       Radiology Studies: No results found.  Scheduled Meds: . acidophilus   Oral Daily  . cholecalciferol  2,000 Units Oral Daily  . ferrous gluconate  324 mg Oral Q breakfast  . gabapentin  300 mg Oral QHS  . insulin aspart  0-9 Units  Subcutaneous TID WC  . insulin glargine  10 Units Subcutaneous Daily  . insulin glargine  25 Units Subcutaneous QHS  . multivitamin with minerals  1 tablet Oral Daily  . nystatin cream  1 application Topical BID  . polyvinyl alcohol  1 drop Both Eyes TID  . prednisoLONE acetate  1 drop Both Eyes BID  . torsemide  20 mg Oral Daily  . vitamin C   Oral Daily   Continuous Infusions: . cefTRIAXone (ROCEPHIN)  IV Stopped (06/09/17 2100)  . heparin 1,700 Units/hr (06/10/17 1019)    Marzetta Board, MD, PhD [facility] Pager [phone]  If 7PM-7AM, please contact night-coverage [email or web address] Password [facility] 06/10/2017, 11:23 AM

## 2017-06-10 NOTE — Progress Notes (Signed)
Brief Pharmacy Note re: IV heparin  See  progress note from 9/22 by C Shade, PharmD  for complete details  O:  Heparin level 0.38 on 1700 units/hr (goal 0.3-0.7)       No bleeding or infusion related issued reported by RN  A/P:  Heparin level therapeutic, will recheck HL today to ensure stays in therapeutic range.   Recheck 8hr heparin level after rate change with AM labs  Daily heparin level & CBC while on heparin  Lawana Pai R 06/10/2017@6 :24 AM

## 2017-06-11 LAB — CBC
HEMATOCRIT: 38.4 % (ref 36.0–46.0)
HEMOGLOBIN: 12.5 g/dL (ref 12.0–15.0)
MCH: 32.1 pg (ref 26.0–34.0)
MCHC: 32.6 g/dL (ref 30.0–36.0)
MCV: 98.7 fL (ref 78.0–100.0)
Platelets: 184 10*3/uL (ref 150–400)
RBC: 3.89 MIL/uL (ref 3.87–5.11)
RDW: 13.3 % (ref 11.5–15.5)
WBC: 10.7 10*3/uL — ABNORMAL HIGH (ref 4.0–10.5)

## 2017-06-11 LAB — URINALYSIS, ROUTINE W REFLEX MICROSCOPIC
Bilirubin Urine: NEGATIVE
GLUCOSE, UA: NEGATIVE mg/dL
KETONES UR: NEGATIVE mg/dL
NITRITE: NEGATIVE
PH: 6 (ref 5.0–8.0)
Protein, ur: NEGATIVE mg/dL

## 2017-06-11 LAB — GLUCOSE, CAPILLARY
GLUCOSE-CAPILLARY: 142 mg/dL — AB (ref 65–99)
GLUCOSE-CAPILLARY: 219 mg/dL — AB (ref 65–99)
Glucose-Capillary: 78 mg/dL (ref 65–99)
Glucose-Capillary: 150 mg/dL — ABNORMAL HIGH (ref 65–99)

## 2017-06-11 LAB — URINALYSIS, MICROSCOPIC (REFLEX)

## 2017-06-11 LAB — HEPARIN LEVEL (UNFRACTIONATED): HEPARIN UNFRACTIONATED: 0.42 [IU]/mL (ref 0.30–0.70)

## 2017-06-11 MED ORDER — MUPIROCIN 2 % EX OINT
1.0000 "application " | TOPICAL_OINTMENT | Freq: Two times a day (BID) | CUTANEOUS | Status: DC
  Administered 2017-06-11 – 2017-06-12 (×3): 1 via NASAL
  Filled 2017-06-11 (×3): qty 22

## 2017-06-11 MED ORDER — CHLORHEXIDINE GLUCONATE CLOTH 2 % EX PADS
6.0000 | MEDICATED_PAD | Freq: Every day | CUTANEOUS | Status: DC
  Administered 2017-06-11: 6 via TOPICAL

## 2017-06-11 MED ORDER — PREMIER PROTEIN SHAKE
11.0000 [oz_av] | Freq: Two times a day (BID) | ORAL | Status: DC
  Administered 2017-06-11: 11 [oz_av] via ORAL

## 2017-06-11 MED ORDER — FUROSEMIDE 10 MG/ML IJ SOLN
40.0000 mg | Freq: Once | INTRAMUSCULAR | Status: AC
  Administered 2017-06-11: 40 mg via INTRAVENOUS
  Filled 2017-06-11: qty 4

## 2017-06-11 MED ORDER — TORSEMIDE 20 MG PO TABS
20.0000 mg | ORAL_TABLET | Freq: Every day | ORAL | Status: DC
  Administered 2017-06-12: 20 mg via ORAL
  Filled 2017-06-11: qty 1

## 2017-06-11 MED ORDER — POTASSIUM CHLORIDE CRYS ER 20 MEQ PO TBCR
40.0000 meq | EXTENDED_RELEASE_TABLET | Freq: Once | ORAL | Status: AC
  Administered 2017-06-11: 40 meq via ORAL
  Filled 2017-06-11: qty 2

## 2017-06-11 MED ORDER — DABIGATRAN ETEXILATE MESYLATE 75 MG PO CAPS
75.0000 mg | ORAL_CAPSULE | Freq: Two times a day (BID) | ORAL | Status: DC
  Administered 2017-06-11 – 2017-06-12 (×3): 75 mg via ORAL
  Filled 2017-06-11 (×4): qty 1

## 2017-06-11 MED ORDER — BISACODYL 10 MG RE SUPP: 10.0000 mg | Freq: Every day | RECTAL | Status: DC | PRN

## 2017-06-11 NOTE — Progress Notes (Addendum)
ANTICOAGULATION CONSULT NOTE - Follow Up Consult  Pharmacy Consult for Pradaxa Indication: pulmonary embolus, PTA Pradaxa 75mg  bid held for possible surgery  Allergies  Allergen Reactions  . Azathioprine Shortness Of Breath  . Cellcept [Mycophenolate Mofetil] Nausea Only    severe nausea  . Ramipril Cough   Patient Measurements: Height: 5\' 4"  (162.6 cm) Weight: 247 lb 9.2 oz (112.3 kg) IBW/kg (Calculated) : 54.7 Heparin Dosing Weight: 82 kg  Vital Signs: Temp: 99 F (37.2 C) (09/24 0633) Temp Source: Oral (09/24 7622) BP: 115/87 (09/24 6333) Pulse Rate: 105 (09/24 0633)  Labs:  Recent Labs  06/09/17 0523  06/10/17 0549 06/10/17 1310 06/11/17 0536  HGB 12.8  --  13.1  --  12.5  HCT 37.5  --  40.3  --  38.4  PLT 135*  --  181  --  184  HEPARINUNFRC  --   < > 0.38 0.37 0.42  CREATININE 0.81  --   --   --   --   < > = values in this interval not displayed.  Estimated Creatinine Clearance: 73.6 mL/min (by C-G formula based on SCr of 0.81 mg/dL).  Medications:  Scheduled:  . acidophilus   Oral Daily  . cholecalciferol  2,000 Units Oral Daily  . ferrous gluconate  324 mg Oral Q breakfast  . gabapentin  300 mg Oral QHS  . insulin aspart  0-9 Units Subcutaneous TID WC  . insulin glargine  10 Units Subcutaneous Daily  . insulin glargine  25 Units Subcutaneous QHS  . multivitamin with minerals  1 tablet Oral Daily  . nystatin cream  1 application Topical BID  . polyvinyl alcohol  1 drop Both Eyes TID  . prednisoLONE acetate  1 drop Both Eyes BID  . [START ON 06/12/2017] torsemide  20 mg Oral Daily  . vitamin C   Oral Daily   Infusions:  . cefTRIAXone (ROCEPHIN)  IV Stopped (06/10/17 2000)  . heparin 1,700 Units/hr (06/11/17 0254)   Assessment: 23 yoF admitted with nausea and vomiting on chronic Pradaxa for hx of PE.  IV heparin per Rx while pradaxa on hold.   S/p Lap Chole 9/21 at 1235.  No bleeding or complications reported.  Per Dr. Kieth Brightly care order, resume  heparin on 9/21 at 1600.  Today, 06/11/2017:  Heparin level 0.42, therapeutic  Anticipate transition back to Pradaxa today (PTA 75mg  bid)  CBC: Hgb stable, Plt WNL  SCr stable   No bleeding or complications reported. No IV interruptions or pauses.  Goal of Therapy:  Heparin level 0.3-0.7 units/ml Monitor platelets by anticoagulation protocol: Yes   Plan:   No change to Heparin IV infusion at 1700 units/hr  No surgery needed per CCS, can resume Pradaxa 75mg  bid  If Pradaxa resumed, would discontinue Heparin at same time Pradaxa given  Daily heparin level and CBC  Continue to monitor H&H and platelets  Minda Ditto PharmD Pager [phone] 06/11/2017, 9:56 AM   Addendum 11:10 AM Resume Pradaxa, discontinue Heparin at same time of 1st dose Pradaxa 75mg  bid  Minda Ditto PharmD Pager [phone] 06/11/2017, 11:11 AM

## 2017-06-11 NOTE — Progress Notes (Signed)
Patient ID: Julie Mora, female   DOB: August 18, 1941, 76 y.o.   MRN: [id number]  [facility] Surgery Progress Note  3 Days Post-Op  Subjective: CC- gallstone pancreatitis No complaints this morning. Denies any abdominal pain. Tolerating diet with no n/v. Has not had a BM since admission, but feels like she may have one soon. Passing flatus.  Objective: Vital signs in last 24 hours: Temp:  [98.9 F (37.2 C)-99.9 F (37.7 C)] 99 F (37.2 C) (09/24 9528) Pulse Rate:  [100-112] 105 (09/24 0633) Resp:  [16-20] 18 (09/24 4132) BP: (114-115)/(66-87) 115/87 (09/24 4401) SpO2:  [90 %-95 %] 90 % (09/24 0633) Last BM Date: 06/07/17  Intake/Output from previous day: 09/23 0701 - 09/24 0700 In: 409 [P.O.:240; I.V.:119; IV Piggyback:50] Out: 425 [Urine:425] Intake/Output this shift: No intake/output data recorded.  PE: Gen:  Alert, NAD, pleasant HEENT: EOM's intact, pupils equal and round Pulm:  CTAB, no W/R/R, effort normal Abd: Soft, NT/ND, +BS, multiple lap incisions C/D/I with steris in place Skin: no rashes noted, warm and dry  Lab Results:   Recent Labs  06/10/17 0549 06/11/17 0536  WBC 12.1* 10.7*  HGB 13.1 12.5  HCT 40.3 38.4  PLT 181 184   BMET  Recent Labs  06/09/17 0523  NA 138  K 3.9  CL 110  CO2 22  GLUCOSE 221*  BUN 19  CREATININE 0.81  CALCIUM 7.3*   PT/INR No results for input(s): LABPROT, INR in the last 72 hours. CMP     Component Value Date/Time   NA 138 06/09/2017 0523   NA 137 03/29/2017 0840   K 3.9 06/09/2017 0523   K 4.3 03/29/2017 0840   CL 110 06/09/2017 0523   CO2 22 06/09/2017 0523   CO2 21 (L) 03/29/2017 0840   GLUCOSE 221 (H) 06/09/2017 0523   GLUCOSE 389 (H) 03/29/2017 0840   BUN 19 06/09/2017 0523   BUN 34.0 (H) 03/29/2017 0840   CREATININE 0.81 06/09/2017 0523   CREATININE 1.4 (H) 03/29/2017 0840   CALCIUM 7.3 (L) 06/09/2017 0523   CALCIUM 9.7 03/29/2017 0840   PROT 5.3 (L) 06/09/2017 0523   PROT 6.5 03/29/2017  0840   ALBUMIN 2.2 (L) 06/09/2017 0523   ALBUMIN 2.9 (L) 03/29/2017 0840   AST 82 (H) 06/09/2017 0523   AST 15 03/29/2017 0840   ALT 151 (H) 06/09/2017 0523   ALT 15 03/29/2017 0840   ALKPHOS 100 06/09/2017 0523   ALKPHOS 72 03/29/2017 0840   BILITOT 0.7 06/09/2017 0523   BILITOT 0.52 03/29/2017 0840   GFRNONAA >60 06/09/2017 0523   GFRNONAA 57 (L) 01/12/2015 1027   GFRAA >60 06/09/2017 0523   GFRAA 65 01/12/2015 1027   Lipase     Component Value Date/Time   LIPASE 27 06/08/2017 0435       Studies/Results: Dg Chest 2 View  Result Date: 06/10/2017 CLINICAL DATA:  Recent cholecystectomy.  Fever EXAM: CHEST  2 VIEW COMPARISON:  MRI 9/26 18, chest radiograph 01/19/2017 FINDINGS: Stable cardiac silhouette. Low lung volumes. Mild basilar atelectasis. Mild central venous congestion. Small effusions noted on lateral projection IMPRESSION: 1. Low lung volumes, central venous congestion, and small effusions. 2. No clear evidence pneumonia Electronically Signed   By: Suzy Bouchard M.D.   On: 06/10/2017 13:33    Anti-infectives: Anti-infectives    Start     Dose/Rate Route Frequency Ordered Stop   06/09/17 2000  cefTRIAXone (ROCEPHIN) 2 g in dextrose 5 % 50 mL IVPB  2 g 100 mL/hr over 30 Minutes Intravenous Every 24 hours 06/09/17 1347     06/08/17 1048  piperacillin-tazobactam (ZOSYN) 3.375 GM/50ML IVPB    Comments:  Bridget Hartshorn   : cabinet override      06/08/17 1048 06/08/17 1049   06/07/17 1200  piperacillin-tazobactam (ZOSYN) IVPB 3.375 g  Status:  Discontinued     3.375 g 12.5 mL/hr over 240 Minutes Intravenous Every 8 hours 06/07/17 0421 06/09/17 1347   06/07/17 0345  piperacillin-tazobactam (ZOSYN) IVPB 3.375 g     3.375 g 100 mL/hr over 30 Minutes Intravenous NOW 06/07/17 0335 06/07/17 0408   06/07/17 0330  cefTRIAXone (ROCEPHIN) 1 g in dextrose 5 % 50 mL IVPB  Status:  Discontinued     1 g 100 mL/hr over 30 Minutes Intravenous  Once 06/07/17 0326 06/07/17 0333        Assessment/Plan H/o DVT/stroke on Pradaxa UTI - on rocephin HTN CHF  Gallstone pancreatitis S/p lap chole 9/21 Dr. Kieth Brightly - POD 3 - WBC trending down 10.7, TMAX 99.9 - tolerating diet, pain well controlled  ID - Rocephin 9/22>>day#3, zosyn 9/20>>9/22 FEN - heart healthy diet VTE - heparin Foley - none Follow up - [facility] clinic 2-3 weeks  Freedom to transition back to pradaxa. Ready for discharge from surgical standpoint. She does not need to continue antibiotics for her gallbladder surgery. General surgery will sign off, please call with concerns.   LOS: 4 days    Wellington Hampshire , [facility] Surgery 06/11/2017, 9:09 AM Pager: [phone] Consults: [phone] Mon-Fri 7:00 am-4:30 pm Sat-Sun 7:00 am-11:30 am

## 2017-06-11 NOTE — Progress Notes (Signed)
Physical Therapy Treatment Patient Details Name: Julie Mora MRN: [id number] DOB: Jun 22, 1941 Today's Date: 06/11/2017    History of Present Illness 76 y.o. female with medical history significant of hypertension, hyperlipidemia, diabetes mellitus, stroke, DVT on Pradaxa, CHF, who presents with nausea, vomiting, abdominal pain and increased urinary frequency. Dx of gallstone pancreatitis, UTI.  Pt s/p MRCP 9/20 and s/p laparoscopic cholecystectomy on 9/21    PT Comments    Assisted OOB to [facility] then amb in hallway.    Follow Up Recommendations  Home health PT;Supervision - Intermittent     Equipment Recommendations  None recommended by PT    Recommendations for Other Services       Precautions / Restrictions Precautions Precautions: Fall Restrictions Weight Bearing Restrictions: No    Mobility  Bed Mobility Overal bed mobility: Needs Assistance Bed Mobility: Supine to Sit     Supine to sit: Mod assist     General bed mobility comments: pt utilized bed rail and HHA to self assist upright but needed Mod Assist with bed pad to complete scooting to EOB  Transfers Overall transfer level: Needs assistance Equipment used: None Transfers: Sit to/from [facility] Sit to Stand: Supervision;Min guard Stand pivot transfers: Supervision;Min guard       General transfer comment: "I like rails" pt was able to stand holding to secure BSC rail and a bed rail.   1/4 turn from bed to [facility] with increased time.   Ambulation/Gait Ambulation/Gait assistance: Min guard;Supervision Ambulation Distance (Feet): 55 Feet Assistive device: Rolling walker (2 wheeled) Gait Pattern/deviations: Step-through pattern;Decreased stride length Gait velocity: decreased   General Gait Details: used rollator "thats what I use at home" with forward flex posture and 2 standing rest breaks.     Stairs            Wheelchair Mobility    Modified Rankin (Stroke Patients Only)        Balance                                            Cognition Arousal/Alertness: Awake/alert Behavior During Therapy: WFL for tasks assessed/performed Overall Cognitive Status: Within Functional Limits for tasks assessed                                        Exercises      General Comments        Pertinent Vitals/Pain Pain Assessment: Faces Faces Pain Scale: Hurts a little bit Pain Location: surgical site Pain Descriptors / Indicators: Aching;Sore Pain Intervention(s): Monitored during session;Repositioned    Home Living                      Prior Function            PT Goals (current goals can now be found in the care plan section) Progress towards PT goals: Progressing toward goals    Frequency    Min 3X/week      PT Plan Current plan remains appropriate    Co-evaluation              AM-PAC PT "6 Clicks" Daily Activity  Outcome Measure    Difficulty moving from lying on back to sitting on the side of the bed? : Unable Difficulty sitting down  on and standing up from a chair with arms (e.g., wheelchair, bedside commode, etc,.)?: Unable Help needed moving to and from a bed to chair (including a wheelchair)?: A Little Help needed walking in hospital room?: A Little Help needed climbing 3-5 steps with a railing? : A Lot 6 Click Score: 10    End of Session Equipment Utilized During Treatment: Gait belt Activity Tolerance: Patient tolerated treatment well Patient left: in chair;with call bell/phone within reach;with family/visitor present;with nursing/sitter in room Nurse Communication: Mobility status PT Visit Diagnosis: Difficulty in walking, not elsewhere classified (R26.2)     Time: 1425-1450 PT Time Calculation (min) (ACUTE ONLY): 25 min  Charges:  $Gait Training: 8-22 mins $Therapeutic Activity: 8-22 mins                    G Codes:       Rica Koyanagi  PTA [facility]  Acute  Rehab Pager       [phone]

## 2017-06-11 NOTE — Progress Notes (Signed)
Initial Nutrition Assessment  DOCUMENTATION CODES:   Morbid obesity  INTERVENTION:  - Promoted adequate intake of calories and protein to aid in healing post op and increasing PO intake - Premier protein BID, each supplement provides 160 calories and 30 grams protein  NUTRITION DIAGNOSIS:   Inadequate oral intake related to nausea, poor appetite as evidenced by per patient/family report.  GOAL:   Patient will meet greater than or equal to 90% of their needs  MONITOR:   PO intake, Supplement acceptance, Labs, I & O's, Weight trends  REASON FOR ASSESSMENT:   Low Braden    ASSESSMENT:   Pt with PMH of HTN, HLD, DM, CHF presented with N/V, abdominal pain and increased urinary frequency found acute pancreatitis. Pt is s/p laparoscopic cholecystectomy on 09/21.  Pt confused and unable to elaborate on all RD questions at time of visit, stating "I am not thinking very clearly this morning."  Pt reports poor po intake PTA, stating "I couldn't keep anything down". Per chart review pt had experienced N/V PTA, vomiting 2-3 times per day. Pt reports an increase in appetite adter surgery, reports consuming ~50% of meals. Per chart pt has consumed 0-80% of meals this admission.  Pt reports consuming 2 Ensures/day PTA.   Pt unsure of weight status however states a UBW of ~215 lbs. Per chart review pt's weight has been stable ~247 lbs over the past 3 months.   Pt net positive 3 L since admission.   Labs reviewed; CBG 78-234 Medications reviewed; vitamin D, vitamin C, ferrous gluconate, sliding scale insulin, Lantus, multivitamin  Nutrition focused physical exam completed. Findings include no fat depletion, no muscle depletion and mild edema.   Diet Order:  Diet Heart Room service appropriate? Yes; Fluid consistency: Thin  Skin:  Wound (see comment) (Incision at abdomen)  Last BM:  06/07/17  Height:   Ht Readings from Last 1 Encounters:  06/07/17 5\' 4"  (1.626 m)    Weight:    Wt Readings from Last 1 Encounters:  06/07/17 247 lb 9.2 oz (112.3 kg)    Ideal Body Weight:  54.5 kg  BMI:  Body mass index is 42.5 kg/m.  Estimated Nutritional Needs:   Kcal:  2021-2240  Protein:  120-130 grams  Fluid:  >/= 2 L/d  EDUCATION NEEDS:   Education needs no appropriate at this time  Parks Ranger, MS, RDN, LDN 06/11/2017 12:04 PM

## 2017-06-11 NOTE — Progress Notes (Signed)
PROGRESS NOTE  Julie Mora OAC:[id number] DOB: 1940-09-25 DOA: 06/06/2017 PCP: Elby Showers, MD   LOS: 4 days   Brief Narrative / Interim history: pleasant 76 year old female with history of hypertension, hyperlipidemia, diabetes, prior CVA, DVT on chronic anticoagulation who presented to the emergency room with abdominal pain, nausea vomiting.  She was found to have acute pancreatitis as well as cholelithiasis with a slightly dilated CBD.  Assessment & Plan: Principal Problem:   Gallstone pancreatitis Active Problems:   HYPERCHOLESTEROLEMIA   Essential hypertension   History of deep venous thrombosis   Sepsis (HCC)   Pancreatic cyst   Hyperkalemia   Abnormal LFTs   UTI (urinary tract infection)   Choledocholithiasis   Chronic diastolic CHF (congestive heart failure) (HCC)   Gallstone pancreatitis -Supportive treatment, IV fluids, pain control. SIRS likely due to pancreatitis rather than infection. -Improving, she underwent an MRCP on 9/20 which was negative for stone in CBD -Patient is status post laparoscopic cholecystectomy on 9/21, recovering well postop, has some pain which appears to be controlled with Tylenol alone -Liver enzymes are improving, bilirubin has now normalized. -febrile on 9/23, chest x-ray without infiltrates however did show slight fluid overload.  -Fever has resolved today, and with cultures remaining negative will discontinue all antibiotics.  Possible postop fever due to atelectasis  UTI -patient placed on Zosyn on admission, and her antibiotics were narrowed to ceftriaxone -Cultures have remained negative, discontinue antibiotics today  Hypertension -Hydralazine as needed, Lasix today, torsemide tomorrow  History of DVT -Resume Pradaxa today  Chronic diastolic CHF -Stable, she looked a little bit more fluid overloaded 9/22, received IV Lasix 1, repeat Lasix IV today on 9/24 as well given the chest x-ray yesterday showed a little bit of  vascular congestion as well as pleural effusion.  Her oxygen saturation is on the lower side of normal at 90% on room air -Continue torsemide starting tomorrow   DVT prophylaxis: Per general surgery Code Status: Full code Family Communication: No family at bedside Disposition Plan: Home when ready stop PT evaluation pending, however patient tells me that she will not go to an SNF  Consultants:   Gastroenterology  General surgery  Procedures:   none  Antimicrobials:  Zosyn 9/20 >> 9/22  Ceftriaxone 9/22 >> 9/24  Subjective: -She feels well, denies any shortness of breath or chest pain.  Denies any abdominal pain, nausea or vomiting  Objective: Vitals:   06/10/17 0641 06/10/17 1220 06/10/17 2250 06/11/17 0633  BP:  114/66 114/67 115/87  Pulse:  (!) 112 100 (!) 105  Resp:  20 16 18   Temp: 100.3 F (37.9 C) 98.9 F (37.2 C) 99.9 F (37.7 C) 99 F (37.2 C)  TempSrc:  Oral Oral Oral  SpO2:  95% 90% 90%  Weight:      Height:        Intake/Output Summary (Last 24 hours) at 06/11/17 1044 Last data filed at 06/11/17 0634  Gross per 24 hour  Intake              169 ml  Output              425 ml  Net             -256 ml   Filed Weights   06/07/17 0434  Weight: 112.3 kg (247 lb 9.2 oz)    Examination:  Constitutional: NAD, calm, comfortable Eyes: PERRL, lids and conjunctivae normal Respiratory: clear to auscultation bilaterally, no wheezing, no crackles. Normal respiratory  effort.  Cardiovascular: Regular rate and rhythm, no murmurs / rubs / gallops. 1+ LE edema.  Abdomen: no tenderness. Bowel sounds positive.  Skin: Chronic venous stasis changes bilateral lower extremities Neurologic: non focal  Data Reviewed: I have independently reviewed following labs and imaging studies   CBC:  Recent Labs Lab 06/07/17 0552 06/08/17 0435 06/09/17 0523 06/10/17 0549 06/11/17 0536  WBC 16.6* 18.5* 12.3* 12.1* 10.7*  HGB 14.5 13.6 12.8 13.1 12.5  HCT 43.7 41.5  37.5 40.3 38.4  MCV 98.0 96.7 95.9 98.8 98.7  PLT 170 143* 135* 181 623   Basic Metabolic Panel:  Recent Labs Lab 06/06/17 2320 06/07/17 0552 06/08/17 0435 06/09/17 0523  NA 137 141 138 138  K 5.2* 4.2 3.1* 3.9  CL 104 107 107 110  CO2 21* 24 23 22   GLUCOSE 330* 279* 187* 221*  BUN 25* 24* 20 19  CREATININE 0.78 0.89 0.90 0.81  CALCIUM 9.2 8.6* 7.9* 7.3*   GFR: Estimated Creatinine Clearance: 73.6 mL/min (by C-G formula based on SCr of 0.81 mg/dL). Liver Function Tests:  Recent Labs Lab 06/06/17 2320 06/07/17 0552 06/08/17 0435 06/09/17 0523  AST 355* 221* 73* 82*  ALT 438* 363* 216* 151*  ALKPHOS 153* 146* 121 100  BILITOT 4.6* 2.4* 1.1 0.7  PROT 7.0 6.5 5.8* 5.3*  ALBUMIN 3.4* 3.1* 2.6* 2.2*    Recent Labs Lab 06/06/17 2320 06/08/17 0435  LIPASE 343* 27   No results for input(s): AMMONIA in the last 168 hours. Coagulation Profile:  Recent Labs Lab 06/06/17 2320  INR 1.14   Cardiac Enzymes: No results for input(s): CKTOTAL, CKMB, CKMBINDEX, TROPONINI in the last 168 hours. BNP (last 3 results) No results for input(s): PROBNP in the last 8760 hours. HbA1C: No results for input(s): HGBA1C in the last 72 hours. CBG:  Recent Labs Lab 06/10/17 0742 06/10/17 1404 06/10/17 1825 06/10/17 2249 06/11/17 0751  GLUCAP 123* 234* 137* 147* 78   Lipid Profile: No results for input(s): CHOL, HDL, LDLCALC, TRIG, CHOLHDL, LDLDIRECT in the last 72 hours. Thyroid Function Tests: No results for input(s): TSH, T4TOTAL, FREET4, T3FREE, THYROIDAB in the last 72 hours. Anemia Panel: No results for input(s): VITAMINB12, FOLATE, FERRITIN, TIBC, IRON, RETICCTPCT in the last 72 hours. Urine analysis:    Component Value Date/Time   COLORURINE YELLOW 06/07/2017 0234   APPEARANCEUR HAZY (A) 06/07/2017 0234   LABSPEC 1.021 06/07/2017 0234   PHURINE 6.0 06/07/2017 0234   GLUCOSEU >=500 (A) 06/07/2017 0234   HGBUR SMALL (A) 06/07/2017 0234   BILIRUBINUR NEGATIVE  06/07/2017 0234   BILIRUBINUR negative 04/23/2017 1205   KETONESUR 5 (A) 06/07/2017 0234   PROTEINUR NEGATIVE 06/07/2017 0234   UROBILINOGEN 0.2 04/23/2017 1205   UROBILINOGEN 0.2 12/11/2010 1434   NITRITE POSITIVE (A) 06/07/2017 0234   LEUKOCYTESUR LARGE (A) 06/07/2017 0234   Sepsis Labs: Invalid input(s): PROCALCITONIN, LACTICIDVEN  Recent Results (from the past 240 hour(s))  Urine culture     Status: Abnormal   Collection Time: 06/07/17  2:34 AM  Result Value Ref Range Status   Specimen Description URINE, RANDOM  Final   Special Requests NONE  Final   Culture MULTIPLE SPECIES PRESENT, SUGGEST RECOLLECTION (A)  Final   Report Status 06/08/2017 FINAL  Final  Culture, blood (Routine X 2) w Reflex to ID Panel     Status: None (Preliminary result)   Collection Time: 06/07/17  5:52 AM  Result Value Ref Range Status   Specimen Description BLOOD RIGHT ANTECUBITAL  Final   Special Requests AEROBIC BOTTLE ONLY Blood Culture adequate volume  Final   Culture   Final    NO GROWTH 3 DAYS Performed at [facility], [address] [place], [place] [address]    Report Status PENDING  Incomplete  Culture, blood (Routine X 2) w Reflex to ID Panel     Status: None (Preliminary result)   Collection Time: 06/07/17  5:52 AM  Result Value Ref Range Status   Specimen Description BLOOD RIGHT HAND  Final   Special Requests AEROBIC BOTTLE ONLY Blood Culture adequate volume  Final   Culture   Final    NO GROWTH 3 DAYS Performed at [facility], [place] [address] [place], [place] [address]    Report Status PENDING  Incomplete  Surgical pcr screen     Status: Abnormal   Collection Time: 06/08/17  5:37 AM  Result Value Ref Range Status   MRSA, PCR NEGATIVE NEGATIVE Final   Staphylococcus aureus POSITIVE (A) NEGATIVE Final    Comment: (NOTE) The Xpert SA Assay (FDA approved for NASAL specimens in patients 55 years of age and older), is one component of a comprehensive surveillance  program. It is not intended to diagnose infection nor to guide or monitor treatment.       Radiology Studies: Dg Chest 2 View  Result Date: 06/10/2017 CLINICAL DATA:  Recent cholecystectomy.  Fever EXAM: CHEST  2 VIEW COMPARISON:  MRI 9/26 18, chest radiograph 01/19/2017 FINDINGS: Stable cardiac silhouette. Low lung volumes. Mild basilar atelectasis. Mild central venous congestion. Small effusions noted on lateral projection IMPRESSION: 1. Low lung volumes, central venous congestion, and small effusions. 2. No clear evidence pneumonia Electronically Signed   By: Suzy Bouchard M.D.   On: 06/10/2017 13:33    Scheduled Meds: . acidophilus   Oral Daily  . Chlorhexidine Gluconate Cloth  6 each Topical Daily  . cholecalciferol  2,000 Units Oral Daily  . ferrous gluconate  324 mg Oral Q breakfast  . gabapentin  300 mg Oral QHS  . insulin aspart  0-9 Units Subcutaneous TID WC  . insulin glargine  10 Units Subcutaneous Daily  . insulin glargine  25 Units Subcutaneous QHS  . multivitamin with minerals  1 tablet Oral Daily  . mupirocin ointment  1 application Nasal BID  . nystatin cream  1 application Topical BID  . polyvinyl alcohol  1 drop Both Eyes TID  . prednisoLONE acetate  1 drop Both Eyes BID  . [START ON 06/12/2017] torsemide  20 mg Oral Daily  . vitamin C   Oral Daily   Continuous Infusions: . cefTRIAXone (ROCEPHIN)  IV Stopped (06/10/17 2000)  . heparin 1,700 Units/hr (06/11/17 0254)    Marzetta Board, MD, PhD [facility] Pager [phone]  If 7PM-7AM, please contact night-coverage [email or web address] Password [facility] 06/11/2017, 10:44 AM

## 2017-06-11 NOTE — Care Management Important Message (Signed)
Important Message  Patient Details  Name: Nayali Talerico MRN: [id number] Date of Birth: [birth year]   Medicare Important Message Given:  Yes    Kerin Salen 06/11/2017, 12:26 [place] Message  Patient Details  Name: Rickie Gutierres MRN: [id number] Date of Birth: 1940/09/25   Medicare Important Message Given:  Yes    Kerin Salen 06/11/2017, 12:25 PM

## 2017-06-11 NOTE — Anesthesia Postprocedure Evaluation (Signed)
Anesthesia Post Note  Patient: Julie Mora  Procedure(s) Performed: Procedure(s) (LRB): LAPAROSCOPIC CHOLECYSTECTOMY (N/A)     Patient location during evaluation: PACU Anesthesia Type: General Level of consciousness: awake and alert Pain management: pain level controlled Vital Signs Assessment: post-procedure vital signs reviewed and stable Respiratory status: spontaneous breathing, nonlabored ventilation and respiratory function stable Cardiovascular status: blood pressure returned to baseline and stable Postop Assessment: no apparent nausea or vomiting Anesthetic complications: no    Last Vitals:  Vitals:   06/10/17 2250 06/11/17 0633  BP: 114/67 115/87  Pulse: 100 (!) 105  Resp: 16 18  Temp: 37.7 C 37.2 C  SpO2: 90% 90%    Last Pain:  Vitals:   06/11/17 0633  TempSrc: Oral  PainSc:    Pain Goal: Patients Stated Pain Goal: 3 (06/09/17 2304)               Catalina Gravel

## 2017-06-12 LAB — BASIC METABOLIC PANEL
ANION GAP: 8 (ref 5–15)
BUN: 21 mg/dL — ABNORMAL HIGH (ref 6–20)
CALCIUM: 8.6 mg/dL — AB (ref 8.9–10.3)
CO2: 27 mmol/L (ref 22–32)
Chloride: 105 mmol/L (ref 101–111)
Creatinine, Ser: 0.88 mg/dL (ref 0.44–1.00)
Glucose, Bld: 168 mg/dL — ABNORMAL HIGH (ref 65–99)
Potassium: 3.3 mmol/L — ABNORMAL LOW (ref 3.5–5.1)
SODIUM: 140 mmol/L (ref 135–145)

## 2017-06-12 LAB — CULTURE, BLOOD (ROUTINE X 2)
CULTURE: NO GROWTH
Culture: NO GROWTH
Special Requests: ADEQUATE
Special Requests: ADEQUATE

## 2017-06-12 LAB — GLUCOSE, CAPILLARY
GLUCOSE-CAPILLARY: 157 mg/dL — AB (ref 65–99)
GLUCOSE-CAPILLARY: 279 mg/dL — AB (ref 65–99)
GLUCOSE-CAPILLARY: 289 mg/dL — AB (ref 65–99)

## 2017-06-12 MED ORDER — POTASSIUM CHLORIDE CRYS ER 20 MEQ PO TBCR
40.0000 meq | EXTENDED_RELEASE_TABLET | Freq: Once | ORAL | Status: AC
  Administered 2017-06-12: 40 meq via ORAL
  Filled 2017-06-12: qty 2

## 2017-06-12 MED ORDER — HYDROCODONE-ACETAMINOPHEN 5-325 MG PO TABS: 1.0000 | ORAL_TABLET | ORAL | 0 refills | Status: DC | PRN

## 2017-06-12 NOTE — Progress Notes (Signed)
Pt was alert and oriented. D/C instructions were given and pt was d/cd home with home health to follow up.

## 2017-06-12 NOTE — Discharge Summary (Signed)
Physician Discharge Summary  Julie Mora JEH:[id number] DOB: [birth year] DOA: 06/06/2017  PCP: Elby Showers, MD  Admit date: 06/06/2017 Discharge date: 06/12/2017  Admitted From: home Disposition:  home  Recommendations for Outpatient Follow-up:  1. Follow up with [facility] in 1-2 weeks  Home Health: PT Equipment/Devices: none  Discharge Condition: stable CODE STATUS: Full code Diet recommendation: heart healthy  HPI: Julie Mora is a 76 y.o. female with medical history significant of hypertension, hyperlipidemia, diabetes mellitus, stroke, DVT on Pradaxa, CHF, who presents with nausea, vomiting, abdominal pain and increased urinary frequency. Patient states that she has been having nausea, vomiting and abdominal pain for almost a week, which has worsened in the past 3 days. Her abdominal pain is located in the lower abdomen, constant, 5 out of 10 in severity, sharp, nonradiating. She vomited 2-3 times each day. Patient has chills, no fever, no diarrhea. Patient denies chest pain, SOB, cough, unilateral weakness. She reports increased urinary frequency, but no dysuria or burning on urination.  Hospital Course: Discharge Diagnoses:  Principal Problem:   Gallstone pancreatitis Active Problems:   HYPERCHOLESTEROLEMIA   Essential hypertension   History of deep venous thrombosis   Sepsis (HCC)   Pancreatic cyst   Hyperkalemia   Abnormal LFTs   UTI (urinary tract infection)   Choledocholithiasis   Chronic diastolic CHF (congestive heart failure) (HCC)  Gallstone pancreatitis -patient was admitted to the hospital with acute pancreatitis due to gallstones.  She was initially given supportive treatment with IV fluids and pain control.  Gastroenterology and general surgery were consulted.  She underwent an MRCP on 9/20 which was negative for stone in CBD.  She was eventually taken to the operating room on 9/21 and underwent laparoscopic cholecystectomy.  She recovered well  postoperatively, her liver enzymes have improved.  She was slightly fluid overloaded following the surgery requiring IV diuresis for 2 days.  Her volume status has returned to normal. Sepsis has been ruled out, she had SIRS but this is likely due to pancreatitis.  She was placed on antibiotics empirically, cultures have remained negative and antibiotics have been discontinued. UTI -ruled out, cultures have remained negative Hypertension -assume home medications History of DVT -patient was maintained on heparin infusion perioperatively, her Pradaxa was resumed and her hemoglobin has remained stable and there is no evidence for bleed Chronic diastolic CHF -Stable overall, without significant fluid overload however did require IV diuresis 2 days.  Continue torsemide on discharge   Discharge Instructions   Allergies as of 06/12/2017      Reactions   Azathioprine Shortness Of Breath   Cellcept [mycophenolate Mofetil] Nausea Only   severe nausea   Ramipril Cough      Medication List    STOP taking these medications   amoxicillin-clavulanate 500-125 MG tablet Commonly known as:  AUGMENTIN   ciprofloxacin 250 MG tablet Commonly known as:  CIPRO     TAKE these medications   ACCU-CHEK AVIVA PLUS test strip Generic drug:  glucose blood USE TO CHECK BLOOD SUGAR BEFORE BREAKFAST AND SUPPER   acetaminophen 500 MG tablet Commonly known as:  TYLENOL Take 500 mg by mouth every 6 (six) hours as needed for pain.   alendronate 70 MG tablet Commonly known as:  FOSAMAX TAKE 1 TABLET(70 MG) BY MOUTH EVERY 7 DAYS WITH A FULL GLASS OF WATER AND ON AN EMPTY STOMACH   BD PEN NEEDLE NANO U/F 32G X 4 MM Misc Generic drug:  Insulin Pen Needle USE AS DIRECTED  CALCIUM ASCORBATE PO Take 1 tablet by mouth daily.   CULTURELLE PO Take 1 capsule by mouth daily.   ferrous gluconate 325 MG tablet Commonly known as:  FERGON Take 1 tablet (325 mg total) by mouth daily with breakfast.   ferrous  gluconate 324 MG tablet Commonly known as:  FERGON Take 1 tablet (324 mg total) by mouth daily with breakfast.   gabapentin 300 MG capsule Commonly known as:  NEURONTIN take 1 capsule at bedtime   HYDROcodone-acetaminophen 5-325 MG tablet Commonly known as:  NORCO/VICODIN Take 1 tablet by mouth every 4 (four) hours as needed for moderate pain.   insulin aspart 100 UNIT/ML injection Commonly known as:  novoLOG Inject 5-12 Units into the skin 2 (two) times daily as needed for high blood sugar. What changed:  how much to take  when to take this   Insulin Glargine 100 UNIT/ML Solostar Pen Commonly known as:  LANTUS SOLOSTAR Inject 35 units in skin at bedtime What changed:  how much to take  how to take this  when to take this  additional instructions   multivitamin per tablet Take 1 tablet by mouth daily.   nystatin cream Commonly known as:  MYCOSTATIN Apply 1 application topically 2 (two) times daily.   potassium chloride 10 MEQ tablet Commonly known as:  K-DUR Take 1 tablet (10 mEq total) by mouth daily.   PRADAXA 75 MG Caps capsule Generic drug:  dabigatran TAKE 1 CAPSULE(75 MG) BY MOUTH TWICE DAILY   prednisoLONE acetate 1 % ophthalmic suspension Commonly known as:  PRED FORTE Place 1 drop into both eyes 2 (two) times daily.   simvastatin 20 MG tablet Commonly known as:  ZOCOR TAKE 1 TABLET BY MOUTH EVERY NIGHT AT BEDTIME   SYSTANE 0.4-0.3 % Soln Generic drug:  Polyethyl Glycol-Propyl Glycol Apply 1 drop to eye 3 (three) times daily. Both eyes, as needed   torsemide 20 MG tablet Commonly known as:  DEMADEX Take 1 tablet (20 mg total) by mouth daily.   Vitamin D 2000 units tablet Take 2,000 Units by mouth daily.            Discharge Care Instructions        Start     Ordered   06/12/17 0000  HYDROcodone-acetaminophen (NORCO/VICODIN) 5-325 MG tablet  Every 4 hours PRN     06/12/17 0911     Follow-up [facility] Go on 06/26/2017.   Specialty:  General Surgery Why:  Your appointment is 06/26/17 at 9:45AM. Please arrive 30 minutes prior to your appointment to check in and fill out paperwork. Contact information: [address] [place] [phone]       Elby Showers, MD. Schedule an appointment as soon as possible for a visit in 2 week(s).   Specialty:  Internal Medicine Contact information: [address] [address] [phone]        [facility].   Specialty:  [place] Why:  physical therapy Contact information: [place] [address] [phone]           Consultations:  General surgery  Gastroenterology  Procedures/Studies:  Laparoscopic cholecystectomy 9/21  Dg Chest 2 View  Result Date: 06/10/2017 CLINICAL DATA:  Recent cholecystectomy.  Fever EXAM: CHEST  2 VIEW COMPARISON:  MRI 9/26 18, chest radiograph 01/19/2017 FINDINGS: Stable cardiac silhouette. Low lung volumes. Mild basilar atelectasis. Mild  central venous congestion. Small effusions noted on lateral projection IMPRESSION: 1. Low lung volumes, central venous congestion, and small effusions. 2. No clear evidence pneumonia Electronically Signed   By: Suzy Bouchard M.D.   On: 06/10/2017 13:33   US Abdomen Complete  Result Date: 06/07/2017 CLINICAL DATA:  Elevated LFT EXAM: ABDOMEN ULTRASOUND COMPLETE COMPARISON:  CT 06/07/2017 FINDINGS: Gallbladder: Multiple shadowing stones, measuring up to 1.3 cm. Negative sonographic Murphy. Slightly thickened wall up to 6.9 mm. Common bile duct: Diameter: Enlarged, measuring up to 8.8 mm Liver: Slight increased echogenicity. No focal hepatic abnormality. Portal vein is patent on color Doppler imaging with normal direction of blood flow towards the liver. IVC: No abnormality visualized. Pancreas: Poorly visualized. Spleen: Size and appearance within  normal limits. Right Kidney: Length: 12.1 cm. Cortical thinning. No hydronephrosis. Left Kidney: Length: 13.1 cm. Cortical thinning. No hydronephrosis. Septated cyst in the mid to lower pole measuring 6.1 x 4.2 x 4.7 cm. Abdominal aorta: Proximal aorta not seen. Mid and distal aortic segments are non dilated. Other findings: None. IMPRESSION: 1. Cholelithiasis with mild wall thickening but negative sonographic Murphy. Findings are nonspecific. Follow-up hepatobiliary nuclear medicine imaging could be performed if concern for an acute cholecystitis. 2. Slightly enlarged common bile duct up to 8.8 mm. If laboratory values suggest obstruction, further evaluation with MRCP could be considered 3. Slightly echogenic liver consistent with fatty infiltration 4. Cortical atrophy of the kidneys. 6.1 cm septated cyst in the mid to lower left kidney. Electronically Signed   By: Donavan Foil M.D.   On: 06/07/2017 02:29   Ct Abdomen Pelvis W Contrast  Result Date: 06/07/2017 CLINICAL DATA:  Abdominal pain with nausea and vomiting for 3 days, elevated white count and lipase EXAM: CT ABDOMEN AND PELVIS WITH CONTRAST TECHNIQUE: Multidetector CT imaging of the abdomen and pelvis was performed using the standard protocol following bolus administration of intravenous contrast. CONTRAST:  80 mL Isovue 300 intravenous COMPARISON:  MRI 05/01/2017, CT 07/23/2009 FINDINGS: Lower chest: Lung bases demonstrate no acute consolidation or pleural effusion. Mitral calcifications. Normal heart size. Mild circumferential thickening of the distal esophagus. Hepatobiliary: Multiple calcified gallstones. No focal hepatic abnormality or biliary dilatation Pancreas: Mild diffuse edema around the pancreas consistent with acute pancreatitis. No organizing fluid collections. A small cystic lesion at the inferior pancreatic head measures 17 mm and was noted on comparison MRI. Lesions in the pancreatic tail better seen on MRI. Spleen: Normal in size  without focal abnormality. Adrenals/Urinary Tract: Adrenal glands are normal. Subcentimeter hypodense lesions within both kidneys. Punctate nonobstructing stone mid right kidney. 5.3 cm septated cyst in the mid to lower left kidney re- demonstrated. Bladder normal. Stomach/Bowel: The stomach is nonenlarged. No dilated small bowel. No colon wall thickening. Vascular/Lymphatic: Aortic atherosclerosis. No enlarged abdominal or pelvic lymph nodes. Reproductive: Uterus and bilateral adnexa are unremarkable. Other: Negative for free air or free fluid. Moderate fat containing periumbilical hernia with small amount of fluid and stranding in the hernia sac. Musculoskeletal: Advanced degenerative changes of the spine with marked endplate changes at T0-G2. Grade 1 anterolisthesis of L4 on L5. IMPRESSION: 1. Mild edema and diffuse hazy infiltration surrounding the pancreas, consistent with acute pancreatitis. No organizing fluid collections. Stable 17 mm cystic lesion at the inferior head of the pancreas. 2. Gallstones 3. Stable septated cyst within the mid to lower pole of the left kidney. Nonobstructing stone in the right kidney. 4. Circumferential wall thickening of the distal esophagus may reflect an esophagitis. 5. Moderate fat containing  periumbilical hernia with some fluid and edema in the hernia sac. Electronically Signed   By: Donavan Foil M.D.   On: 06/07/2017 02:24   Mr 3d Recon At Scanner  Result Date: 06/07/2017 CLINICAL DATA:  Elevated white count.  Vomiting.  Elevated lipase. EXAM: MRI ABDOMEN WITHOUT AND WITH CONTRAST (INCLUDING MRCP) TECHNIQUE: Multiplanar multisequence MR imaging of the abdomen was performed both before and after the administration of intravenous contrast. Heavily T2-weighted images of the biliary and pancreatic ducts were obtained, and three-dimensional MRCP images were rendered by post processing. CONTRAST:  76mL MULTIHANCE GADOBENATE DIMEGLUMINE 529 MG/ML IV SOLN COMPARISON:  CT  06/07/2017, MRI 05/01/2017, 03/20/16 FINDINGS: Lower chest:  Lung bases are clear. Hepatobiliary: No intrahepatic duct dilatation. No focal of hepatic lesion. There multiple small gallstones within the lumen of the gallbladder (approximately 15 to 020) stones. Stones measure approximate 3 - 5 mm each. There is no dilatation of the common bile duct. No filling defect within the common bile duct. Pancreas: No the pancreas is mildly edematous within small amount of fluid along the head body and tail of the pancreas. No organized fluid collections. The pancreatic duct is not dilated. Duct is contiguous There is some motion artifact degrades the imaging but the pancreatic duct appears . There is a lobular cystic lesion in the uncinate of the pancreas with a cluster of cysts measuring approximately 9-10 mm each in total measuring approximately 22 mm (image 64, series 40). Several smaller cysts in the neck region the gallbladder additionally. The cysts do not appear to communicate with the pancreatic duct. Cysts are similar to comparison MRI exams On the post-contrast examination is the pancreatic parenchyma appears to enhance uniformly. There is motion degradation there is motion degradation on the post-contrast T1 weighted imaging from respiratory motion Spleen: Normal Adrenals/urinary tract: Adrenal glands and kidneys are normal. Large benign appearing cyst in the LEFT renal hilum. This is described as Bosniak 4F cytic lesion on comparison MRI (without respiratory motion). Recommend followup her prior MRI recommendation. Stomach/Bowel: Stomach and limited of the small bowel is unremarkable Vascular/Lymphatic: No vascular complication cyst hepatic bursitis for Musculoskeletal: No aggressive osseous lesion. IMPRESSION: 1. Edematous pancreas with peripancreatic fluid consistent acute pancreatitis. No evidence of pancreatic necrosis. No organized fluid collections. No variant pancreatic ductal anatomy. 2. Several small cystic  lesions in the head of pancreas likely relate to chronic inflammation or indolent cystic neoplasms. No change from prior. 3. Multiple gallstones within the lumen gallbladder. No common bile duct stone or biliary obstruction. Electronically Signed   By: Suzy Bouchard M.D.   On: 06/07/2017 13:47   Mr Abdomen Mrcp Moise Boring Contast  Result Date: 06/07/2017 CLINICAL DATA:  Elevated white count.  Vomiting.  Elevated lipase. EXAM: MRI ABDOMEN WITHOUT AND WITH CONTRAST (INCLUDING MRCP) TECHNIQUE: Multiplanar multisequence MR imaging of the abdomen was performed both before and after the administration of intravenous contrast. Heavily T2-weighted images of the biliary and pancreatic ducts were obtained, and three-dimensional MRCP images were rendered by post processing. CONTRAST:  34mL MULTIHANCE GADOBENATE DIMEGLUMINE 529 MG/ML IV SOLN COMPARISON:  CT 06/07/2017, MRI 05/01/2017, 03/20/16 FINDINGS: Lower chest:  Lung bases are clear. Hepatobiliary: No intrahepatic duct dilatation. No focal of hepatic lesion. There multiple small gallstones within the lumen of the gallbladder (approximately 15 to 020) stones. Stones measure approximate 3 - 5 mm each. There is no dilatation of the common bile duct. No filling defect within the common bile duct. Pancreas: No the pancreas is mildly edematous  within small amount of fluid along the head body and tail of the pancreas. No organized fluid collections. The pancreatic duct is not dilated. Duct is contiguous There is some motion artifact degrades the imaging but the pancreatic duct appears . There is a lobular cystic lesion in the uncinate of the pancreas with a cluster of cysts measuring approximately 9-10 mm each in total measuring approximately 22 mm (image 64, series 40). Several smaller cysts in the neck region the gallbladder additionally. The cysts do not appear to communicate with the pancreatic duct. Cysts are similar to comparison MRI exams On the post-contrast examination is  the pancreatic parenchyma appears to enhance uniformly. There is motion degradation there is motion degradation on the post-contrast T1 weighted imaging from respiratory motion Spleen: Normal Adrenals/urinary tract: Adrenal glands and kidneys are normal. Large benign appearing cyst in the LEFT renal hilum. This is described as Bosniak 32F cytic lesion on comparison MRI (without respiratory motion). Recommend followup her prior MRI recommendation. Stomach/Bowel: Stomach and limited of the small bowel is unremarkable Vascular/Lymphatic: No vascular complication cyst hepatic bursitis for Musculoskeletal: No aggressive osseous lesion. IMPRESSION: 1. Edematous pancreas with peripancreatic fluid consistent acute pancreatitis. No evidence of pancreatic necrosis. No organized fluid collections. No variant pancreatic ductal anatomy. 2. Several small cystic lesions in the head of pancreas likely relate to chronic inflammation or indolent cystic neoplasms. No change from prior. 3. Multiple gallstones within the lumen gallbladder. No common bile duct stone or biliary obstruction. Electronically Signed   By: Suzy Bouchard M.D.   On: 06/07/2017 13:47      Subjective: - no chest pain, shortness of breath, no abdominal pain, nausea or vomiting.   Discharge Exam: Vitals:   06/12/17 0424 06/12/17 1348  BP: 112/65 123/72  Pulse: 90 86  Resp: 16 16  Temp: 98.7 F (37.1 C) 98.6 F (37 C)  SpO2: 93% 95%    General: Pt is alert, awake, not in acute distress Cardiovascular: RRR, S1/S2 +, no rubs, no gallops Respiratory: CTA bilaterally, no wheezing, no rhonchi Abdominal: Soft, NT, ND, bowel sounds + Extremities: no edema, no cyanosis    The results of significant diagnostics from this hospitalization (including imaging, microbiology, ancillary and laboratory) are listed below for reference.     Microbiology: Recent Results (from the past 240 hour(s))  Urine culture     Status: Abnormal   Collection Time:  06/07/17  2:34 AM  Result Value Ref Range Status   Specimen Description URINE, RANDOM  Final   Special Requests NONE  Final   Culture MULTIPLE SPECIES PRESENT, SUGGEST RECOLLECTION (A)  Final   Report Status 06/08/2017 FINAL  Final  Culture, blood (Routine X 2) w Reflex to ID Panel     Status: None (Preliminary result)   Collection Time: 06/07/17  5:52 AM  Result Value Ref Range Status   Specimen Description BLOOD RIGHT ANTECUBITAL  Final   Special Requests AEROBIC BOTTLE ONLY Blood Culture adequate volume  Final   Culture   Final    NO GROWTH 4 DAYS Performed at [facility], [address], [place], [place] [address]    Report Status PENDING  Incomplete  Culture, blood (Routine X 2) w Reflex to ID Panel     Status: None (Preliminary result)   Collection Time: 06/07/17  5:52 AM  Result Value Ref Range Status   Specimen Description BLOOD RIGHT HAND  Final   Special Requests AEROBIC BOTTLE ONLY Blood Culture adequate volume  Final  Culture   Final    NO GROWTH 4 DAYS Performed at [facility], [place] [address], [place], [place] [address]    Report Status PENDING  Incomplete  Surgical pcr screen     Status: Abnormal   Collection Time: 06/08/17  5:37 AM  Result Value Ref Range Status   MRSA, PCR NEGATIVE NEGATIVE Final   Staphylococcus aureus POSITIVE (A) NEGATIVE Final    Comment: (NOTE) The Xpert SA Assay (FDA approved for NASAL specimens in patients 58 years of age and older), is one component of a comprehensive surveillance program. It is not intended to diagnose infection nor to guide or monitor treatment.      Labs: BNP (last 3 results)  Recent Labs  06/07/17 0552  BNP 60.4   Basic Metabolic Panel:  Recent Labs Lab 06/06/17 2320 06/07/17 0552 06/08/17 0435 06/09/17 0523 06/12/17 0524  NA 137 141 138 138 140  K 5.2* 4.2 3.1* 3.9 3.3*  CL 104 107 107 110 105  CO2 21* 24 23 22 27   GLUCOSE 330* 279* 187* 221* 168*  BUN 25* 24* 20 19 21*    CREATININE 0.78 0.89 0.90 0.81 0.88  CALCIUM 9.2 8.6* 7.9* 7.3* 8.6*   Liver Function Tests:  Recent Labs Lab 06/06/17 2320 06/07/17 0552 06/08/17 0435 06/09/17 0523  AST 355* 221* 73* 82*  ALT 438* 363* 216* 151*  ALKPHOS 153* 146* 121 100  BILITOT 4.6* 2.4* 1.1 0.7  PROT 7.0 6.5 5.8* 5.3*  ALBUMIN 3.4* 3.1* 2.6* 2.2*    Recent Labs Lab 06/06/17 2320 06/08/17 0435  LIPASE 343* 27   No results for input(s): AMMONIA in the last 168 hours. CBC:  Recent Labs Lab 06/07/17 0552 06/08/17 0435 06/09/17 0523 06/10/17 0549 06/11/17 0536  WBC 16.6* 18.5* 12.3* 12.1* 10.7*  HGB 14.5 13.6 12.8 13.1 12.5  HCT 43.7 41.5 37.5 40.3 38.4  MCV 98.0 96.7 95.9 98.8 98.7  PLT 170 143* 135* 181 184   Cardiac Enzymes: No results for input(s): CKTOTAL, CKMB, CKMBINDEX, TROPONINI in the last 168 hours. BNP: Invalid input(s): POCBNP CBG:  Recent Labs Lab 06/11/17 1153 06/11/17 1730 06/11/17 2137 06/12/17 0753 06/12/17 1334  GLUCAP 150* 142* 219* 157* 279*   D-Dimer No results for input(s): DDIMER in the last 72 hours. Hgb A1c No results for input(s): HGBA1C in the last 72 hours. Lipid Profile No results for input(s): CHOL, HDL, LDLCALC, TRIG, CHOLHDL, LDLDIRECT in the last 72 hours. Thyroid function studies No results for input(s): TSH, T4TOTAL, T3FREE, THYROIDAB in the last 72 hours.  Invalid input(s): FREET3 Anemia work up No results for input(s): VITAMINB12, FOLATE, FERRITIN, TIBC, IRON, RETICCTPCT in the last 72 hours. Urinalysis    Component Value Date/Time   COLORURINE YELLOW 06/11/2017 1203   APPEARANCEUR CLOUDY (A) 06/11/2017 1203   LABSPEC <1.005 (L) 06/11/2017 1203   PHURINE 6.0 06/11/2017 1203   GLUCOSEU NEGATIVE 06/11/2017 1203   HGBUR TRACE (A) 06/11/2017 1203   BILIRUBINUR NEGATIVE 06/11/2017 1203   BILIRUBINUR negative 04/23/2017 1205   KETONESUR NEGATIVE 06/11/2017 1203   PROTEINUR NEGATIVE 06/11/2017 1203   UROBILINOGEN 0.2 04/23/2017 1205    UROBILINOGEN 0.2 12/11/2010 1434   NITRITE NEGATIVE 06/11/2017 1203   LEUKOCYTESUR LARGE (A) 06/11/2017 1203   Sepsis Labs Invalid input(s): PROCALCITONIN,  WBC,  LACTICIDVEN   Time coordinating discharge: 35 minutes  SIGNED:  Marzetta Board, MD  [facility] 06/12/2017, 2:34 PM Pager [phone]  If 7PM-7AM, please contact night-coverage [email or web address] Password [email or web address]

## 2017-06-12 NOTE — Progress Notes (Signed)
Spoke with patient at bedside. Patient has used [facility] in the past and would like to use them again, agrees to [place]. [facility]ed [facility] for referral, they have accepted.  Patient is working on getting transportation home and someone to help her get her house keys from her sister. Patient states she has a "housekeeper" that comes in 3 days per week, she also runs errands, assist with meals, etc. Patient has no DME needs. [phone]

## 2017-06-15 ENCOUNTER — Telehealth: Payer: Self-pay

## 2017-06-15 NOTE — Telephone Encounter (Signed)
Kaitlyn from [facility] called and wants verbal orders for PT 1x a week for a week, 3x a week for 3 weeks, 2x a week for 5 weeks.   Direct [phone]

## 2017-06-15 NOTE — Telephone Encounter (Signed)
Julie Mora is aware

## 2017-06-15 NOTE — Telephone Encounter (Signed)
This is fine 

## 2017-06-22 ENCOUNTER — Ambulatory Visit: Payer: Medicare Other | Admitting: Internal Medicine

## 2017-06-26 ENCOUNTER — Ambulatory Visit: Payer: No Typology Code available for payment source

## 2017-06-26 ENCOUNTER — Telehealth: Payer: Self-pay | Admitting: Oncology

## 2017-06-26 ENCOUNTER — Ambulatory Visit (INDEPENDENT_AMBULATORY_CARE_PROVIDER_SITE_OTHER): Payer: Medicare Other | Admitting: Internal Medicine

## 2017-06-26 ENCOUNTER — Encounter: Payer: Self-pay | Admitting: Internal Medicine

## 2017-06-26 VITALS — BP 110/60 | HR 108 | Temp 98.7°F | Wt 239.0 lb

## 2017-06-26 DIAGNOSIS — N3941 Urge incontinence: Secondary | ICD-10-CM

## 2017-06-26 DIAGNOSIS — IMO0001 Reserved for inherently not codable concepts without codable children: Secondary | ICD-10-CM

## 2017-06-26 DIAGNOSIS — R945 Abnormal results of liver function studies: Secondary | ICD-10-CM

## 2017-06-26 DIAGNOSIS — N183 Chronic kidney disease, stage 3 unspecified: Secondary | ICD-10-CM

## 2017-06-26 DIAGNOSIS — Z794 Long term (current) use of insulin: Secondary | ICD-10-CM

## 2017-06-26 DIAGNOSIS — Z86718 Personal history of other venous thrombosis and embolism: Secondary | ICD-10-CM | POA: Diagnosis not present

## 2017-06-26 DIAGNOSIS — E119 Type 2 diabetes mellitus without complications: Secondary | ICD-10-CM | POA: Diagnosis not present

## 2017-06-26 DIAGNOSIS — L304 Erythema intertrigo: Secondary | ICD-10-CM | POA: Diagnosis not present

## 2017-06-26 DIAGNOSIS — K851 Biliary acute pancreatitis without necrosis or infection: Secondary | ICD-10-CM | POA: Diagnosis not present

## 2017-06-26 DIAGNOSIS — E8881 Metabolic syndrome: Secondary | ICD-10-CM

## 2017-06-26 DIAGNOSIS — E7849 Other hyperlipidemia: Secondary | ICD-10-CM | POA: Diagnosis not present

## 2017-06-26 DIAGNOSIS — I10 Essential (primary) hypertension: Secondary | ICD-10-CM | POA: Diagnosis not present

## 2017-06-26 DIAGNOSIS — N390 Urinary tract infection, site not specified: Secondary | ICD-10-CM | POA: Diagnosis not present

## 2017-06-26 DIAGNOSIS — Z9049 Acquired absence of other specified parts of digestive tract: Secondary | ICD-10-CM | POA: Diagnosis not present

## 2017-06-26 DIAGNOSIS — R7989 Other specified abnormal findings of blood chemistry: Secondary | ICD-10-CM

## 2017-06-26 LAB — CBC WITH DIFFERENTIAL/PLATELET
BASOS PCT: 0.3 %
Basophils Absolute: 29 cells/uL (ref 0–200)
Eosinophils Absolute: 233 cells/uL (ref 15–500)
Eosinophils Relative: 2.4 %
HEMATOCRIT: 41.8 % (ref 35.0–45.0)
HEMOGLOBIN: 13.8 g/dL (ref 11.7–15.5)
LYMPHS ABS: 1504 {cells}/uL (ref 850–3900)
MCH: 31.9 pg (ref 27.0–33.0)
MCHC: 33 g/dL (ref 32.0–36.0)
MCV: 96.5 fL (ref 80.0–100.0)
MPV: 12 fL (ref 7.5–12.5)
Monocytes Relative: 7.9 %
NEUTROS ABS: 7168 {cells}/uL (ref 1500–7800)
NEUTROS PCT: 73.9 %
Platelets: 288 10*3/uL (ref 140–400)
RBC: 4.33 10*6/uL (ref 3.80–5.10)
RDW: 11.7 % (ref 11.0–15.0)
Total Lymphocyte: 15.5 %
WBC: 9.7 10*3/uL (ref 3.8–10.8)
WBCMIX: 766 {cells}/uL (ref 200–950)

## 2017-06-26 LAB — COMPLETE METABOLIC PANEL WITH GFR
AG RATIO: 1.2 (calc) (ref 1.0–2.5)
ALBUMIN MSPROF: 3.5 g/dL — AB (ref 3.6–5.1)
ALT: 22 U/L (ref 6–29)
AST: 29 U/L (ref 10–35)
Alkaline phosphatase (APISO): 78 U/L (ref 33–130)
BUN/Creatinine Ratio: 20 (calc) (ref 6–22)
BUN: 23 mg/dL (ref 7–25)
CALCIUM: 10.2 mg/dL (ref 8.6–10.4)
CO2: 26 mmol/L (ref 20–32)
CREATININE: 1.15 mg/dL — AB (ref 0.60–0.93)
Chloride: 103 mmol/L (ref 98–110)
GFR, EST AFRICAN AMERICAN: 54 mL/min/{1.73_m2} — AB (ref >=60)
GFR, EST NON AFRICAN AMERICAN: 47 mL/min/{1.73_m2} — AB (ref >=60)
GLOBULIN: 3 g/dL (ref 1.9–3.7)
Glucose, Bld: 284 mg/dL — ABNORMAL HIGH (ref 65–99)
Potassium: 4.4 mmol/L (ref 3.5–5.3)
Sodium: 139 mmol/L (ref 135–146)
TOTAL PROTEIN: 6.5 g/dL (ref 6.1–8.1)
Total Bilirubin: 0.5 mg/dL (ref 0.2–1.2)

## 2017-06-26 NOTE — Telephone Encounter (Signed)
Scheduled appt per 10/9 sch msg. Patient is aware of time.

## 2017-06-26 NOTE — Progress Notes (Signed)
   Subjective:    Patient ID: Julie Mora, female    DOB: 07-20-1941, 76 y.o.   MRN: [id number]  HPI 76 year old Female for transitional care s/p urgent admission with nausea vomiting abdominal pain.  On admission September 19, lipase was 343, BUN 25, creatinine 0.78 and white blood cell count was 15,500.  Was found to have mild edema and diffuse hazy infiltration surrounding the pancreas consistent with acute pancreatitis.  Gallstones were noted.  Subsequently MRI was done showing edematous pancreas with peripancreatic fluid consistent with acute pancreatitis.  No evidence of tumor or necrosis.  Multiple gallstones noted.  No common bile duct stone or biliary obstruction.  Was thought to have gallstone pancreatitis.  Subsequently underwent laparoscopic cholecystectomy on September 21 and did well.  Liver functions prior to surgery showed an SGOT of 73 and SGPT of 216.  She had to be diuresed for 2 days status post surgery.  Was diagnosed with Sirs likely due to pancreatitis.  Was placed on antibiotics empirically and cultures remain negative.  Antibiotics were discontinued and she was discharged home on September 25.  Was discharged home on torsemide.  C met today shows creatinine of 1.15, albumin 3.5.  White blood cell count is normal.  Hemoglobin is normal.  Liver functions are normal.  She has no abdominal pain.  No nausea and vomiting.  Generally feels well.  She has a history of diabetes, hypertension, hyperlipidemia, remote history of DVT and stroke.  She is on chronic anticoagulation.  History of congestive heart failure.  History of recurrent urinary infections.  Her pemphigus is now under good control and treatment by Dr. Alen Blew with Rituxan has been discontinued.  She will go to the Cancer center in the next few days to get high-dose flu vaccine.  Dr. Chalmers Cater is treating her diabetes.  I think she can discontinue torsemide for fluid overload at the present time.  Now seems euvolemic.   May restart simvastatin.  Can  discontinue potassium if not taking diuretic.  Continue Pradaxa.      Review of Systems no new complaints.  See above.     Objective:   Physical Exam  Skin warm and dry.  Nodes none.  Neck is supple without JVD.  Chest clear.  Cardiac exam regular rate and rhythm.  Extremities without edema.      Assessment & Plan:  Status post laparoscopic cholecystectomy for gallstone pancreatitis now for hospital follow-up today doing well.  Plan: Multiple medical issues seem to be under good control.  May return in 4 months.  Dr. Chalmers Cater is taking care of her diabetes.  She returns she will need follow-up lab work including CBC, [place].  A1c's are done by Dr. Chalmers Cater.

## 2017-06-27 ENCOUNTER — Telehealth: Payer: Self-pay | Admitting: Oncology

## 2017-06-27 ENCOUNTER — Telehealth: Payer: Self-pay | Admitting: *Deleted

## 2017-06-27 NOTE — Telephone Encounter (Signed)
Patient called and would like to schedule an appointment for a flu shot. Please call my cell phone.

## 2017-06-27 NOTE — Telephone Encounter (Signed)
Spoke with patient regarding her rescheduled appt per 10/10 sch msg

## 2017-06-29 ENCOUNTER — Ambulatory Visit (HOSPITAL_BASED_OUTPATIENT_CLINIC_OR_DEPARTMENT_OTHER): Payer: Medicare Other

## 2017-06-29 DIAGNOSIS — Z23 Encounter for immunization: Secondary | ICD-10-CM

## 2017-06-29 MED ORDER — INFLUENZA VAC SPLIT QUAD 0.5 ML IM SUSY: 0.5000 mL | PREFILLED_SYRINGE | Freq: Once | INTRAMUSCULAR | Status: DC

## 2017-06-29 MED ORDER — INFLUENZA VAC SPLIT HIGH-DOSE 0.5 ML IM SUSY
0.5000 mL | PREFILLED_SYRINGE | INTRAMUSCULAR | Status: AC
  Administered 2017-06-29: 0.5 mL via INTRAMUSCULAR
  Filled 2017-06-29: qty 0.5

## 2017-06-29 NOTE — Progress Notes (Signed)
Per Dr. Alen Blew, ok to give high dose flu immunization.

## 2017-06-30 ENCOUNTER — Other Ambulatory Visit: Payer: Self-pay | Admitting: Internal Medicine

## 2017-07-24 ENCOUNTER — Other Ambulatory Visit: Payer: Self-pay | Admitting: Family Medicine

## 2017-07-30 ENCOUNTER — Other Ambulatory Visit: Payer: Self-pay | Admitting: Internal Medicine

## 2017-09-01 ENCOUNTER — Other Ambulatory Visit: Payer: Self-pay | Admitting: Internal Medicine

## 2017-10-23 ENCOUNTER — Ambulatory Visit: Payer: Medicare Other | Admitting: Podiatry

## 2017-10-23 ENCOUNTER — Encounter: Payer: Self-pay | Admitting: Podiatry

## 2017-10-23 DIAGNOSIS — E1159 Type 2 diabetes mellitus with other circulatory complications: Secondary | ICD-10-CM | POA: Diagnosis not present

## 2017-10-23 DIAGNOSIS — E1142 Type 2 diabetes mellitus with diabetic polyneuropathy: Secondary | ICD-10-CM | POA: Diagnosis not present

## 2017-10-23 DIAGNOSIS — R609 Edema, unspecified: Secondary | ICD-10-CM | POA: Diagnosis not present

## 2017-10-23 DIAGNOSIS — M79676 Pain in unspecified toe(s): Secondary | ICD-10-CM

## 2017-10-23 DIAGNOSIS — L02619 Cutaneous abscess of unspecified foot: Secondary | ICD-10-CM | POA: Diagnosis not present

## 2017-10-23 DIAGNOSIS — L03119 Cellulitis of unspecified part of limb: Secondary | ICD-10-CM | POA: Diagnosis not present

## 2017-10-23 DIAGNOSIS — B351 Tinea unguium: Secondary | ICD-10-CM | POA: Diagnosis not present

## 2017-10-23 DIAGNOSIS — M79674 Pain in right toe(s): Principal | ICD-10-CM

## 2017-10-23 MED ORDER — CEPHALEXIN 500 MG PO CAPS: 500.0000 mg | ORAL_CAPSULE | Freq: Two times a day (BID) | ORAL | 0 refills | Status: DC

## 2017-10-23 NOTE — Progress Notes (Signed)
This patient presents to my office for continued preventative foot care services.  Patient states that the nails have grown thick and long and are painful walking and wearing her shoes.  She has not been seen in this office for 8 months.  She says she has been diagnosed with diabetes with neuropathy.  She also says she takes Pradaxa  as a blood thinner  She presents the office for preventative foot care services. She also mentions that she has developed a purplish second toe right foot over the last week.    General Appearance  Alert, conversant and in no acute stress.  Vascular  Dorsalis pedis and posterior pulses are palpable  bilaterally.  Capillary return is within normal limits  bilaterally. Temperature is within normal limits  Bilaterally.  Neurologic  Senn-Weinstein monofilament wire test absent   bilaterally. Muscle power within normal limits bilaterally.  Nails Thick disfigured discolored nails with subungual debris bilaterally from hallux to fifth toes bilaterally. No evidence of bacterial infection or drainage bilaterally. Multiple pincer nails noted.  Orthopedic  No limitations of motion of motion feet bilaterally.  No crepitus or effusions noted.  No bony pathology or digital deformities noted.  Skin  normotropic skin with no porokeratosis noted bilaterally.  No signs of infections or ulcers noted.  A purplish second toe right foot, which blanches upon pressure.  There is no evidence of any skin breaks near the second toe.  No increased temperature is noted.  Onychomycosis  B/L  Diabetes with neuropathy.  Purplish toe second right  ROV  Debridment of nails.  After examination of the second toe right, I decided to prescribe antibiotics and to reevaluate this patient in one week.  I am concerned about it being vascular in nature so  patient was told to return to the office if there are worsening changes to the color of her toe.  I did prescribe cephalexin to be taken daily until the  patient returns to the office in one week for reevaluation of the toe.  This patient was also to make a return clinic appointment in 3 months for preventative foot care services   Gardiner Barefoot DPM

## 2017-10-31 ENCOUNTER — Ambulatory Visit: Payer: Medicare Other | Admitting: Podiatry

## 2017-11-01 ENCOUNTER — Telehealth: Payer: Self-pay | Admitting: Internal Medicine

## 2017-11-01 NOTE — Telephone Encounter (Signed)
Patient called to inquire regarding her AVS from 06/2017 visit.  States that she didn't realize that she had Stage 2 kidney disease.  Advised that she thought that all that she had been doing that she no longer had kidney issues.  Advised patient that once she is diagnosed with a staging of kidney disease that this is her diagnosis.  She doesn't progress and go backwards.  She can maintain and remain there and that's a possibility with diet, exercise and medications.  However, if these things do not work to her favor, she can also progress to the next staging of the disease and continue to progress to stage 3,4, etc.  Patient states she had no idea of this.  States that she believes what I'm telling her, she just had no idea that she had kidney issues.    Patient then wanted to know when her next appointment was to be.  Looked at October note.  Advised that she is to follow up in 4 months.  Advised we can go ahead and schedule a February appointment per the 4 month note.  Patient advised that she is to see Dr. Prudence Davidson for some foot issues.  And, she is to follow up with Dr. Chalmers Cater in March.  So, she'll follow up with [place] once she has these issues taken care of.  So, she'll call us once she gets these 2 issues taken care of; maybe towards May.   Made Dr. Renold Genta aware.

## 2017-12-05 ENCOUNTER — Other Ambulatory Visit: Payer: Self-pay | Admitting: Internal Medicine

## 2017-12-13 ENCOUNTER — Telehealth: Payer: Self-pay | Admitting: Podiatry

## 2017-12-13 NOTE — Telephone Encounter (Signed)
I need to speak with Julie Mora as I have a question in regards to medical records. I was seen at the [place] location in 2018 before the office moved to [facility]. I need to know if your records show the date of that appointment, obviously it would be before you moved. I need to know the date of that appointment. I would like a call back today. My number is [phone]. Thank you very much. Bye bye.

## 2017-12-13 NOTE — Telephone Encounter (Signed)
I called pt back and let her know the last time she was seen at our [place] office was on Thursday, 24 August 2016. She asked if she was seen in 2018 and I told her that she was but it was after we had moved. Pt stated thank you for calling her back as she was needing the date for her tax purposes.

## 2018-01-02 ENCOUNTER — Other Ambulatory Visit: Payer: Self-pay | Admitting: Internal Medicine

## 2018-01-23 ENCOUNTER — Encounter: Payer: Self-pay | Admitting: Podiatry

## 2018-01-23 ENCOUNTER — Ambulatory Visit: Payer: Medicare Other | Admitting: Podiatry

## 2018-01-23 DIAGNOSIS — M79674 Pain in right toe(s): Secondary | ICD-10-CM

## 2018-01-23 DIAGNOSIS — B351 Tinea unguium: Secondary | ICD-10-CM | POA: Diagnosis not present

## 2018-01-23 DIAGNOSIS — L03119 Cellulitis of unspecified part of limb: Secondary | ICD-10-CM | POA: Diagnosis not present

## 2018-01-23 DIAGNOSIS — L02619 Cutaneous abscess of unspecified foot: Secondary | ICD-10-CM

## 2018-01-23 MED ORDER — CEPHALEXIN 500 MG PO CAPS
500.0000 mg | ORAL_CAPSULE | Freq: Two times a day (BID) | ORAL | 0 refills | Status: DC
Start: 2018-01-23 — End: 2018-07-06

## 2018-01-23 NOTE — Progress Notes (Addendum)
This patient presents to my office for continued preventative foot care services.  Patient states that the nails have grown thick and long and are painful walking and wearing her shoes.    She says she has been diagnosed with diabetes with neuropathy.  She also says she takes Pradaxa  as a blood thinner  She presents the office for preventative foot care services. She states her second toenail right foot is loose and falling off.  She was given cephalexin to treat the infection last visit and she never returned until today.  General Appearance  Alert, conversant and in no acute stress.  Vascular  Dorsalis pedis and posterior pulses are palpable  bilaterally.  Capillary return is within normal limits  bilaterally. Temperature is within normal limits  Bilaterally.  Neurologic  Senn-Weinstein monofilament wire test absent   bilaterally. Muscle power within normal limits bilaterally.  Nails Thick disfigured discolored nails with subungual debris bilaterally from hallux to fifth toes bilaterally. No evidence of bacterial infection or drainage bilaterally. Multiple pincer nails noted. Her second toenail right has red granulation tissue subungually.   Orthopedic  No limitations of motion of motion feet bilaterally.  No crepitus or effusions noted.  No bony pathology or digital deformities noted.  Skin  normotropic skin with no porokeratosis noted bilaterally.  No signs of infections or ulcers noted.  Her second toe right is inflamed and swollen.      Onychomycosis  B/L  Diabetes with neuropathy.  Subungual abscess/paronychia second toenail right foot.  ROV  Debridment of nails.  Debride necrotic tissue and nail plate second toe nailbed right foot.  Neosporin/DSD.  Prescribe cephalexin.  Told her to perform home soaks.  RTC 2 weeks if the toe has not improved.  RTC 3 months for preventative foot care services. Office to call at home about infection second toe right.   Gardiner Barefoot DPM

## 2018-01-25 ENCOUNTER — Telehealth: Payer: Self-pay | Admitting: Podiatry

## 2018-01-25 NOTE — Telephone Encounter (Signed)
Pt would like to have her soaking instructions emailed to email on file.

## 2018-01-26 ENCOUNTER — Other Ambulatory Visit: Payer: Self-pay | Admitting: Internal Medicine

## 2018-02-27 ENCOUNTER — Other Ambulatory Visit: Payer: Self-pay | Admitting: Internal Medicine

## 2018-03-28 ENCOUNTER — Other Ambulatory Visit: Payer: Self-pay | Admitting: Internal Medicine

## 2018-03-29 ENCOUNTER — Encounter: Payer: Self-pay | Admitting: Internal Medicine

## 2018-04-16 LAB — HM MAMMOGRAPHY

## 2018-04-17 ENCOUNTER — Encounter: Payer: Self-pay | Admitting: Internal Medicine

## 2018-04-22 ENCOUNTER — Other Ambulatory Visit: Payer: Self-pay | Admitting: Internal Medicine

## 2018-04-23 ENCOUNTER — Other Ambulatory Visit: Payer: Self-pay | Admitting: Internal Medicine

## 2018-04-26 ENCOUNTER — Ambulatory Visit: Payer: Medicare Other | Admitting: Podiatry

## 2018-04-26 ENCOUNTER — Encounter: Payer: Self-pay | Admitting: Podiatry

## 2018-04-26 DIAGNOSIS — M79675 Pain in left toe(s): Secondary | ICD-10-CM | POA: Diagnosis not present

## 2018-04-26 DIAGNOSIS — M79674 Pain in right toe(s): Secondary | ICD-10-CM

## 2018-04-26 DIAGNOSIS — B351 Tinea unguium: Secondary | ICD-10-CM | POA: Diagnosis not present

## 2018-04-29 NOTE — Progress Notes (Signed)
Subjective: Julie Mora presents today with cc of painful, discolored, thick toenails which interfere with daily activities and routine tasks.  Pain is aggravated when wearing enclosed shoe gear. Pain is getting progressively worse and relieved with periodic professional debridement.  On last visit, she was given a prescription for foot soaks and cephalexin. She states her toe feel somewhat better today.  Objective:  Vascular Examination: Capillary refill time immediate x 10 Dorsalis pedis and posterior tibial pulses present b/l No digital hair x 10 digits Skin temperature warm to warm b/l  Dermatological Examination: Skin with normal turgor, texture and tone b/l Toenails 1-5 b/l discolored, thick, dystrophic with subungual debris and pain with palpation to nailbeds due to thickness of nails.  Musculoskeletal: Muscle strength 5/5 to all LE muscle groups  Neurological: Sensation intact with 10 gram monofilament. Vibratory sensation intact.  Assessment: Painful onychomycosis toenails 1-5 b/l   Plan: 1. Toenails 1-5 b/l were debrided in length and girth without iatrogenic bleeding. 2. Patient to continue soft, supportive shoe gear 3. Patient to report any pedal injuries to medical professional immediately. 4. Follow up 3 months.  5. Julie Mora is to call should there be a concern in the interim.

## 2018-05-20 ENCOUNTER — Other Ambulatory Visit: Payer: Self-pay | Admitting: Internal Medicine

## 2018-06-12 ENCOUNTER — Other Ambulatory Visit: Payer: Self-pay | Admitting: Internal Medicine

## 2018-06-12 DIAGNOSIS — L109 Pemphigus, unspecified: Secondary | ICD-10-CM

## 2018-06-12 DIAGNOSIS — E8881 Metabolic syndrome: Secondary | ICD-10-CM

## 2018-06-12 DIAGNOSIS — M810 Age-related osteoporosis without current pathological fracture: Secondary | ICD-10-CM

## 2018-06-12 DIAGNOSIS — N183 Chronic kidney disease, stage 3 unspecified: Secondary | ICD-10-CM

## 2018-06-12 DIAGNOSIS — E7849 Other hyperlipidemia: Secondary | ICD-10-CM

## 2018-06-12 DIAGNOSIS — Z86718 Personal history of other venous thrombosis and embolism: Secondary | ICD-10-CM

## 2018-06-12 DIAGNOSIS — Z0000 Encounter for general adult medical examination without abnormal findings: Secondary | ICD-10-CM

## 2018-06-12 DIAGNOSIS — E119 Type 2 diabetes mellitus without complications: Secondary | ICD-10-CM

## 2018-06-12 DIAGNOSIS — I10 Essential (primary) hypertension: Secondary | ICD-10-CM

## 2018-06-14 ENCOUNTER — Other Ambulatory Visit (INDEPENDENT_AMBULATORY_CARE_PROVIDER_SITE_OTHER): Payer: Medicare Other | Admitting: Internal Medicine

## 2018-06-14 DIAGNOSIS — L109 Pemphigus, unspecified: Secondary | ICD-10-CM

## 2018-06-14 DIAGNOSIS — E7849 Other hyperlipidemia: Secondary | ICD-10-CM

## 2018-06-14 DIAGNOSIS — E1165 Type 2 diabetes mellitus with hyperglycemia: Secondary | ICD-10-CM

## 2018-06-14 DIAGNOSIS — N183 Chronic kidney disease, stage 3 unspecified: Secondary | ICD-10-CM

## 2018-06-14 DIAGNOSIS — M810 Age-related osteoporosis without current pathological fracture: Secondary | ICD-10-CM

## 2018-06-14 DIAGNOSIS — E8881 Metabolic syndrome: Secondary | ICD-10-CM

## 2018-06-14 DIAGNOSIS — E119 Type 2 diabetes mellitus without complications: Secondary | ICD-10-CM

## 2018-06-14 DIAGNOSIS — Z86718 Personal history of other venous thrombosis and embolism: Secondary | ICD-10-CM

## 2018-06-14 DIAGNOSIS — Z0000 Encounter for general adult medical examination without abnormal findings: Secondary | ICD-10-CM

## 2018-06-14 DIAGNOSIS — R8290 Unspecified abnormal findings in urine: Secondary | ICD-10-CM

## 2018-06-14 DIAGNOSIS — I10 Essential (primary) hypertension: Secondary | ICD-10-CM

## 2018-06-14 LAB — POCT URINALYSIS DIPSTICK
BILIRUBIN UA: NEGATIVE
Glucose, UA: POSITIVE — AB
KETONES UA: NEGATIVE
Nitrite, UA: NEGATIVE
Protein, UA: POSITIVE — AB
SPEC GRAV UA: 1.015 (ref 1.010–1.025)
Urobilinogen, UA: 0.2 E.U./dL
pH, UA: 6 (ref 5.0–8.0)

## 2018-06-15 LAB — LIPID PANEL
CHOLESTEROL: 130 mg/dL (ref <200)
HDL: 37 mg/dL — AB (ref >50)
LDL Cholesterol (Calc): 68 mg/dL (calc)
Non-HDL Cholesterol (Calc): 93 mg/dL (calc) (ref <130)
Total CHOL/HDL Ratio: 3.5 (calc) (ref <5.0)
Triglycerides: 167 mg/dL — ABNORMAL HIGH (ref <150)

## 2018-06-15 LAB — COMPLETE METABOLIC PANEL WITH GFR
AG RATIO: 1 (calc) (ref 1.0–2.5)
ALBUMIN MSPROF: 3.2 g/dL — AB (ref 3.6–5.1)
ALT: 12 U/L (ref 6–29)
AST: 15 U/L (ref 10–35)
Alkaline phosphatase (APISO): 73 U/L (ref 33–130)
BUN / CREAT RATIO: 22 (calc) (ref 6–22)
BUN: 24 mg/dL (ref 7–25)
CALCIUM: 9.3 mg/dL (ref 8.6–10.4)
CO2: 25 mmol/L (ref 20–32)
Chloride: 102 mmol/L (ref 98–110)
Creat: 1.1 mg/dL — ABNORMAL HIGH (ref 0.60–0.93)
GFR, EST AFRICAN AMERICAN: 56 mL/min/{1.73_m2} — AB (ref >=60)
GFR, EST NON AFRICAN AMERICAN: 49 mL/min/{1.73_m2} — AB (ref >=60)
Globulin: 3.3 g/dL (calc) (ref 1.9–3.7)
Glucose, Bld: 371 mg/dL — ABNORMAL HIGH (ref 65–99)
POTASSIUM: 5.4 mmol/L — AB (ref 3.5–5.3)
SODIUM: 135 mmol/L (ref 135–146)
TOTAL PROTEIN: 6.5 g/dL (ref 6.1–8.1)
Total Bilirubin: 0.4 mg/dL (ref 0.2–1.2)

## 2018-06-15 LAB — CBC WITH DIFFERENTIAL/PLATELET
Basophils Absolute: 43 {cells}/uL (ref 0–200)
Basophils Relative: 0.4 %
Eosinophils Absolute: 375 {cells}/uL (ref 15–500)
Eosinophils Relative: 3.5 %
HCT: 40.3 % (ref 35.0–45.0)
Hemoglobin: 13.4 g/dL (ref 11.7–15.5)
Lymphs Abs: 1980 {cells}/uL (ref 850–3900)
MCH: 31.9 pg (ref 27.0–33.0)
MCHC: 33.3 g/dL (ref 32.0–36.0)
MCV: 96 fL (ref 80.0–100.0)
MPV: 10.7 fL (ref 7.5–12.5)
Monocytes Relative: 7.3 %
Neutro Abs: 7522 {cells}/uL (ref 1500–7800)
Neutrophils Relative %: 70.3 %
Platelets: 279 Thousand/uL (ref 140–400)
RBC: 4.2 Million/uL (ref 3.80–5.10)
RDW: 11.9 % (ref 11.0–15.0)
Total Lymphocyte: 18.5 %
WBC mixed population: 781 {cells}/uL (ref 200–950)
WBC: 10.7 Thousand/uL (ref 3.8–10.8)

## 2018-06-15 LAB — URINE CULTURE
MICRO NUMBER:: 91165302
SPECIMEN QUALITY:: ADEQUATE

## 2018-06-15 LAB — HEMOGLOBIN A1C: Hgb A1c MFr Bld: >14 %{Hb} — ABNORMAL HIGH

## 2018-06-15 LAB — TSH: TSH: 2.24 m[IU]/L (ref 0.40–4.50)

## 2018-06-15 LAB — MICROALBUMIN / CREATININE URINE RATIO
Creatinine, Urine: 95 mg/dL (ref 20–275)
MICROALB/CREAT RATIO: 201 ug/mg{creat} — AB (ref <30)
Microalb, Ur: 19.1 mg/dL

## 2018-06-18 ENCOUNTER — Encounter: Payer: Self-pay | Admitting: Internal Medicine

## 2018-06-18 ENCOUNTER — Ambulatory Visit (INDEPENDENT_AMBULATORY_CARE_PROVIDER_SITE_OTHER): Payer: Medicare Other | Admitting: Internal Medicine

## 2018-06-18 VITALS — BP 110/80 | HR 110 | Ht 60.0 in | Wt 242.0 lb

## 2018-06-18 DIAGNOSIS — N390 Urinary tract infection, site not specified: Secondary | ICD-10-CM

## 2018-06-18 DIAGNOSIS — N183 Chronic kidney disease, stage 3 unspecified: Secondary | ICD-10-CM

## 2018-06-18 DIAGNOSIS — E782 Mixed hyperlipidemia: Secondary | ICD-10-CM

## 2018-06-18 DIAGNOSIS — E8881 Metabolic syndrome: Secondary | ICD-10-CM | POA: Diagnosis not present

## 2018-06-18 DIAGNOSIS — I10 Essential (primary) hypertension: Secondary | ICD-10-CM | POA: Diagnosis not present

## 2018-06-18 DIAGNOSIS — Z0000 Encounter for general adult medical examination without abnormal findings: Secondary | ICD-10-CM

## 2018-06-18 DIAGNOSIS — L109 Pemphigus, unspecified: Secondary | ICD-10-CM

## 2018-06-18 DIAGNOSIS — Z86718 Personal history of other venous thrombosis and embolism: Secondary | ICD-10-CM | POA: Diagnosis not present

## 2018-06-18 DIAGNOSIS — E1165 Type 2 diabetes mellitus with hyperglycemia: Secondary | ICD-10-CM

## 2018-06-18 DIAGNOSIS — Z8673 Personal history of transient ischemic attack (TIA), and cerebral infarction without residual deficits: Secondary | ICD-10-CM

## 2018-06-18 DIAGNOSIS — R609 Edema, unspecified: Secondary | ICD-10-CM

## 2018-06-18 DIAGNOSIS — M810 Age-related osteoporosis without current pathological fracture: Secondary | ICD-10-CM

## 2018-06-18 NOTE — Progress Notes (Signed)
Subjective:    Patient ID: Julie Mora, female    DOB: August 21, 1941, 77 y.o.   MRN: [id number]  HPI 77 year old Female for Medicare wellness, health maintenance exam and evaluation of medical issues. Aunt was ill and pt has let her diabetes get out of control. Dr.Balan , endocrinologist, is working with her to get diabetes under control.  Her hemoglobin A1c is greater than 14% and a year ago was 11.8%.  Discussion with her today about why she let her diabetes get out of control and how dangerous that is.  Currently on Lantus 40 units twice daily and sliding scale Humalog.  Pemphigus seems to have returned.  She will be seeing Dr. Denna Haggard in the near future.  History of recurrent urinary tract infections.  She has 3+ LE today on attempted clean-catch.  Culture will be sent.  Lipid panel shows triglycerides of 167 and a year ago were 158.  She has a low HDL of 37 but total cholesterol was 130 and LDL cholesterol was 68.  Creatinine is 1.10.  History of stage III chronic kidney disease  Blood pressures under good control.  Her weight is 242 pounds and in June 2018 her weight was 232 pounds.  In September 2018 she was admitted to the hospital with gallstone pancreatitis and underwent a laparoscopic cholecystectomy.  History of pemphigus that has been recurrent.  She had treatment with Rituxan in 2018 by Dr. Alen Blew.  Had been treated with this before.  Last exacerbation prior to 2018 had been 3 or 4 years previously.  History of paronychia long finger right hand treated with Septra up with incision and drainage of paronychia by Dr.Kuzma April 2018.  Is seen urologist in the last urology for recurrent urinary infections.  Sees ophthalmologist fairly regularly.  Ophthalmologist is through [facility].  She says she needs a corneal transplant of the right eye.  Initial diagnosis of pemphigus was made in 2010.  In November 2010 she was admitted with vomiting and acute  renal failure.  Subsequently was readmitted in 2010 with C. difficile colitis, adrenal insufficiency and anasarca due to multifactorial reasons.  In early 2011 she developed an infected leg ulcer left lower leg and was bedbound for some time.  Was admitted to [facility] ICU February 2010 for presumed sepsis and tachycardia.  Blood culture grew staph aureus and urine culture grew greater than 100,000 colonies per milliliter E. coli.  Patient was started on Florinef in hospital during hospitalization around February through March 2012.  Several people question while she was on that.  Is on recall and had something to do with her electrolyte balance.  Discharge summary per hospitalist does not address it.  Subsequently this is been discontinued and her electrolytes have remained stable.  Social history: She is a widow.  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  She has a Scientist, water quality.  Does not smoke or consume alcohol.  She is a former Warehouse manager.  No children.  Family history: Father died at age 43 reportedly of pancreatic cancer.  Mother died of complications of "old age and dementia.  One sister in good health.  No brothers.  History of stress and urge urinary incontinence and has to wear protective underwear.  Is on chronic anticoagulation for history of left occipital parietal CVA in 2006 and history of DVT in 2010.  Remote history of iron deficiency.    Review of Systems  Constitutional: Positive for fatigue.  Respiratory:  Negative.   Gastrointestinal: Negative.   Genitourinary: Negative.   Neurological: Negative.   Psychiatric/Behavioral:       Her affect is flat and dysthymic.  I think she may be depressed       Objective:   Physical Exam  Constitutional: She is oriented to person, place, and time. She appears well-developed and well-nourished. No distress.  HENT:  Head: Normocephalic and atraumatic.  Right Ear: External ear normal.  Left Ear: External ear normal.    Mouth/Throat: Oropharynx is clear and moist. No oropharyngeal exudate.  Eyes: Pupils are equal, round, and reactive to light. EOM are normal. Right eye exhibits no discharge. Left eye exhibits no discharge.  Neck: Neck supple. No JVD present. No thyromegaly present.  Cardiovascular: Normal rate, regular rhythm and normal heart sounds.  No murmur heard. Pulmonary/Chest: Effort normal and breath sounds normal. No stridor. No respiratory distress. She has no wheezes.  Breast pendulous but normal female.  Abdominal: Soft. Bowel sounds are normal. She exhibits no distension and no mass. There is no tenderness. There is no rebound and no guarding.  Small reducible umbilical hernia  Genitourinary:  Genitourinary Comments: Bimanual normal.  Pap deferred due to age.  Musculoskeletal:  Trace lower extremity edema  Lymphadenopathy:    She has no cervical adenopathy.  Neurological: She is oriented to person, place, and time. She displays normal reflexes. No cranial nerve deficit. Coordination normal.  Skin: Skin is warm and dry. She is not diaphoretic.  She has multiple erythematous lesions on her back and trunk that look suspicious for recurrence of pemphigus.  Stasis dermatitis both lower extremities.  Psychiatric: Her behavior is normal. Judgment and thought content normal.  Vitals reviewed.         Assessment & Plan:  Poorly controlled diabetes mellitus-to be managed by endocrinologist  Morbid obesity  Recurrent pemphigus-to see dermatologist in the near future  Dependent edema-continue torsemide  Mild elevation of creatinine continue to follow  Elevated triglycerides related to poorly controlled diabetes mellitus-continue simvastatin  Osteoporosis treated with Fosamax.  Bone density study in 2016 showed lowest T score was -3.2.  History of CVA-continue Pradaxa-she wants to see neurologist regarding this issue.  Essential hypertension-stable  Hyperlipidemia-stable triglycerides  are elevated due to poorly controlled diabetes  Chronic kidney disease stage III-stable  History of multiseptated cystic lesion lower pole of left kidney  History of recurrent urinary infections-followed by urologist  History of cystic lesion pancreatic head that appears to be stable based on MRI 2017  Dependent edema treated with diuretic  Remote history of DVT-currently maintained on Pradaxa  Plan: Continue same medications and return in 6 months.  She will see dermatologist regarding possible recurrence of pemphigus.  She wants to see neurologist regarding long-term anticoagulation with Pradaxa.  Continue working on diabetic control with Dr. Chalmers Cater.  Subjective:   Patient presents for Medicare Annual/Subsequent preventive examination.  Review Past Medical/Family/Social: See above   Risk Factors  Current exercise habits: Sedentary Dietary issues discussed: Low-fat low carbohydrate  Cardiac risk factors: Diabetes mellitus, hyperlipidemia  Depression Screen  (Note: if answer to either of the following is "Yes", a more complete depression screening is indicated)   Over the past two weeks, have you felt down, depressed or hopeless? No  Over the past two weeks, have you felt little interest or pleasure in doing things? No Have you lost interest or pleasure in daily life? No Do you often feel hopeless? No Do you cry easily over simple problems? No  Activities of Daily Living  In your present state of health, do you have any difficulty performing the following activities?:   Driving? No longer drives Managing money? No  Feeding yourself? No  Getting from bed to chair? No  Climbing a flight of stairs?  Yes due to ambulatory issues Preparing food and eating?: No  Bathing or showering?  This is an effort for her and she moves slowly Getting dressed: Moves slowly Getting to the toilet?  Move slowly Using the toilet:No  Moving around from place to place: Yes needs cane or  walker for assistance In the past year have you fallen or had a near fall?:No  Are you sexually active? No -she is a widow Do you have more than one partner? No   Hearing Difficulties: No  Do you often ask people to speak up or repeat themselves? No  Do you experience ringing or noises in your ears? No  Do you have difficulty understanding soft or whispered voices? No  Do you feel that you have a problem with memory?  Sometimes Do you often misplace items?  Yes   Home Safety:  Do you have a smoke alarm at your residence? Yes Do you have grab bars in the bathroom?  Yes Do you have throw rugs in your house?  No   Cognitive Testing  Alert? Yes Normal Appearance?Yes  Oriented to person? Yes Place? Yes  Time? Yes  Recall of three objects? Yes  Can perform simple calculations? Yes  Displays appropriate judgment?Yes  Can read the correct time from a watch face?Yes   List the Names of Other Physician/Practitioners you currently use:  See referral list for the physicians patient is currently seeing.  Endocrinologist  Dermatologist   Review of Systems: See above   Objective:     General appearance: Appears stated age and obese  Head: Normocephalic, without obvious abnormality, atraumatic  Eyes: conj clear, EOMi PEERLA  Ears: normal TM's and external ear canals both ears  Nose: Nares normal. Septum midline. Mucosa normal. No drainage or sinus tenderness.  Throat: lips, mucosa, and tongue normal; teeth and gums normal  Neck: no adenopathy, no carotid bruit, no JVD, supple, symmetrical, trachea midline and thyroid not enlarged, symmetric, no tenderness/mass/nodules  No CVA tenderness.  Lungs: clear to auscultation bilaterally  Breasts: normal appearance, no masses or tenderness Heart: regular rate and rhythm, S1, S2 normal, no murmur, click, rub or gallop  Abdomen: soft, non-tender; bowel sounds normal; no masses, no organomegaly  Musculoskeletal: ROM normal in all joints, no  crepitus, no deformity, Normal muscle strengthen. Back  is symmetric, no curvature. Skin: Skin color, texture, turgor normal. No rashes or lesions  Lymph nodes: Cervical, supraclavicular, and axillary nodes normal.  Neurologic: CN 2 -12 Normal, Normal symmetric reflexes. Normal coordination and gait  Psych: Alert & Oriented x 3, Mood stable but dysthymic   Assessment:    Annual wellness medicare exam   Plan:    During the course of the visit the patient was educated and counseled about appropriate screening and preventive services including:   See above she will get flu vaccine at pharmacy     Patient Instructions (the written plan) was given to the patient.  Medicare Attestation  I have personally reviewed:  The patient's medical and social history  Their use of alcohol, tobacco or illicit drugs  Their current medications and supplements  The patient's functional ability including ADLs,fall risks, home safety risks, cognitive, and hearing and visual impairment  Diet and physical activities  Evidence for depression or mood disorders  The patient's weight, height, BMI, and visual acuity have been recorded in the chart. I have made referrals, counseling, and provided education to the patient based on review of the above and I have provided the patient with a written personalized care plan for preventive services.

## 2018-06-19 ENCOUNTER — Encounter: Payer: Self-pay | Admitting: Neurology

## 2018-06-21 ENCOUNTER — Other Ambulatory Visit: Payer: Self-pay | Admitting: Urology

## 2018-06-21 DIAGNOSIS — N281 Cyst of kidney, acquired: Secondary | ICD-10-CM

## 2018-07-01 ENCOUNTER — Ambulatory Visit
Admission: RE | Admit: 2018-07-01 | Discharge: 2018-07-01 | Disposition: A | Discharge status: Home / Self Care | Payer: Medicare Other | Source: Ambulatory Visit | Attending: Urology | Admitting: Urology

## 2018-07-01 DIAGNOSIS — N281 Cyst of kidney, acquired: Secondary | ICD-10-CM

## 2018-07-01 MED ORDER — GADOBENATE DIMEGLUMINE 529 MG/ML IV SOLN
20.0000 mL | Freq: Once | INTRAVENOUS | Status: AC | PRN
  Administered 2018-07-01: 20 mL via INTRAVENOUS

## 2018-07-06 ENCOUNTER — Encounter: Payer: Self-pay | Admitting: Internal Medicine

## 2018-07-06 NOTE — Patient Instructions (Signed)
Continue same medications.  Continue working with endocrinologist to get diabetes under better control.  See dermatologist.  Neurology referral made at your request regarding Pradaxa long-term.  Return in 6 months.

## 2018-07-22 ENCOUNTER — Other Ambulatory Visit: Payer: Self-pay | Admitting: Internal Medicine

## 2018-07-22 ENCOUNTER — Other Ambulatory Visit: Payer: Self-pay | Admitting: Dermatology

## 2018-07-24 ENCOUNTER — Other Ambulatory Visit: Payer: Self-pay | Admitting: Internal Medicine

## 2018-07-29 ENCOUNTER — Ambulatory Visit: Payer: Medicare Other | Admitting: Podiatry

## 2018-07-29 DIAGNOSIS — M79674 Pain in right toe(s): Secondary | ICD-10-CM

## 2018-07-29 DIAGNOSIS — B351 Tinea unguium: Secondary | ICD-10-CM | POA: Diagnosis not present

## 2018-07-29 DIAGNOSIS — M79675 Pain in left toe(s): Secondary | ICD-10-CM | POA: Diagnosis not present

## 2018-07-29 MED ORDER — CLOTRIMAZOLE-BETAMETHASONE 1-0.05 % EX LOTN: TOPICAL_LOTION | CUTANEOUS | 2 refills | Status: DC

## 2018-07-29 NOTE — Patient Instructions (Signed)

## 2018-08-12 NOTE — Progress Notes (Signed)
NEUROLOGY CONSULTATION NOTE  Julie Mora MRN: [id number] DOB: 02/03/1941  Referring provider: Tedra Senegal, MD Primary care provider: Tedra Senegal, MD  Reason for consult:  History of stroke, question regarding need for Pradaxa.  HISTORY OF PRESENT ILLNESS: Julie Mora is a 77 year old right-handed female with hypertension, type 2 diabetes mellitus, pemphigus foliaceous and history of stroke and DVT on Pradaxa who presents for history of stroke.  She is accompanied by her sister who supplements history.  She is on chronic anticoagulation with Pradaxa.    She had an acute left occipital parietal lobe infarct, as noted on MRI of brain without contrast on 05/13/05 (personally reviewed).  She presented with aphasia for about 15-20 minutes.  She was initially started on Aggrenox and ASA 81mg  daily.  She had a DVT in her leg in 2010 and was switched to Coumadin, which was subsequently switched to Pradaxa.    06/14/18 LABS:  LDL 68, Hgb A1c >14  PAST MEDICAL HISTORY: Past Medical History:  Diagnosis Date  . Anemia   . Essential hypertension, benign   . Incontinence   . Other and unspecified hyperlipidemia   . Pemphigus foliaceous   . Renal insufficiency   . Type II or unspecified type diabetes mellitus without mention of complication, not stated as uncontrolled   . Vitamin D deficiency     PAST SURGICAL HISTORY: Past Surgical History:  Procedure Laterality Date  . CHOLECYSTECTOMY N/A 06/08/2017   Procedure: LAPAROSCOPIC CHOLECYSTECTOMY;  Surgeon: Kinsinger, Arta Bruce, MD;  Location: [facility];  Service: General;  Laterality: N/A;    MEDICATIONS: Current Outpatient Medications on File Prior to Visit  Medication Sig Dispense Refill  . ACCU-CHEK AVIVA PLUS test strip USE TO CHECK BLOOD SUGAR BEFORE BREAKFAST AND SUPPER 50 each prn  . acetaminophen (TYLENOL) 500 MG tablet Take 500 mg by mouth every 6 (six) hours as needed for pain.    Marland Kitchen alendronate (FOSAMAX) 70 MG tablet TAKE 1  TABLET(70 MG) BY MOUTH EVERY 7 DAYS WITH A FULL GLASS OF WATER AND ON AN EMPTY STOMACH 4 tablet 5  . BD PEN NEEDLE NANO U/F 32G X 4 MM MISC USE AS DIRECTED 100 each prn  . CALCIUM ASCORBATE PO Take 1 tablet by mouth daily.    . Cholecalciferol (VITAMIN D) 2000 UNITS tablet Take 2,000 Units by mouth daily.      . clotrimazole-betamethasone (LOTRISONE) lotion Apply to both feet twice daily 30 mL 2  . ferrous gluconate (FERGON) 324 MG tablet Take 1 tablet (324 mg total) by mouth daily with breakfast. 90 tablet 3  . fludrocortisone (FLORINEF) 0.1 MG tablet     . insulin aspart (NOVOLOG) 100 UNIT/ML injection Inject 5-12 Units into the skin 2 (two) times daily as needed for high blood sugar. (Patient taking differently: Inject 4-14 Units into the skin 3 (three) times daily with meals. ) 3 vial PRN  . Insulin Glargine (LANTUS SOLOSTAR) 100 UNIT/ML Solostar Pen Inject 35 units in skin at bedtime (Patient taking differently: Inject 20-45 Units into the skin 2 (two) times daily. 20 units in the AM and  45 units in the PM) 15 mL prn  . insulin lispro (HUMALOG) 100 UNIT/ML KiwkPen     . Lactobacillus Rhamnosus, GG, (CULTURELLE PO) Take 1 capsule by mouth daily.    . metFORMIN (GLUCOPHAGE) 500 MG tablet     . multivitamin (THERAGRAN) per tablet Take 1 tablet by mouth daily.      Marland Kitchen nystatin cream (MYCOSTATIN) Apply 1 application  topically 2 (two) times daily. 30 g 0  . oxybutynin (DITROPAN-XL) 10 MG 24 hr tablet     . Polyethyl Glycol-Propyl Glycol (SYSTANE) 0.4-0.3 % SOLN Apply 1 drop to eye 3 (three) times daily. Both eyes, as needed    . potassium chloride (K-DUR) 10 MEQ tablet TAKE 1 TABLET(10 MEQ) BY MOUTH DAILY 90 tablet 0  . potassium chloride (K-DUR,KLOR-CON) 10 MEQ tablet     . PRADAXA 75 MG CAPS capsule TAKE 1 CAPSULE(75 MG) BY MOUTH TWICE DAILY 60 capsule 5  . prednisoLONE acetate (PRED FORTE) 1 % ophthalmic suspension Place 1 drop into both eyes 2 (two) times daily.     . silver sulfADIAZINE  (SILVADENE) 1 % cream     . simvastatin (ZOCOR) 20 MG tablet TAKE 1 TABLET BY MOUTH EVERY NIGHT AT BEDTIME 90 tablet 3  . tacrolimus (PROTOPIC) 0.1 % ointment     . tolterodine (DETROL LA) 4 MG 24 hr capsule     . torsemide (DEMADEX) 20 MG tablet TAKE 1 TABLET(20 MG) BY MOUTH DAILY 90 tablet 3   No current facility-administered medications on file prior to visit.     ALLERGIES: Allergies  Allergen Reactions  . Azathioprine Shortness Of Breath  . Cellcept [Mycophenolate Mofetil] Nausea Only    severe nausea  . Ramipril Cough    FAMILY HISTORY: Family History  Problem Relation Age of Onset  . Cancer Father    SOCIAL HISTORY: Social History   Socioeconomic History  . Marital status: Widowed    Spouse name: Not on file  . Number of children: Not on file  . Years of education: Not on file  . Highest education level: Not on file  Occupational History  . Not on file  Social Needs  . Financial resource strain: Not on file  . Food insecurity:    Worry: Not on file    Inability: Not on file  . Transportation needs:    Medical: Not on file    Non-medical: Not on file  Tobacco Use  . Smoking status: Never Smoker  . Smokeless tobacco: Never Used  Substance and Sexual Activity  . Alcohol use: No  . Drug use: No  . Sexual activity: Never  Lifestyle  . Physical activity:    Days per week: Not on file    Minutes per session: Not on file  . Stress: Not on file  Relationships  . Social connections:    Talks on phone: Not on file    Gets together: Not on file    Attends religious service: Not on file    Active member of club or organization: Not on file    Attends meetings of clubs or organizations: Not on file    Relationship status: Not on file  . Intimate partner violence:    Fear of current or ex partner: Not on file    Emotionally abused: Not on file    Physically abused: Not on file    Forced sexual activity: Not on file  Other Topics Concern  . Not on file    Social History Narrative  . Not on file    REVIEW OF SYSTEMS: Constitutional: No fevers, chills, or sweats, no generalized fatigue, change in appetite Eyes: No visual changes, double vision, eye pain Ear, nose and throat: No hearing loss, ear pain, nasal congestion, sore throat Cardiovascular: No chest pain, palpitations Respiratory:  No shortness of breath at rest or with exertion, wheezes GastrointestinaI: No nausea, vomiting, diarrhea, abdominal  pain, fecal incontinence Genitourinary:  No dysuria, urinary retention or frequency Musculoskeletal:  No neck pain, back pain Integumentary: No rash, pruritus, skin lesions Neurological: as above Psychiatric: No depression, insomnia, anxiety Endocrine: No palpitations, fatigue, diaphoresis, mood swings, change in appetite, change in weight, increased thirst Hematologic/Lymphatic:  No purpura, petechiae. Allergic/Immunologic: no itchy/runny eyes, nasal congestion, recent allergic reactions, rashes  PHYSICAL EXAM: Blood pressure 104/70, pulse 84, height 5' (1.524 m), weight 236 lb (107 kg), SpO2 90 %. General: No acute distress.  Patient appears well-groomed.   Head:  Normocephalic/atraumatic Eyes:  fundi examined but not visualized Neck: supple, no paraspinal tenderness, full range of motion Back: No paraspinal tenderness Heart: regular rate and rhythm Lungs: Clear to auscultation bilaterally. Vascular: No carotid bruits. Neurological Exam: Mental status: alert and oriented to person, place, and time, recent and remote memory intact, fund of knowledge intact, attention and concentration intact, speech fluent and not dysarthric, language intact. Cranial nerves: CN I: not tested CN II: pupils equal, round and reactive to light, visual fields intact CN III, IV, VI:  full range of motion, no nystagmus, no ptosis CN V: facial sensation intact CN VII: upper and lower face symmetric CN VIII: hearing intact CN IX, X: gag intact, uvula  midline CN XI: sternocleidomastoid and trapezius muscles intact CN XII: tongue midline Bulk & Tone: normal, no fasciculations. Motor:  5/5 throughout  Sensation:  Pinprick and vibration sensation reduced in feet Deep Tendon Reflexes:  absent throughout, toes downgoing.   Finger to nose testing:  Without dysmetria.   Gait:  Cautious wide-based gait.  IMPRESSION: 1.  History of left parietal/occipital infarct, embolic of unknown source 2.  History of DVT 3.  Chronic anticoagulation 4.  Type 2 diabetes mellitus, uncontrolled 5.  Hypertension  It appears that she was switched to Coumadin in December 2010 after developing a DVT in her leg.  If that is the case, then she does not need to remain on Pradaxa for secondary stroke prevention.  If the DVT is an isolated event, then she may not need to remain on anticoagulation at all.  1.  Unless otherwise indicated, from my standpoint, she does not need to remain on Pradaxa for secondary stroke prevention.  Instead, she may take ASA 81mg  daily.  Her sister will bring me the notes from her hospitalization in December 2010 for my review. 2.  She should continue simvastatin (LDL at goal of less than 70) 3.  Continue blood pressure control 4.  Optimize glycemic control 5.  Mediterranean diet 6.  Follow up as needed.  Thank you for allowing me to take part in the care of this patient.  Metta Clines, DO  CC: Tedra Senegal, MD

## 2018-08-13 ENCOUNTER — Encounter: Payer: Self-pay | Admitting: Podiatry

## 2018-08-13 ENCOUNTER — Ambulatory Visit: Payer: Medicare Other | Admitting: Neurology

## 2018-08-13 ENCOUNTER — Encounter: Payer: Self-pay | Admitting: Neurology

## 2018-08-13 VITALS — BP 104/70 | HR 84 | Ht 60.0 in | Wt 236.0 lb

## 2018-08-13 DIAGNOSIS — I63412 Cerebral infarction due to embolism of left middle cerebral artery: Secondary | ICD-10-CM | POA: Diagnosis not present

## 2018-08-13 DIAGNOSIS — E785 Hyperlipidemia, unspecified: Secondary | ICD-10-CM

## 2018-08-13 DIAGNOSIS — E1165 Type 2 diabetes mellitus with hyperglycemia: Secondary | ICD-10-CM

## 2018-08-13 DIAGNOSIS — Z86718 Personal history of other venous thrombosis and embolism: Secondary | ICD-10-CM | POA: Diagnosis not present

## 2018-08-13 DIAGNOSIS — Z7901 Long term (current) use of anticoagulants: Secondary | ICD-10-CM | POA: Diagnosis not present

## 2018-08-13 DIAGNOSIS — I10 Essential (primary) hypertension: Secondary | ICD-10-CM

## 2018-08-13 NOTE — Patient Instructions (Signed)
It seems like the Coumadin (then Pradaxa) was started for the blood clot in the leg from 2010 (not the stroke).  Therefore, you do not need to be on Pradaxa for secondary stroke prevention.  For secondary stroke prevention, you may take aspirin EC 81mg  daily.  Continue simvastatin.  Try to work on controlling your diabetes.

## 2018-08-13 NOTE — Progress Notes (Signed)
Subjective: Julie Mora presents today with painful, thick toenails 1-5 b/l that she cannot cut and which interfere with daily activities.  Pain is aggravated when wearing enclosed shoe gear.  Objective: Vascular Examination: Capillary refill time immediate x 10 Dorsalis pedis and Posterior tibial pulses present b/l Digital hair x 10 digits absent Skin temperature gradient WNL b/l  Dermatological Examination: Skin with normal turgor, texture and tone b/l  Toenails 1-5 b/l discolored, thick, dystrophic with subungual debris and pain with palpation to nailbeds due to thickness of nails.  Musculoskeletal: Muscle strength 5/5 to all LE muscle groups  Neurological: Sensation intact with 10 gram monofilament. Vibratory sensation intact.  Assessment: Painful onychomycosis toenails 1-5 b/l   Plan: 1. Toenails 1-5 b/l were debrided in length and girth without iatrogenic bleeding. Rx was sent for Lotrisone Cream to be applied to both feet and between toes bid for 4 weeks. 2. Patient to continue soft, supportive shoe gear 3. Patient to report any pedal injuries to medical professional immediately. 4. Follow up 3 months. Patient/POA to call should there be a concern in the interim.

## 2018-08-14 ENCOUNTER — Ambulatory Visit: Payer: Medicare Other | Admitting: Neurology

## 2018-09-14 ENCOUNTER — Other Ambulatory Visit: Payer: Self-pay | Admitting: Internal Medicine

## 2018-09-22 ENCOUNTER — Other Ambulatory Visit: Payer: Self-pay | Admitting: Internal Medicine

## 2018-10-10 ENCOUNTER — Other Ambulatory Visit: Payer: Self-pay | Admitting: Nurse Practitioner

## 2018-10-15 ENCOUNTER — Telehealth: Payer: Self-pay | Admitting: Neurology

## 2018-10-15 NOTE — Telephone Encounter (Signed)
Patient called and is needing to see if Dr. Tomi Likens can refer her or tell her Where to go for her Trigger Finger? She said that it developed within the last 24 to 48 hours. Please Call. Thanks

## 2018-10-15 NOTE — Telephone Encounter (Signed)
Called Pt, LMOVM advising Pt to return my call.

## 2018-10-15 NOTE — Telephone Encounter (Signed)
A hand specialist/surgeon, such as Dr. Amedeo Plenty at [facility].  She should first see her PCP for evaluation and she can make a referral if she feels it is appropriate.

## 2018-10-16 ENCOUNTER — Telehealth: Payer: Self-pay | Admitting: Neurology

## 2018-10-16 NOTE — Telephone Encounter (Signed)
See previous note

## 2018-10-16 NOTE — Telephone Encounter (Signed)
Called and spoke with Pt, she will contact PCP

## 2018-10-16 NOTE — Telephone Encounter (Signed)
Patient returning your call please call

## 2018-10-23 ENCOUNTER — Telehealth: Payer: Self-pay | Admitting: Neurology

## 2018-10-23 DIAGNOSIS — R252 Cramp and spasm: Secondary | ICD-10-CM | POA: Insufficient documentation

## 2018-10-23 DIAGNOSIS — M180 Bilateral primary osteoarthritis of first carpometacarpal joints: Secondary | ICD-10-CM | POA: Insufficient documentation

## 2018-10-23 DIAGNOSIS — L03011 Cellulitis of right finger: Secondary | ICD-10-CM | POA: Insufficient documentation

## 2018-10-23 NOTE — Telephone Encounter (Signed)
Sandi- please call patient.

## 2018-10-23 NOTE — Telephone Encounter (Signed)
Patient went to [place] office today and they say that her problem with her hand is Neurologic. She would like to speak to someone about this. I told her that Tomi Likens was out office the office this afternoon and we may have to call her tomorrow

## 2018-10-24 NOTE — Telephone Encounter (Signed)
Called Pt, LMOVM advising we will need notes from the appt she was told she had neurological problem with her hand, and that she will need to call and make the next available f/u appt to see Dr. Tomi Likens.

## 2018-10-24 NOTE — Telephone Encounter (Signed)
I am not familiar with her hand problem.  I saw her once for opinion regarding use of her blood thinner.  The office should send [place] the notes and patient will need to make a follow up appointment.

## 2018-10-28 ENCOUNTER — Ambulatory Visit: Payer: Medicare Other | Admitting: Podiatry

## 2018-10-30 ENCOUNTER — Encounter: Payer: Self-pay | Admitting: Internal Medicine

## 2018-10-30 LAB — HM DIABETES EYE EXAM

## 2018-11-03 ENCOUNTER — Other Ambulatory Visit: Payer: Self-pay | Admitting: Internal Medicine

## 2018-12-23 ENCOUNTER — Other Ambulatory Visit: Payer: Self-pay | Admitting: Internal Medicine

## 2018-12-23 MED ORDER — POTASSIUM CHLORIDE ER 10 MEQ PO TBCR: EXTENDED_RELEASE_TABLET | ORAL | 0 refills | Status: DC

## 2018-12-23 NOTE — Telephone Encounter (Signed)
Faxed Medication refill request  [facility] - [place]  Last refill 09/22/2018  Last OV - CPE 06/18/2018  potassium chloride (K-DUR) 10 MEQ tablet

## 2019-01-20 ENCOUNTER — Telehealth: Payer: Self-pay | Admitting: Internal Medicine

## 2019-01-20 NOTE — Telephone Encounter (Signed)
LVM to CB and schedule CPE and Labs after 06/19/2019

## 2019-03-19 ENCOUNTER — Telehealth: Payer: Self-pay | Admitting: Internal Medicine

## 2019-03-19 MED ORDER — POTASSIUM CHLORIDE ER 10 MEQ PO TBCR: EXTENDED_RELEASE_TABLET | ORAL | 0 refills | Status: DC

## 2019-03-19 NOTE — Telephone Encounter (Signed)
Received Fax RX request from  Pharmacy - [facility]  Medication - potassium chloride (K-DUR) 10 MEQ tablet   Last Refill - 12-23-18  Last OV - 01-20-19  Last CPE - 06-18-18

## 2019-03-24 ENCOUNTER — Ambulatory Visit: Payer: Medicare Other | Admitting: Orthotics

## 2019-03-24 ENCOUNTER — Other Ambulatory Visit: Payer: Self-pay

## 2019-03-24 DIAGNOSIS — E1159 Type 2 diabetes mellitus with other circulatory complications: Secondary | ICD-10-CM

## 2019-03-24 NOTE — Progress Notes (Signed)
Patient needs to see Dr. Darnell Level for DBS foot exam before casting; as of right now, there isn't anything in her chart to qualify her for DBS.

## 2019-03-31 ENCOUNTER — Telehealth: Payer: Self-pay | Admitting: Internal Medicine

## 2019-03-31 NOTE — Telephone Encounter (Signed)
Set up visit as virtual visit and if she cannot do video we can do audio/phone visit

## 2019-03-31 NOTE — Telephone Encounter (Signed)
Scheduled Virtual Visit °

## 2019-03-31 NOTE — Telephone Encounter (Signed)
Julie Mora [phone] -- cell [phone] -- home  [name] called to see if she could possibly have a phone visit to discuss her getting some assistance at home, meal planning, diabetics and maybe some other services.

## 2019-04-01 ENCOUNTER — Encounter: Payer: Self-pay | Admitting: Internal Medicine

## 2019-04-01 ENCOUNTER — Ambulatory Visit (INDEPENDENT_AMBULATORY_CARE_PROVIDER_SITE_OTHER): Payer: Medicare Other | Admitting: Internal Medicine

## 2019-04-01 DIAGNOSIS — Z7901 Long term (current) use of anticoagulants: Secondary | ICD-10-CM

## 2019-04-01 DIAGNOSIS — M810 Age-related osteoporosis without current pathological fracture: Secondary | ICD-10-CM

## 2019-04-01 DIAGNOSIS — Z794 Long term (current) use of insulin: Secondary | ICD-10-CM

## 2019-04-01 DIAGNOSIS — IMO0001 Reserved for inherently not codable concepts without codable children: Secondary | ICD-10-CM

## 2019-04-01 DIAGNOSIS — Z8673 Personal history of transient ischemic attack (TIA), and cerebral infarction without residual deficits: Secondary | ICD-10-CM

## 2019-04-01 DIAGNOSIS — E119 Type 2 diabetes mellitus without complications: Secondary | ICD-10-CM

## 2019-04-01 DIAGNOSIS — E1165 Type 2 diabetes mellitus with hyperglycemia: Secondary | ICD-10-CM

## 2019-04-01 DIAGNOSIS — E8881 Metabolic syndrome: Secondary | ICD-10-CM | POA: Diagnosis not present

## 2019-04-01 DIAGNOSIS — I10 Essential (primary) hypertension: Secondary | ICD-10-CM

## 2019-04-01 DIAGNOSIS — Z9181 History of falling: Secondary | ICD-10-CM

## 2019-04-01 DIAGNOSIS — N3941 Urge incontinence: Secondary | ICD-10-CM

## 2019-04-01 DIAGNOSIS — E782 Mixed hyperlipidemia: Secondary | ICD-10-CM

## 2019-04-01 NOTE — Telephone Encounter (Signed)
Seidy called back to say that she would like [facility] instead of [facility]. She has had them in the past.

## 2019-04-01 NOTE — Patient Instructions (Signed)
We will contact [facility].  Patient advised to make appointment for annual Medicare wellness and physical exam for later this year.
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
## 2019-04-04 ENCOUNTER — Ambulatory Visit: Payer: Medicare Other | Admitting: Podiatry

## 2019-04-04 NOTE — Telephone Encounter (Signed)
Called patient to let her know that [name] is now [facility] and they do not have any CNA's that can come out at this time. Julie Mora is going to check with her insurance company to see who we can send referral to for [name] needs.

## 2019-04-07 ENCOUNTER — Other Ambulatory Visit: Payer: Self-pay

## 2019-04-07 ENCOUNTER — Encounter: Payer: Self-pay | Admitting: Podiatry

## 2019-04-07 ENCOUNTER — Ambulatory Visit (INDEPENDENT_AMBULATORY_CARE_PROVIDER_SITE_OTHER): Payer: Medicare Other | Admitting: Podiatry

## 2019-04-07 DIAGNOSIS — M79675 Pain in left toe(s): Secondary | ICD-10-CM

## 2019-04-07 DIAGNOSIS — B351 Tinea unguium: Secondary | ICD-10-CM | POA: Diagnosis not present

## 2019-04-07 DIAGNOSIS — M79674 Pain in right toe(s): Secondary | ICD-10-CM | POA: Diagnosis not present

## 2019-04-07 NOTE — Patient Instructions (Signed)

## 2019-04-10 NOTE — Progress Notes (Signed)
Subjective:  Julie Mora presents to clinic today with cc of  painful, thick, discolored, elongated toenails 1-5 b/l that become tender and cannot cut because of thickness.  Pain is aggravated when wearing enclosed shoe gear. She voices no new problems on today's visit.  She has h/o chronic lymphedema.   Elby Showers, MD is her PCP.   Current Outpatient Medications:  .  ACCU-CHEK AVIVA PLUS test strip, USE TO CHECK BLOOD SUGAR BEFORE BREAKFAST AND SUPPER, Disp: 50 each, Rfl: prn .  acetaminophen (TYLENOL) 500 MG tablet, Take 500 mg by mouth every 6 (six) hours as needed for pain., Disp: , Rfl:  .  alendronate (FOSAMAX) 70 MG tablet, TAKE 1 TABLET(70 MG) BY MOUTH EVERY 7 DAYS WITH A FULL GLASS OF WATER AND ON AN EMPTY STOMACH, Disp: 4 tablet, Rfl: 5 .  aspirin EC 81 MG tablet, Take by mouth., Disp: , Rfl:  .  BD PEN NEEDLE NANO U/F 32G X 4 MM MISC, USE AS DIRECTED, Disp: 100 each, Rfl: prn .  CALCIUM ASCORBATE PO, Take 1 tablet by mouth daily., Disp: , Rfl:  .  Cholecalciferol (VITAMIN D) 2000 UNITS tablet, Take 2,000 Units by mouth daily.  , Disp: , Rfl:  .  clotrimazole-betamethasone (LOTRISONE) lotion, Apply to both feet twice daily, Disp: 30 mL, Rfl: 2 .  ferrous gluconate (FERGON) 324 MG tablet, TAKE 1 TABLET(324 MG) BY MOUTH DAILY WITH BREAKFAST, Disp: 90 tablet, Rfl: 3 .  fludrocortisone (FLORINEF) 0.1 MG tablet, , Disp: , Rfl:  .  gabapentin (NEURONTIN) 300 MG capsule, TK ONE C PO  HS, Disp: , Rfl:  .  insulin aspart (NOVOLOG) 100 UNIT/ML injection, Inject 5-12 Units into the skin 2 (two) times daily as needed for high blood sugar. (Patient taking differently: Inject 4-14 Units into the skin 3 (three) times daily with meals. ), Disp: 3 vial, Rfl: PRN .  Insulin Glargine (LANTUS SOLOSTAR) 100 UNIT/ML Solostar Pen, Inject 35 units in skin at bedtime (Patient taking differently: Inject 20-45 Units into the skin 2 (two) times daily. 20 units in the AM and  45 units in the PM), Disp: 15  mL, Rfl: prn .  insulin lispro (HUMALOG) 100 UNIT/ML KiwkPen, , Disp: , Rfl:  .  Lactobacillus Rhamnosus, GG, (CULTURELLE PO), Take 1 capsule by mouth daily., Disp: , Rfl:  .  metFORMIN (GLUCOPHAGE) 500 MG tablet, , Disp: , Rfl:  .  multivitamin (THERAGRAN) per tablet, Take 1 tablet by mouth daily.  , Disp: , Rfl:  .  MYRBETRIQ 50 MG TB24 tablet, TK 1 T PO QD, Disp: , Rfl:  .  nystatin cream (MYCOSTATIN), Apply 1 application topically 2 (two) times daily., Disp: 30 g, Rfl: 0 .  oxybutynin (DITROPAN-XL) 10 MG 24 hr tablet, , Disp: , Rfl:  .  Polyethyl Glycol-Propyl Glycol (SYSTANE) 0.4-0.3 % SOLN, Apply 1 drop to eye 3 (three) times daily. Both eyes, as needed, Disp: , Rfl:  .  potassium chloride (K-DUR) 10 MEQ tablet, TAKE 1 TABLET(10 MEQ) BY MOUTH DAILY, Disp: 90 tablet, Rfl: 0 .  potassium chloride (K-DUR,KLOR-CON) 10 MEQ tablet, , Disp: , Rfl:  .  PRADAXA 75 MG CAPS capsule, TAKE 1 CAPSULE(75 MG) BY MOUTH TWICE DAILY, Disp: 60 capsule, Rfl: 5 .  prednisoLONE acetate (PRED FORTE) 1 % ophthalmic suspension, Place 1 drop into both eyes 2 (two) times daily. , Disp: , Rfl:  .  silver sulfADIAZINE (SILVADENE) 1 % cream, , Disp: , Rfl:  .  simvastatin (  ZOCOR) 20 MG tablet, TAKE 1 TABLET BY MOUTH EVERY NIGHT AT BEDTIME, Disp: 90 tablet, Rfl: 3 .  tacrolimus (PROTOPIC) 0.1 % ointment, , Disp: , Rfl:  .  tolterodine (DETROL LA) 4 MG 24 hr capsule, , Disp: , Rfl:  .  torsemide (DEMADEX) 20 MG tablet, TAKE 1 TABLET(20 MG) BY MOUTH DAILY, Disp: 90 tablet, Rfl: 3 .  triamcinolone cream (KENALOG) 0.1 %, APP AA QD UTD, Disp: , Rfl:    Allergies  Allergen Reactions  . Azathioprine Shortness Of Breath and Other (See Comments)    severly allergic. nausea, diarrhea, low blood pressure, fever, chills, rash. was hospitalized due to side effects in feb 2012.  . Cellcept [Mycophenolate Mofetil] Nausea Only    severe nausea  . Ramipril Cough     Objective: Physical Examination:  Vascular  Examination: Capillary refill time immediate x 10 digits.  Palpable DP/PT pulses b/l.  Digital hair absent b/l.  Lymphedema noted BLE.  Skin temperature gradient WNL b/l.  Dermatological Examination: Skin with normal turgor, texture and tone b/l.  No open wounds b/l.  No interdigital macerations noted b/l.  Elongated, thick, discolored brittle toenails with subungual debris and pain on dorsal palpation of nailbeds 1-5 b/l.  Musculoskeletal Examination: Muscle strength 5/5 to all muscle groups b/l.  Hammertoes 1-4 b/l.  No pain, crepitus or joint discomfort with active/passive ROM.  Neurological Examination: Sensation intact 5/5 b/l with 10 gram monofilament.  Vibratory sensation intact b/l.  Proprioceptive sensation intact b/l.  Assessment: Mycotic nail infection with pain 1-5 b/l Hammertoe deformity 1-4 b/l Lymphedema BLE NIDDM  Plan: 1. Toenails 1-5 b/l were debrided in length and girth without iatrogenic laceration. 2.  Continue soft, supportive shoe gear daily. 3.  Report any pedal injuries to medical professional. 4.  Follow up 3 months. 5.  Patient/POA to call should there be a question/concern in there interim.

## 2019-04-11 NOTE — Telephone Encounter (Signed)
Called patient to see if she had decided on who she wanted for Home Health services, she stated she was still working on it and will call me back to let me know.

## 2019-04-18 ENCOUNTER — Other Ambulatory Visit: Payer: Self-pay | Admitting: Internal Medicine

## 2019-04-24 ENCOUNTER — Ambulatory Visit: Payer: Medicare Other | Admitting: Orthotics

## 2019-04-25 ENCOUNTER — Other Ambulatory Visit: Payer: Self-pay

## 2019-04-25 ENCOUNTER — Ambulatory Visit: Payer: Medicare Other | Admitting: Orthotics

## 2019-04-25 ENCOUNTER — Encounter: Payer: Self-pay | Admitting: Podiatry

## 2019-04-25 ENCOUNTER — Ambulatory Visit (INDEPENDENT_AMBULATORY_CARE_PROVIDER_SITE_OTHER): Payer: Medicare Other | Admitting: Podiatry

## 2019-04-25 DIAGNOSIS — L600 Ingrowing nail: Secondary | ICD-10-CM | POA: Diagnosis not present

## 2019-04-25 DIAGNOSIS — L03031 Cellulitis of right toe: Secondary | ICD-10-CM | POA: Diagnosis not present

## 2019-04-25 DIAGNOSIS — L02611 Cutaneous abscess of right foot: Secondary | ICD-10-CM | POA: Diagnosis not present

## 2019-04-25 MED ORDER — MUPIROCIN 2 % EX OINT: TOPICAL_OINTMENT | CUTANEOUS | 0 refills | Status: AC

## 2019-04-25 MED ORDER — CEPHALEXIN 500 MG PO CAPS: 500.0000 mg | ORAL_CAPSULE | Freq: Four times a day (QID) | ORAL | 0 refills | Status: AC

## 2019-04-25 NOTE — Patient Instructions (Addendum)
EPSOM SALT FOOT SOAK INSTRUCTIONS  Take oral antibiotics until all are gone as prescribed by your Doctor  1.  Place 1/4 cup of epsom salts in 2 quarts of warm tap water. IF YOU ARE DIABETIC, OR HAVE NEUROPATHY,  CHECK THE TEMPERATURE OF THE WATER WITH YOUR ELBOW.  2.  Submerge your foot/feet in the solution and soak for 15 minutes.      3.  Next, remove your foot or feet from solution, blot dry the affected area.    4.  Apply antibiotic ointment and cover with fabric band-aid .  5.  This soak should be done once a day for 10 days.   6.  Monitor for any signs/symptoms of infection such as redness, swelling, odor, drainage, increased pain,  or non-healing of digit.   7.  Please do not hesitate to call the office and speak to a Nurse or Doctor if you have questions.   8.  If you experience fever, chills, nightsweats, nausea or vomiting with worsening of digit, please go to the emergency room.   Paronychia Paronychia is an infection of the skin. It happens near a fingernail or toenail. It may cause pain and swelling around the nail. In some cases, a fluid-filled bump (abscess) can form near or under the nail. Usually, this condition is not serious, and it clears up with treatment. Follow these instructions at home: Wound care  Keep the affected area clean.  Soak the fingers or toes in warm water as told by your doctor. Keep the area dry when you are not soaking it.  Do not try to drain a fluid-filled bump on your own.  Follow instructions from your doctor about how to take care of the affected area. Make sure you: ? Wash your hands with soap and water before you change your bandage (dressing). If you cannot use soap and water, use hand sanitizer. ? Change your bandage as told by your doctor.  If you had a fluid-filled bump and your doctor drained it, check the area every day for signs of infection. Check for: ? Redness, swelling, or pain. ? Fluid or blood. ? Warmth. ? Pus or a bad  smell. Medicines   Take over-the-counter and prescription medicines only as told by your doctor.  If you were prescribed an antibiotic medicine, take it as told by your doctor. Do not stop taking it even if you start to feel better. General instructions  Avoid touching any chemicals.  Do not pick at the affected area. Prevention  To prevent this condition from happening again: ? Wear rubber gloves when putting your hands in water for washing dishes or other tasks. ? Wear gloves if your hands might touch cleaners or chemicals. ? Avoid injuring your nails or fingertips. ? Do not bite your nails or tear hangnails. ? Do not cut your nails very short. ? Do not cut the skin at the base and sides of the nail (cuticles). ? Use clean nail clippers or scissors when trimming nails. Contact a doctor if:  You feel worse.  You do not get better.  You have more fluid, blood, or pus coming from the affected area.  Your finger or knuckle is swollen or is hard to move. Get help right away if you have:  A fever or chills.  Redness spreading from the affected area.  Pain in a joint or muscle. Summary  Paronychia is an infection of the skin. It happens near a fingernail or toenail.  This condition  may cause pain and swelling around the nail.  Soak the fingers or toes in warm water as told by your doctor.  Usually, this condition is not serious, and it clears up with treatment. This information is not intended to replace advice given to you by your health care provider. Make sure you discuss any questions you have with your health care provider. Document Released: 08/23/2009 Document Revised: 09/21/2017 Document Reviewed: 09/17/2017 Elsevier Patient Education  2020 [place] An ingrown toenail occurs when the corner or sides of a toenail grow into the surrounding skin. This causes discomfort and pain. The big toe is most commonly affected, but any of the toes can  be affected. If an ingrown toenail is not treated, it can become infected. What are the causes? This condition may be caused by:  Wearing shoes that are too small or tight.  An injury, such as stubbing your toe or having your toe stepped on.  Improper cutting or care of your toenails.  Having nail or foot abnormalities that were present from birth (congenital abnormalities), such as having a nail that is too big for your toe. What increases the risk? The following factors may make you more likely to develop ingrown toenails:  Age. Nails tend to get thicker with age, so ingrown nails are more common among older people.  Cutting your toenails incorrectly, such as cutting them very short or cutting them unevenly. An ingrown toenail is more likely to get infected if you have:  Diabetes.  Blood flow (circulation) problems. What are the signs or symptoms? Symptoms of an ingrown toenail may include:  Pain, soreness, or tenderness.  Redness.  Swelling.  Hardening of the skin that surrounds the toenail. Signs that an ingrown toenail may be infected include:  Fluid or pus.  Symptoms that get worse instead of better. How is this diagnosed? An ingrown toenail may be diagnosed based on your medical history, your symptoms, and a physical exam. If you have fluid or blood coming from your toenail, a sample may be collected to test for the specific type of bacteria that is causing the infection. How is this treated? Treatment depends on how severe your ingrown toenail is. You may be able to care for your toenail at home.  If you have an infection, you may be prescribed antibiotic medicines.  If you have fluid or pus draining from your toenail, your health care provider may drain it.  If you have trouble walking, you may be given crutches to use.  If you have a severe or infected ingrown toenail, you may need a procedure to remove part or all of the nail. Follow these instructions at  home: Foot care   Do not pick at your toenail or try to remove it yourself.  Soak your foot in warm, soapy water. Do this for 20 minutes, 3 times a day, or as often as told by your health care provider. This helps to keep your toe clean and keep your skin soft.  Wear shoes that fit well and are not too tight. Your health care provider may recommend that you wear open-toed shoes while you heal.  Trim your toenails regularly and carefully. Cut your toenails straight across to prevent injury to the skin at the corners of the toenail. Do not cut your nails in a curved shape.  Keep your feet clean and dry to help prevent infection. Medicines  Take over-the-counter and prescription medicines only as told by your  health care provider.  If you were prescribed an antibiotic, take it as told by your health care provider. Do not stop taking the antibiotic even if you start to feel better. Activity  Return to your normal activities as told by your health care provider. Ask your health care provider what activities are safe for you.  Avoid activities that cause pain. General instructions  If your health care provider told you to use crutches to help you move around, use them as instructed.  Keep all follow-up visits as told by your health care provider. This is important. Contact a health care provider if:  You have more redness, swelling, pain, or other symptoms that do not improve with treatment.  You have fluid, blood, or pus coming from your toenail. Get help right away if:  You have a red streak on your skin that starts at your foot and spreads up your leg.  You have a fever. Summary  An ingrown toenail occurs when the corner or sides of a toenail grow into the surrounding skin. This causes discomfort and pain. The big toe is most commonly affected, but any of the toes can be affected.  If an ingrown toenail is not treated, it can become infected.  Fluid or pus draining from your  toenail is a sign of infection. Your health care provider may need to drain it. You may be given antibiotics to treat the infection.  Trimming your toenails regularly and properly can help you prevent an ingrown toenail. This information is not intended to replace advice given to you by your health care provider. Make sure you discuss any questions you have with your health care provider. Document Released: 09/01/2000 Document Revised: 12/27/2018 Document Reviewed: 05/23/2017 Elsevier Patient Education  2020 [employer].

## 2019-04-25 NOTE — Progress Notes (Signed)
[name] presents today for painful right great toe. She was last seen on 04/07/2019 and was found to have painful mycotic toenails.  Today, her symptoms include redness localized to right hallux. Patient denies chills and fever greater than 100.   The following portions of the patient's history were reviewed and updated as appropriate: allergies, current medications, past family history, past medical history, past social history, past surgical history and problem list.   Review of Systems  Pertinent items are noted in HPI.   Objective:   General appearance: alert and cooperative.  Vascular Examination: Capillary refill time immediate x 10 digits.  Dorsalis Pedis pulses palpable b/l.  Posterior Tibial pulses palpable b/l.  Digital hair absent x 10 digits.  Skin temperature gradient WNL b/l.  Dermatological Examination:  Pedal skin consistent with chronic lymphedema.  Right great toe with incurvated nailplate medial  border with erythema distal to IPJ. No edema present. There is a scant amount of seropurulent drainage. No deep space abscess. No odor.   Musculoskeletal Examination: Muscle strength 5/5 to all muscle groups b/l.  Hammertoes 1-4 b/l.  Neurological Examination: Sensation intact 5/5 b/l with 10 gram monofilament.  Assessment:    Cellulitis/Abscess of the right hallux secondary to ingrown toenail.   Plan:   1.  Offending nail border debrided and curretaged with evacuation of abscess. Border irrigated with alcohol. Lumicain Hemostatic Solution applied to border. Triple antibiotic ointment and bandaid applied. 2.  Prescription written for Keflex 500 mg po qid x 5 days.  3.  Start epsom salt/warm water soaks once daily for 10 days. She is to apply Mupirocin Ointment to right great toe after soaks.  4.  Follow up one week for re-eval of right great toe. 5.  Call office if condition worsens.

## 2019-04-29 ENCOUNTER — Other Ambulatory Visit: Payer: Self-pay

## 2019-04-29 MED ORDER — ALENDRONATE SODIUM 70 MG PO TABS: ORAL_TABLET | ORAL | 5 refills | Status: DC

## 2019-05-02 ENCOUNTER — Encounter: Payer: Self-pay | Admitting: Podiatry

## 2019-05-02 ENCOUNTER — Ambulatory Visit (INDEPENDENT_AMBULATORY_CARE_PROVIDER_SITE_OTHER): Payer: Medicare Other | Admitting: Podiatry

## 2019-05-02 ENCOUNTER — Other Ambulatory Visit: Payer: Self-pay

## 2019-05-02 VITALS — Temp 97.2°F

## 2019-05-02 DIAGNOSIS — L600 Ingrowing nail: Secondary | ICD-10-CM

## 2019-05-02 DIAGNOSIS — L02611 Cutaneous abscess of right foot: Secondary | ICD-10-CM

## 2019-05-02 DIAGNOSIS — L03031 Cellulitis of right toe: Secondary | ICD-10-CM

## 2019-05-02 NOTE — Patient Instructions (Addendum)
CONTINUE TO APPLY ANTIBIOTIC OINTMENT TO BOTH GREAT TOES FOR ONE WEEK   Diabetes Mellitus and Foot Care Foot care is an important part of your health, especially when you have diabetes. Diabetes may cause you to have problems because of poor blood flow (circulation) to your feet and legs, which can cause your skin to:  Become thinner and drier.  Break more easily.  Heal more slowly.  Peel and crack. You may also have nerve damage (neuropathy) in your legs and feet, causing decreased feeling in them. This means that you may not notice minor injuries to your feet that could lead to more serious problems. Noticing and addressing any potential problems early is the best way to prevent future foot problems. How to care for your feet Foot hygiene  Wash your feet daily with warm water and mild soap. Do not use hot water. Then, pat your feet and the areas between your toes until they are completely dry. Do not soak your feet as this can dry your skin.  Trim your toenails straight across. Do not dig under them or around the cuticle. File the edges of your nails with an emery board or nail file.  Apply a moisturizing lotion or petroleum jelly to the skin on your feet and to dry, brittle toenails. Use lotion that does not contain alcohol and is unscented. Do not apply lotion between your toes. Shoes and socks  Wear clean socks or stockings every day. Make sure they are not too tight. Do not wear knee-high stockings since they may decrease blood flow to your legs.  Wear shoes that fit properly and have enough cushioning. Always look in your shoes before you put them on to be sure there are no objects inside.  To break in new shoes, wear them for just a few hours a day. This prevents injuries on your feet. Wounds, scrapes, corns, and calluses  Check your feet daily for blisters, cuts, bruises, sores, and redness. If you cannot see the bottom of your feet, use a mirror or ask someone for help.  Do  not cut corns or calluses or try to remove them with medicine.  If you find a minor scrape, cut, or break in the skin on your feet, keep it and the skin around it clean and dry. You may clean these areas with mild soap and water. Do not clean the area with peroxide, alcohol, or iodine.  If you have a wound, scrape, corn, or callus on your foot, look at it several times a day to make sure it is healing and not infected. Check for: ? Redness, swelling, or pain. ? Fluid or blood. ? Warmth. ? Pus or a bad smell. General instructions  Do not cross your legs. This may decrease blood flow to your feet.  Do not use heating pads or hot water bottles on your feet. They may burn your skin. If you have lost feeling in your feet or legs, you may not know this is happening until it is too late.  Protect your feet from hot and cold by wearing shoes, such as at the beach or on hot pavement.  Schedule a complete foot exam at least once a year (annually) or more often if you have foot problems. If you have foot problems, report any cuts, sores, or bruises to your health care provider immediately. Contact a health care provider if:  You have a medical condition that increases your risk of infection and you have any cuts,  sores, or bruises on your feet.  You have an injury that is not healing.  You have redness on your legs or feet.  You feel burning or tingling in your legs or feet.  You have pain or cramps in your legs and feet.  Your legs or feet are numb.  Your feet always feel cold.  You have pain around a toenail. Get help right away if:  You have a wound, scrape, corn, or callus on your foot and: ? You have pain, swelling, or redness that gets worse. ? You have fluid or blood coming from the wound, scrape, corn, or callus. ? Your wound, scrape, corn, or callus feels warm to the touch. ? You have pus or a bad smell coming from the wound, scrape, corn, or callus. ? You have a fever. ? You  have a red line going up your leg. Summary  Check your feet every day for cuts, sores, red spots, swelling, and blisters.  Moisturize feet and legs daily.  Wear shoes that fit properly and have enough cushioning.  If you have foot problems, report any cuts, sores, or bruises to your health care provider immediately.  Schedule a complete foot exam at least once a year (annually) or more often if you have foot problems. This information is not intended to replace advice given to you by your health care provider. Make sure you discuss any questions you have with your health care provider. Document Released: 09/01/2000 Document Revised: 10/17/2017 Document Reviewed: 10/06/2016 Elsevier Patient Education  2020 [employer].

## 2019-05-02 NOTE — Progress Notes (Signed)
Subjective: Julie Mora presents today for followup cellulitis right hallux. Patient's sister is present during the visit. Her sister has been helping her with her daily epsom salt soaks and treatment of the toe. They deny any increased redness, swelling, drainage or pain.   Since her last visit, Julie Mora had a blister rupture on her right lower leg. Her sister has been keeping it clean and applying vaseline petroleum jelly daily.  Julie Mora says she has one cephalexin capsule left.  Elby Showers, MD is her PCP.   Current Outpatient Medications:  .  ACCU-CHEK AVIVA PLUS test strip, USE TO CHECK BLOOD SUGAR BEFORE BREAKFAST AND SUPPER, Disp: 50 each, Rfl: prn .  acetaminophen (TYLENOL) 500 MG tablet, Take 500 mg by mouth every 6 (six) hours as needed for pain., Disp: , Rfl:  .  alendronate (FOSAMAX) 70 MG tablet, TAKE 1 TABLET(70 MG) BY MOUTH EVERY 7 DAYS WITH A FULL GLASS OF WATER AND ON AN EMPTY STOMACH, Disp: 4 tablet, Rfl: 5 .  aspirin EC 81 MG tablet, Take by mouth., Disp: , Rfl:  .  BD PEN NEEDLE NANO U/F 32G X 4 MM MISC, USE AS DIRECTED, Disp: 100 each, Rfl: prn .  CALCIUM ASCORBATE PO, Take 1 tablet by mouth daily., Disp: , Rfl:  .  Cholecalciferol (VITAMIN D) 2000 UNITS tablet, Take 2,000 Units by mouth daily.  , Disp: , Rfl:  .  clotrimazole-betamethasone (LOTRISONE) lotion, Apply to both feet twice daily, Disp: 30 mL, Rfl: 2 .  ferrous gluconate (FERGON) 324 MG tablet, TAKE 1 TABLET(324 MG) BY MOUTH DAILY WITH BREAKFAST, Disp: 90 tablet, Rfl: 3 .  fludrocortisone (FLORINEF) 0.1 MG tablet, , Disp: , Rfl:  .  gabapentin (NEURONTIN) 300 MG capsule, TK ONE C PO  HS, Disp: , Rfl:  .  insulin aspart (NOVOLOG) 100 UNIT/ML injection, Inject 5-12 Units into the skin 2 (two) times daily as needed for high blood sugar. (Patient taking differently: Inject 4-14 Units into the skin 3 (three) times daily with meals. ), Disp: 3 vial, Rfl: PRN .  Insulin Glargine (LANTUS SOLOSTAR) 100 UNIT/ML  Solostar Pen, Inject 35 units in skin at bedtime (Patient taking differently: Inject 20-45 Units into the skin 2 (two) times daily. 20 units in the AM and  45 units in the PM), Disp: 15 mL, Rfl: prn .  insulin lispro (HUMALOG) 100 UNIT/ML KiwkPen, , Disp: , Rfl:  .  Lactobacillus Rhamnosus, GG, (CULTURELLE PO), Take 1 capsule by mouth daily., Disp: , Rfl:  .  metFORMIN (GLUCOPHAGE) 500 MG tablet, , Disp: , Rfl:  .  multivitamin (THERAGRAN) per tablet, Take 1 tablet by mouth daily.  , Disp: , Rfl:  .  mupirocin ointment (BACTROBAN) 2 %, Apply to right great toe with band-aids once daily after soaks., Disp: 22 g, Rfl: 0 .  MYRBETRIQ 50 MG TB24 tablet, TK 1 T PO QD, Disp: , Rfl:  .  nystatin cream (MYCOSTATIN), Apply 1 application topically 2 (two) times daily., Disp: 30 g, Rfl: 0 .  oxybutynin (DITROPAN-XL) 10 MG 24 hr tablet, , Disp: , Rfl:  .  Polyethyl Glycol-Propyl Glycol (SYSTANE) 0.4-0.3 % SOLN, Apply 1 drop to eye 3 (three) times daily. Both eyes, as needed, Disp: , Rfl:  .  potassium chloride (K-DUR) 10 MEQ tablet, TAKE 1 TABLET(10 MEQ) BY MOUTH DAILY, Disp: 90 tablet, Rfl: 0 .  potassium chloride (K-DUR,KLOR-CON) 10 MEQ tablet, , Disp: , Rfl:  .  PRADAXA 75 MG CAPS capsule,  TAKE 1 CAPSULE(75 MG) BY MOUTH TWICE DAILY, Disp: 60 capsule, Rfl: 5 .  prednisoLONE acetate (PRED FORTE) 1 % ophthalmic suspension, Place 1 drop into both eyes 2 (two) times daily. , Disp: , Rfl:  .  silver sulfADIAZINE (SILVADENE) 1 % cream, , Disp: , Rfl:  .  simvastatin (ZOCOR) 20 MG tablet, TAKE 1 TABLET BY MOUTH EVERY NIGHT AT BEDTIME, Disp: 90 tablet, Rfl: 3 .  tacrolimus (PROTOPIC) 0.1 % ointment, , Disp: , Rfl:  .  tolterodine (DETROL LA) 4 MG 24 hr capsule, , Disp: , Rfl:  .  torsemide (DEMADEX) 20 MG tablet, TAKE 1 TABLET(20 MG) BY MOUTH DAILY, Disp: 90 tablet, Rfl: 3 .  triamcinolone cream (KENALOG) 0.1 %, APP AA QD UTD, Disp: , Rfl:   Allergies  Allergen Reactions  . Azathioprine Shortness Of Breath and  Other (See Comments)    severly allergic. nausea, diarrhea, low blood pressure, fever, chills, rash. was hospitalized due to side effects in feb 2012.  . Cellcept [Mycophenolate Mofetil] Nausea Only    severe nausea  . Ramipril Cough    Objective: Vitals:   05/02/19 0835  Temp: (!) 97.2 F (36.2 C)    Vascular Examination: Capillary refill time immediate x 10 digits.  Dorsalis pedis and Posterior tibial pulses palpable b/l.  Digital hair absent x 10 digits.  Skin temperature gradient WNL b/l.  Dermatological Examination: Pedal skin and both lower extremities noted to be consistent with chronic lymphedema b/l.  Nickel size superficial wound right leg with pink base and macerated edges. No purulence, no surrounding erythema, no odor.  Right hallux with resolved erythema. No purulence, no edema, no warmth.  Musculoskeletal: Muscle strength 5/5 to all LE muscle groups b/l.  Hammertoes 1-4 b/l.  No pain, crepitus or joint limitation noted with ROM.   Neurological: Sensation intact with 10 gram monofilament.  Assessment: Resolved cellulitis/abscess right hallux secondary to ingrown toenail  Plan: 1. Discontinue epsom salt soaks. Apply Mupirocin ointment to both great toes once daily for one week. 2. As far as her leg goes, she just completed a course of Keflex for her toe and there is no cellulitis of her legs. Advised her and her sister they may request a video visit with her PCP if her leg wound does not heal. 3. Patient to continue soft, supportive shoe gear daily. 4. Patient to report any pedal injuries to medical professional immediately. 5. She is to keep her follow up appointment for her routine nail care in October. 6. Patient/POA to call should there be a concern in the interim.

## 2019-05-12 NOTE — Telephone Encounter (Signed)
Pt CB and scheduled

## 2019-05-14 ENCOUNTER — Other Ambulatory Visit: Payer: Self-pay | Admitting: Urology

## 2019-05-14 DIAGNOSIS — D3000 Benign neoplasm of unspecified kidney: Secondary | ICD-10-CM

## 2019-06-02 ENCOUNTER — Other Ambulatory Visit: Payer: Self-pay

## 2019-06-02 ENCOUNTER — Ambulatory Visit: Payer: Medicare Other | Admitting: Orthotics

## 2019-06-02 DIAGNOSIS — E1159 Type 2 diabetes mellitus with other circulatory complications: Secondary | ICD-10-CM

## 2019-06-02 DIAGNOSIS — M2041 Other hammer toe(s) (acquired), right foot: Secondary | ICD-10-CM

## 2019-06-02 DIAGNOSIS — L600 Ingrowing nail: Secondary | ICD-10-CM

## 2019-06-02 DIAGNOSIS — B351 Tinea unguium: Secondary | ICD-10-CM

## 2019-06-02 DIAGNOSIS — L02611 Cutaneous abscess of right foot: Secondary | ICD-10-CM

## 2019-06-02 DIAGNOSIS — M2042 Other hammer toe(s) (acquired), left foot: Secondary | ICD-10-CM

## 2019-06-02 NOTE — Progress Notes (Signed)

## 2019-06-12 ENCOUNTER — Other Ambulatory Visit: Payer: Medicare Other

## 2019-06-17 ENCOUNTER — Telehealth: Payer: Self-pay | Admitting: Internal Medicine

## 2019-06-17 MED ORDER — POTASSIUM CHLORIDE ER 10 MEQ PO TBCR: EXTENDED_RELEASE_TABLET | ORAL | 0 refills | Status: DC

## 2019-06-17 NOTE — Addendum Note (Signed)
Addended by: Mady Haagensen on: 06/17/2019 09:56 AM   Modules accepted: Orders

## 2019-06-17 NOTE — Telephone Encounter (Signed)
Incoming fax from [facility], [place], [id number] Potassium, CL 55meq ER tablets Last refilled 03/19/19 Last ov: 04/01/19 Last CPE: 06/18/18 Next CPE: 07/31/19

## 2019-06-28 IMAGING — MR MR ABDOMEN WO/W CM
11 of 17 series · 28 of 48 positions shown · IV contrast (multihance)
Comparison: MRI 03/20/2016 and 12/09/2014.

CLINICAL DATA: Indeterminate cystic left renal lesion. No history
of malignancy. Creatinine was obtained on site at [HOSPITAL]
at [HOSPITAL].Results: Creatinine 0.8 mg/dL.

EXAM:
MRI ABDOMEN WITHOUT AND WITH CONTRAST
TECHNIQUE: Multiplanar multisequence MR imaging of the abdomen was performed
both before and after the administration of intravenous contrast.
CONTRAST:  20mL MULTIHANCE GADOBENATE DIMEGLUMINE 529 MG/ML IV SOLN

[Series 3: T2 · coronal · 5.0mm · 1.56mm/px · 2 of 23 slices shown (1 of 3)]
[im 1/23]
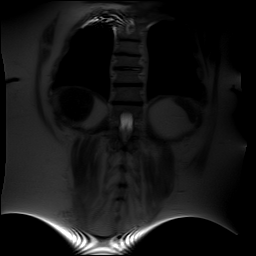
[im 23/23]
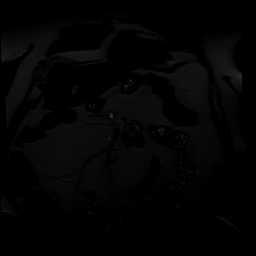

[Series 4: axial tru fisp · axial · 4.0mm · 1.64mm/px · z∈[-137,+81]mm · 2 of 43 slices shown]
[im 1/43]
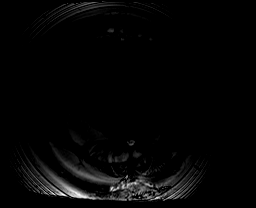
[im 43/43]
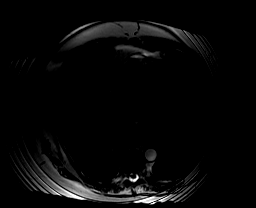

[Series 5: T2 · axial · 6.5mm · 0.78mm/px · z∈[-126,+123]mm · 2 of 33 slices shown (2 of 3)]
[im 1/33]
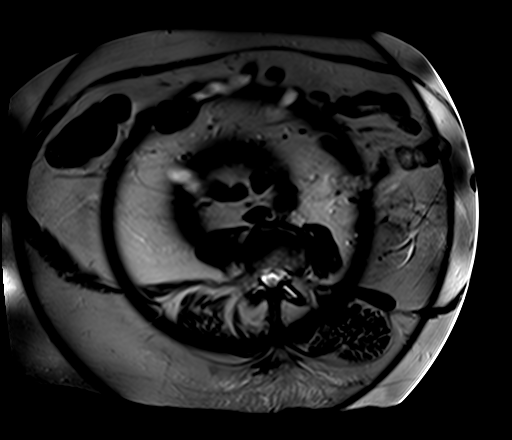
[im 33/33]
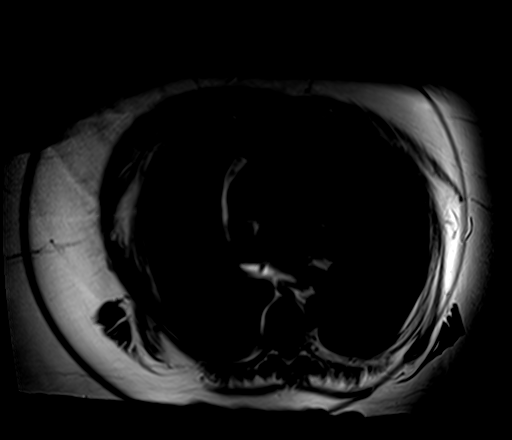

[Series 6: ep2d_diff_b50_500_800_p2 · axial · 6.0mm · 2.19mm/px · z∈[-129,+121]mm · 5 of 99 slices shown]
[im 1/99]
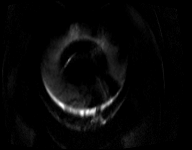
[im 25/99]
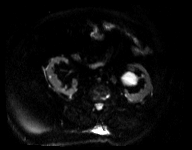
[im 50/99]
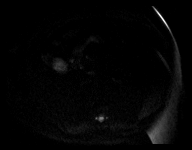
[im 74/99]
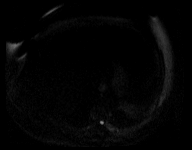
[im 99/99]
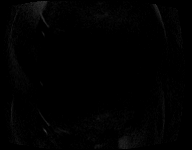

[Series 7: [id number]_[id number]_adc · axial · 6.0mm · 2.19mm/px · z∈[-129,+121]mm · 2 of 33 slices shown]
[im 1/33]
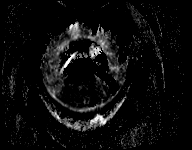
[im 33/33]
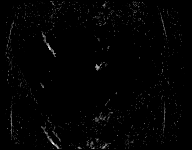

[Series 8: T2 · axial · 5.0mm · 1.56mm/px · z∈[-150,+84]mm · 2 of 37 slices shown (3 of 3)]
[im 1/37]
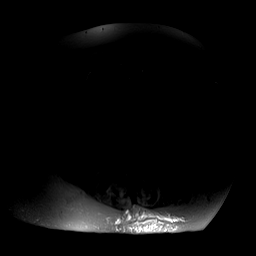
[im 37/37]
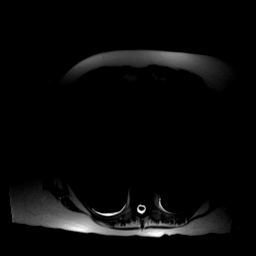

[Series 9: axial in out · axial · 5.5mm · 0.74mm/px · z∈[-140,+75]mm · 3 of 70 slices shown]
[im 1/70]
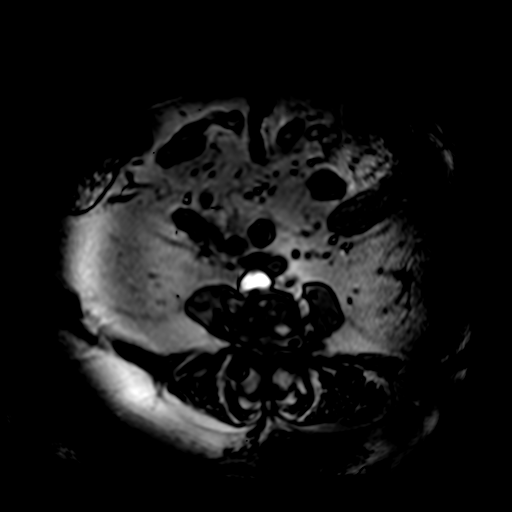
[im 35/70]
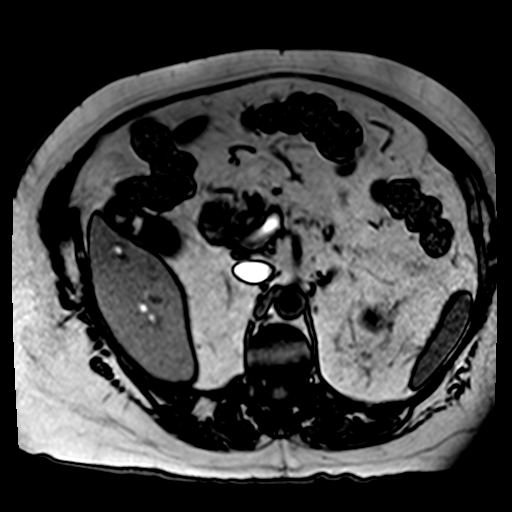
[im 70/70]
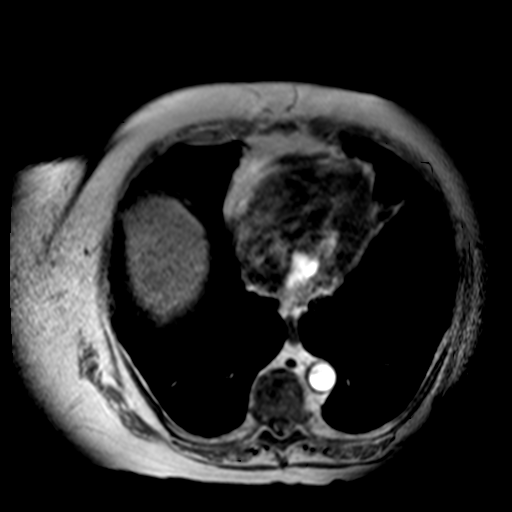

[Series 10: T1 dynamic · axial · non-contrast · 2.5mm · 0.78mm/px · z∈[-143,+35]mm · 3 of 72 slices shown]
[im 1/72]
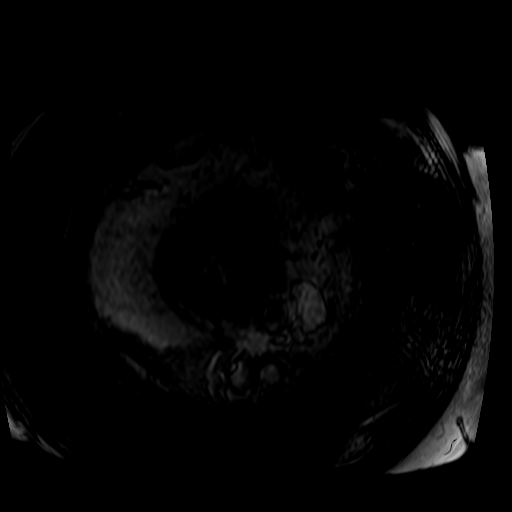
[im 36/72]
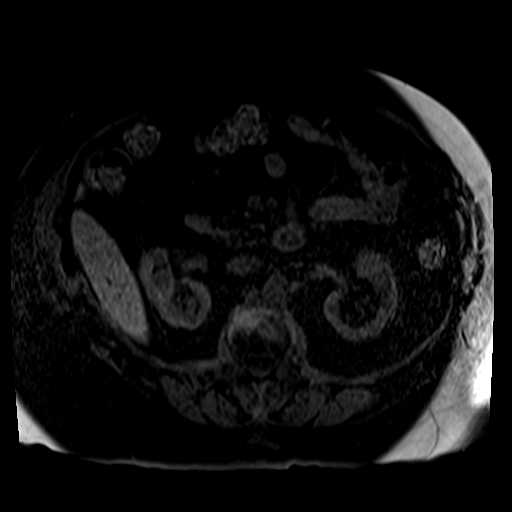
[im 72/72]
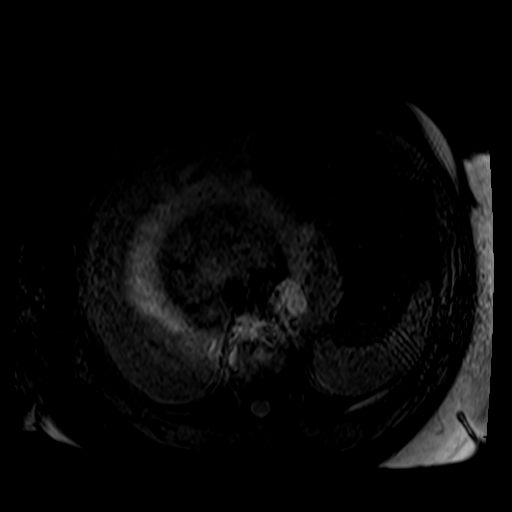

[Series 11: post 25 sec · axial · 2.5mm · 0.78mm/px · z∈[-143,+35]mm · 3 of 72 slices shown]
[im 1/72]
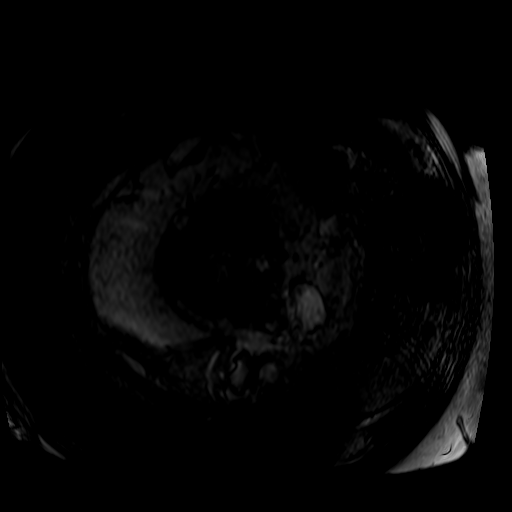
[im 36/72]
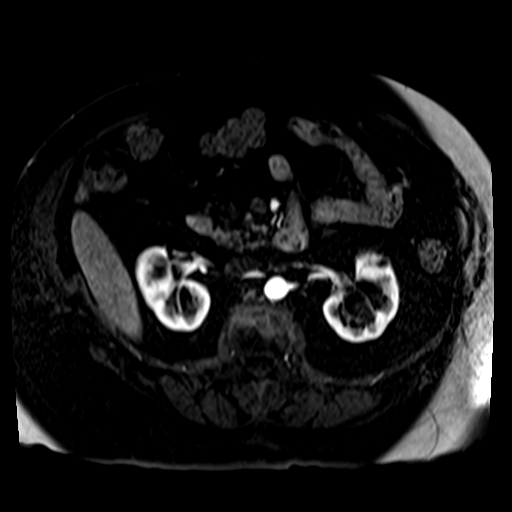
[im 72/72]
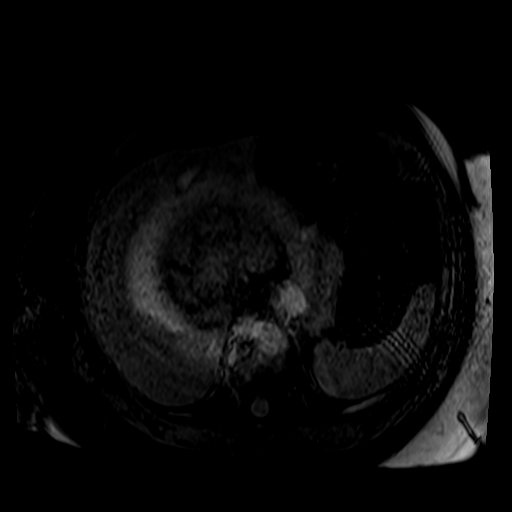

[Series 12: post 25 sec_sub · axial · 2.5mm · 0.78mm/px · z∈[-143,+35]mm · 3 of 72 slices shown]
[im 1/72]
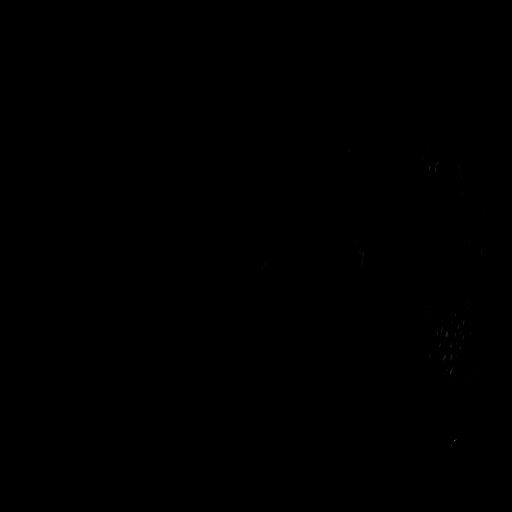
[im 36/72]
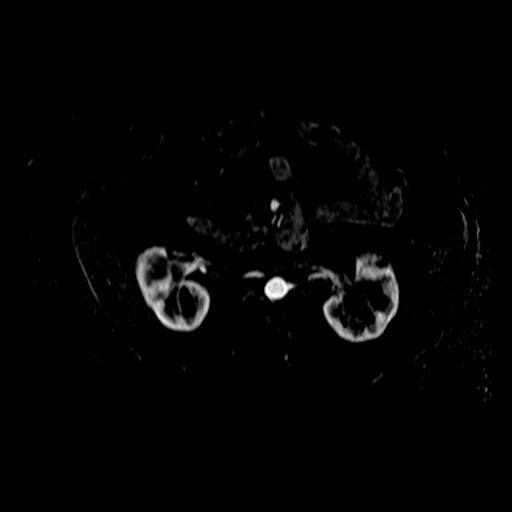
[im 72/72]
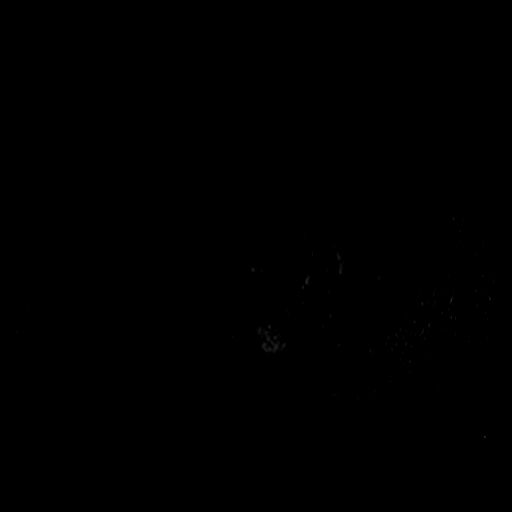

[Series 13: post 45 sec · axial · 2.5mm · 0.78mm/px · 1 of 72 slices shown]
[im 1/72]
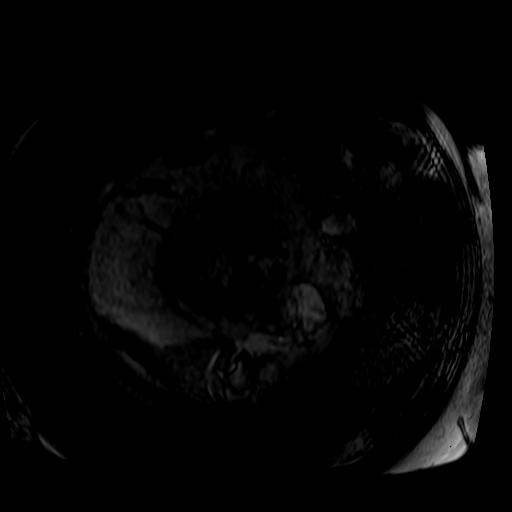

[28 of 48 positions shown; findings below may reference images not displayed]

FINDINGS: Lower chest:  The visualized lower chest appears unremarkable.

Hepatobiliary: The visualized liver demonstrates no focal lesion or
abnormal enhancement. Multiple small gallstones are again noted.
There is no gallbladder wall thickening or biliary dilatation. Small
filling defects remain within the common bile duct, suspicious for
choledocholithiasis, similar to previous study.

Pancreas: The pancreas is atrophied. Septated cystic lesion
inferiorly in the pancreatic head appears unchanged, measuring up to
1.7 cm (image 24 of series 8). There are additional tiny cystic
lesions anterior to the pancreatic body and within the pancreatic
tail. No pancreatic ductal dilatation or surrounding inflammation.

Spleen: Normal in size without focal abnormality.

Adrenals/Urinary Tract: Both adrenal glands appear normal. The
thinly septated cystic lesion in the lower pole of the left kidney
is unchanged in appearance. It measures slightly smaller than on the
most recent study, now 5.6 x 4.8 x 4.5 cm (previously 5.9 x 5.3 x
4.4 cm). Again, this lesion demonstrates no solid components. There
are additional tiny cystic lesions in both kidneys. No
hydronephrosis.

Stomach/Bowel: No evidence of bowel wall thickening, distention or
surrounding inflammatory change.

Vascular/Lymphatic: There are no enlarged abdominal lymph nodes. No
significant vascular findings are present.

Other: No ascites.

Musculoskeletal: No acute or significant osseous findings. Stable
lumbar spondylosis.
IMPRESSION: 1. Stable complex cystic lesion in the lower pole of the left kidney
(slightly smaller than on most recent study). This lesion now has
not substantially changed from the baseline MRI of 03/27/2014. This
is still considered a Bosniak 2F lesion, and follow-up in 1-2 years
recommended (until stability over 5 years documented). This
recommendation follows ACR consensus guidelines: Management of the
Incidental Renal Mass on CT: A White Paper of the ACR Incidental
Findings Committee. [HOSPITAL] 4915;[DATE].
2. Stable small cystic lesions within the pancreas, likely indolent
cystic neoplasms.
3. Cholelithiasis and probable choledocholithiasis as on prior
studies.

## 2019-07-02 ENCOUNTER — Encounter: Payer: Self-pay | Admitting: Internal Medicine

## 2019-07-02 LAB — HM MAMMOGRAPHY

## 2019-07-03 ENCOUNTER — Encounter: Payer: Self-pay | Admitting: Internal Medicine

## 2019-07-05 ENCOUNTER — Other Ambulatory Visit: Payer: Medicare Other

## 2019-07-14 ENCOUNTER — Encounter: Payer: Self-pay | Admitting: Podiatry

## 2019-07-14 ENCOUNTER — Ambulatory Visit (INDEPENDENT_AMBULATORY_CARE_PROVIDER_SITE_OTHER): Payer: Medicare Other | Admitting: Podiatry

## 2019-07-14 ENCOUNTER — Other Ambulatory Visit: Payer: Self-pay

## 2019-07-14 DIAGNOSIS — E1159 Type 2 diabetes mellitus with other circulatory complications: Secondary | ICD-10-CM | POA: Diagnosis not present

## 2019-07-14 DIAGNOSIS — L600 Ingrowing nail: Secondary | ICD-10-CM

## 2019-07-14 DIAGNOSIS — B351 Tinea unguium: Secondary | ICD-10-CM | POA: Diagnosis not present

## 2019-07-14 DIAGNOSIS — M79675 Pain in left toe(s): Secondary | ICD-10-CM

## 2019-07-14 DIAGNOSIS — M79674 Pain in right toe(s): Secondary | ICD-10-CM | POA: Diagnosis not present

## 2019-07-14 NOTE — Patient Instructions (Signed)
Diabetes Mellitus and Foot Care Foot care is an important part of your health, especially when you have diabetes. Diabetes may cause you to have problems because of poor blood flow (circulation) to your feet and legs, which can cause your skin to:  Become thinner and drier.  Break more easily.  Heal more slowly.  Peel and crack. You may also have nerve damage (neuropathy) in your legs and feet, causing decreased feeling in them. This means that you may not notice minor injuries to your feet that could lead to more serious problems. Noticing and addressing any potential problems early is the best way to prevent future foot problems. How to care for your feet Foot hygiene  Wash your feet daily with warm water and mild soap. Do not use hot water. Then, pat your feet and the areas between your toes until they are completely dry. Do not soak your feet as this can dry your skin.  Trim your toenails straight across. Do not dig under them or around the cuticle. File the edges of your nails with an emery board or nail file.  Apply a moisturizing lotion or petroleum jelly to the skin on your feet and to dry, brittle toenails. Use lotion that does not contain alcohol and is unscented. Do not apply lotion between your toes. Shoes and socks  Wear clean socks or stockings every day. Make sure they are not too tight. Do not wear knee-high stockings since they may decrease blood flow to your legs.  Wear shoes that fit properly and have enough cushioning. Always look in your shoes before you put them on to be sure there are no objects inside.  To break in new shoes, wear them for just a few hours a day. This prevents injuries on your feet. Wounds, scrapes, corns, and calluses  Check your feet daily for blisters, cuts, bruises, sores, and redness. If you cannot see the bottom of your feet, use a mirror or ask someone for help.  Do not cut corns or calluses or try to remove them with medicine.  If you  find a minor scrape, cut, or break in the skin on your feet, keep it and the skin around it clean and dry. You may clean these areas with mild soap and water. Do not clean the area with peroxide, alcohol, or iodine.  If you have a wound, scrape, corn, or callus on your foot, look at it several times a day to make sure it is healing and not infected. Check for: ? Redness, swelling, or pain. ? Fluid or blood. ? Warmth. ? Pus or a bad smell. General instructions  Do not cross your legs. This may decrease blood flow to your feet.  Do not use heating pads or hot water bottles on your feet. They may burn your skin. If you have lost feeling in your feet or legs, you may not know this is happening until it is too late.  Protect your feet from hot and cold by wearing shoes, such as at the beach or on hot pavement.  Schedule a complete foot exam at least once a year (annually) or more often if you have foot problems. If you have foot problems, report any cuts, sores, or bruises to your health care provider immediately. Contact a health care provider if:  You have a medical condition that increases your risk of infection and you have any cuts, sores, or bruises on your feet.  You have an injury that is not   healing.  You have redness on your legs or feet.  You feel burning or tingling in your legs or feet.  You have pain or cramps in your legs and feet.  Your legs or feet are numb.  Your feet always feel cold.  You have pain around a toenail. Get help right away if:  You have a wound, scrape, corn, or callus on your foot and: ? You have pain, swelling, or redness that gets worse. ? You have fluid or blood coming from the wound, scrape, corn, or callus. ? Your wound, scrape, corn, or callus feels warm to the touch. ? You have pus or a bad smell coming from the wound, scrape, corn, or callus. ? You have a fever. ? You have a red line going up your leg. Summary  Check your feet every day  for cuts, sores, red spots, swelling, and blisters.  Moisturize feet and legs daily.  Wear shoes that fit properly and have enough cushioning.  If you have foot problems, report any cuts, sores, or bruises to your health care provider immediately.  Schedule a complete foot exam at least once a year (annually) or more often if you have foot problems. This information is not intended to replace advice given to you by your health care provider. Make sure you discuss any questions you have with your health care provider. Document Released: 09/01/2000 Document Revised: 10/17/2017 Document Reviewed: 10/06/2016 Elsevier Patient Education  2020 [employer]vier Inc.  

## 2019-07-14 NOTE — Progress Notes (Signed)
Subjective: Julie Mora is a 78 y.o. y.o. female who presents today for preventative diabetic foot care with cc of painful, discolored, thick toenails. Pain is aggravated when wearing enclosed shoe gear and relieved with periodic professional debridement.  She voices no new pedal concerns on today's visit.  Current Outpatient Medications on File Prior to Visit  Medication Sig Dispense Refill  . ACCU-CHEK AVIVA PLUS test strip USE TO CHECK BLOOD SUGAR BEFORE BREAKFAST AND SUPPER 50 each prn  . acetaminophen (TYLENOL) 500 MG tablet Take 500 mg by mouth every 6 (six) hours as needed for pain.    Marland Kitchen alendronate (FOSAMAX) 70 MG tablet TAKE 1 TABLET(70 MG) BY MOUTH EVERY 7 DAYS WITH A FULL GLASS OF WATER AND ON AN EMPTY STOMACH 4 tablet 5  . aspirin EC 81 MG tablet Take by mouth.    . BD PEN NEEDLE NANO U/F 32G X 4 MM MISC USE AS DIRECTED 100 each prn  . CALCIUM ASCORBATE PO Take 1 tablet by mouth daily.    . Cholecalciferol (VITAMIN D) 2000 UNITS tablet Take 2,000 Units by mouth daily.      . clotrimazole-betamethasone (LOTRISONE) lotion Apply to both feet twice daily 30 mL 2  . ferrous gluconate (FERGON) 324 MG tablet TAKE 1 TABLET(324 MG) BY MOUTH DAILY WITH BREAKFAST 90 tablet 3  . fludrocortisone (FLORINEF) 0.1 MG tablet     . gabapentin (NEURONTIN) 100 MG capsule Take 100 mg by mouth daily.    Marland Kitchen gabapentin (NEURONTIN) 300 MG capsule TK ONE C PO  HS    . insulin aspart (NOVOLOG) 100 UNIT/ML injection Inject 5-12 Units into the skin 2 (two) times daily as needed for high blood sugar. (Patient taking differently: Inject 4-14 Units into the skin 3 (three) times daily with meals. ) 3 vial PRN  . Insulin Glargine (LANTUS SOLOSTAR) 100 UNIT/ML Solostar Pen Inject 35 units in skin at bedtime (Patient taking differently: Inject 20-45 Units into the skin 2 (two) times daily. 20 units in the AM and  45 units in the PM) 15 mL prn  . insulin lispro (HUMALOG) 100 UNIT/ML KiwkPen     . Lactobacillus Rhamnosus,  GG, (CULTURELLE PO) Take 1 capsule by mouth daily.    . metFORMIN (GLUCOPHAGE) 500 MG tablet     . multivitamin (THERAGRAN) per tablet Take 1 tablet by mouth daily.      . mupirocin ointment (BACTROBAN) 2 % Apply to right great toe with band-aids once daily after soaks. 22 g 0  . MYRBETRIQ 50 MG TB24 tablet TK 1 T PO QD    . nystatin cream (MYCOSTATIN) Apply 1 application topically 2 (two) times daily. 30 g 0  . oxybutynin (DITROPAN-XL) 10 MG 24 hr tablet     . Polyethyl Glycol-Propyl Glycol (SYSTANE) 0.4-0.3 % SOLN Apply 1 drop to eye 3 (three) times daily. Both eyes, as needed    . potassium chloride (K-DUR) 10 MEQ tablet TAKE 1 TABLET(10 MEQ) BY MOUTH DAILY 90 tablet 0  . potassium chloride (K-DUR,KLOR-CON) 10 MEQ tablet     . PRADAXA 75 MG CAPS capsule TAKE 1 CAPSULE(75 MG) BY MOUTH TWICE DAILY 60 capsule 5  . prednisoLONE acetate (PRED FORTE) 1 % ophthalmic suspension Place 1 drop into both eyes 2 (two) times daily.     . silver sulfADIAZINE (SILVADENE) 1 % cream     . simvastatin (ZOCOR) 20 MG tablet TAKE 1 TABLET BY MOUTH EVERY NIGHT AT BEDTIME 90 tablet 3  . tacrolimus (PROTOPIC)  0.1 % ointment     . tolterodine (DETROL LA) 4 MG 24 hr capsule     . torsemide (DEMADEX) 20 MG tablet TAKE 1 TABLET(20 MG) BY MOUTH DAILY 90 tablet 3  . triamcinolone cream (KENALOG) 0.1 % APP AA QD UTD     No current facility-administered medications on file prior to visit.     Allergies  Allergen Reactions  . Azathioprine Shortness Of Breath and Other (See Comments)    severly allergic. nausea, diarrhea, low blood pressure, fever, chills, rash. was hospitalized due to side effects in feb 2012.  . Cellcept [Mycophenolate Mofetil] Nausea Only    severe nausea  . Ramipril Cough    Objective: Vascular Examination: Capillary refill time immediate b/l.  Dorsalis pedis pulses palpable b/l.  Posterior tibial pulses palpable b/l.  Digital hair absent b/l.  Skin temperature gradient WNL  b/l.  Lymphedema noted BLE. No cellulitis, no increased warmth, no blisters, no open wounds.   Dermatological Examination: Skin with normal turgor, texture and tone b/l.  No open wounds b/l.  Toenails 1-5 b/l discolored, thick, dystrophic with subungual debris and pain with palpation to nailbeds due to thickness of nails.  Incurvated nailplate b/l great toes with tenderness to palpation. No erythema, no edema, no drainage noted.  Musculoskeletal: Muscle strength 5/5 b/l to all LE muscle groups.  Hammertoes 1-4 b/l.  No pain, crepitus or joint discomfort with active/passive ROM.  Neurological: Sensation intact 5/5 b/l with 10 gram monofilament.  Vibratory sensation intact b/l.  Assessment: 1. Painful onychomycosis toenails 1-5 b/l 2.   NIDDM  Plan: 1. Continue diabetic foot care principles. Literature dispensed on today. 2. Toenails 1-5 b/l were debrided in length and girth without iatrogenic bleeding. Offending nail borders debrided and curretaged b/l great toes. Borders cleansed with alcohol. Antibiotic ointment applied. No further treatment required by patient. 3. Patient to continue soft, supportive shoe gear daily. 4. Patient to report any pedal injuries to medical professional immediately. 5. Follow up 3 months.  6. Patient/POA to call should there be a concern in the interim.

## 2019-07-17 ENCOUNTER — Other Ambulatory Visit: Payer: Self-pay | Admitting: Internal Medicine

## 2019-07-28 ENCOUNTER — Other Ambulatory Visit: Payer: Medicare Other | Admitting: Internal Medicine

## 2019-07-28 ENCOUNTER — Other Ambulatory Visit: Payer: Self-pay

## 2019-07-28 DIAGNOSIS — I10 Essential (primary) hypertension: Secondary | ICD-10-CM

## 2019-07-28 DIAGNOSIS — E8881 Metabolic syndrome: Secondary | ICD-10-CM

## 2019-07-28 DIAGNOSIS — Z9181 History of falling: Secondary | ICD-10-CM

## 2019-07-28 DIAGNOSIS — Z8673 Personal history of transient ischemic attack (TIA), and cerebral infarction without residual deficits: Secondary | ICD-10-CM

## 2019-07-28 DIAGNOSIS — E782 Mixed hyperlipidemia: Secondary | ICD-10-CM

## 2019-07-28 DIAGNOSIS — E1159 Type 2 diabetes mellitus with other circulatory complications: Secondary | ICD-10-CM

## 2019-07-28 DIAGNOSIS — M810 Age-related osteoporosis without current pathological fracture: Secondary | ICD-10-CM

## 2019-07-28 DIAGNOSIS — Z7901 Long term (current) use of anticoagulants: Secondary | ICD-10-CM

## 2019-07-28 DIAGNOSIS — Z0000 Encounter for general adult medical examination without abnormal findings: Secondary | ICD-10-CM

## 2019-07-29 LAB — TSH: TSH: 2.47 mIU/L (ref 0.40–4.50)

## 2019-07-29 LAB — COMPLETE METABOLIC PANEL WITH GFR
AG Ratio: 1.1 (calc) (ref 1.0–2.5)
ALT: 9 U/L (ref 6–29)
AST: 14 U/L (ref 10–35)
Albumin: 3.4 g/dL — ABNORMAL LOW (ref 3.6–5.1)
Alkaline phosphatase (APISO): 73 U/L (ref 37–153)
BUN/Creatinine Ratio: 16 (calc) (ref 6–22)
BUN: 19 mg/dL (ref 7–25)
CO2: 24 mmol/L (ref 20–32)
Calcium: 9.3 mg/dL (ref 8.6–10.4)
Chloride: 105 mmol/L (ref 98–110)
Creat: 1.16 mg/dL — ABNORMAL HIGH (ref 0.60–0.93)
GFR, Est African American: 53 mL/min/{1.73_m2} — ABNORMAL LOW (ref >=60)
GFR, Est Non African American: 45 mL/min/{1.73_m2} — ABNORMAL LOW (ref >=60)
Globulin: 3.2 g/dL (calc) (ref 1.9–3.7)
Glucose, Bld: 185 mg/dL — ABNORMAL HIGH (ref 65–99)
Potassium: 4.2 mmol/L (ref 3.5–5.3)
Sodium: 140 mmol/L (ref 135–146)
Total Bilirubin: 0.4 mg/dL (ref 0.2–1.2)
Total Protein: 6.6 g/dL (ref 6.1–8.1)

## 2019-07-29 LAB — CBC WITH DIFFERENTIAL/PLATELET
Absolute Monocytes: 749 cells/uL (ref 200–950)
Basophils Absolute: 31 cells/uL (ref 0–200)
Basophils Relative: 0.3 %
Eosinophils Absolute: 468 cells/uL (ref 15–500)
Eosinophils Relative: 4.5 %
HCT: 41.7 % (ref 35.0–45.0)
Hemoglobin: 13.8 g/dL (ref 11.7–15.5)
Lymphs Abs: 2309 cells/uL (ref 850–3900)
MCH: 31.2 pg (ref 27.0–33.0)
MCHC: 33.1 g/dL (ref 32.0–36.0)
MCV: 94.1 fL (ref 80.0–100.0)
MPV: 10.9 fL (ref 7.5–12.5)
Monocytes Relative: 7.2 %
Neutro Abs: 6843 cells/uL (ref 1500–7800)
Neutrophils Relative %: 65.8 %
Platelets: 297 10*3/uL (ref 140–400)
RBC: 4.43 10*6/uL (ref 3.80–5.10)
RDW: 11.9 % (ref 11.0–15.0)
Total Lymphocyte: 22.2 %
WBC: 10.4 10*3/uL (ref 3.8–10.8)

## 2019-07-29 LAB — LIPID PANEL
Cholesterol: 178 mg/dL (ref <200)
HDL: 41 mg/dL — ABNORMAL LOW (ref >=50)
LDL Cholesterol (Calc): 112 mg/dL (calc) — ABNORMAL HIGH
Non-HDL Cholesterol (Calc): 137 mg/dL (calc) — ABNORMAL HIGH (ref <130)
Total CHOL/HDL Ratio: 4.3 (calc) (ref <5.0)
Triglycerides: 130 mg/dL (ref <150)

## 2019-07-29 LAB — HEMOGLOBIN A1C
Hgb A1c MFr Bld: 8.9 % of total Hgb — ABNORMAL HIGH (ref <5.7)
Mean Plasma Glucose: 209 (calc)
eAG (mmol/L): 11.6 (calc)

## 2019-07-31 ENCOUNTER — Encounter: Payer: Medicare Other | Admitting: Internal Medicine

## 2019-07-31 ENCOUNTER — Encounter: Payer: Self-pay | Admitting: Internal Medicine

## 2019-07-31 ENCOUNTER — Telehealth: Payer: Self-pay | Admitting: Internal Medicine

## 2019-07-31 NOTE — Telephone Encounter (Signed)
Patient was scheduled for AWV and CPE today. She called and cancelled due to sore throat and runny nose. Last seen here June 18, 2018. Needs to reschedule as soon as possible as year end is coming.

## 2019-08-08 ENCOUNTER — Other Ambulatory Visit: Payer: Self-pay

## 2019-08-08 ENCOUNTER — Ambulatory Visit (INDEPENDENT_AMBULATORY_CARE_PROVIDER_SITE_OTHER): Payer: Medicare Other | Admitting: Internal Medicine

## 2019-08-08 ENCOUNTER — Encounter: Payer: Self-pay | Admitting: Internal Medicine

## 2019-08-08 VITALS — BP 90/60 | HR 121 | Temp 98.0°F

## 2019-08-08 DIAGNOSIS — E1165 Type 2 diabetes mellitus with hyperglycemia: Secondary | ICD-10-CM | POA: Diagnosis not present

## 2019-08-08 DIAGNOSIS — Z0000 Encounter for general adult medical examination without abnormal findings: Secondary | ICD-10-CM

## 2019-08-08 DIAGNOSIS — E782 Mixed hyperlipidemia: Secondary | ICD-10-CM | POA: Diagnosis not present

## 2019-08-08 DIAGNOSIS — N1831 Chronic kidney disease, stage 3a: Secondary | ICD-10-CM

## 2019-08-08 DIAGNOSIS — E1169 Type 2 diabetes mellitus with other specified complication: Secondary | ICD-10-CM

## 2019-08-08 DIAGNOSIS — N390 Urinary tract infection, site not specified: Secondary | ICD-10-CM

## 2019-08-08 DIAGNOSIS — Z9181 History of falling: Secondary | ICD-10-CM | POA: Diagnosis not present

## 2019-08-08 DIAGNOSIS — R609 Edema, unspecified: Secondary | ICD-10-CM

## 2019-08-08 DIAGNOSIS — N3941 Urge incontinence: Secondary | ICD-10-CM

## 2019-08-08 DIAGNOSIS — Z794 Long term (current) use of insulin: Secondary | ICD-10-CM

## 2019-08-08 DIAGNOSIS — Z872 Personal history of diseases of the skin and subcutaneous tissue: Secondary | ICD-10-CM

## 2019-08-08 DIAGNOSIS — Z8673 Personal history of transient ischemic attack (TIA), and cerebral infarction without residual deficits: Secondary | ICD-10-CM

## 2019-08-08 DIAGNOSIS — R8290 Unspecified abnormal findings in urine: Secondary | ICD-10-CM | POA: Diagnosis not present

## 2019-08-08 DIAGNOSIS — M810 Age-related osteoporosis without current pathological fracture: Secondary | ICD-10-CM

## 2019-08-08 DIAGNOSIS — E785 Hyperlipidemia, unspecified: Secondary | ICD-10-CM

## 2019-08-08 DIAGNOSIS — E119 Type 2 diabetes mellitus without complications: Secondary | ICD-10-CM

## 2019-08-08 DIAGNOSIS — Z7901 Long term (current) use of anticoagulants: Secondary | ICD-10-CM

## 2019-08-08 NOTE — Progress Notes (Signed)
Subjective:    Patient ID: Julie Mora, female    DOB: March 06, 1941, 78 y.o.   MRN: [id number]  HPI  78 year old Female for Medicare Wellness, health maintenance exam and evaluation of medical issues.  She has history of insulin-dependent diabetes, hypertension, vitamin D deficiency, hyperlipidemia, chronic kidney disease, stress and urge urinary incontinence, history of CVA left occipital parietal infarct August 2006.  DVT right leg December 2010.  Was admitted November 2010 with vomiting and acute renal failure.  Was readmitted in 2010 with C. difficile colitis, adrenal insufficiency and anasarca due to multifactorial reasons.  In early 2011 she developed an infected leg ulcer left lower leg and was bedbound for some time.  In February 2010 she was admitted to [facility], ICU with presumed sepsis and tachycardia.  Blood culture grew staph aureus and urine culture grew greater than 100,000 colonies per milliliter E. coli.  In May 2018 she was admitted with septicemia and thought to have aspiration pneumonia.  She had received Pneumovax 23 immunization the day prior to her admission.  She recovered after treatment with IV Zosyn and vancomycin.  Patient has had nausea and vomiting at home and perhaps had aspiration pneumonia.  She also had an Enterococcus UTI.  Pemphigus stable and followed by Dr. Denna Haggard. No longer on Rituximab.  Initial diagnosis of pemphigus May 2010.  Diabetes mellitus followed by Dr.Balan.Has new meter to help her with glucose readings but has not opened box yet.  Followed by Ophthalmologist for keratitis right eye. Has inflamed right eye today.Also has pseudophakia. Was told had no diabetic retinopathy.  Followed by Urology for complex cyst left kidney and cystic lesions pancreas. Needs MRI in near future.  History of recurrent urinary infections.      Sees ophthalmologist fairly regularly.  Ophthalmologist is through [facility].  Says she has  been told she needs corneal transplant of right.   Seen at podiatry for cellulitis and abscess of toe right foot and ingrown toenail of great toe right foot in Fall 2020  In September 2018 she was admitted to the hospital with gallstone pancreatitis and underwent a laparoscopic cholecystectomy.  Longstanding history of obesity.  Social history: She is a widow.  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  She has a Scientist, water quality.  Does not smoke or consume alcohol.  She is a former Warehouse manager.  No children.  Family history: Father died at age 29 reportedly of pancreatic cancer.  Mother died of complications of old age and dementia.  One sister in good health.  No brothers.   Review of Systems no new complaints has been staying and during the pandemic.     Objective:   Physical Exam Blood pressure 90/60 pulse 121, temperature 98 degrees pulse oximetry 97%.  Skin warm and dry.  Nodes none.  TMs are clear.  Neck is supple without JVD thyromegaly or carotid bruits.  Chest clear to auscultation.  Breast without masses.  Cardiac exam regular rate and rhythm normal S1 and S2.  Abdomen obese nondistended without hepatosplenomegaly masses or tenderness.  Trace lower extremity edema.  Neuro intact without focal deficits.       Assessment & Plan:  Poorly controlled type 2 diabetes mellitus this is followed by endocrinologist and she is on Lantus and Humalog in addition to Metformin.  Hemoglobin A1c 8.9%.  Last year it was greater than 14%.  History of mixed hyperlipidemia-stable LDL is 112, has low HDL but normal total  cholesterol and normal triglycerides.  Obesity-TSH is within normal limits  Stress and urge urinary incontinence treated with Myrbetriq by urologist  History of recurrent urinary infections  History of pemphigus currently in remission  History of hypertension but blood pressure is actually low today.  Needs to stay well-hydrated.  She takes Demadex for lower extremity  edema.  Advised her to continue monitor blood pressure at home.  History of osteopenia treated with Fosamax.  She has been on this more than 5 years but we have decided just to keep her on it.  T score is -3.4 on bone density study done October 2020.  Previous T score -2.7.  We will welcome input from Dr. Chalmers Cater.  Plan: Difficulty as always obtaining urine specimen.  She will need to do this from home and bring specimen into the office.  Continue to encourage watching her diet.  Not really able to exercise very much.  Has osteoarthritis in her knees.  Has walker to help steady her gait.  Seems to be doing okay living alone.  Return in 1 year or as needed.  Continue follow-up on diabetes with Dr. Chalmers Cater.  Subjective:   Patient presents for Medicare Annual/Subsequent preventive examination.  Review Past Medical/Family/Social: See above   Risk Factors  Current exercise habits: Sedentary Dietary issues discussed: Advise low-fat low carbohydrate  Cardiac risk factors: Diabetes mellitus  Depression Screen  (Note: if answer to either of the following is "Yes", a more complete depression screening is indicated)   Over the past two weeks, have you felt down, depressed or hopeless? No  Over the past two weeks, have you felt little interest or pleasure in doing things? No Have you lost interest or pleasure in daily life? No Do you often feel hopeless? No Do you cry easily over simple problems? No   Activities of Daily Living  In your present state of health, do you have any difficulty performing the following activities?:   Driving? No  Managing money? No  Feeding yourself? No  Getting from bed to chair? No  Climbing a flight of stairs?  Yes Preparing food and eating?:  Yes cannot do prolonged standing Bathing or showering?  Yes due to ambulation issues and osteoarthritis Getting dressed: No  Getting to the toilet? No  Using the toilet:No  Moving around from place to place: Yes due to  osteoarthritis In the past year have you fallen or had a near fall?:  Yes Are you sexually active? No  Do you have more than one partner? No   Hearing Difficulties: No  Do you often ask people to speak up or repeat themselves? No  Do you experience ringing or noises in your ears? No  Do you have difficulty understanding soft or whispered voices? No  Do you feel that you have a problem with memory?  Yes sometimes Do you often misplace items?  Yes   Home Safety:  Do you have a smoke alarm at your residence? Yes Do you have grab bars in the bathroom? Do you have throw rugs in your house?   Cognitive Testing  Alert? Yes Normal Appearance?Yes  Oriented to person? Yes Place? Yes  Time? Yes  Recall of three objects? Yes  Can perform simple calculations? Yes  Displays appropriate judgment?Yes  Can read the correct time from a watch face?Yes   List the Names of Other Physician/Practitioners you currently use:  See referral list for the physicians patient is currently seeing.  Dr. Chalmers Cater endocrinologist  Ophthalmologist at [facility]   Review of Systems: See above  Objective:     General appearance: Appears stated age and  obese  Head: Normocephalic, without obvious abnormality, atraumatic  Eyes: conj clear, EOMi PEERLA  Ears: normal TM's and external ear canals both ears  Nose: Nares normal. Septum midline. Mucosa normal. No drainage or sinus tenderness.  Throat: lips, mucosa, and tongue normal; teeth and gums normal  Neck: no adenopathy, no carotid bruit, no JVD, supple, symmetrical, trachea midline and thyroid not enlarged, symmetric, no tenderness/mass/nodules  No CVA tenderness.  Lungs: clear to auscultation bilaterally  Breasts normal without masses Heart: regular rate and rhythm, S1, S2 normal, no murmur, click, rub or gallop  Abdomen: soft, non-tender; bowel sounds normal; no masses, no organomegaly  Musculoskeletal: ROM normal in all joints,  no crepitus, no deformity, Normal muscle strengthen. Back  is symmetric, no curvature. Skin: Skin color, texture, turgor normal. No rashes or lesions  Lymph nodes: Cervical, supraclavicular, and axillary nodes normal.  Neurologic: CN 2 -12 Normal, Normal symmetric reflexes. Normal coordination and gait  Psych: Alert & Oriented x 3, Mood appear stable.    Assessment:    Annual wellness medicare exam   Plan:    During the course of the visit the patient was educated and counseled about appropriate screening and preventive services including:   Had flu vaccine high dose in October at pharmacy     Patient Instructions (the written plan) was given to the patient.  Medicare Attestation  I have personally reviewed:  The patient's medical and social history  Their use of alcohol, tobacco or illicit drugs  Their current medications and supplements  The patient's functional ability including ADLs,fall risks, home safety risks, cognitive, and hearing and visual impairment  Diet and physical activities  Evidence for depression or mood disorders  The patient's weight, height, BMI, and visual acuity have been recorded in the chart. I have made referrals, counseling, and provided education to the patient based on review of the above and I have provided the patient with a written personalized care plan for preventive services.

## 2019-08-08 NOTE — Patient Instructions (Signed)
Please watch diet and use home glucose monitor. Bring urine specimen back. RTC one year or as needed.

## 2019-08-21 LAB — POCT URINALYSIS DIPSTICK
Bilirubin, UA: NEGATIVE
Glucose, UA: NEGATIVE
Ketones, UA: NEGATIVE
Nitrite, UA: POSITIVE
Protein, UA: POSITIVE — AB
Spec Grav, UA: 1.015 (ref 1.010–1.025)
Urobilinogen, UA: 0.2 E.U./dL
pH, UA: 6 (ref 5.0–8.0)

## 2019-08-23 LAB — URINE CULTURE
MICRO NUMBER:: 1160107
SPECIMEN QUALITY:: ADEQUATE

## 2019-08-25 ENCOUNTER — Telehealth: Payer: Self-pay

## 2019-08-25 MED ORDER — LEVOFLOXACIN 250 MG PO TABS: 250.0000 mg | ORAL_TABLET | Freq: Every day | ORAL | 0 refills | Status: DC

## 2019-08-25 NOTE — Telephone Encounter (Signed)
rx was sent

## 2019-08-27 NOTE — Telephone Encounter (Signed)
Scheduled

## 2019-08-27 NOTE — Telephone Encounter (Signed)
Set up visit tomorrow.

## 2019-08-27 NOTE — Telephone Encounter (Signed)
Julie Mora  [id number]  [name] called to say she had pick up medication that was sent in for her, however she was reading the fine print of side effects and stated that she had these side effects with other medications in the past and was wandering if she should take the medication since she is not having any symptoms. She stated she would maybe like a virtual visit to discuss.

## 2019-08-28 ENCOUNTER — Ambulatory Visit (INDEPENDENT_AMBULATORY_CARE_PROVIDER_SITE_OTHER): Payer: Medicare Other | Admitting: Internal Medicine

## 2019-08-28 DIAGNOSIS — B9689 Other specified bacterial agents as the cause of diseases classified elsewhere: Secondary | ICD-10-CM

## 2019-08-28 DIAGNOSIS — N390 Urinary tract infection, site not specified: Secondary | ICD-10-CM

## 2019-08-28 NOTE — Progress Notes (Signed)
   Subjective:    Patient ID: Julie Mora, female    DOB: Sep 03, 1941, 78 y.o.   MRN: [id number]  HPI Patient was seen in November for health maintenance exam and was found to have a urinary tract infection.  She has a history of frequent urinary infections.  Has difficulty getting clean-catch urine specimen here so was given materials to collect clean-catch urine at home.  She had routine physical exam on November 20 but did not return urine specimen until December 3.  It was large leukocyte esterase on urine dipstick.  Culture was sent and grew Klebsiella pneumonia.  Elected to treat patient with Levaquin 1 time daily and sensitivity was less than 0.129.  However patient had questions about that treatment.  She requested to speak with me.  She is now seen by interactive audio and video telecommunications due to the coronavirus pandemic.  She is identified using 2 identifiers as Julie Mora, a longstanding patient in this practice.  Patient recalls having taking Cipro in the past but is not familiar with Levaquin.  Explained to her that Levaquin is related to ciprofloxacin.  They were in the same class of drugs known as quinolones.  It is nice that Levaquin is only once daily.    Review of Systems has no dysuria, no back pain related to UTI     Objective:   Physical Exam  Reports being afebrile      Assessment & Plan:  Klebsiella pneumoniae UTI  Plan: Patient agrees to take Levaquin 1 tab daily with a meal for 10 days.  Will need follow-up clean-catch urinalysis after course of treatment.  I am not sure it is possible to get a clean-catch urine as she has difficulty giving urine specimen and managing to get clean-catch.

## 2019-09-14 ENCOUNTER — Encounter: Payer: Self-pay | Admitting: Internal Medicine

## 2019-09-14 NOTE — Patient Instructions (Addendum)
Take Levaquin 250 mg daily for 10 days with a meal.  Follow-up with repeat urine specimen in a couple of weeks after treatment.

## 2019-09-16 ENCOUNTER — Telehealth: Payer: Self-pay | Admitting: Internal Medicine

## 2019-09-16 ENCOUNTER — Encounter: Payer: Self-pay | Admitting: Internal Medicine

## 2019-09-16 MED ORDER — POTASSIUM CHLORIDE ER 10 MEQ PO TBCR: EXTENDED_RELEASE_TABLET | ORAL | 3 refills | Status: DC

## 2019-09-16 NOTE — Telephone Encounter (Signed)
Refill potassium chloride 10 meq daily x one year

## 2019-10-20 ENCOUNTER — Ambulatory Visit: Payer: Medicare Other | Admitting: Podiatry

## 2019-11-26 DIAGNOSIS — Z961 Presence of intraocular lens: Secondary | ICD-10-CM | POA: Diagnosis not present

## 2019-11-26 DIAGNOSIS — H18002 Unspecified corneal deposit, left eye: Secondary | ICD-10-CM | POA: Diagnosis not present

## 2019-11-26 DIAGNOSIS — H04123 Dry eye syndrome of bilateral lacrimal glands: Secondary | ICD-10-CM | POA: Diagnosis not present

## 2020-01-15 ENCOUNTER — Telehealth: Payer: Self-pay | Admitting: Dermatology

## 2020-01-15 NOTE — Telephone Encounter (Signed)
Patient called saying that she had spoken to  Dr. Denna Haggard (M[facility]) last month asking about getting the COVID-19 vaccine.  Julie Mora said that due to her condition ST did not recommend she get the vaccine.  Julie Mora said that Julie Mora  told her to call back in another month.  Julie Mora is calling to get ST's  advice on whether or not to get the vaccine.  If okay, which vaccine should she get?  Chart # [id number]

## 2020-01-16 NOTE — Telephone Encounter (Signed)
Patient aware of Dr Delma Officer message see previous message. Patient stated she has a appointment tomorrow to get it

## 2020-01-16 NOTE — Telephone Encounter (Signed)
Inform her all recent studies indicate potential benefit of Owen vaccine outweighs any potential risk so Dr. Darene Lamer now comfortable with her receiving one of these. There would be a small chance her body's immunity would not produce as high a level of immunity as other folks.

## 2020-01-20 ENCOUNTER — Telehealth: Payer: Self-pay | Admitting: Dermatology

## 2020-01-20 NOTE — Telephone Encounter (Signed)
Patient states that someone from our office called her yesterday and left a message for her to call us back.

## 2020-01-20 NOTE — Telephone Encounter (Signed)
Patient didn't need anything

## 2020-05-21 ENCOUNTER — Emergency Department (HOSPITAL_COMMUNITY): Payer: Medicare PPO

## 2020-05-21 ENCOUNTER — Other Ambulatory Visit: Payer: Self-pay

## 2020-05-21 ENCOUNTER — Inpatient Hospital Stay (HOSPITAL_COMMUNITY)
Admission: EM | Admit: 2020-05-21 | Discharge: 2020-05-26 | DRG: 872 | Disposition: A | Discharge status: Skilled Nursing Facility | Payer: Medicare PPO | Attending: Student | Admitting: Student

## 2020-05-21 DIAGNOSIS — Z66 Do not resuscitate: Secondary | ICD-10-CM | POA: Diagnosis present

## 2020-05-21 DIAGNOSIS — I10 Essential (primary) hypertension: Secondary | ICD-10-CM | POA: Diagnosis not present

## 2020-05-21 DIAGNOSIS — E1122 Type 2 diabetes mellitus with diabetic chronic kidney disease: Secondary | ICD-10-CM | POA: Diagnosis present

## 2020-05-21 DIAGNOSIS — L0390 Cellulitis, unspecified: Secondary | ICD-10-CM | POA: Diagnosis not present

## 2020-05-21 DIAGNOSIS — I499 Cardiac arrhythmia, unspecified: Secondary | ICD-10-CM | POA: Diagnosis not present

## 2020-05-21 DIAGNOSIS — J189 Pneumonia, unspecified organism: Secondary | ICD-10-CM | POA: Diagnosis present

## 2020-05-21 DIAGNOSIS — E785 Hyperlipidemia, unspecified: Secondary | ICD-10-CM | POA: Diagnosis present

## 2020-05-21 DIAGNOSIS — Z043 Encounter for examination and observation following other accident: Secondary | ICD-10-CM | POA: Diagnosis not present

## 2020-05-21 DIAGNOSIS — R000 Tachycardia, unspecified: Secondary | ICD-10-CM | POA: Diagnosis not present

## 2020-05-21 DIAGNOSIS — Z743 Need for continuous supervision: Secondary | ICD-10-CM | POA: Diagnosis not present

## 2020-05-21 DIAGNOSIS — R8281 Pyuria: Secondary | ICD-10-CM | POA: Diagnosis present

## 2020-05-21 DIAGNOSIS — M25552 Pain in left hip: Secondary | ICD-10-CM | POA: Diagnosis present

## 2020-05-21 DIAGNOSIS — R52 Pain, unspecified: Secondary | ICD-10-CM | POA: Diagnosis not present

## 2020-05-21 DIAGNOSIS — N390 Urinary tract infection, site not specified: Secondary | ICD-10-CM | POA: Diagnosis present

## 2020-05-21 DIAGNOSIS — Z888 Allergy status to other drugs, medicaments and biological substances status: Secondary | ICD-10-CM

## 2020-05-21 DIAGNOSIS — Z79899 Other long term (current) drug therapy: Secondary | ICD-10-CM

## 2020-05-21 DIAGNOSIS — Z8673 Personal history of transient ischemic attack (TIA), and cerebral infarction without residual deficits: Secondary | ICD-10-CM

## 2020-05-21 DIAGNOSIS — E119 Type 2 diabetes mellitus without complications: Secondary | ICD-10-CM | POA: Diagnosis not present

## 2020-05-21 DIAGNOSIS — M40204 Unspecified kyphosis, thoracic region: Secondary | ICD-10-CM | POA: Diagnosis not present

## 2020-05-21 DIAGNOSIS — I872 Venous insufficiency (chronic) (peripheral): Secondary | ICD-10-CM | POA: Diagnosis present

## 2020-05-21 DIAGNOSIS — I2510 Atherosclerotic heart disease of native coronary artery without angina pectoris: Secondary | ICD-10-CM | POA: Diagnosis present

## 2020-05-21 DIAGNOSIS — Z7401 Bed confinement status: Secondary | ICD-10-CM | POA: Diagnosis not present

## 2020-05-21 DIAGNOSIS — Z7982 Long term (current) use of aspirin: Secondary | ICD-10-CM

## 2020-05-21 DIAGNOSIS — Z20822 Contact with and (suspected) exposure to covid-19: Secondary | ICD-10-CM | POA: Diagnosis present

## 2020-05-21 DIAGNOSIS — R509 Fever, unspecified: Secondary | ICD-10-CM

## 2020-05-21 DIAGNOSIS — Z794 Long term (current) use of insulin: Secondary | ICD-10-CM

## 2020-05-21 DIAGNOSIS — L97929 Non-pressure chronic ulcer of unspecified part of left lower leg with unspecified severity: Secondary | ICD-10-CM | POA: Diagnosis present

## 2020-05-21 DIAGNOSIS — L97919 Non-pressure chronic ulcer of unspecified part of right lower leg with unspecified severity: Secondary | ICD-10-CM | POA: Diagnosis present

## 2020-05-21 DIAGNOSIS — Z86718 Personal history of other venous thrombosis and embolism: Secondary | ICD-10-CM

## 2020-05-21 DIAGNOSIS — R8271 Bacteriuria: Secondary | ICD-10-CM | POA: Diagnosis present

## 2020-05-21 DIAGNOSIS — L03115 Cellulitis of right lower limb: Secondary | ICD-10-CM | POA: Diagnosis present

## 2020-05-21 DIAGNOSIS — R918 Other nonspecific abnormal finding of lung field: Secondary | ICD-10-CM | POA: Diagnosis present

## 2020-05-21 DIAGNOSIS — L03116 Cellulitis of left lower limb: Secondary | ICD-10-CM | POA: Diagnosis present

## 2020-05-21 DIAGNOSIS — M2550 Pain in unspecified joint: Secondary | ICD-10-CM | POA: Diagnosis not present

## 2020-05-21 DIAGNOSIS — I129 Hypertensive chronic kidney disease with stage 1 through stage 4 chronic kidney disease, or unspecified chronic kidney disease: Secondary | ICD-10-CM | POA: Diagnosis present

## 2020-05-21 DIAGNOSIS — W19XXXA Unspecified fall, initial encounter: Secondary | ICD-10-CM | POA: Diagnosis present

## 2020-05-21 DIAGNOSIS — Z6841 Body Mass Index (BMI) 40.0 and over, adult: Secondary | ICD-10-CM

## 2020-05-21 DIAGNOSIS — N1831 Chronic kidney disease, stage 3a: Secondary | ICD-10-CM | POA: Diagnosis present

## 2020-05-21 DIAGNOSIS — E559 Vitamin D deficiency, unspecified: Secondary | ICD-10-CM | POA: Diagnosis present

## 2020-05-21 DIAGNOSIS — I700 Atherosclerosis of aorta: Secondary | ICD-10-CM | POA: Diagnosis not present

## 2020-05-21 DIAGNOSIS — B952 Enterococcus as the cause of diseases classified elsewhere: Secondary | ICD-10-CM | POA: Diagnosis present

## 2020-05-21 DIAGNOSIS — Y92019 Unspecified place in single-family (private) house as the place of occurrence of the external cause: Secondary | ICD-10-CM

## 2020-05-21 DIAGNOSIS — M7989 Other specified soft tissue disorders: Secondary | ICD-10-CM | POA: Diagnosis not present

## 2020-05-21 DIAGNOSIS — L03119 Cellulitis of unspecified part of limb: Secondary | ICD-10-CM | POA: Diagnosis not present

## 2020-05-21 DIAGNOSIS — A419 Sepsis, unspecified organism: Secondary | ICD-10-CM | POA: Diagnosis present

## 2020-05-21 DIAGNOSIS — M6259 Muscle wasting and atrophy, not elsewhere classified, multiple sites: Secondary | ICD-10-CM | POA: Diagnosis not present

## 2020-05-21 DIAGNOSIS — E1165 Type 2 diabetes mellitus with hyperglycemia: Secondary | ICD-10-CM | POA: Diagnosis present

## 2020-05-21 DIAGNOSIS — Y92009 Unspecified place in unspecified non-institutional (private) residence as the place of occurrence of the external cause: Secondary | ICD-10-CM | POA: Diagnosis not present

## 2020-05-21 DIAGNOSIS — R0902 Hypoxemia: Secondary | ICD-10-CM | POA: Diagnosis not present

## 2020-05-21 DIAGNOSIS — Z7983 Long term (current) use of bisphosphonates: Secondary | ICD-10-CM

## 2020-05-21 DIAGNOSIS — Z800 Family history of malignant neoplasm of digestive organs: Secondary | ICD-10-CM

## 2020-05-21 DIAGNOSIS — Z20828 Contact with and (suspected) exposure to other viral communicable diseases: Secondary | ICD-10-CM | POA: Diagnosis not present

## 2020-05-21 DIAGNOSIS — R531 Weakness: Secondary | ICD-10-CM | POA: Diagnosis present

## 2020-05-21 DIAGNOSIS — R5381 Other malaise: Secondary | ICD-10-CM | POA: Diagnosis not present

## 2020-05-21 DIAGNOSIS — L538 Other specified erythematous conditions: Secondary | ICD-10-CM | POA: Diagnosis not present

## 2020-05-21 DIAGNOSIS — I959 Hypotension, unspecified: Secondary | ICD-10-CM | POA: Diagnosis not present

## 2020-05-21 LAB — FIBRINOGEN: Fibrinogen: 482 mg/dL — ABNORMAL HIGH (ref 210–475)

## 2020-05-21 LAB — COMPREHENSIVE METABOLIC PANEL
ALT: 15 U/L (ref 0–44)
AST: 27 U/L (ref 15–41)
Albumin: 2.9 g/dL — ABNORMAL LOW (ref 3.5–5.0)
Alkaline Phosphatase: 72 U/L (ref 38–126)
Anion gap: 9 (ref 5–15)
BUN: 27 mg/dL — ABNORMAL HIGH (ref 8–23)
CO2: 24 mmol/L (ref 22–32)
Calcium: 8.8 mg/dL — ABNORMAL LOW (ref 8.9–10.3)
Chloride: 105 mmol/L (ref 98–111)
Creatinine, Ser: 1.25 mg/dL — ABNORMAL HIGH (ref 0.44–1.00)
GFR calc Af Amer: 48 mL/min — ABNORMAL LOW (ref >60)
GFR calc non Af Amer: 41 mL/min — ABNORMAL LOW (ref >60)
Glucose, Bld: 185 mg/dL — ABNORMAL HIGH (ref 70–99)
Potassium: 4.7 mmol/L (ref 3.5–5.1)
Sodium: 138 mmol/L (ref 135–145)
Total Bilirubin: 0.3 mg/dL (ref 0.3–1.2)
Total Protein: 7 g/dL (ref 6.5–8.1)

## 2020-05-21 LAB — BLOOD GAS, VENOUS
Acid-base deficit: 0.7 mmol/L (ref 0.0–2.0)
Bicarbonate: 25.3 mmol/L (ref 20.0–28.0)
O2 Saturation: 61.5 %
Patient temperature: 98.6
pCO2, Ven: 49.9 mmHg (ref 44.0–60.0)
pH, Ven: 7.326 (ref 7.250–7.430)
pO2, Ven: 37.3 mmHg (ref 32.0–45.0)

## 2020-05-21 LAB — CBC WITH DIFFERENTIAL/PLATELET
Abs Immature Granulocytes: 0.04 10*3/uL (ref 0.00–0.07)
Basophils Absolute: 0 10*3/uL (ref 0.0–0.1)
Basophils Relative: 0 %
Eosinophils Absolute: 0.2 10*3/uL (ref 0.0–0.5)
Eosinophils Relative: 3 %
HCT: 42.5 % (ref 36.0–46.0)
Hemoglobin: 13.6 g/dL (ref 12.0–15.0)
Immature Granulocytes: 1 %
Lymphocytes Relative: 13 %
Lymphs Abs: 1.1 10*3/uL (ref 0.7–4.0)
MCH: 31.9 pg (ref 26.0–34.0)
MCHC: 32 g/dL (ref 30.0–36.0)
MCV: 99.5 fL (ref 80.0–100.0)
Monocytes Absolute: 0.7 10*3/uL (ref 0.1–1.0)
Monocytes Relative: 8 %
Neutro Abs: 6.1 10*3/uL (ref 1.7–7.7)
Neutrophils Relative %: 75 %
Platelets: 218 10*3/uL (ref 150–400)
RBC: 4.27 MIL/uL (ref 3.87–5.11)
RDW: 13.2 % (ref 11.5–15.5)
WBC: 8.2 10*3/uL (ref 4.0–10.5)
nRBC: 0 % (ref 0.0–0.2)

## 2020-05-21 LAB — D-DIMER, QUANTITATIVE: D-Dimer, Quant: 1.75 ug/mL-FEU — ABNORMAL HIGH (ref 0.00–0.50)

## 2020-05-21 LAB — LACTATE DEHYDROGENASE: LDH: 151 U/L (ref 98–192)

## 2020-05-21 LAB — FERRITIN: Ferritin: 203 ng/mL (ref 11–307)

## 2020-05-21 LAB — LACTIC ACID, PLASMA: Lactic Acid, Venous: 1.3 mmol/L (ref 0.5–1.9)

## 2020-05-21 LAB — C-REACTIVE PROTEIN: CRP: 3.7 mg/dL — ABNORMAL HIGH (ref <1.0)

## 2020-05-21 LAB — PROCALCITONIN: Procalcitonin: <0.1 ng/mL

## 2020-05-21 MED ORDER — ACETAMINOPHEN 325 MG PO TABS
650.0000 mg | ORAL_TABLET | Freq: Once | ORAL | Status: AC
  Administered 2020-05-21: 650 mg via ORAL
  Filled 2020-05-21: qty 2

## 2020-05-21 MED ORDER — SODIUM CHLORIDE 0.9 % IV BOLUS
1000.0000 mL | Freq: Once | INTRAVENOUS | Status: AC
  Administered 2020-05-21: 1000 mL via INTRAVENOUS

## 2020-05-21 NOTE — ED Triage Notes (Signed)
Pt comes to ed via ems, c/o bilateral leg pain and left hip and right knee s/p fall Wednesday pt verbalizes weakness and fever with increased pain tonight. Pt is from home,  Pt is alert  X 4.  Pt v/s on arrival 101.3 tympanic, bp 104/72, hr 125, rr20, cbg 201, sp02 97 room air.

## 2020-05-21 NOTE — ED Provider Notes (Addendum)
[facility] DEPT Provider Note   CSN: [id number] Arrival date & time: 05/21/20  2114     History Chief Complaint  Patient presents with  . Leg Pain    Julie Mora is a 79 y.o. female.  HPI She presents for evaluation of left hip pain since a fall 2 days ago.  She has been ambulating at home but having pain with walking.  She lives alone.  Her sister came to visit yesterday and was concerned about the way she left so called EMS.   Level V Caveat-altered mental status    Past Medical History:  Diagnosis Date  . Anemia   . Essential hypertension, benign   . Incontinence   . Other and unspecified hyperlipidemia   . Pemphigus foliaceous   . Renal insufficiency   . Type II or unspecified type diabetes mellitus without mention of complication, not stated as uncontrolled   . Vitamin D deficiency     Patient Active Problem List   Diagnosis Date Noted  . Paronychia of right middle finger 10/23/2018  . Primary osteoarthritis of both first carpometacarpal joints 10/23/2018  . Spasms of the hands or feet 10/23/2018  . Pancreatic cyst 06/07/2017  . UTI (urinary tract infection) 06/07/2017  . Morbid obesity ([place]) 03/02/2017  . Poorly controlled diabetes mellitus ([place]) 03/02/2017  . Stasis dermatitis of both legs 03/02/2017  . Lymphedema of both lower extremities 06/21/2015  . Venous insufficiency (chronic) (peripheral) 06/21/2015  . Osteoporosis 12/15/2014  . PCO (posterior capsular opacification) 10/15/2013  . Keratitis 08/20/2013  . Corneal scarring 03/03/2013  . Metabolic syndrome 56/43/3295  . History of stroke 05/03/2012  . Ectropion 10/10/2011  . 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 Pseudophakia 10/10/2011  . History of deep venous thrombosis 04/19/2011  . Vitamin D deficiency 04/19/2011  . History of iron deficiency 04/19/2011  . Dependent edema 04/19/2011  . Mixed stress and urge urinary incontinence  04/19/2011  . CEREBROVASCULAR ACCIDENT, ACUTE 06/25/2009  . PEMPHIGUS FOLIACEOUS 06/25/2009  . Diabetes mellitus ([place]) 05/28/2009  . HYPERCHOLESTEROLEMIA 05/28/2009  . Essential hypertension 05/28/2009    Past Surgical History:  Procedure Laterality Date  . CHOLECYSTECTOMY N/A 06/08/2017   Procedure: LAPAROSCOPIC CHOLECYSTECTOMY;  Surgeon: Kinsinger, Arta Bruce, MD;  Location: [facility] ORS;  Service: General;  Laterality: N/A;     OB History   No obstetric history on file.     Family History  Problem Relation Age of Onset  . Pancreatic cancer Father 42  . Dementia Mother 20    Social History   Tobacco Use  . Smoking status: Never Smoker  . Smokeless tobacco: Never Used  Vaping Use  . Vaping Use: Never used  Substance Use Topics  . Alcohol use: No  . Drug use: No    Home Medications Prior to Admission medications   Medication Sig Start Date End Date Taking? Authorizing Provider  ACCU-CHEK AVIVA PLUS test strip USE TO CHECK BLOOD SUGAR BEFORE BREAKFAST AND SUPPER 11/09/14   Elby Showers, MD  acetaminophen (TYLENOL) 500 MG tablet Take 500 mg by mouth every 6 (six) hours as needed for pain.    [provider]  alendronate (FOSAMAX) 70 MG tablet TAKE 1 TABLET(70 MG) BY MOUTH EVERY 7 DAYS WITH A FULL GLASS OF WATER AND ON AN EMPTY STOMACH 04/29/19   Elby Showers, MD  aspirin EC 81 MG tablet Take by mouth.    [provider]  BD PEN NEEDLE NANO U/F 32G X  4 MM MISC USE AS DIRECTED 01/11/15   Elby Showers, MD  CALCIUM ASCORBATE PO Take 1 tablet by mouth daily.    [provider]  Cholecalciferol (VITAMIN D) 2000 UNITS tablet Take 2,000 Units by mouth daily.      [provider]  clotrimazole-betamethasone (LOTRISONE) lotion Apply to both feet twice daily 07/29/18   Marzetta Board, DPM  ferrous gluconate (FERGON) 324 MG tablet TAKE 1 TABLET(324 MG) BY MOUTH DAILY WITH BREAKFAST 09/14/18   Elby Showers, MD  gabapentin (NEURONTIN) 100 MG  capsule Take 100 mg by mouth daily. 07/09/19   [provider]  insulin aspart (NOVOLOG) 100 UNIT/ML injection Inject 5-12 Units into the skin 2 (two) times daily as needed for high blood sugar. Patient taking differently: Inject 4-14 Units into the skin 3 (three) times daily with meals.  12/15/14   Elby Showers, MD  Insulin Glargine (LANTUS SOLOSTAR) 100 UNIT/ML Solostar Pen Inject 35 units in skin at bedtime Patient taking differently: Inject 20-45 Units into the skin 2 (two) times daily. 20 units in the AM and  45 units in the PM 01/01/14   Elby Showers, MD  insulin lispro (HUMALOG) 100 UNIT/ML KiwkPen  05/29/09   [provider]  Lactobacillus Rhamnosus, GG, (CULTURELLE PO) Take 1 capsule by mouth daily.    [provider]  levofloxacin (LEVAQUIN) 250 MG tablet Take 1 tablet (250 mg total) by mouth daily. 08/25/19   Elby Showers, MD  metFORMIN (GLUCOPHAGE) 500 MG tablet  02/10/09   [provider]  multivitamin [facility]) per tablet Take 1 tablet by mouth daily.      [provider]  MYRBETRIQ 50 MG TB24 tablet TK 1 T PO QD 11/14/18   [provider]  nystatin cream (MYCOSTATIN) Apply 1 application topically 2 (two) times daily. 03/02/17   Elby Showers, MD  Polyethyl Glycol-Propyl Glycol (SYSTANE) 0.4-0.3 % SOLN Apply 1 drop to eye 3 (three) times daily. Both eyes, as needed    [provider]  potassium chloride (K-DUR,KLOR-CON) 10 MEQ tablet  06/25/10   [provider]  potassium chloride (KLOR-CON) 10 MEQ tablet TAKE 1 TABLET(10 MEQ) BY MOUTH DAILY 09/16/19   Elby Showers, MD  prednisoLONE acetate (PRED FORTE) 1 % ophthalmic suspension Place 1 drop into both eyes 2 (two) times daily.  05/08/12   [provider]  silver sulfADIAZINE (SILVADENE) 1 % cream  09/06/10   [provider]  simvastatin (ZOCOR) 20 MG tablet TAKE 1 TABLET BY MOUTH EVERY NIGHT AT BEDTIME 04/18/19   Elby Showers, MD  tacrolimus  (PROTOPIC) 0.1 % ointment  08/23/10   [provider]  torsemide (DEMADEX) 20 MG tablet TAKE 1 TABLET(20 MG) BY MOUTH DAILY 07/17/19   Elby Showers, MD  triamcinolone cream (KENALOG) 0.1 % APP AA QD UTD 03/28/19   [provider]    Allergies    Azathioprine, Cellcept [mycophenolate mofetil], and Ramipril  Review of Systems   Review of Systems  Physical Exam Updated Vital Signs BP 129/87 (BP Location: Right Arm)   Pulse (!) 103   Temp 98.3 F (36.8 C) (Oral)   Resp 17   LMP  (LMP Unknown)   SpO2 100%   Physical Exam Vitals and nursing note reviewed.  Constitutional:      General: She is not in acute distress.    Appearance: Normal appearance. She is well-developed. She is obese. She is not ill-appearing or  diaphoretic.  HENT:     Head: Normocephalic and atraumatic.     Right Ear: External ear normal.     Left Ear: External ear normal.  Eyes:     Conjunctiva/sclera: Conjunctivae normal.     Pupils: Pupils are equal, round, and reactive to light.  Neck:     Trachea: Phonation normal.  Cardiovascular:     Rate and Rhythm: Normal rate and regular rhythm.     Heart sounds: Normal heart sounds.  Pulmonary:     Effort: Pulmonary effort is normal.     Breath sounds: Normal breath sounds.  Abdominal:     General: There is no distension.     Palpations: Abdomen is soft.     Tenderness: There is no abdominal tenderness.  Musculoskeletal:     Cervical back: Normal range of motion and neck supple.     Right lower leg: Edema present.     Left lower leg: Edema present.     Comments: Tender left hip to palpation and guards against passive range of motion secondary to pain at the left hip.  Skin:    General: Skin is warm and dry.  Neurological:     Mental Status: She is alert and oriented to person, place, and time.     Cranial Nerves: No cranial nerve deficit.     Sensory: No sensory deficit.     Motor: No abnormal muscle tone.     Coordination: Coordination  normal.  Psychiatric:        Mood and Affect: Mood normal.        Behavior: Behavior normal.        Thought Content: Thought content normal.        Judgment: Judgment normal.     ED Results / Procedures / Treatments   Labs (all labs ordered are listed, but only abnormal results are displayed) Labs Reviewed  SARS CORONAVIRUS 2 BY RT PCR ([facility])  CULTURE, BLOOD (ROUTINE X 2)  CULTURE, BLOOD (ROUTINE X 2)  COMPREHENSIVE METABOLIC PANEL  CBC WITH DIFFERENTIAL/PLATELET  BLOOD GAS, VENOUS  URINALYSIS, ROUTINE W REFLEX MICROSCOPIC  LACTIC ACID, PLASMA  LACTIC ACID, PLASMA  C-REACTIVE PROTEIN  D-DIMER, QUANTITATIVE (NOT AT [facility])  FERRITIN  FIBRINOGEN  LACTATE DEHYDROGENASE  PROCALCITONIN    EKG None  Radiology DG Chest 2 View  Result Date: 05/21/2020 CLINICAL DATA:  Fever weakness EXAM: CHEST - 2 VIEW COMPARISON:  Radiograph 06/10/2017 FINDINGS: Patchy heterogeneous opacities are seen in the periphery of the left lung and more mildly in right lung base. No pneumothorax or effusion. Cardiomediastinal contours are similar to priors accounting for differences in technique. No acute osseous or soft tissue abnormality. Degenerative changes are present in the imaged spine and shoulders. Few remote right rib deformities are again noted. IMPRESSION: Patchy heterogeneous opacities in the periphery of the left lung and more mildly in the right lung base, suspicious for pneumonia including atypical viral etiologies such as COVID-19. Electronically Signed   By: Lovena Le M.D.   On: 05/21/2020 22:30    Procedures Procedures (including critical care time)  Medications Ordered in ED Medications  sodium chloride 0.9 % bolus 1,000 mL (has no administration in time range)  acetaminophen (TYLENOL) tablet 650 mg (has no administration in time range)    ED Course  I have reviewed the triage vital signs and the nursing notes.  Pertinent labs &  imaging results that were available during my care of  the patient were reviewed by me and considered in my medical decision making (see chart for details).  Clinical Course as of May 21 2245  Fri May 21, 2020  2241 Abnormal, infiltrates are present  DG Chest 2 View [EW]    Clinical Course User Index [EW] Daleen Bo, MD   MDM Rules/Calculators/A&P                           Patient Vitals for the past 24 hrs:  BP Temp Temp src Pulse Resp SpO2  05/21/20 2133 129/87 98.3 F (36.8 C) Oral (!) 103 17 100 %     Medical Decision Making:  This patient is presenting for evaluation of nonspecific malaise, causing fall without serious injury.  Patient has fever and no specific symptoms for infection, which does require a range of treatment options, and is a complaint that involves a high risk of morbidity and mortality. The differential diagnoses include acute viral infection, acute bacterial infection, contusions from fall. I decided to review old records, and in summary elderly obese female who lives alone, presenting after a fall 2 days ago.  She is able to walk but has mild hip pain.  Doubt fracture of the left hip..  I did not require additional historical information from anyone.  Clinical Laboratory Tests Ordered, included CBC, Metabolic panel, Urinalysis and Lactate, inflammatory markers for possible Covid infection. Radiologic Tests Ordered, included chest x-ray.  I independently Visualized: Radiographic images, which show nonspecific infiltrates    Critical Interventions-clinical evaluation, laboratory testing, radiographic imaging, observation reassessment  After These Interventions, the Patient was reevaluated and was found with abnormal chest x-ray concerning for infection either viral or bacterial  CRITICAL CARE-no Performed by: Daleen Bo  Nursing Notes Reviewed/ Care Coordinated Applicable Imaging Reviewed Interpretation of Laboratory Data incorporated into ED  treatment   Plan disposition by oncoming provider team following completion of ED evaluation    Final Clinical Impression(s) / ED Diagnoses Final diagnoses:  Febrile illness    Rx / DC Orders ED Discharge Orders    None       Daleen Bo, MD 05/21/20 2247    Daleen Bo, MD 05/30/20 1136

## 2020-05-21 NOTE — ED Provider Notes (Signed)
I assumed care of this patient.  Please see previous provider note for further details of Hx, PE.  Briefly patient is a 79 y.o. female who presented with fatigue and left hip pain following fall 2 days ago. Found to have multifocal pna concerning for COVID. Pending confirmation and admission.   COVID negative. UA suspicious for UTI. Started on empiric abx.  Will call for admission.   [name], MD 05/22/20 [phone]

## 2020-05-22 ENCOUNTER — Encounter (HOSPITAL_COMMUNITY): Payer: Self-pay | Admitting: Internal Medicine

## 2020-05-22 ENCOUNTER — Inpatient Hospital Stay (HOSPITAL_COMMUNITY): Payer: Medicare PPO

## 2020-05-22 DIAGNOSIS — R8281 Pyuria: Secondary | ICD-10-CM | POA: Diagnosis present

## 2020-05-22 DIAGNOSIS — L97919 Non-pressure chronic ulcer of unspecified part of right lower leg with unspecified severity: Secondary | ICD-10-CM | POA: Diagnosis present

## 2020-05-22 DIAGNOSIS — L03119 Cellulitis of unspecified part of limb: Secondary | ICD-10-CM

## 2020-05-22 DIAGNOSIS — M7989 Other specified soft tissue disorders: Secondary | ICD-10-CM

## 2020-05-22 DIAGNOSIS — I129 Hypertensive chronic kidney disease with stage 1 through stage 4 chronic kidney disease, or unspecified chronic kidney disease: Secondary | ICD-10-CM | POA: Diagnosis present

## 2020-05-22 DIAGNOSIS — L03116 Cellulitis of left lower limb: Secondary | ICD-10-CM | POA: Diagnosis present

## 2020-05-22 DIAGNOSIS — R5381 Other malaise: Secondary | ICD-10-CM

## 2020-05-22 DIAGNOSIS — L538 Other specified erythematous conditions: Secondary | ICD-10-CM | POA: Diagnosis not present

## 2020-05-22 DIAGNOSIS — A419 Sepsis, unspecified organism: Principal | ICD-10-CM

## 2020-05-22 DIAGNOSIS — R52 Pain, unspecified: Secondary | ICD-10-CM

## 2020-05-22 DIAGNOSIS — Z6841 Body Mass Index (BMI) 40.0 and over, adult: Secondary | ICD-10-CM | POA: Diagnosis not present

## 2020-05-22 DIAGNOSIS — J189 Pneumonia, unspecified organism: Secondary | ICD-10-CM | POA: Diagnosis present

## 2020-05-22 DIAGNOSIS — R918 Other nonspecific abnormal finding of lung field: Secondary | ICD-10-CM | POA: Diagnosis present

## 2020-05-22 DIAGNOSIS — N1831 Chronic kidney disease, stage 3a: Secondary | ICD-10-CM

## 2020-05-22 DIAGNOSIS — E1165 Type 2 diabetes mellitus with hyperglycemia: Secondary | ICD-10-CM | POA: Diagnosis present

## 2020-05-22 DIAGNOSIS — I872 Venous insufficiency (chronic) (peripheral): Secondary | ICD-10-CM | POA: Diagnosis present

## 2020-05-22 DIAGNOSIS — Z794 Long term (current) use of insulin: Secondary | ICD-10-CM | POA: Diagnosis not present

## 2020-05-22 DIAGNOSIS — W19XXXA Unspecified fall, initial encounter: Secondary | ICD-10-CM | POA: Diagnosis present

## 2020-05-22 DIAGNOSIS — Y92019 Unspecified place in single-family (private) house as the place of occurrence of the external cause: Secondary | ICD-10-CM | POA: Diagnosis not present

## 2020-05-22 DIAGNOSIS — R531 Weakness: Secondary | ICD-10-CM

## 2020-05-22 DIAGNOSIS — E559 Vitamin D deficiency, unspecified: Secondary | ICD-10-CM | POA: Diagnosis present

## 2020-05-22 DIAGNOSIS — L97929 Non-pressure chronic ulcer of unspecified part of left lower leg with unspecified severity: Secondary | ICD-10-CM | POA: Diagnosis present

## 2020-05-22 DIAGNOSIS — L03115 Cellulitis of right lower limb: Secondary | ICD-10-CM | POA: Diagnosis present

## 2020-05-22 DIAGNOSIS — Z66 Do not resuscitate: Secondary | ICD-10-CM | POA: Diagnosis present

## 2020-05-22 DIAGNOSIS — L0390 Cellulitis, unspecified: Secondary | ICD-10-CM

## 2020-05-22 DIAGNOSIS — I2510 Atherosclerotic heart disease of native coronary artery without angina pectoris: Secondary | ICD-10-CM | POA: Diagnosis present

## 2020-05-22 DIAGNOSIS — B952 Enterococcus as the cause of diseases classified elsewhere: Secondary | ICD-10-CM | POA: Diagnosis present

## 2020-05-22 DIAGNOSIS — M25552 Pain in left hip: Secondary | ICD-10-CM | POA: Diagnosis present

## 2020-05-22 DIAGNOSIS — R7989 Other specified abnormal findings of blood chemistry: Secondary | ICD-10-CM

## 2020-05-22 DIAGNOSIS — Y92009 Unspecified place in unspecified non-institutional (private) residence as the place of occurrence of the external cause: Secondary | ICD-10-CM

## 2020-05-22 DIAGNOSIS — E785 Hyperlipidemia, unspecified: Secondary | ICD-10-CM | POA: Diagnosis present

## 2020-05-22 DIAGNOSIS — Z20822 Contact with and (suspected) exposure to covid-19: Secondary | ICD-10-CM | POA: Diagnosis present

## 2020-05-22 DIAGNOSIS — E1122 Type 2 diabetes mellitus with diabetic chronic kidney disease: Secondary | ICD-10-CM | POA: Diagnosis present

## 2020-05-22 DIAGNOSIS — R8271 Bacteriuria: Secondary | ICD-10-CM | POA: Diagnosis present

## 2020-05-22 LAB — LACTIC ACID, PLASMA: Lactic Acid, Venous: 1 mmol/L (ref 0.5–1.9)

## 2020-05-22 LAB — URINALYSIS, ROUTINE W REFLEX MICROSCOPIC
Bilirubin Urine: NEGATIVE
Glucose, UA: NEGATIVE mg/dL
Ketones, ur: NEGATIVE mg/dL
Nitrite: NEGATIVE
Protein, ur: 30 mg/dL — AB
Specific Gravity, Urine: 1.01 (ref 1.005–1.030)
WBC, UA: >50 WBC/hpf — ABNORMAL HIGH (ref 0–5)
pH: 6 (ref 5.0–8.0)

## 2020-05-22 LAB — RESPIRATORY PANEL BY PCR

## 2020-05-22 LAB — GLUCOSE, CAPILLARY
Glucose-Capillary: 129 mg/dL — ABNORMAL HIGH (ref 70–99)
Glucose-Capillary: 136 mg/dL — ABNORMAL HIGH (ref 70–99)

## 2020-05-22 LAB — CBG MONITORING, ED
Glucose-Capillary: 148 mg/dL — ABNORMAL HIGH (ref 70–99)
Glucose-Capillary: 148 mg/dL — ABNORMAL HIGH (ref 70–99)

## 2020-05-22 LAB — SARS CORONAVIRUS 2 BY RT PCR (HOSPITAL ORDER, PERFORMED IN ~~LOC~~ HOSPITAL LAB): SARS Coronavirus 2: NEGATIVE

## 2020-05-22 LAB — HEMOGLOBIN A1C
Hgb A1c MFr Bld: 10.3 % — ABNORMAL HIGH (ref 4.8–5.6)
Mean Plasma Glucose: 248.91 mg/dL

## 2020-05-22 MED ORDER — ATORVASTATIN CALCIUM 20 MG PO TABS
20.0000 mg | ORAL_TABLET | Freq: Every day | ORAL | Status: DC
  Administered 2020-05-22 – 2020-05-26 (×5): 20 mg via ORAL
  Filled 2020-05-22 (×5): qty 1

## 2020-05-22 MED ORDER — INSULIN ASPART 100 UNIT/ML ~~LOC~~ SOLN
0.0000 [IU] | Freq: Three times a day (TID) | SUBCUTANEOUS | Status: DC
  Administered 2020-05-22: 2 [IU] via SUBCUTANEOUS
  Administered 2020-05-23 – 2020-05-25 (×6): 3 [IU] via SUBCUTANEOUS
  Administered 2020-05-25: 5 [IU] via SUBCUTANEOUS
  Administered 2020-05-25: 3 [IU] via SUBCUTANEOUS
  Administered 2020-05-26 (×2): 5 [IU] via SUBCUTANEOUS
  Administered 2020-05-26: 2 [IU] via SUBCUTANEOUS
  Filled 2020-05-22: qty 0.15

## 2020-05-22 MED ORDER — INSULIN ASPART 100 UNIT/ML ~~LOC~~ SOLN
0.0000 [IU] | Freq: Three times a day (TID) | SUBCUTANEOUS | Status: DC
  Administered 2020-05-22 (×2): 2 [IU] via SUBCUTANEOUS
  Filled 2020-05-22: qty 0.15

## 2020-05-22 MED ORDER — METOPROLOL TARTRATE 25 MG PO TABS
12.5000 mg | ORAL_TABLET | Freq: Two times a day (BID) | ORAL | Status: DC
  Administered 2020-05-22 – 2020-05-26 (×7): 12.5 mg via ORAL
  Filled 2020-05-22 (×8): qty 1

## 2020-05-22 MED ORDER — SODIUM CHLORIDE 0.9 % IV SOLN
1.0000 g | Freq: Once | INTRAVENOUS | Status: AC
  Administered 2020-05-22: 1 g via INTRAVENOUS
  Filled 2020-05-22: qty 10

## 2020-05-22 MED ORDER — SODIUM CHLORIDE 0.9 % IV SOLN
1.0000 g | INTRAVENOUS | Status: DC
  Administered 2020-05-23: 1 g via INTRAVENOUS
  Filled 2020-05-22: qty 1

## 2020-05-22 MED ORDER — SODIUM CHLORIDE (PF) 0.9 % IJ SOLN
INTRAMUSCULAR | Status: AC
  Filled 2020-05-22: qty 50

## 2020-05-22 MED ORDER — SODIUM CHLORIDE 0.9 % IV BOLUS
1000.0000 mL | Freq: Once | INTRAVENOUS | Status: AC
  Administered 2020-05-22: 1000 mL via INTRAVENOUS

## 2020-05-22 MED ORDER — IOHEXOL 350 MG/ML SOLN
100.0000 mL | Freq: Once | INTRAVENOUS | Status: AC | PRN
  Administered 2020-05-22: 80 mL via INTRAVENOUS

## 2020-05-22 MED ORDER — ACETAMINOPHEN 650 MG RE SUPP: 650.0000 mg | Freq: Four times a day (QID) | RECTAL | Status: DC | PRN

## 2020-05-22 MED ORDER — INSULIN ASPART 100 UNIT/ML ~~LOC~~ SOLN
0.0000 [IU] | Freq: Every day | SUBCUTANEOUS | Status: DC
  Administered 2020-05-25: 2 [IU] via SUBCUTANEOUS
  Filled 2020-05-22: qty 0.05

## 2020-05-22 MED ORDER — SODIUM CHLORIDE 0.9 % IV SOLN: 500.0000 mg | INTRAVENOUS | Status: DC

## 2020-05-22 MED ORDER — SODIUM CHLORIDE 0.9 % IV SOLN
500.0000 mg | Freq: Once | INTRAVENOUS | Status: AC
  Administered 2020-05-22: 500 mg via INTRAVENOUS
  Filled 2020-05-22: qty 500

## 2020-05-22 MED ORDER — ENOXAPARIN SODIUM 40 MG/0.4ML ~~LOC~~ SOLN
40.0000 mg | SUBCUTANEOUS | Status: DC
  Administered 2020-05-22 – 2020-05-26 (×5): 40 mg via SUBCUTANEOUS
  Filled 2020-05-22 (×5): qty 0.4

## 2020-05-22 MED ORDER — GABAPENTIN 100 MG PO CAPS
100.0000 mg | ORAL_CAPSULE | Freq: Every day | ORAL | Status: DC
  Administered 2020-05-22 – 2020-05-25 (×4): 100 mg via ORAL
  Filled 2020-05-22 (×4): qty 1

## 2020-05-22 MED ORDER — INSULIN ASPART 100 UNIT/ML ~~LOC~~ SOLN
6.0000 [IU] | Freq: Three times a day (TID) | SUBCUTANEOUS | Status: DC
  Filled 2020-05-22: qty 0.06

## 2020-05-22 MED ORDER — ACETAMINOPHEN 325 MG PO TABS
650.0000 mg | ORAL_TABLET | Freq: Four times a day (QID) | ORAL | Status: DC | PRN
  Administered 2020-05-22 – 2020-05-24 (×3): 650 mg via ORAL
  Filled 2020-05-22 (×3): qty 2

## 2020-05-22 NOTE — H&P (Signed)
History and Physical    Julie Mora BTD:[id number] DOB: 01-08-1941 DOA: 05/21/2020  PCP: Elby Showers, MD Patient coming from: Home  Chief Complaint: Fall  HPI: Julie Mora is a 79 y.o. female with medical history significant of hypertension, hyperlipidemia, insulin-dependent type 2 diabetes, CKD stage III presenting to the ED with complaints of generalized weakness, recent fall, and left hip pain.  Patient reports having generalized weakness and poor appetite.  States 2 days ago while trying to sit down in her recliner, she missed the recliner and fell on the floor and injured her left hip.  Denies injuring her head or sustaining any other injuries from the fall.  Denies fevers.  States for the past few days she has had trouble breathing.  She is coughing slightly and using lozenges.  Patient states she received 2 doses of the Pfizer Covid vaccine about 3 months ago.  Denies nausea, vomiting, abdominal pain, diarrhea, or dysuria.  States her legs are always swollen and weeping.  She takes a fluid pill at home.  Denies history of blood clots.  ED Course: Febrile with temperature 101.3 F.  Tachycardic with heart rate up to 120s.  Slightly tachypneic.  Not hypotensive or hypoxic.  WBC 8.2, hemoglobin 13.6, hematocrit 42.5, platelet 218.  Sodium 138, potassium 4.7, chloride 105, bicarb 24, BUN 27, creatinine 1.2, glucose 185.  Creatinine was 1.1 on previous labs.  LFTs normal.  SARS-CoV-2 PCR test negative.  UA with large amount of leukocytes, greater than 50 WBCs, and rare bacteria.  Lactic acid 1.3.  Blood culture x2 pending.  Procalcitonin <0.10. Inflammatory markers elevated: D-dimer 1.75, CRP 3.7, and fibrinogen 482.  VBG with pH 7.32.  RVP pending.  Chest x-ray showing patchy heterogenous opacities in the periphery of the left lung and more mildly in the right lung base suspicious for atypical/viral pneumonia.  X-ray of left hip/pelvis negative for fracture or dislocation.  Patient  received Tylenol, ceftriaxone, azithromycin, and 1 L normal saline bolus.  Review of Systems:  All systems reviewed and apart from history of presenting illness, are negative.  Past Medical History:  Diagnosis Date  . Anemia   . Essential hypertension, benign   . Incontinence   . Other and unspecified hyperlipidemia   . Pemphigus foliaceous   . Renal insufficiency   . Type II or unspecified type diabetes mellitus without mention of complication, not stated as uncontrolled   . Vitamin D deficiency     Past Surgical History:  Procedure Laterality Date  . CHOLECYSTECTOMY N/A 06/08/2017   Procedure: LAPAROSCOPIC CHOLECYSTECTOMY;  Surgeon: Kinsinger, Arta Bruce, MD;  Location: [facility];  Service: General;  Laterality: N/A;     reports that she has never smoked. She has never used smokeless tobacco. She reports that she does not drink alcohol and does not use drugs.  Allergies  Allergen Reactions  . Azathioprine Shortness Of Breath and Other (See Comments)    severly allergic. nausea, diarrhea, low blood pressure, fever, chills, rash. was hospitalized due to side effects in feb 2012.  . Cellcept [Mycophenolate Mofetil] Nausea Only    severe nausea  . Ramipril Cough    Family History  Problem Relation Age of Onset  . Pancreatic cancer Father 41  . Dementia Mother 5    Prior to Admission medications   Medication Sig Start Date End Date Taking? Authorizing Provider  ACCU-CHEK AVIVA PLUS test strip USE TO CHECK BLOOD SUGAR BEFORE BREAKFAST AND SUPPER 11/09/14   Elby Showers, MD  acetaminophen (TYLENOL) 500 MG tablet Take 500 mg by mouth every 6 (six) hours as needed for pain.    [provider]  alendronate (FOSAMAX) 70 MG tablet TAKE 1 TABLET(70 MG) BY MOUTH EVERY 7 DAYS WITH A FULL GLASS OF WATER AND ON AN EMPTY STOMACH 04/29/19   Elby Showers, MD  aspirin EC 81 MG tablet Take by mouth.    [provider]  BD PEN NEEDLE NANO U/F 32G X 4 MM MISC USE AS DIRECTED  01/11/15   Elby Showers, MD  CALCIUM ASCORBATE PO Take 1 tablet by mouth daily.    [provider]  Cholecalciferol (VITAMIN D) 2000 UNITS tablet Take 2,000 Units by mouth daily.      [provider]  clotrimazole-betamethasone (LOTRISONE) lotion Apply to both feet twice daily 07/29/18   Marzetta Board, DPM  ferrous gluconate (FERGON) 324 MG tablet TAKE 1 TABLET(324 MG) BY MOUTH DAILY WITH BREAKFAST 09/14/18   Elby Showers, MD  gabapentin (NEURONTIN) 100 MG capsule Take 100 mg by mouth daily. 07/09/19   [provider]  insulin aspart (NOVOLOG) 100 UNIT/ML injection Inject 5-12 Units into the skin 2 (two) times daily as needed for high blood sugar. Patient taking differently: Inject 4-14 Units into the skin 3 (three) times daily with meals.  12/15/14   Elby Showers, MD  Insulin Glargine (LANTUS SOLOSTAR) 100 UNIT/ML Solostar Pen Inject 35 units in skin at bedtime Patient taking differently: Inject 20-45 Units into the skin 2 (two) times daily. 20 units in the AM and  45 units in the PM 01/01/14   Elby Showers, MD  insulin lispro (HUMALOG) 100 UNIT/ML KiwkPen  05/29/09   [provider]  Lactobacillus Rhamnosus, GG, (CULTURELLE PO) Take 1 capsule by mouth daily.    [provider]  levofloxacin (LEVAQUIN) 250 MG tablet Take 1 tablet (250 mg total) by mouth daily. 08/25/19   Elby Showers, MD  metFORMIN (GLUCOPHAGE) 500 MG tablet  02/10/09   [provider]  multivitamin [facility]) per tablet Take 1 tablet by mouth daily.      [provider]  MYRBETRIQ 50 MG TB24 tablet TK 1 T PO QD 11/14/18   [provider]  nystatin cream (MYCOSTATIN) Apply 1 application topically 2 (two) times daily. 03/02/17   Elby Showers, MD  Polyethyl Glycol-Propyl Glycol (SYSTANE) 0.4-0.3 % SOLN Apply 1 drop to eye 3 (three) times daily. Both eyes, as needed    [provider]  potassium chloride (K-DUR,KLOR-CON) 10 MEQ tablet  06/25/10    [provider]  potassium chloride (KLOR-CON) 10 MEQ tablet TAKE 1 TABLET(10 MEQ) BY MOUTH DAILY 09/16/19   Elby Showers, MD  prednisoLONE acetate (PRED FORTE) 1 % ophthalmic suspension Place 1 drop into both eyes 2 (two) times daily.  05/08/12   [provider]  silver sulfADIAZINE (SILVADENE) 1 % cream  09/06/10   [provider]  simvastatin (ZOCOR) 20 MG tablet TAKE 1 TABLET BY MOUTH EVERY NIGHT AT BEDTIME 04/18/19   Elby Showers, MD  tacrolimus (PROTOPIC) 0.1 % ointment  08/23/10   [provider]  torsemide (DEMADEX) 20 MG tablet TAKE 1 TABLET(20 MG) BY MOUTH DAILY 07/17/19   Elby Showers, MD  triamcinolone cream (KENALOG) 0.1 % APP AA QD UTD 03/28/19   [provider]    Physical Exam: Vitals:   05/22/20 0527 05/22/20 0530 05/22/20 0600 05/22/20 0645  BP: 123/76 123/76 96/83  136/82  Pulse: (!) 106 (!) 102 (!) 128 (!) 132  Resp: (!) 23 (!) 22 (!) 42 (!) 31  Temp:      TempSrc:      SpO2: 100% 100% 99% 100%    Physical Exam Constitutional:      General: She is not in acute distress. HENT:     Head: Normocephalic and atraumatic.     Mouth/Throat:     Mouth: Mucous membranes are dry.     Pharynx: Oropharynx is clear.  Cardiovascular:     Rate and Rhythm: Regular rhythm. Tachycardia present.     Pulses: Normal pulses.     Comments: Tachycardic with heart rate in the 120s Pulmonary:     Breath sounds: No wheezing or rales.     Comments: Satting 100% on room air Slightly tachypneic Abdominal:     General: Bowel sounds are normal. There is no distension.     Palpations: Abdomen is soft.     Tenderness: There is no abdominal tenderness.  Musculoskeletal:        General: Swelling present.     Cervical back: Normal range of motion and neck supple.     Comments: +4 pitting edema bilateral lower extremities with blisters weeping serous fluid  Skin:    General: Skin is warm and dry.     Comments: Severe venous stasis dermatitis  of bilateral lower legs  Significant erythema and warmth to touch of bilateral lower legs Erythema and warmth to touch also noted on the dorsum of the left foot   Neurological:     Mental Status: She is alert and oriented to person, place, and time.  Psychiatric:        Behavior: Behavior normal.     Labs on Admission: I have personally reviewed following labs and imaging studies  CBC: Recent Labs  Lab 05/21/20 2245  WBC 8.2  NEUTROABS 6.1  HGB 13.6  HCT 42.5  MCV 99.5  PLT 885   Basic Metabolic Panel: Recent Labs  Lab 05/21/20 2245  NA 138  K 4.7  CL 105  CO2 24  GLUCOSE 185*  BUN 27*  CREATININE 1.25*  CALCIUM 8.8*   GFR: CrCl cannot be calculated (Unknown ideal weight.). Liver Function Tests: Recent Labs  Lab 05/21/20 2245  AST 27  ALT 15  ALKPHOS 72  BILITOT 0.3  PROT 7.0  ALBUMIN 2.9*   No results for input(s): LIPASE, AMYLASE in the last 168 hours. No results for input(s): AMMONIA in the last 168 hours. Coagulation Profile: No results for input(s): INR, PROTIME in the last 168 hours. Cardiac Enzymes: No results for input(s): CKTOTAL, CKMB, CKMBINDEX, TROPONINI in the last 168 hours. BNP (last 3 results) No results for input(s): PROBNP in the last 8760 hours. HbA1C: No results for input(s): HGBA1C in the last 72 hours. CBG: No results for input(s): GLUCAP in the last 168 hours. Lipid Profile: No results for input(s): CHOL, HDL, LDLCALC, TRIG, CHOLHDL, LDLDIRECT in the last 72 hours. Thyroid Function Tests: No results for input(s): TSH, T4TOTAL, FREET4, T3FREE, THYROIDAB in the last 72 hours. Anemia Panel: Recent Labs    05/21/20 2245  FERRITIN 203   Urine analysis:    Component Value Date/Time   COLORURINE YELLOW 05/21/2020 2206   APPEARANCEUR CLOUDY (A) 05/21/2020 2206   LABSPEC 1.010 05/21/2020 2206   PHURINE 6.0 05/21/2020 2206   GLUCOSEU NEGATIVE 05/21/2020 2206   HGBUR SMALL (A) 05/21/2020 2206   BILIRUBINUR NEGATIVE 05/21/2020  2206  BILIRUBINUR NEG 08/21/2019 1134   KETONESUR NEGATIVE 05/21/2020 2206   PROTEINUR 30 (A) 05/21/2020 2206   UROBILINOGEN 0.2 08/21/2019 1134   UROBILINOGEN 0.2 12/11/2010 1434   NITRITE NEGATIVE 05/21/2020 2206   LEUKOCYTESUR LARGE (A) 05/21/2020 2206    Radiological Exams on Admission: DG Chest 2 View  Result Date: 05/21/2020 CLINICAL DATA:  Fever weakness EXAM: CHEST - 2 VIEW COMPARISON:  Radiograph 06/10/2017 FINDINGS: Patchy heterogeneous opacities are seen in the periphery of the left lung and more mildly in right lung base. No pneumothorax or effusion. Cardiomediastinal contours are similar to priors accounting for differences in technique. No acute osseous or soft tissue abnormality. Degenerative changes are present in the imaged spine and shoulders. Few remote right rib deformities are again noted. IMPRESSION: Patchy heterogeneous opacities in the periphery of the left lung and more mildly in the right lung base, suspicious for pneumonia including atypical viral etiologies such as COVID-19. Electronically Signed   By: Lovena Le M.D.   On: 05/21/2020 22:30   DG HIP UNILAT W OR W/O PELVIS 2-3 VIEWS LEFT  Result Date: 05/22/2020 CLINICAL DATA:  Fall EXAM: DG HIP (WITH OR WITHOUT PELVIS) 2-3V LEFT COMPARISON:  None. FINDINGS: There is no evidence of hip fracture or dislocation. There is no evidence of arthropathy or other focal bone abnormality. IMPRESSION: Negative. Electronically Signed   By: Ulyses Jarred M.D.   On: 05/22/2020 00:19    EKG: Not done in the ED.  EKG has been ordered now and currently pending.  Assessment/Plan Principal Problem:   Pneumonia Active Problems:   Sepsis ([place])   UTI (urinary tract infection)   Cellulitis   Fall at home, initial encounter   Sepsis secondary to atypical pneumonia: Patient is not hypoxic.  Chest x-ray showing patchy heterogenous opacities in the periphery of the left lung and more mildly in the right lung base suspicious for  atypical/viral pneumonia. Bacterial pneumonia less likely given no leukocytosis and procalcitonin <0.10. Inflammatory markers elevated: D-dimer 1.75, CRP 3.7, and fibrinogen 482.  However, SARS-CoV-2 PCR test negative and patient reports receiving 2 doses of Pfizer Covid vaccine about 3 months ago.  Meets sepsis criteria -3 SIRS criteria (fever with temperature 101.3 F, HR >90, RR >20) and source of infection present.  Lactic acid normal. -Received 1 L fluid bolus in the ED and continues to be tachycardic with heart rate in the 120s.  Order additional 1 L fluid bolus. -CT angiogram chest ordered to further evaluate chest x-ray findings and to rule out PE given elevated D-dimer -Continue ceftriaxone and azithromycin at this time.  Trend procalcitonin. -RVP pending, droplet precautions -Continuous pulse ox, supplemental oxygen if needed -Blood culture x2 pending -Check second set of lactate  UTI: UA with large amount of leukocytes, greater than 50 WBCs, and rare bacteria. -Continue ceftriaxone.  Urine culture ordered.  Cellulitis of bilateral lower extremities: Patient has significant erythema and warmth of bilateral lower legs concerning for possible cellulitis. -Continue ceftriaxone  Bilateral lower extremity edema: Suspect due to venous stasis given presence of severe venous stasis dermatitis.  No documented history of CHF.  Chest x-ray not suggestive of pulmonary edema. -Hold Lasix at this time given concern for sepsis -Bilateral lower extremity Dopplers ordered to rule out DVT given elevated D-dimer.  Generalized weakness/recent fall -PT/OT evaluation, fall precautions  Hyperlipidemia -Resume statin after pharmacy med rec is done  Insulin-dependent type 2 diabetes -Check A1c.  Sliding scale insulin moderate with meals.  Resume home basal insulin after  pharmacy med rec is done.  CKD stage III: Stable.  Renal function at baseline. -Continue to monitor  DVT prophylaxis: Lovenox Code  Status: Patient wishes to be DNR. Family Communication: No family available at this time. Disposition Plan: Status is: Inpatient  Remains inpatient appropriate because:IV treatments appropriate due to intensity of illness or inability to take PO and Inpatient level of care appropriate due to severity of illness   Dispo: The patient is from: Home              Anticipated d/c is to: SNF              Anticipated d/c date is: > 3 days              Patient currently is not medically stable to d/c.  The medical decision making on this patient was of high complexity and the patient is at high risk for clinical deterioration, therefore this is a level 3 visit.  Shela Leff MD [facility]  If 7PM-7AM, please contact night-coverage [email or web address]  05/22/2020, 6:52 AM

## 2020-05-22 NOTE — Progress Notes (Signed)
Lower extremity venous bilateral study completed.   Please see CV Proc for preliminary results.   Julie Mora  

## 2020-05-22 NOTE — Progress Notes (Signed)
PROGRESS NOTE  Julie Mora QIW:[id number] DOB: [birth year]   PCP: Elby Showers, MD  Patient is from: Home.  Lives alone.  Uses Rollator at baseline.  DOA: 05/21/2020 LOS: 0  Brief Narrative / Interim history: 79 year old female with history of IDDM-2, CKD-3, HTN, HLD, CVA, DVT not on anticoagulation, debility, chronic venous insufficiency and morbid obesity presenting with generalized weakness, poor p.o. intake and left hip pain after her recent accidental fall when she missed her recliner as she tried to sit down.  She did not strike her head or loss consciousness after fall.  She is vaccinated against COVID-19.  In ED, febrile to 101.3.  HR in 120s.  Slightly tachypneic.  Not hypoxic or hypotensive.  WBC 8.2.  CMP without significant finding.  COVID-19 PCR negative.  UA with large leukocytes and rare bacteria.  Lactic acid 1.3.  Pro-Cal less than 0.10.  VBG normal.  Slightly elevated inflammatory markers including D-dimer and CRP. CXR with patchy heterogeneous opacities in the periphery of the left lung and more mildly in the right lung bases.  Hip and pelvis x-ray without acute finding.  RVP, blood cultures and LLE [place] ordered.  Patient was admitted for sepsis due to atypical pneumonia, UTI and bilateral lower extremity cellulitis.  She was a started on ceftriaxone and azithromycin.   RVP and bilateral lower extremity venous Doppler negative.  Blood cultures NGTD.  CTA chest negative for PE or pneumonia but chronic coronary artery calcification.  Urine culture pending.  Subjective: Seen and examined earlier this morning.  No major events.  She says she is uncomfortable lying in the hospital bed.  She says she would be better if she is at home.  She denies shortness of breath, cough, chest pain, palpitation, nausea, vomiting, diarrhea or UTI symptoms.  She reports vague abdominal pain.  Does not recall her last bowel movement.  Objective: Vitals:   05/22/20 0645 05/22/20 0700 05/22/20 0912  05/22/20 1227  BP: 136/82 107/64 114/84 125/70  Pulse: (!) 132 (!) 114 (!) 112 94  Resp: (!) 31 19 18  (!) 22  Temp:   99.8 F (37.7 C)   TempSrc:   Oral Oral  SpO2: 100% 96% 97% (!) 25%  Weight:    115.7 kg  Height:    5\' 2"  (1.575 m)   No intake or output data in the 24 hours ending 05/22/20 1410 Filed Weights   05/22/20 1227  Weight: 115.7 kg    Examination:  GENERAL: No apparent distress.  Nontoxic. HEENT: MMM.  Vision and hearing grossly intact.  NECK: Supple.  No apparent JVD.  RESP: 97% on RA.  No IWOB.  Fair aeration bilaterally. CVS:  RRR. Heart sounds normal.  ABD/GI/GU: BS+. Abd soft.  Diffuse tenderness to palpation. MSK/EXT:  Moves extremities.  Chronic venous insufficiency with BLE skin induration. SKIN: Bilateral lower extremity skin induration.  Increased warmth to touch in LLE. NEURO: Awake, alert and oriented appropriately.  No apparent focal neuro deficit. PSYCH: Calm. Normal affect.   Procedures:  None  Microbiology summarized: COVID-19 PCR negative. RVP negative. Blood cultures NGTD. Urine culture pending.  Assessment & Plan: Sepsis secondary to BLE cellulitis likely due to chronic venous insufficiency/lymphedema: Significant skin induration and increased warmth to touch on exam.  Doubt pneumonia or UTI without respiratory and UTI symptoms despite chest x-ray finding.  RVP, procalcitonin and CTA chest negative.  Blood cultures NGTD.  Her UA is not convincing for UTI.  She admits sepsis criteria with fever, tachycardia,  tachypnea and lower extremity cellulitis.  -Continue IV ceftriaxone -Discontinue azithromycin  -She may benefit from Unna boot   Uncontrolled IDDM-2 with hyperglycemia: A1c 10.3%. Recent Labs  Lab 05/22/20 0754 05/22/20 1205  GLUCAP 148* 148*  -Continue SSI-moderate -Resume home statin after med rec.  CKD-3A: Stable. -Continue to monitor  Generalized weakness/recent fall/debility-uses Rollator at home.  Lives alone. -PT/OT  evaluation,  -fall precautions  Hyperlipidemia: Stable. -Resume statin after pharmacy med rec is done  Pneumonia-ruled out.  Pyuria: Denies UTI symptoms.  UTI ruled out.  Elevated D-dimer: PE and DVT ruled out.  COVID-19 PCR negative.  Home medications -We will request pharmacy tech to complete med rec before resuming.  Morbid obesity Body mass index is 46.64 kg/m.       Pressure Injury 01/20/17 Stage II -  Partial thickness loss of dermis presenting as a shallow open ulcer with a red, pink wound bed without slough. (Active)  01/20/17 2039  Location: Buttocks  Location Orientation: Left  Staging: Stage II -  Partial thickness loss of dermis presenting as a shallow open ulcer with a red, pink wound bed without slough.  Wound Description (Comments):   Present on Admission: Yes   DVT prophylaxis:  enoxaparin (LOVENOX) injection 40 mg Start: 05/22/20 1000  Code Status: DNR/DNI Family Communication: Patient and/or RN. Available if any question.  Status is: Inpatient  Remains inpatient appropriate because:IV treatments appropriate due to intensity of illness or inability to take PO and Inpatient level of care appropriate due to severity of illness   Dispo: The patient is from: Home              Anticipated d/c is to: To be determined              Anticipated d/c date is: 2 days              Patient currently is not medically stable to d/c.       Consultants:  None   Sch Meds:  Scheduled Meds: . enoxaparin (LOVENOX) injection  40 mg Subcutaneous Q24H  . insulin aspart  0-15 Units Subcutaneous TID WC  . sodium chloride (PF)       Continuous Infusions: . [START ON 05/23/2020] cefTRIAXone (ROCEPHIN)  IV     PRN Meds:.acetaminophen **OR** acetaminophen  Antimicrobials: Anti-infectives (From admission, onward)   Start     Dose/Rate Route Frequency Ordered Stop   05/23/20 0200  azithromycin (ZITHROMAX) 500 mg in sodium chloride 0.9 % 250 mL IVPB  Status:   Discontinued        500 mg 250 mL/hr over 60 Minutes Intravenous Every 24 hours 05/22/20 0553 05/22/20 1410   05/23/20 0200  cefTRIAXone (ROCEPHIN) 1 g in sodium chloride 0.9 % 100 mL IVPB        1 g 200 mL/hr over 30 Minutes Intravenous Every 24 hours 05/22/20 0553     05/22/20 0115  cefTRIAXone (ROCEPHIN) 1 g in sodium chloride 0.9 % 100 mL IVPB        1 g 200 mL/hr over 30 Minutes Intravenous  Once 05/22/20 0102 05/22/20 0151   05/22/20 0115  azithromycin (ZITHROMAX) 500 mg in sodium chloride 0.9 % 250 mL IVPB        500 mg 250 mL/hr over 60 Minutes Intravenous  Once 05/22/20 0102 05/22/20 0309       I have personally reviewed the following labs and images: CBC: Recent Labs  Lab 05/21/20 2245  WBC 8.2  NEUTROABS 6.1  HGB 13.6  HCT 42.5  MCV 99.5  PLT 218   BMP &GFR Recent Labs  Lab 05/21/20 2245  NA 138  K 4.7  CL 105  CO2 24  GLUCOSE 185*  BUN 27*  CREATININE 1.25*  CALCIUM 8.8*   Estimated Creatinine Clearance: 44.7 mL/min (A) (by C-G formula based on SCr of 1.25 mg/dL (H)). Liver & Pancreas: Recent Labs  Lab 05/21/20 2245  AST 27  ALT 15  ALKPHOS 72  BILITOT 0.3  PROT 7.0  ALBUMIN 2.9*   No results for input(s): LIPASE, AMYLASE in the last 168 hours. No results for input(s): AMMONIA in the last 168 hours. Diabetic: Recent Labs    05/21/20 2245  HGBA1C 10.3*   Recent Labs  Lab 05/22/20 0754 05/22/20 1205  GLUCAP 148* 148*   Cardiac Enzymes: No results for input(s): CKTOTAL, CKMB, CKMBINDEX, TROPONINI in the last 168 hours. No results for input(s): PROBNP in the last 8760 hours. Coagulation Profile: No results for input(s): INR, PROTIME in the last 168 hours. Thyroid Function Tests: No results for input(s): TSH, T4TOTAL, FREET4, T3FREE, THYROIDAB in the last 72 hours. Lipid Profile: No results for input(s): CHOL, HDL, LDLCALC, TRIG, CHOLHDL, LDLDIRECT in the last 72 hours. Anemia Panel: Recent Labs    05/21/20 2245  FERRITIN 203    Urine analysis:    Component Value Date/Time   COLORURINE YELLOW 05/21/2020 2206   APPEARANCEUR CLOUDY (A) 05/21/2020 2206   LABSPEC 1.010 05/21/2020 2206   PHURINE 6.0 05/21/2020 2206   GLUCOSEU NEGATIVE 05/21/2020 2206   HGBUR SMALL (A) 05/21/2020 2206   BILIRUBINUR NEGATIVE 05/21/2020 2206   BILIRUBINUR NEG 08/21/2019 1134   KETONESUR NEGATIVE 05/21/2020 2206   PROTEINUR 30 (A) 05/21/2020 2206   UROBILINOGEN 0.2 08/21/2019 1134   UROBILINOGEN 0.2 12/11/2010 1434   NITRITE NEGATIVE 05/21/2020 2206   LEUKOCYTESUR LARGE (A) 05/21/2020 2206   Sepsis Labs: Invalid input(s): PROCALCITONIN, [place]  Microbiology: Recent Results (from the past 240 hour(s))  SARS Coronavirus 2 by RT PCR (hospital order, performed in [facility] hospital lab) Nasopharyngeal Nasopharyngeal Swab     Status: None   Collection Time: 05/21/20 10:45 PM   Specimen: Nasopharyngeal Swab  Result Value Ref Range Status   SARS Coronavirus 2 NEGATIVE NEGATIVE Final    Comment: (NOTE) SARS-CoV-2 target nucleic acids are NOT DETECTED.  The SARS-CoV-2 RNA is generally detectable in upper and lower respiratory specimens during the acute phase of infection. The lowest concentration of SARS-CoV-2 viral copies this assay can detect is 250 copies / mL. A negative result does not preclude SARS-CoV-2 infection and should not be used as the sole basis for treatment or other patient management decisions.  A negative result may occur with improper specimen collection / handling, submission of specimen other than nasopharyngeal swab, presence of viral mutation(s) within the areas targeted by this assay, and inadequate number of viral copies (<250 copies / mL). A negative result must be combined with clinical observations, patient history, and epidemiological information.  Fact Sheet for Patients:   StrictlyIdeas.no  Fact Sheet for Healthcare  Providers: BankingDealers.co.za  This test is not yet approved or  cleared by the [place] FDA and has been authorized for detection and/or diagnosis of SARS-CoV-2 by FDA under an Emergency Use Authorization (EUA).  This EUA will remain in effect (meaning this test can be used) for the duration of the COVID-19 declaration under Section 564(b)(1) of the Act, 21 U.S.C. section 360bbb-3(b)(1), unless the authorization is terminated or  revoked sooner.  Performed at [facility], [place] [address], [place], [place] [address]   Culture, blood (routine x 2)     Status: None (Preliminary result)   Collection Time: 05/21/20 10:45 PM   Specimen: BLOOD RIGHT ARM  Result Value Ref Range Status   Specimen Description   Final    BLOOD RIGHT ARM Performed at [facility], [place] [address] [place], [place] [address]    Special Requests   Final    BOTTLES DRAWN AEROBIC AND ANAEROBIC Blood Culture adequate volume Performed at [place] [address], [place], [place] [address]    Culture PENDING  Incomplete   Report Status PENDING  Incomplete  Respiratory Panel by PCR     Status: None   Collection Time: 05/22/20  2:40 AM   Specimen: Nasopharyngeal Swab; Respiratory  Result Value Ref Range Status   Adenovirus NOT DETECTED NOT DETECTED Final   Coronavirus 229E NOT DETECTED NOT DETECTED Final    Comment: (NOTE) The Coronavirus on the Respiratory Panel, DOES NOT test for the novel  Coronavirus (2019 nCoV)    Coronavirus HKU1 NOT DETECTED NOT DETECTED Final   Coronavirus NL63 NOT DETECTED NOT DETECTED Final   Coronavirus OC43 NOT DETECTED NOT DETECTED Final   Metapneumovirus NOT DETECTED NOT DETECTED Final   Rhinovirus / Enterovirus NOT DETECTED NOT DETECTED Final   Influenza A NOT DETECTED NOT DETECTED Final   Influenza B NOT DETECTED NOT DETECTED Final   Parainfluenza Virus 1 NOT DETECTED NOT DETECTED Final    Parainfluenza Virus 2 NOT DETECTED NOT DETECTED Final   Parainfluenza Virus 3 NOT DETECTED NOT DETECTED Final   Parainfluenza Virus 4 NOT DETECTED NOT DETECTED Final   Respiratory Syncytial Virus NOT DETECTED NOT DETECTED Final   Bordetella pertussis NOT DETECTED NOT DETECTED Final   Chlamydophila pneumoniae NOT DETECTED NOT DETECTED Final   Mycoplasma pneumoniae NOT DETECTED NOT DETECTED Final    Comment: Performed at [facility], [place]. [address] [place], [place] [address]    Radiology Studies: DG Chest 2 View  Result Date: 05/21/2020 CLINICAL DATA:  Fever weakness EXAM: CHEST - 2 VIEW COMPARISON:  Radiograph 06/10/2017 FINDINGS: Patchy heterogeneous opacities are seen in the periphery of the left lung and more mildly in right lung base. No pneumothorax or effusion. Cardiomediastinal contours are similar to priors accounting for differences in technique. No acute osseous or soft tissue abnormality. Degenerative changes are present in the imaged spine and shoulders. Few remote right rib deformities are again noted. IMPRESSION: Patchy heterogeneous opacities in the periphery of the left lung and more mildly in the right lung base, suspicious for pneumonia including atypical viral etiologies such as COVID-19. Electronically Signed   By: Lovena Le M.D.   On: 05/21/2020 22:30   CT ANGIO CHEST PE W OR WO CONTRAST  Result Date: 05/22/2020 CLINICAL DATA:  Fatigue, hip pain following fall 2 days ago. Multifocal pneumonia concerning for COVID. PE suspected, high probability. EXAM: CT ANGIOGRAPHY CHEST WITH CONTRAST TECHNIQUE: Multidetector CT imaging of the chest was performed using the standard protocol during bolus administration of intravenous contrast. Multiplanar CT image reconstructions and MIPs were obtained to evaluate the vascular anatomy. CONTRAST:  43mL OMNIPAQUE IOHEXOL 350 MG/ML SOLN COMPARISON:  Chest x-ray dated 05/21/2020. FINDINGS: Cardiovascular: Majority of the peripheral segmental  and subsegmental pulmonary artery branches are difficult to characterize due to patient breathing motion artifact. There is no pulmonary embolism seen within the main or  central lobar pulmonary arteries. Aortic atherosclerosis. No thoracic aortic aneurysm no pericardial effusion. Coronary artery calcifications. Mediastinum/Nodes: No mass or enlarged lymph nodes are seen within the mediastinum. Esophagus is unremarkable. Trachea is unremarkable. Lungs/Pleura: Subtle mosaic pattern within each lung, LEFT greater than RIGHT, suggesting air trapping. No consolidation or confluent ground-glass opacity to suggest pneumonia. No pleural effusion or pneumothorax. Upper Abdomen: Limited images of the upper abdomen are unremarkable. Musculoskeletal: Degenerative spondylosis of the kyphotic thoracic spine, moderate in degree. No acute appearing osseous abnormality. Review of the MIP images confirms the above findings. IMPRESSION: 1. No acute findings. No evidence of pneumonia or pulmonary edema. No pulmonary embolism seen, with study limitations detailed above. 2. Subtle mosaic pattern within each lung, LEFT greater than RIGHT, suggesting air trapping, likely chronic. 3. Coronary artery calcifications. Aortic Atherosclerosis (ICD10-I70.0). Electronically Signed   By: Franki Cabot M.D.   On: 05/22/2020 07:17   DG HIP UNILAT W OR W/O PELVIS 2-3 VIEWS LEFT  Result Date: 05/22/2020 CLINICAL DATA:  Fall EXAM: DG HIP (WITH OR WITHOUT PELVIS) 2-3V LEFT COMPARISON:  None. FINDINGS: There is no evidence of hip fracture or dislocation. There is no evidence of arthropathy or other focal bone abnormality. IMPRESSION: Negative. Electronically Signed   By: Ulyses Jarred M.D.   On: 05/22/2020 00:19   VAS [place] LOWER EXTREMITY VENOUS (DVT)  Result Date: 05/22/2020  Lower Venous DVTStudy Indications: Swelling, Erythema, and Pain.  Limitations: Technically difficult study due to patient body habitus, patient pain tolerance, and poor  ultrasound/tissue interface due to skin changes. Comparison Study: No prior studies. Performing Technologist: Darlin Coco  Examination Guidelines: A complete evaluation includes B-mode imaging, spectral Doppler, color Doppler, and power Doppler as needed of all accessible portions of each vessel. Bilateral testing is considered an integral part of a complete examination. Limited examinations for reoccurring indications may be performed as noted. The reflux portion of the exam is performed with the patient in reverse Trendelenburg.  +---------+---------------+---------+-----------+----------+--------------+ RIGHT    CompressibilityPhasicitySpontaneityPropertiesThrombus Aging +---------+---------------+---------+-----------+----------+--------------+ CFV                     Yes      Yes                                 +---------+---------------+---------+-----------+----------+--------------+ SFJ                     Yes      Yes                                 +---------+---------------+---------+-----------+----------+--------------+ FV Prox  Full           Yes      Yes                                 +---------+---------------+---------+-----------+----------+--------------+ FV Mid                                                Not visualized +---------+---------------+---------+-----------+----------+--------------+ FV Distal  Not visualized +---------+---------------+---------+-----------+----------+--------------+ PFV      Full                                                        +---------+---------------+---------+-----------+----------+--------------+ POP                                                   Not visualized +---------+---------------+---------+-----------+----------+--------------+ PTV                                                   Not visualized  +---------+---------------+---------+-----------+----------+--------------+ PERO                                                  Not visualized +---------+---------------+---------+-----------+----------+--------------+   +---------+---------------+---------+-----------+----------+--------------+ LEFT     CompressibilityPhasicitySpontaneityPropertiesThrombus Aging +---------+---------------+---------+-----------+----------+--------------+ CFV                     Yes      Yes                                 +---------+---------------+---------+-----------+----------+--------------+ SFJ                     Yes      Yes                                 +---------+---------------+---------+-----------+----------+--------------+ FV Prox  Full           Yes      Yes                                 +---------+---------------+---------+-----------+----------+--------------+ FV Mid   Full           Yes      Yes                                 +---------+---------------+---------+-----------+----------+--------------+ FV Distal                                             Not visualized +---------+---------------+---------+-----------+----------+--------------+ PFV                     Yes      Yes                                 +---------+---------------+---------+-----------+----------+--------------+ POP  Yes      Yes                                 +---------+---------------+---------+-----------+----------+--------------+ PTV                                                   Not visualized +---------+---------------+---------+-----------+----------+--------------+ PERO                                                  Not visualized +---------+---------------+---------+-----------+----------+--------------+     Summary: RIGHT: - There is no evidence of deep vein thrombosis in the visualized portions of the lower extremity.  However, portions of this examination were limited- see technologist comments above.  LEFT: - There is no evidence of deep vein thrombosis in the visualized portions of the lower extremity. However, portions of this examination were limited- see technologist comments above.  *See table(s) above for measurements and observations.    Preliminary     Additional 35 minutes with more than 50% spent in reviewing records, counseling patient/family and coordinating care.   Maida Widger T. [place]  If 7PM-7AM, please contact night-coverage [email or web address] 05/22/2020, 2:10 PM

## 2020-05-23 ENCOUNTER — Encounter (HOSPITAL_COMMUNITY): Payer: Self-pay | Admitting: Internal Medicine

## 2020-05-23 DIAGNOSIS — Z66 Do not resuscitate: Secondary | ICD-10-CM

## 2020-05-23 LAB — RENAL FUNCTION PANEL
Albumin: 2.6 g/dL — ABNORMAL LOW (ref 3.5–5.0)
Anion gap: 9 (ref 5–15)
BUN: 20 mg/dL (ref 8–23)
CO2: 22 mmol/L (ref 22–32)
Calcium: 8.5 mg/dL — ABNORMAL LOW (ref 8.9–10.3)
Chloride: 107 mmol/L (ref 98–111)
Creatinine, Ser: 1.06 mg/dL — ABNORMAL HIGH (ref 0.44–1.00)
GFR calc Af Amer: 58 mL/min — ABNORMAL LOW (ref >60)
GFR calc non Af Amer: 50 mL/min — ABNORMAL LOW (ref >60)
Glucose, Bld: 117 mg/dL — ABNORMAL HIGH (ref 70–99)
Phosphorus: 3 mg/dL (ref 2.5–4.6)
Potassium: 3.9 mmol/L (ref 3.5–5.1)
Sodium: 138 mmol/L (ref 135–145)

## 2020-05-23 LAB — GLUCOSE, CAPILLARY
Glucose-Capillary: 105 mg/dL — ABNORMAL HIGH (ref 70–99)
Glucose-Capillary: 153 mg/dL — ABNORMAL HIGH (ref 70–99)
Glucose-Capillary: 185 mg/dL — ABNORMAL HIGH (ref 70–99)
Glucose-Capillary: 176 mg/dL — ABNORMAL HIGH (ref 70–99)

## 2020-05-23 LAB — MAGNESIUM: Magnesium: 1.8 mg/dL (ref 1.7–2.4)

## 2020-05-23 LAB — PROCALCITONIN: Procalcitonin: <0.1 ng/mL

## 2020-05-23 MED ORDER — DOXYCYCLINE HYCLATE 100 MG PO TABS
100.0000 mg | ORAL_TABLET | Freq: Two times a day (BID) | ORAL | Status: DC
  Administered 2020-05-23 – 2020-05-26 (×7): 100 mg via ORAL
  Filled 2020-05-23 (×7): qty 1

## 2020-05-23 NOTE — Plan of Care (Signed)
  Problem: Clinical Measurements: Goal: Diagnostic test results will improve Outcome: Progressing   Problem: Clinical Measurements: Goal: Respiratory complications will improve Outcome: Progressing   Problem: Nutrition: Goal: Adequate nutrition will be maintained Outcome: Progressing   Problem: Elimination: Goal: Will not experience complications related to bowel motility Outcome: Progressing Goal: Will not experience complications related to urinary retention Outcome: Progressing   Problem: Pain Managment: Goal: General experience of comfort will improve Outcome: Progressing

## 2020-05-23 NOTE — Progress Notes (Signed)
Occupational Therapy Evaluation  Patient with functional deficits listed below impacting safety and independence with self care. Unsure of patient baseline mentation however confused today unable to self correct year when given multiple attempts, loses her train of thought when providing information and has increased difficulty trying to explain situation leading up to hospitalization as she states to therapist has not been out of bed since Wednesday (patient admitted Friday) and cannot fully explain how her toileting needs were met/who was checking in on her. Patient required increased time and minG assist for bed mobility with heavy use of bed rails and bed height elevated, patient having difficulty problem solving buttons on on to adjust. Patient min A with max cues for safety + sequencing to stand EOB and mod A to complete stand pivot to recliner with patient frequently letting go of walker despite cues not to. Currently recommend rehab prior to discharge to maximize patient safety and independence with ADLs, functional transfers, will continue to follow acutely.    05/23/20 0803  OT Visit Information  Last OT Received On 05/23/20  Assistance Needed +2 (safety)  History of Present Illness Patient is a 79 year old female with history of IDDM-2, CKD-3, HTN, HLD, CVA, DVT not on anticoagulation, debility, chronic venous insufficiency and morbid obesity presenting with generalized weakness and left hip pain after recent accidental fall when she missed her recliner when trying to sit down.  Precautions  Precautions Fall  Restrictions  Weight Bearing Restrictions No  Home Living  Family/patient expects to be discharged to: Private residence  Living Arrangements Alone  Available Help at Discharge Family;Friend(s)  Type of [place] One level  Bathroom Shower/Tub Walk-in shower  [place] height  Bathroom Accessibility No  [place] bars - tub/shower;Walker - 4 wheels;Shower seat  Prior Function  Level of Independence Needs assistance  Gait / Transfers Assistance Needed ambulates with rollator  ADL's / Homemaking Assistance Needed patient waits to have supervision for showering, reports sister puts clean socks on at night for the next day, assists her with medication management/monitoring blood sugar and helps her pick out her outfits for the next day  Communication  Communication No difficulties  Pain Assessment  Pain Assessment No/denies pain  Cognition  Arousal/Alertness Awake/alert  Behavior During Therapy [facility] for tasks assessed/performed  Overall Cognitive Status No family/caregiver present to determine baseline cognitive functioning  General Comments tangential, patient stating year is 1921 repeatedly "I just had my high school reunion so I was confused" patient having difficulty keeping info straight when explaining she hasn't been out of bed since Wednesday. explain to patient she was admitted Friday, patient endorses not getting OOB for 2 days at home. When asked what she did for the bathroom patient states "I have those pads" when asked who changed her linens at home patient states well I was here.    Upper Extremity Assessment  Upper Extremity Assessment Generalized weakness  Lower Extremity Assessment  Lower Extremity Assessment Defer to PT evaluation  Cervical / Trunk Assessment  Cervical / Trunk Assessment Other exceptions  Cervical / Trunk Exceptions body habitus  ADL  Overall ADL's  Needs assistance/impaired  Eating/Feeding Set up;Sitting  Grooming Set up;Sitting  Upper Body Bathing Minimal assistance;Sitting  Lower Body Bathing Maximal assistance;Sitting/lateral leans;Sit to/from stand  Upper Body Dressing  Minimal assistance;Sitting  Lower Body Dressing Total assistance;Sitting/lateral leans  Lower Body Dressing Details (indicate cue type and reason) to don socks,  patient reports  sister helps at home with this, when asked if she needs help with underwear patient states "I don't think so"  Toilet Transfer Moderate assistance;Stand-pivot;Cueing for safety;Cueing for sequencing;BSC;RW  Toilet Transfer Details (indicate cue type and reason) to recliner, patient letting go of walker prematurely, max cues for sequencing + safety  Toileting- Clothing Manipulation and Hygiene Sitting/lateral lean;Sit to/from stand;Maximal assistance  Functional mobility during ADLs Moderate assistance;Cueing for safety;Cueing for sequencing;Rolling walker  General ADL Comments patient requiring increased assistance with self care due to decreased safety awareness, activity tolerance, balance, strength  Bed Mobility  Overal bed mobility Needs Assistance  Bed Mobility Supine to Sit  Supine to sit Min guard;HOB elevated  General bed mobility comments patient require significant increased time to scoot out to EOB with bed height significantly elevated and heavy reliance on bed rails. Patient states she normally gets out of a flat bed. Patient also having difficulty processing that bed height and leg portion of bed as low as it will go continues to keep hitting buttons on side of bed despite being told won't go any lower  Transfers  Overall transfer level Needs assistance  Equipment used Rolling walker (2 wheeled)  Transfers Sit to/from [employer] Transfers  Sit to Stand Min assist  Stand pivot transfers Mod assist  General transfer comment max cues to use momentum to power up and for hand placement not to pull on walker, patient min A to stand and mod A for safety to complete stand pivot to chair as patient lets go of walker to grab onto recliner before aligned with chair, decreased stabilization  Balance  Overall balance assessment Needs assistance  Sitting-balance support Bilateral upper extremity supported;Feet supported  Sitting balance-Leahy Scale Poor  Standing balance support  Bilateral upper extremity supported  Standing balance-Leahy Scale Poor  Standing balance comment up to mod A for safety with walker  General Comments  General comments (skin integrity, edema, etc.) HR up to 136 with transfer to recliner  OT - End of Session  Equipment Utilized During Treatment Rolling walker  Activity Tolerance Patient tolerated treatment well  Patient left in chair;with call bell/phone within reach;with chair alarm set  Nurse Communication Mobility status  OT Assessment  OT Recommendation/Assessment Patient needs continued OT Services  OT Visit Diagnosis Unsteadiness on feet (R26.81);Other abnormalities of gait and mobility (R26.89);History of falling (Z91.81);Muscle weakness (generalized) (M62.81)  OT Problem List Decreased strength;Decreased activity tolerance;Impaired balance (sitting and/or standing);Decreased safety awareness;Obesity;Decreased cognition  Barriers to Discharge Comments unsure if patient has 24/7 assistance available  OT Plan  OT Frequency (ACUTE ONLY) Min 2X/week  OT Treatment/Interventions (ACUTE ONLY) Self-care/ADL training;Therapeutic exercise;DME and/or AE instruction;Therapeutic activities;Cognitive remediation/compensation;Patient/family education;Balance training  AM-PAC OT "6 Clicks" Daily Activity Outcome Measure (Version 2)  Help from another person eating meals? 3  Help from another person taking care of personal grooming? 3  Help from another person toileting, which includes using toliet, bedpan, or urinal? 2  Help from another person bathing (including washing, rinsing, drying)? 2  Help from another person to put on and taking off regular upper body clothing? 3  Help from another person to put on and taking off regular lower body clothing? 1  6 Click Score 14  OT Recommendation  Follow Up Recommendations SNF;Supervision/Assistance - 24 hour  OT Equipment Other (comment) (defer to next venue)  Individuals Consulted  Consulted and Agree  with Results and Recommendations Patient  Acute Rehab OT Goals  Patient Stated Goal agreeable to up  to chair  OT Goal Formulation With patient  Time For Goal Achievement 06/06/20  Potential to Achieve Goals Good  OT Time Calculation  OT Start Time (ACUTE ONLY) 0718  OT Stop Time (ACUTE ONLY) 0757  OT Time Calculation (min) 39 min  OT General Charges  $OT Visit 1 Visit  OT Evaluation  $OT Eval Low Complexity 1 Low  OT Treatments  $Self Care/Home Management  23-37 mins  Written Expression  Dominant Hand Right   Delbert Phenix OT OT pager: [phone]

## 2020-05-23 NOTE — Evaluation (Signed)
Physical Therapy Evaluation Patient Details Name: Julie Mora MRN: [id number] DOB: 09/09/1941 Today's Date: 05/23/2020   History of Present Illness  Patient is a 79 year old female with history of IDDM-2, CKD-3, HTN, HLD, CVA, DVT not on anticoagulation, debility, chronic venous insufficiency and morbid obesity presenting with generalized weakness and left hip pain after recent accidental fall when she missed her recliner when trying to sit down.  Clinical Impression  Pt admitted with above diagnosis.  Pt agreeable to OOB to chair with ~ min assist, HR up to 129 with transfer.  pleasant and cooperative. States her hip pain is better. Household ambulator at baseline, reports sleeping in recliner "more than I should".   Will continue to follow in acute setting. Recommend HHPT  Pt currently with functional limitations due to the deficits listed below (see PT Problem List). Pt will benefit from skilled PT to increase their independence and safety with mobility to allow discharge to the venue listed below.       Follow Up Recommendations Home health PT;Supervision - Intermittent    Equipment Recommendations  None recommended by PT    Recommendations for Other Services       Precautions / Restrictions Precautions Precautions: Fall Restrictions Weight Bearing Restrictions: No      Mobility  Bed Mobility Overal bed mobility: Needs Assistance Bed Mobility: Supine to Sit     Supine to sit: Min guard;Min assist     General bed mobility comments: incr time and effort, completes with min (light assist with LLE) to min/guard for safety. pt reports she pulls on her rollator to get OOB at home  Transfers Overall transfer level: Needs assistance Equipment used: Rolling walker (2 wheeled) Transfers: Sit to/from [employer] Sit to Stand: Min assist Stand pivot transfers: Min assist;Min guard       General transfer comment: cues for hand placement, uses momentum to  come to full stand  Ambulation/Gait                Stairs            Wheelchair Mobility    Modified Rankin (Stroke Patients Only)       Balance   Sitting-balance support: No upper extremity supported;Feet supported Sitting balance-Leahy Scale: Fair       Standing balance-Leahy Scale: Poor Standing balance comment: reliant on UE support                             Pertinent Vitals/Pain Pain Assessment: No/denies pain    Home Living Family/patient expects to be discharged to:: Private residence Living Arrangements: Alone Available Help at Discharge: Family;Friend(s) Type of Home: House Home Access: Ramped entrance     Home Layout: One level Home Equipment: Grab bars - tub/shower;Walker - 4 wheels;Shower seat      Prior Function Level of Independence: Needs assistance   Gait / Transfers Assistance Needed: ambulates with rollator  ADL's / Homemaking Assistance Needed: patient waits to have supervision for showering, reports sister puts clean socks on at night for the next day, assists her with medication management/monitoring blood sugar and helps her pick out her outfits for the next day  Comments: sister comes over in PM to take her blood sugar and organizes her medications, pays her bills,  and usually assist with evening meal     Hand Dominance        Extremity/Trunk Assessment   Upper Extremity Assessment Upper Extremity  Assessment: Defer to OT evaluation;Generalized weakness    Lower Extremity Assessment Lower Extremity Assessment: Generalized weakness (skin thickened, vascular changes apparent, small bleeding wounds bil LEs)       Communication   Communication: No difficulties  Cognition Arousal/Alertness: Awake/alert Behavior During Therapy: WFL for tasks assessed/performed Overall Cognitive Status: No family/caregiver present to determine baseline cognitive functioning                                  General Comments: pleasant and cooperative for PT eval, follows commands and oriented x 3 (confused and tangential for OT eval early am)      General Comments      Exercises     Assessment/Plan    PT Assessment Patient needs continued PT services  PT Problem List Decreased strength;Decreased mobility;Decreased skin integrity;Decreased activity tolerance;Obesity       PT Treatment Interventions      PT Goals (Current goals can be found in the Care Plan section)  Acute Rehab PT Goals Patient Stated Goal: agreeable to up to chair PT Goal Formulation: With patient Time For Goal Achievement: 06/06/20 Potential to Achieve Goals: Good    Frequency Min 3X/week   Barriers to discharge        Co-evaluation               AM-PAC PT "6 Clicks" Mobility  Outcome Measure Help needed turning from your back to your side while in a flat bed without using bedrails?: A Little Help needed moving from lying on your back to sitting on the side of a flat bed without using bedrails?: A Little Help needed moving to and from a bed to a chair (including a wheelchair)?: A Little Help needed standing up from a chair using your arms (e.g., wheelchair or bedside chair)?: A Little Help needed to walk in hospital room?: A Lot Help needed climbing 3-5 steps with a railing? : A Lot 6 Click Score: 16    End of Session Equipment Utilized During Treatment: Gait belt Activity Tolerance: Patient tolerated treatment well Patient left: in chair;with call bell/phone within reach;with chair alarm set Nurse Communication: Mobility status PT Visit Diagnosis: Difficulty in walking, not elsewhere classified (R26.2)    Time: 1345-1400 PT Time Calculation (min) (ACUTE ONLY): 15 min   Charges:   PT Evaluation $PT Eval Low Complexity: [place], PT  Acute Rehab Dept ([place]) [phone] Pager [phone]  05/23/2020   [facility] 05/23/2020, 2:06 PM

## 2020-05-23 NOTE — Progress Notes (Signed)
PROGRESS NOTE  Julie Mora JXB:[id number] DOB: [birth year]   PCP: Elby Showers, MD  Patient is from: Home.  Lives alone.  Uses Rollator at baseline.  DOA: 05/21/2020 LOS: 1  Brief Narrative / Interim history: 79 year old female with history of IDDM-2, CKD-3, HTN, HLD, CVA, DVT not on anticoagulation, debility, chronic venous insufficiency and morbid obesity presenting with generalized weakness, poor p.o. intake and left hip pain after her recent accidental fall when she missed her recliner as she tried to sit down.  She did not strike her head or loss consciousness after fall.  She is vaccinated against COVID-19.  In ED, febrile to 101.3.  HR in 120s.  Slightly tachypneic.  Not hypoxic or hypotensive.  WBC 8.2.  CMP without significant finding.  COVID-19 PCR negative.  UA with large leukocytes and rare bacteria.  Lactic acid 1.3.  Pro-Cal less than 0.10.  VBG normal.  Slightly elevated inflammatory markers including D-dimer and CRP. CXR with patchy heterogeneous opacities in the periphery of the left lung and more mildly in the right lung bases.  Hip and pelvis x-ray without acute finding.  RVP, blood cultures and LLE [place] ordered.  Patient was admitted for sepsis due to atypical pneumonia, UTI and bilateral lower extremity cellulitis.  She was a started on ceftriaxone and azithromycin.   RVP and bilateral lower extremity venous Doppler negative.  Blood cultures NGTD.  CTA chest negative for PE or pneumonia but chronic coronary artery calcification.  Urine culture pending.  Subjective: Seen and examined earlier this morning.  No major events overnight of this morning.  No complaints.  She denies chest pain, dyspnea, GI or UTI symptoms.  No pain in her legs.  She ambulated with PT this morning.  Objective: Vitals:   05/22/20 2040 05/23/20 0552 05/23/20 1008 05/23/20 1054  BP: 109/64 130/70 121/74 121/76  Pulse: (!) 104 94 (!) 124 (!) 102  Resp: '18 18  20  ' Temp: 99.1 F (37.3 C) 99.8 F  (37.7 C)  98.1 F (36.7 C)  TempSrc: Oral Oral  Oral  SpO2: (!) 86% 95%  95%  Weight:      Height:        Intake/Output Summary (Last 24 hours) at 05/23/2020 1222 Last data filed at 05/23/2020 0552 Gross per 24 hour  Intake --  Output 250 ml  Net -250 ml   Filed Weights   05/22/20 1227  Weight: 115.7 kg    Examination:  GENERAL: No apparent distress.  Nontoxic.  Sitting on bedside chair. HEENT: MMM.  Vision and hearing grossly intact.  NECK: Supple.  No apparent JVD.  RESP: 95% on RA.  No IWOB.  Fair aeration bilaterally. CVS:  RRR. Heart sounds normal.  ABD/GI/GU: BS+. Abd soft, NTND.  MSK/EXT:  Moves extremities.  Chronic venous insufficiency with BLE skin induration and tree bark skin  SKIN: Chronic venous insufficiency with tree bark skin, ulceration and pus in BLE.  Some increased warmth to touch, mainly on the left NEURO: Awake, alert and oriented appropriately.  No apparent focal neuro deficit. PSYCH: Calm. Normal affect.   Procedures:  None  Microbiology summarized: COVID-19 PCR negative. RVP negative. Blood cultures NGTD. Urine culture pending.  Assessment & Plan: Sepsis secondary to BLE purulent cellulitis likely due to chronic venous insufficiency/lymphedema:  RVP, procalcitonin and CTA chest negative.  Blood cultures NGTD.  No UTI symptoms.  Her UA is not convincing for UTI.  Pneumonia and UTI ruled out.  She met sepsis criteria on admission  with fever, tachycardia, tachypnea and lower extremity purulent cellulitis.   Sepsis physiology resolved. -IV CTX 9/4.  Switch to p.o. doxycycline 9/5> given purulence.  No history of MRSA.  MRSA PCR negative. -Obtain superficial wound cultures -Discontinued azithromycin on 9/4. -PT/OT eval -She may benefit from [employer].  Intermittently wears pneumatic compression socks at home -[facility] consulted.   Uncontrolled IDDM-2 with hyperglycemia: A1c 10.3%. Recent Labs  Lab 05/22/20 1205 05/22/20 1654 05/22/20 2037  05/23/20 0829 05/23/20 1132  GLUCAP 148* 129* 136* 105* 153*  -Continue SSI-moderate -Resume home statin after med rec.  CKD-3A: Stable. -Continue to monitor  Generalized weakness/recent fall/debility-uses Rollator at home.  Lives alone. -PT/OT evaluation,  -fall precautions   Hyperlipidemia: Stable. -Resume statin after pharmacy med rec is done  Pneumonia-ruled out.  Pyuria: Denies UTI symptoms.  UTI ruled out.  Elevated D-dimer: PE and DVT ruled out.  COVID-19 PCR negative.  Sinus tachycardia: Could be due to sepsis.  Resolved. -Continue low-dose metoprolol  Morbid obesity Body mass index is 46.64 kg/m.        DVT prophylaxis:  enoxaparin (LOVENOX) injection 40 mg Start: 05/22/20 1000  Code Status: DNR/DNI Family Communication: Patient and/or RN. Available if any question.  Status is: Inpatient  Remains inpatient appropriate because:Unsafe d/c plan and Inpatient level of care appropriate due to severity of illness   Dispo: The patient is from: Home              Anticipated d/c is to: SNF              Anticipated d/c date is: 2 days              Patient currently is not medically stable to d/c.       Consultants:  None   Sch Meds:  Scheduled Meds: . atorvastatin  20 mg Oral Daily  . doxycycline  100 mg Oral Q12H  . enoxaparin (LOVENOX) injection  40 mg Subcutaneous Q24H  . gabapentin  100 mg Oral QHS  . insulin aspart  0-15 Units Subcutaneous TID WC  . insulin aspart  0-5 Units Subcutaneous QHS  . metoprolol tartrate  12.5 mg Oral BID   Continuous Infusions:  PRN Meds:.acetaminophen **OR** acetaminophen  Antimicrobials: Anti-infectives (From admission, onward)   Start     Dose/Rate Route Frequency Ordered Stop   05/23/20 1000  doxycycline (VIBRA-TABS) tablet 100 mg        100 mg Oral Every 12 hours 05/23/20 0815     05/23/20 0200  azithromycin (ZITHROMAX) 500 mg in sodium chloride 0.9 % 250 mL IVPB  Status:  Discontinued        500  mg 250 mL/hr over 60 Minutes Intravenous Every 24 hours 05/22/20 0553 05/22/20 1410   05/23/20 0200  cefTRIAXone (ROCEPHIN) 1 g in sodium chloride 0.9 % 100 mL IVPB  Status:  Discontinued        1 g 200 mL/hr over 30 Minutes Intravenous Every 24 hours 05/22/20 0553 05/23/20 0815   05/22/20 0115  cefTRIAXone (ROCEPHIN) 1 g in sodium chloride 0.9 % 100 mL IVPB        1 g 200 mL/hr over 30 Minutes Intravenous  Once 05/22/20 0102 05/22/20 0151   05/22/20 0115  azithromycin (ZITHROMAX) 500 mg in sodium chloride 0.9 % 250 mL IVPB        500 mg 250 mL/hr over 60 Minutes Intravenous  Once 05/22/20 0102 05/22/20 0309       I have personally  reviewed the following labs and images: CBC: Recent Labs  Lab 05/21/20 2245  WBC 8.2  NEUTROABS 6.1  HGB 13.6  HCT 42.5  MCV 99.5  PLT 218   BMP &GFR Recent Labs  Lab 05/21/20 2245 05/23/20 0618  NA 138 138  K 4.7 3.9  CL 105 107  CO2 24 22  GLUCOSE 185* 117*  BUN 27* 20  CREATININE 1.25* 1.06*  CALCIUM 8.8* 8.5*  MG  --  1.8  PHOS  --  3.0   Estimated Creatinine Clearance: 52.7 mL/min (A) (by C-G formula based on SCr of 1.06 mg/dL (H)). Liver & Pancreas: Recent Labs  Lab 05/21/20 2245 05/23/20 0618  AST 27  --   ALT 15  --   ALKPHOS 72  --   BILITOT 0.3  --   PROT 7.0  --   ALBUMIN 2.9* 2.6*   No results for input(s): LIPASE, AMYLASE in the last 168 hours. No results for input(s): AMMONIA in the last 168 hours. Diabetic: Recent Labs    05/21/20 2245  HGBA1C 10.3*   Recent Labs  Lab 05/22/20 1205 05/22/20 1654 05/22/20 2037 05/23/20 0829 05/23/20 1132  GLUCAP 148* 129* 136* 105* 153*   Cardiac Enzymes: No results for input(s): CKTOTAL, CKMB, CKMBINDEX, TROPONINI in the last 168 hours. No results for input(s): PROBNP in the last 8760 hours. Coagulation Profile: No results for input(s): INR, PROTIME in the last 168 hours. Thyroid Function Tests: No results for input(s): TSH, T4TOTAL, FREET4, T3FREE, THYROIDAB in  the last 72 hours. Lipid Profile: No results for input(s): CHOL, HDL, LDLCALC, TRIG, CHOLHDL, LDLDIRECT in the last 72 hours. Anemia Panel: Recent Labs    05/21/20 2245  FERRITIN 203   Urine analysis:    Component Value Date/Time   COLORURINE YELLOW 05/21/2020 2206   APPEARANCEUR CLOUDY (A) 05/21/2020 2206   LABSPEC 1.010 05/21/2020 2206   PHURINE 6.0 05/21/2020 2206   GLUCOSEU NEGATIVE 05/21/2020 2206   HGBUR SMALL (A) 05/21/2020 2206   BILIRUBINUR NEGATIVE 05/21/2020 2206   BILIRUBINUR NEG 08/21/2019 1134   KETONESUR NEGATIVE 05/21/2020 2206   PROTEINUR 30 (A) 05/21/2020 2206   UROBILINOGEN 0.2 08/21/2019 1134   UROBILINOGEN 0.2 12/11/2010 1434   NITRITE NEGATIVE 05/21/2020 2206   LEUKOCYTESUR LARGE (A) 05/21/2020 2206   Sepsis Labs: Invalid input(s): PROCALCITONIN, [place]  Microbiology: Recent Results (from the past 240 hour(s))  Culture, Urine     Status: None (Preliminary result)   Collection Time: 05/21/20 10:06 PM   Specimen: Urine, Random  Result Value Ref Range Status   Specimen Description   Final    URINE, RANDOM Performed at [facility] [address], [place], [place] [address]    Special Requests   Final    NONE Performed at [facility], [place] [address], [place], [place] [address]    Culture   Final    CULTURE REINCUBATED FOR BETTER GROWTH Performed at [facility], [place] [address] [place],  [address]    Report Status PENDING  Incomplete  SARS Coronavirus 2 by RT PCR (hospital order, performed in [facility] hospital lab) Nasopharyngeal Nasopharyngeal Swab     Status: None   Collection Time: 05/21/20 10:45 PM   Specimen: Nasopharyngeal Swab  Result Value Ref Range Status   SARS Coronavirus 2 NEGATIVE NEGATIVE Final    Comment: (NOTE) SARS-CoV-2 target nucleic acids are NOT DETECTED.  The SARS-CoV-2 RNA is generally detectable in upper and lower respiratory specimens during  the acute phase  of infection. The lowest concentration of SARS-CoV-2 viral copies this assay can detect is 250 copies / mL. A negative result does not preclude SARS-CoV-2 infection and should not be used as the sole basis for treatment or other patient management decisions.  A negative result may occur with improper specimen collection / handling, submission of specimen other than nasopharyngeal swab, presence of viral mutation(s) within the areas targeted by this assay, and inadequate number of viral copies (<250 copies / mL). A negative result must be combined with clinical observations, patient history, and epidemiological information.  Fact Sheet for Patients:   StrictlyIdeas.no  Fact Sheet for Healthcare Providers: BankingDealers.co.za  This test is not yet approved or  cleared by the [place] FDA and has been authorized for detection and/or diagnosis of SARS-CoV-2 by FDA under an Emergency Use Authorization (EUA).  This EUA will remain in effect (meaning this test can be used) for the duration of the COVID-19 declaration under Section 564(b)(1) of the Act, 21 U.S.C. section 360bbb-3(b)(1), unless the authorization is terminated or revoked sooner.  Performed at [facility], [place] [address] [place], [place] [address]   Culture, blood (routine x 2)     Status: None (Preliminary result)   Collection Time: 05/21/20 10:45 PM   Specimen: BLOOD LEFT FOREARM  Result Value Ref Range Status   Specimen Description   Final    BLOOD LEFT FOREARM Performed at [facility] [address], [place], [place] [address]    Special Requests   Final    BOTTLES DRAWN AEROBIC AND ANAEROBIC Blood Culture results may not be optimal due to an inadequate volume of blood received in culture bottles Performed at [facility] [address], [place], [place] [address]    Culture   Final    NO GROWTH 1  DAY Performed at [facility], [place] [address], [place], [place] [address]    Report Status PENDING  Incomplete  Culture, blood (routine x 2)     Status: None (Preliminary result)   Collection Time: 05/21/20 10:45 PM   Specimen: BLOOD RIGHT ARM  Result Value Ref Range Status   Specimen Description   Final    BLOOD RIGHT ARM Performed at [facility], [place] [address] [place], [place] [address]    Special Requests   Final    BOTTLES DRAWN AEROBIC AND ANAEROBIC Blood Culture adequate volume Performed at [facility] [address] [place], [place] [address]    Culture   Final    NO GROWTH 1 DAY Performed at [facility], [place] [address] [place], [place] [address]    Report Status PENDING  Incomplete  Respiratory Panel by PCR     Status: None   Collection Time: 05/22/20  2:40 AM   Specimen: Nasopharyngeal Swab; Respiratory  Result Value Ref Range Status   Adenovirus NOT DETECTED NOT DETECTED Final   Coronavirus 229E NOT DETECTED NOT DETECTED Final    Comment: (NOTE) The Coronavirus on the Respiratory Panel, DOES NOT test for the novel  Coronavirus (2019 nCoV)    Coronavirus HKU1 NOT DETECTED NOT DETECTED Final   Coronavirus NL63 NOT DETECTED NOT DETECTED Final   Coronavirus OC43 NOT DETECTED NOT DETECTED Final   Metapneumovirus NOT DETECTED NOT DETECTED Final   Rhinovirus / Enterovirus NOT DETECTED NOT DETECTED Final   Influenza A NOT DETECTED NOT DETECTED Final   Influenza B NOT DETECTED  NOT DETECTED Final   Parainfluenza Virus 1 NOT DETECTED NOT DETECTED Final   Parainfluenza Virus 2 NOT DETECTED NOT DETECTED Final   Parainfluenza Virus 3 NOT DETECTED NOT DETECTED Final   Parainfluenza Virus 4 NOT DETECTED NOT DETECTED Final   Respiratory Syncytial Virus NOT DETECTED NOT DETECTED Final   Bordetella pertussis NOT DETECTED NOT DETECTED Final   Chlamydophila pneumoniae NOT DETECTED NOT DETECTED Final   Mycoplasma pneumoniae NOT DETECTED  NOT DETECTED Final    Comment: Performed at [facility], [place] [address] [place], [place] [address]    Radiology Studies: No results found.   [address] [address] [place]  If 7PM-7AM, please contact night-coverage [email or web address] 05/23/2020, 12:22 PM

## 2020-05-24 LAB — URINE CULTURE: Culture: >=100000 — AB

## 2020-05-24 LAB — GLUCOSE, CAPILLARY
Glucose-Capillary: 186 mg/dL — ABNORMAL HIGH (ref 70–99)
Glucose-Capillary: 177 mg/dL — ABNORMAL HIGH (ref 70–99)
Glucose-Capillary: 200 mg/dL — ABNORMAL HIGH (ref 70–99)
Glucose-Capillary: 170 mg/dL — ABNORMAL HIGH (ref 70–99)

## 2020-05-24 MED ORDER — COLLAGENASE 250 UNIT/GM EX OINT
TOPICAL_OINTMENT | Freq: Every day | CUTANEOUS | Status: DC
  Filled 2020-05-24: qty 30

## 2020-05-24 NOTE — Progress Notes (Signed)
PROGRESS NOTE  Julie Mora TMH:[id number] DOB: 1941-05-23   PCP: Elby Showers, MD  Patient is from: Home.  Lives alone.  Uses Rollator at baseline.  DOA: 05/21/2020 LOS: 2  Brief Narrative / Interim history: 79 year old female with history of IDDM-2, CKD-3, HTN, HLD, CVA, DVT not on anticoagulation, debility, chronic venous insufficiency and morbid obesity presenting with generalized weakness, poor p.o. intake and left hip pain after her recent accidental fall when she missed her recliner as she tried to sit down.  She did not strike her head or loss consciousness after fall.  She is vaccinated against COVID-19.  In ED, febrile to 101.3.  HR in 120s.  Slightly tachypneic.  Not hypoxic or hypotensive.  WBC 8.2.  CMP without significant finding.  COVID-19 PCR negative.  UA with large leukocytes and rare bacteria.  Lactic acid 1.3.  Pro-Cal less than 0.10.  VBG normal.  Slightly elevated inflammatory markers including D-dimer and CRP. CXR with patchy heterogeneous opacities in the periphery of the left lung and more mildly in the right lung bases.  Hip and pelvis x-ray without acute finding.  RVP, blood cultures and LLE [place] ordered.  Patient was admitted for sepsis due to atypical pneumonia, UTI and bilateral lower extremity cellulitis.  She was a started on ceftriaxone and azithromycin.   RVP and bilateral lower extremity venous Doppler negative.  Blood cultures NGTD.  CTA chest negative for PE or pneumonia but chronic coronary artery calcification.  Urine culture with Enterococcus faecalis but patient without UTI symptoms.  Subjective: Seen and examined earlier this morning.  No major events overnight of this morning.  Denies chest pain, dyspnea, GI or UTI symptoms.  She prefer going home with home health versus SNF.  She lives alone.  Objective: Vitals:   05/23/20 1326 05/23/20 2127 05/24/20 0553 05/24/20 0954  BP: 106/65 116/69 109/69 (!) 102/53  Pulse: 87 97 (!) 110 98  Resp: (!) 21 (!)  21 20   Temp: 98.7 F (37.1 C) 98.9 F (37.2 C) 99.7 F (37.6 C)   TempSrc: Oral Oral Oral   SpO2: 92% 100% 91%   Weight:      Height:        Intake/Output Summary (Last 24 hours) at 05/24/2020 1214 Last data filed at 05/24/2020 0553 Gross per 24 hour  Intake 120 ml  Output 950 ml  Net -830 ml   Filed Weights   05/22/20 1227  Weight: 115.7 kg    Examination:  GENERAL: No apparent distress.  Nontoxic. HEENT: MMM.  Vision and hearing grossly intact.  NECK: Supple.  No apparent JVD.  RESP: On room air.  No IWOB.  Fair aeration bilaterally. CVS:  RRR. Heart sounds normal.  ABD/GI/GU: BS+. Abd soft, NTND.  MSK/EXT: Chronic venous insufficiency with tree bark skin SKIN: Tree bark skin with erythema in BLE.  Some increased warmth to touch. NEURO: Awake, alert and oriented appropriately.  No apparent focal neuro deficit. PSYCH: Calm. Normal affect.   Procedures:  None  Microbiology summarized: COVID-19 PCR negative. RVP negative. Blood cultures NGTD. Urine culture pending.  Assessment & Plan: Sepsis secondary to BLE purulent cellulitis likely due to chronic venous insufficiency/lymphedema:  RVP, procalcitonin and CTA chest negative.  Blood cultures NGTD. Pneumonia ruled out.  Urine culture with Enterococcus faecalis but no UTI symptoms.  She met sepsis criteria on admission with fever, tachycardia, tachypnea and lower extremity purulent cellulitis.   Sepsis physiology resolved. -IV CTX 9/4.  Switch to p.o. doxycycline 9/5>  given purulence.  No history of MRSA.  MRSA PCR negative. -Follow wound cultures.  Gram stain with few gram-positive cocci's in pairs. -Discontinued azithromycin on 9/4. -PT/OT eval -Appreciate input by [facility].  Ambulatory referral to vein specialist ordered as recommended   Uncontrolled IDDM-2 with hyperglycemia: A1c 10.3%. Recent Labs  Lab 05/23/20 0829 05/23/20 1132 05/23/20 1558 05/23/20 2125 05/24/20 0755  GLUCAP 105* 153* 185* 176* 186*   -Continue SSI-moderate -Resume home statin after med rec.  CKD-3A: Stable. -Continue to monitor  Generalized weakness/recent fall/debility-uses Rollator at home.  Lives alone. -Home health PT/OT on discharge. -fall precautions   Hyperlipidemia: Stable. -Resume statin after pharmacy med rec is done  Pneumonia-ruled out.  Pyuria/bacteriuria: Urine culture with Enterococcus faecalis.  She continues to deny UTI symptoms.  Elevated D-dimer: PE and DVT ruled out.  COVID-19 PCR negative.  Sinus tachycardia: Could be due to sepsis.  Resolved. -Continue low-dose metoprolol  Morbid obesity Body mass index is 46.64 kg/m.        DVT prophylaxis:  enoxaparin (LOVENOX) injection 40 mg Start: 05/22/20 1000  Code Status: DNR/DNI Family Communication: Patient and/or RN. Available if any question.  Status is: Inpatient  Remains inpatient appropriate because:Unsafe d/c plan and Inpatient level of care appropriate due to severity of illness.  Continue current regimen pending superficial wound culture result.    Dispo: The patient is from: Home              Anticipated d/c is to: Home              Anticipated d/c date is: 1 day after wound culture results              Patient currently is not medically stable to d/c.       Consultants:  None   Sch Meds:  Scheduled Meds: . atorvastatin  20 mg Oral Daily  . collagenase   Topical Daily  . doxycycline  100 mg Oral Q12H  . enoxaparin (LOVENOX) injection  40 mg Subcutaneous Q24H  . gabapentin  100 mg Oral QHS  . insulin aspart  0-15 Units Subcutaneous TID WC  . insulin aspart  0-5 Units Subcutaneous QHS  . metoprolol tartrate  12.5 mg Oral BID   Continuous Infusions:  PRN Meds:.acetaminophen **OR** acetaminophen  Antimicrobials: Anti-infectives (From admission, onward)   Start     Dose/Rate Route Frequency Ordered Stop   05/23/20 1000  doxycycline (VIBRA-TABS) tablet 100 mg        100 mg Oral Every 12 hours 05/23/20  0815     05/23/20 0200  azithromycin (ZITHROMAX) 500 mg in sodium chloride 0.9 % 250 mL IVPB  Status:  Discontinued        500 mg 250 mL/hr over 60 Minutes Intravenous Every 24 hours 05/22/20 0553 05/22/20 1410   05/23/20 0200  cefTRIAXone (ROCEPHIN) 1 g in sodium chloride 0.9 % 100 mL IVPB  Status:  Discontinued        1 g 200 mL/hr over 30 Minutes Intravenous Every 24 hours 05/22/20 0553 05/23/20 0815   05/22/20 0115  cefTRIAXone (ROCEPHIN) 1 g in sodium chloride 0.9 % 100 mL IVPB        1 g 200 mL/hr over 30 Minutes Intravenous  Once 05/22/20 0102 05/22/20 0151   05/22/20 0115  azithromycin (ZITHROMAX) 500 mg in sodium chloride 0.9 % 250 mL IVPB        500 mg 250 mL/hr over 60 Minutes Intravenous  Once 05/22/20 0102 05/22/20  0309       I have personally reviewed the following labs and images: CBC: Recent Labs  Lab 05/21/20 2245  WBC 8.2  NEUTROABS 6.1  HGB 13.6  HCT 42.5  MCV 99.5  PLT 218   BMP &GFR Recent Labs  Lab 05/21/20 2245 05/23/20 0618  NA 138 138  K 4.7 3.9  CL 105 107  CO2 24 22  GLUCOSE 185* 117*  BUN 27* 20  CREATININE 1.25* 1.06*  CALCIUM 8.8* 8.5*  MG  --  1.8  PHOS  --  3.0   Estimated Creatinine Clearance: 52.7 mL/min (A) (by C-G formula based on SCr of 1.06 mg/dL (H)). Liver & Pancreas: Recent Labs  Lab 05/21/20 2245 05/23/20 0618  AST 27  --   ALT 15  --   ALKPHOS 72  --   BILITOT 0.3  --   PROT 7.0  --   ALBUMIN 2.9* 2.6*   No results for input(s): LIPASE, AMYLASE in the last 168 hours. No results for input(s): AMMONIA in the last 168 hours. Diabetic: Recent Labs    05/21/20 2245  HGBA1C 10.3*   Recent Labs  Lab 05/23/20 0829 05/23/20 1132 05/23/20 1558 05/23/20 2125 05/24/20 0755  GLUCAP 105* 153* 185* 176* 186*   Cardiac Enzymes: No results for input(s): CKTOTAL, CKMB, CKMBINDEX, TROPONINI in the last 168 hours. No results for input(s): PROBNP in the last 8760 hours. Coagulation Profile: No results for input(s):  INR, PROTIME in the last 168 hours. Thyroid Function Tests: No results for input(s): TSH, T4TOTAL, FREET4, T3FREE, THYROIDAB in the last 72 hours. Lipid Profile: No results for input(s): CHOL, HDL, LDLCALC, TRIG, CHOLHDL, LDLDIRECT in the last 72 hours. Anemia Panel: Recent Labs    05/21/20 2245  FERRITIN 203   Urine analysis:    Component Value Date/Time   COLORURINE YELLOW 05/21/2020 2206   APPEARANCEUR CLOUDY (A) 05/21/2020 2206   LABSPEC 1.010 05/21/2020 2206   PHURINE 6.0 05/21/2020 2206   GLUCOSEU NEGATIVE 05/21/2020 2206   HGBUR SMALL (A) 05/21/2020 2206   BILIRUBINUR NEGATIVE 05/21/2020 2206   BILIRUBINUR NEG 08/21/2019 1134   KETONESUR NEGATIVE 05/21/2020 2206   PROTEINUR 30 (A) 05/21/2020 2206   UROBILINOGEN 0.2 08/21/2019 1134   UROBILINOGEN 0.2 12/11/2010 1434   NITRITE NEGATIVE 05/21/2020 2206   LEUKOCYTESUR LARGE (A) 05/21/2020 2206   Sepsis Labs: Invalid input(s): PROCALCITONIN, [place]  Microbiology: Recent Results (from the past 240 hour(s))  Culture, Urine     Status: Abnormal   Collection Time: 05/21/20 10:06 PM   Specimen: Urine, Random  Result Value Ref Range Status   Specimen Description   Final    URINE, RANDOM Performed at [facility] [address], [place], [place] [address]    Special Requests   Final    NONE Performed at [facility], [place] [address], [place], [place] [address]    Culture >=100,000 COLONIES/mL ENTEROCOCCUS FAECALIS (A)  Final   Report Status 05/24/2020 FINAL  Final   Organism ID, Bacteria ENTEROCOCCUS FAECALIS (A)  Final      Susceptibility   Enterococcus faecalis - MIC*    AMPICILLIN <=2 SENSITIVE Sensitive     NITROFURANTOIN <=16 SENSITIVE Sensitive     VANCOMYCIN 1 SENSITIVE Sensitive     * >=100,000 COLONIES/mL ENTEROCOCCUS FAECALIS  SARS Coronavirus 2 by RT PCR (hospital order, performed in [place] hospital lab) Nasopharyngeal Nasopharyngeal Swab     Status: None    Collection Time: 05/21/20 10:45  PM   Specimen: Nasopharyngeal Swab  Result Value Ref Range Status   SARS Coronavirus 2 NEGATIVE NEGATIVE Final    Comment: (NOTE) SARS-CoV-2 target nucleic acids are NOT DETECTED.  The SARS-CoV-2 RNA is generally detectable in upper and lower respiratory specimens during the acute phase of infection. The lowest concentration of SARS-CoV-2 viral copies this assay can detect is 250 copies / mL. A negative result does not preclude SARS-CoV-2 infection and should not be used as the sole basis for treatment or other patient management decisions.  A negative result may occur with improper specimen collection / handling, submission of specimen other than nasopharyngeal swab, presence of viral mutation(s) within the areas targeted by this assay, and inadequate number of viral copies (<250 copies / mL). A negative result must be combined with clinical observations, patient history, and epidemiological information.  Fact Sheet for Patients:   StrictlyIdeas.no  Fact Sheet for Healthcare Providers: BankingDealers.co.za  This test is not yet approved or  cleared by the [place] FDA and has been authorized for detection and/or diagnosis of SARS-CoV-2 by FDA under an Emergency Use Authorization (EUA).  This EUA will remain in effect (meaning this test can be used) for the duration of the COVID-19 declaration under Section 564(b)(1) of the Act, 21 U.S.C. section 360bbb-3(b)(1), unless the authorization is terminated or revoked sooner.  Performed at [facility], [place] [address], [place], [place] [address]   Culture, blood (routine x 2)     Status: None (Preliminary result)   Collection Time: 05/21/20 10:45 PM   Specimen: BLOOD LEFT FOREARM  Result Value Ref Range Status   Specimen Description   Final    BLOOD LEFT FOREARM Performed at [facility] [address]  [place], [place] [address]    Special Requests   Final    BOTTLES DRAWN AEROBIC AND ANAEROBIC Blood Culture results may not be optimal due to an inadequate volume of blood received in culture bottles Performed at [facility] [address], [place], [place] [address]    Culture   Final    NO GROWTH 2 DAYS Performed at [place] [address], [place], [place] [address]    Report Status PENDING  Incomplete  Culture, blood (routine x 2)     Status: None (Preliminary result)   Collection Time: 05/21/20 10:45 PM   Specimen: BLOOD RIGHT ARM  Result Value Ref Range Status   Specimen Description   Final    BLOOD RIGHT ARM Performed at [facility], [place] [address] [place], [place] [address]    Special Requests   Final    BOTTLES DRAWN AEROBIC AND ANAEROBIC Blood Culture adequate volume Performed at [facility] [address], [place], [place] [address]    Culture   Final    NO GROWTH 2 DAYS Performed at [facility] [address] [place], [place] [address]    Report Status PENDING  Incomplete  Respiratory Panel by PCR     Status: None   Collection Time: 05/22/20  2:40 AM   Specimen: Nasopharyngeal Swab; Respiratory  Result Value Ref Range Status   Adenovirus NOT DETECTED NOT DETECTED Final   Coronavirus 229E NOT DETECTED NOT DETECTED Final    Comment: (NOTE) The Coronavirus on the Respiratory Panel, DOES NOT test for the novel  Coronavirus (2019 nCoV)    Coronavirus HKU1 NOT DETECTED NOT DETECTED Final   Coronavirus NL63 NOT DETECTED  NOT DETECTED Final   Coronavirus OC43 NOT DETECTED NOT DETECTED Final   Metapneumovirus NOT DETECTED NOT DETECTED Final   Rhinovirus / Enterovirus NOT DETECTED NOT DETECTED Final   Influenza A NOT DETECTED NOT DETECTED Final   Influenza B NOT DETECTED NOT DETECTED Final   Parainfluenza Virus 1 NOT DETECTED NOT DETECTED Final   Parainfluenza Virus 2 NOT DETECTED NOT DETECTED Final    Parainfluenza Virus 3 NOT DETECTED NOT DETECTED Final   Parainfluenza Virus 4 NOT DETECTED NOT DETECTED Final   Respiratory Syncytial Virus NOT DETECTED NOT DETECTED Final   Bordetella pertussis NOT DETECTED NOT DETECTED Final   Chlamydophila pneumoniae NOT DETECTED NOT DETECTED Final   Mycoplasma pneumoniae NOT DETECTED NOT DETECTED Final    Comment: Performed at [facility], [place] [address], [place], [place] [address]  Aerobic/Anaerobic Culture (surgical/deep wound)     Status: None (Preliminary result)   Collection Time: 05/23/20  7:04 PM   Specimen: Leg  Result Value Ref Range Status   Specimen Description   Final    LEG Performed at [facility] [address] [place], [place] [address]    Special Requests   Final    LEFT Performed at [facility], [place] [address], [place], [place] [address]    Gram Stain   Final    RARE WBC PRESENT, PREDOMINANTLY PMN RARE GRAM POSITIVE COCCI IN PAIRS IN CLUSTERS    Culture   Final    CULTURE REINCUBATED FOR BETTER GROWTH Performed at [facility], [place] [address] [place] [address], [place], [place] [address]    Report Status PENDING  Incomplete  Aerobic/Anaerobic Culture (surgical/deep wound)     Status: None (Preliminary result)   Collection Time: 05/23/20  7:04 PM   Specimen: Leg  Result Value Ref Range Status   Specimen Description   Final    LEG Performed at [facility] [address] [place] [address], [place], [place] [address]    Special Requests   Final    RIGHT Performed at [facility], [place] [address], [place], [place] [address]    Gram Stain   Final    RARE WBC PRESENT, PREDOMINANTLY PMN FEW GRAM POSITIVE COCCI IN PAIRS IN CLUSTERS    Culture   Final    CULTURE REINCUBATED FOR BETTER GROWTH Performed at [facility], [place] [address] [place], [place] [address]    Report Status PENDING  Incomplete    Radiology Studies: No results found.   Warner [address] T.  [place]  If 7PM-7AM, please contact night-coverage [email or web address] 05/24/2020, 12:14 PM

## 2020-05-24 NOTE — [place] Notes (Signed)
   05/24/20 1258  Assess: MEWS Score  Temp 100 F (37.8 C)  BP (!) 122/58  Pulse Rate 85  Resp (!) 28  SpO2 96 %  Assess: MEWS Score  MEWS Temp 0  MEWS Systolic 0  MEWS Pulse 0  MEWS RR 2  MEWS LOC 0  MEWS Score 2  MEWS Score Color Yellow  Assess: if the MEWS score is Yellow or Red  Were vital signs taken at a resting state? Yes  Focused Assessment No change from prior assessment  Early Detection of Sepsis Score *See Row Information* Medium  MEWS guidelines implemented *See Row Information* Yes  Treat  MEWS Interventions Other (Comment) (Cont. to assess patient)  Pain Scale 0-10  Pain Score 0  Take Vital Signs  Increase Vital Sign Frequency  Yellow: Q 2hr X 2 then Q 4hr X 2, if remains yellow, continue Q 4hrs  Escalate  MEWS: Escalate Yellow: discuss with charge nurse/RN and consider discussing with provider and RRT  Notify: Charge Nurse/RN  Name of Charge Nurse/RN Notified Olean Ree  Date Charge Nurse/RN Notified 05/24/20  Time Charge Nurse/RN Notified [id number]   Patient alert and oriented in no distress, elevated resp-28. Will continue to assess patient.

## 2020-05-24 NOTE — Consult Note (Signed)
[place] Nurse Consult Note: Reason for Consult: LE cellulitis Patient with history of lymphedema. Treated at [facility] for same. She has lymphedema pumps at home.  Was being treated with manual massage and compression however she has not purchased compression wraps. She uses her lymphedema pumps three times a week.  Wound type: full thickness ulceration right pretibial area in the presence of lymphedema Pressure Injury POA: NA Measurement: 1.0cm x 0.3cm x 0.3cm; 0.2cm x 0.2cm x 0.1cm  Wound KNL:ZJQBHA Drainage (amount, consistency, odor) yellow Periwound: intact with hyperkeratotic skin changes related to chronic lymphedema  Dressing procedure/placement/frequency: Add enzymatic debridement to the right LE wound; top with dry dressing. Change daily.  Discussed with patient the need to be followed/treated in a lymphadema clinic. She has limited resources and transportation is an issue.  She resides in [place] so the [facility] [place], [place] [address] [phone]. Would be the most convenient for her would need a referral at the time of DC  Discussed POC with patient and bedside nurse.  Re consult if needed, will not follow at this time. Thanks  Barnaby Rippeon [employer], RN,CWOCN, CNS, CWON-AP [phone])   Lymphedema  Resources (updated July 2019 ) Each site requires a referral from your primary care MD [facility] [address] [place], [place]  [phone] (Upper extremities)  [place], [place] [phone] (Lower extremities)  [place] [address] [place], [place] [address] [phone] [facility] [place], [place] [address] [phone] [place], [address] Medical Office Building [place], [place] [phone]  [facility] [place], [place] [address] [phone]  [facility]  (only treatment for lymphedema related to cancer diagnosis) [place], [place] [address] [phone]    [facility] [address] [place], [place] [address] [phone]  [facility] [address] [place], [place] [address] [phone] [facility] [address] [place], [place] [address] [phone]

## 2020-05-25 LAB — RENAL FUNCTION PANEL
Albumin: 2.3 g/dL — ABNORMAL LOW (ref 3.5–5.0)
Anion gap: 7 (ref 5–15)
BUN: 19 mg/dL (ref 8–23)
CO2: 23 mmol/L (ref 22–32)
Calcium: 8.1 mg/dL — ABNORMAL LOW (ref 8.9–10.3)
Chloride: 109 mmol/L (ref 98–111)
Creatinine, Ser: 1 mg/dL (ref 0.44–1.00)
GFR calc Af Amer: >60 mL/min (ref >60)
GFR calc non Af Amer: 54 mL/min — ABNORMAL LOW (ref >60)
Glucose, Bld: 192 mg/dL — ABNORMAL HIGH (ref 70–99)
Phosphorus: 3 mg/dL (ref 2.5–4.6)
Potassium: 3.7 mmol/L (ref 3.5–5.1)
Sodium: 139 mmol/L (ref 135–145)

## 2020-05-25 LAB — GLUCOSE, CAPILLARY
Glucose-Capillary: 198 mg/dL — ABNORMAL HIGH (ref 70–99)
Glucose-Capillary: 249 mg/dL — ABNORMAL HIGH (ref 70–99)
Glucose-Capillary: 187 mg/dL — ABNORMAL HIGH (ref 70–99)
Glucose-Capillary: 206 mg/dL — ABNORMAL HIGH (ref 70–99)

## 2020-05-25 LAB — MAGNESIUM: Magnesium: 1.7 mg/dL (ref 1.7–2.4)

## 2020-05-25 MED ORDER — INSULIN DETEMIR 100 UNIT/ML ~~LOC~~ SOLN
5.0000 [IU] | Freq: Two times a day (BID) | SUBCUTANEOUS | Status: DC
  Administered 2020-05-25 – 2020-05-26 (×3): 5 [IU] via SUBCUTANEOUS
  Filled 2020-05-25 (×4): qty 0.05

## 2020-05-25 MED ORDER — LOPERAMIDE HCL 2 MG PO CAPS: 2.0000 mg | ORAL_CAPSULE | ORAL | Status: DC | PRN

## 2020-05-25 NOTE — Progress Notes (Signed)
Physical Therapy Treatment Patient Details Name: Julie Mora MRN: [id number] DOB: 03-03-1941 Today's Date: 05/25/2020    History of Present Illness Patient is a 79 year old female with history of IDDM-2, CKD-3, HTN, HLD, CVA, DVT not on anticoagulation, debility, chronic venous insufficiency and morbid obesity presenting with generalized weakness and left hip pain after recent accidental fall when she missed her recliner when trying to sit down.    PT Comments    Progressing with mobility. Pt continues to present with general weakness, decreased activity tolerance, and impaired gait and balance. Pt is unsteady and at risk for falls when ambulating. Dyspnea 2/4-O2 95% on RA. Discussed d/c plan-pt is open to ST rehab at SNF to improve functional mobility and regain PLOF.    Follow Up Recommendations  SNF     Equipment Recommendations  None recommended by PT    Recommendations for Other Services       Precautions / Restrictions Precautions Precautions: Fall Restrictions Weight Bearing Restrictions: No    Mobility  Bed Mobility Overal bed mobility: Needs Assistance Bed Mobility: Supine to Sit     Supine to sit: [facility] elevated;Min assist     General bed mobility comments: Increased time and effort. Assist to scoot fully to EOB.  Transfers Overall transfer level: Needs assistance Equipment used: Rolling walker (2 wheeled) Transfers: Sit to/from Stand Sit to Stand: Min assist         General transfer comment: VCs safety, hand placement. Assist to rise, stabilize, control descent.  Ambulation/Gait Ambulation/Gait assistance: Min assist Gait Distance (Feet): 38 Feet Assistive device: Rolling walker (2 wheeled) Gait Pattern/deviations: Step-through pattern;Decreased stride length;Trunk flexed     General Gait Details: Assist to stabilize pt throughout distance. Unsteady. Followed with recliner for safety. Dyspnea 2/4   Stairs             Wheelchair Mobility     Modified Rankin (Stroke Patients Only)       Balance Overall balance assessment: Needs assistance         Standing balance support: Bilateral upper extremity supported Standing balance-Leahy Scale: Poor                              Cognition Arousal/Alertness: Awake/alert Behavior During Therapy: WFL for tasks assessed/performed Overall Cognitive Status: Within Functional Limits for tasks assessed                                        Exercises      General Comments        Pertinent Vitals/Pain Pain Assessment: No/denies pain    Home Living                      Prior Function            PT Goals (current goals can now be found in the care plan section) Progress towards PT goals: Progressing toward goals    Frequency    Min 3X/week      PT Plan Discharge plan needs to be updated    Co-evaluation              AM-PAC PT "6 Clicks" Mobility   Outcome Measure  Help needed turning from your back to your side while in a flat bed without using bedrails?: A Little Help needed moving from  lying on your back to sitting on the side of a flat bed without using bedrails?: A Little Help needed moving to and from a bed to a chair (including a wheelchair)?: A Little Help needed standing up from a chair using your arms (e.g., wheelchair or bedside chair)?: A Little Help needed to walk in hospital room?: A Little Help needed climbing 3-5 steps with a railing? : A Lot 6 Click Score: 17    End of Session Equipment Utilized During Treatment: Gait belt Activity Tolerance: Patient tolerated treatment well Patient left: in chair;with call bell/phone within reach;with chair alarm set   PT Visit Diagnosis: Difficulty in walking, not elsewhere classified (R26.2);Muscle weakness (generalized) (M62.81)     Time: 0630-1601 PT Time Calculation (min) (ACUTE ONLY): 18 min  Charges:  $Gait Training: 8-22 mins                         Doreatha Massed, PT Acute Rehabilitation  Office: [phone] Pager: [phone]

## 2020-05-25 NOTE — Progress Notes (Signed)
PROGRESS NOTE  Julie Mora SEG:[id number] DOB: 05/07/1941   PCP: Elby Showers, MD  Patient is from: Home.  Lives alone.  Uses Rollator at baseline.  DOA: 05/21/2020 LOS: 3  Brief Narrative / Interim history: 79 year old female with history of IDDM-2, CKD-3, HTN, HLD, CVA, DVT not on anticoagulation, debility, chronic venous insufficiency and morbid obesity presenting with generalized weakness, poor p.o. intake and left hip pain after her recent accidental fall when she missed her recliner as she tried to sit down.  She did not strike her head or loss consciousness after fall.  She is vaccinated against COVID-19.  In ED, febrile to 101.3.  HR in 120s.  Slightly tachypneic.  Not hypoxic or hypotensive.  WBC 8.2.  CMP without significant finding.  COVID-19 PCR negative.  UA with large leukocytes and rare bacteria.  Lactic acid 1.3.  Pro-Cal less than 0.10.  VBG normal.  Slightly elevated inflammatory markers including D-dimer and CRP. CXR with patchy heterogeneous opacities in the periphery of the left lung and more mildly in the right lung bases.  Hip and pelvis x-ray without acute finding.  RVP, blood cultures and LLE [place] ordered.  Patient was admitted for sepsis due to atypical pneumonia, UTI and bilateral lower extremity cellulitis.  She was a started on ceftriaxone and azithromycin.   RVP and bilateral lower extremity venous Doppler negative.  Blood cultures NGTD.  CTA chest negative for PE or pneumonia but chronic coronary artery calcification.  Urine culture with Enterococcus faecalis but patient without UTI symptoms.  Superficial wound cultures pending.  Plan is to discharge to SNF when bed is available  Subjective: Seen and examined earlier this morning.  No major events overnight of this morning.  She is frustrated that no one responded to her request for assistance with bathroom in the last 2 hours.  She denies chest pain, dyspnea, nausea, vomiting, abdominal pain or UTI symptoms.  She  reports some loose stool.  Denies melena or hematochezia.  Objective: Vitals:   05/24/20 2042 05/24/20 2048 05/25/20 0306 05/25/20 0941  BP: 111/68  126/64 122/62  Pulse: 88  76 74  Resp: _0 Temp: 98.2 F (36.8 C)  98.2 F (36.8 C)   TempSrc: Oral  Oral   SpO2: 97%  95%   Weight:      Height:        Intake/Output Summary (Last 24 hours) at 05/25/2020 1246 Last data filed at 05/25/2020 0900 Gross per 24 hour  Intake 600 ml  Output 350 ml  Net 250 ml   Filed Weights   05/22/20 1227  Weight: 115.7 kg    Examination: GENERAL: No apparent distress.  Nontoxic. HEENT: MMM.  Vision and hearing grossly intact.  NECK: Supple.  No apparent JVD.  RESP:  No IWOB.  Fair aeration bilaterally. CVS:  RRR. Heart sounds normal.  ABD/GI/GU: BS+. Abd soft, NTND.  MSK/EXT:  Moves extremities.  Chronic venous insufficiency with tree bark skin SKIN: Tree bark skin with erythema in BLE.  Some increased warmth to touch. NEURO: Awake, alert and oriented appropriately.  No apparent focal neuro deficit. PSYCH: Calm. Normal affect.  Procedures:  None  Microbiology summarized: COVID-19 PCR negative. RVP negative. Blood cultures NGTD. Urine culture pending.  Assessment & Plan: Sepsis secondary to BLE purulent cellulitis likely due to chronic venous insufficiency/lymphedema:  RVP, procalcitonin and CTA chest negative.  Blood cultures NGTD. Pneumonia ruled out.  Urine culture with Enterococcus faecalis but no UTI symptoms.  She met sepsis criteria on admission with fever, tachycardia, tachypnea and lower extremity purulent cellulitis.   Sepsis physiology resolved. -IV CTX 9/4.  Switch to p.o. doxycycline 9/5> given purulence.  No history of MRSA.  MRSA PCR negative. -Follow wound cultures.  Gram stain with few gram-positive cocci's in pairs. -Discontinued azithromycin on 9/4. -PT/OT recommended SNF. -Ambulatory referral to vein specialist ordered as recommended by [facility]OCN RN   Uncontrolled  IDDM-2 with hyperglycemia: A1c 10.3%. Recent Labs  Lab 05/24/20 1219 05/24/20 1632 05/24/20 2136 05/25/20 0743 05/25/20 1139  GLUCAP 177* 200* 170* 198* 249*  -Continue SSI-moderate -Add Levemir 5 units twice daily -Resume home statin after med rec.  CKD-3A: Stable. -Continue to monitor  Generalized weakness/recent fall/debility-uses Rollator at home.  Lives alone. -fall precautions  -Continue PT/OT  Hyperlipidemia: Stable. -Resume statin after pharmacy med rec is done  Pneumonia-ruled out.  Pyuria/bacteriuria: Urine culture with Enterococcus faecalis.  She continues to deny UTI symptoms.  Elevated D-dimer: PE and DVT ruled out.  COVID-19 PCR negative.  Sinus tachycardia: Could be due to sepsis.  Resolved. -Continue low-dose metoprolol  Morbid obesity Body mass index is 46.64 kg/m.        DVT prophylaxis:  enoxaparin (LOVENOX) injection 40 mg Start: 05/22/20 1000  Code Status: DNR/DNI Family Communication: Patient and/or RN. Available if any question.  Status is: Inpatient  Remains inpatient appropriate because:Unsafe d/c plan.   Dispo: The patient is from: Home              Anticipated d/c is to: SNF              Anticipated d/c date is: 1 day               Patient currently is medically stable to d/c.       Consultants:  None   Sch Meds:  Scheduled Meds: . atorvastatin  20 mg Oral Daily  . collagenase   Topical Daily  . doxycycline  100 mg Oral Q12H  . enoxaparin (LOVENOX) injection  40 mg Subcutaneous Q24H  . gabapentin  100 mg Oral QHS  . insulin aspart  0-15 Units Subcutaneous TID WC  . insulin aspart  0-5 Units Subcutaneous QHS  . insulin detemir  5 Units Subcutaneous BID  . metoprolol tartrate  12.5 mg Oral BID   Continuous Infusions:  PRN Meds:.acetaminophen **OR** acetaminophen, loperamide  Antimicrobials: Anti-infectives (From admission, onward)   Start     Dose/Rate Route Frequency Ordered Stop   05/23/20 1000   doxycycline (VIBRA-TABS) tablet 100 mg        100 mg Oral Every 12 hours 05/23/20 0815     05/23/20 0200  azithromycin (ZITHROMAX) 500 mg in sodium chloride 0.9 % 250 mL IVPB  Status:  Discontinued        500 mg 250 mL/hr over 60 Minutes Intravenous Every 24 hours 05/22/20 0553 05/22/20 1410   05/23/20 0200  cefTRIAXone (ROCEPHIN) 1 g in sodium chloride 0.9 % 100 mL IVPB  Status:  Discontinued        1 g 200 mL/hr over 30 Minutes Intravenous Every 24 hours 05/22/20 0553 05/23/20 0815   05/22/20 0115  cefTRIAXone (ROCEPHIN) 1 g in sodium chloride 0.9 % 100 mL IVPB        1 g 200 mL/hr over 30 Minutes Intravenous  Once 05/22/20 0102 05/22/20 0151   05/22/20 0115  azithromycin (ZITHROMAX) 500 mg in sodium chloride 0.9 % 250 mL IVPB  500 mg 250 mL/hr over 60 Minutes Intravenous  Once 05/22/20 0102 05/22/20 0309       I have personally reviewed the following labs and images: CBC: Recent Labs  Lab 05/21/20 2245  WBC 8.2  NEUTROABS 6.1  HGB 13.6  HCT 42.5  MCV 99.5  PLT 218   BMP &GFR Recent Labs  Lab 05/21/20 2245 05/23/20 0618 05/25/20 0520  NA 138 138 139  K 4.7 3.9 3.7  CL 105 107 109  CO2 _0 GLUCOSE 185* 117* 192*  BUN 27* 20 19  CREATININE 1.25* 1.06* 1.00  CALCIUM 8.8* 8.5* 8.1*  MG  --  1.8 1.7  PHOS  --  3.0 3.0   Estimated Creatinine Clearance: 55.8 mL/min (by C-G formula based on SCr of 1 mg/dL). Liver & Pancreas: Recent Labs  Lab 05/21/20 2245 05/23/20 0618 05/25/20 0520  AST 27  --   --   ALT 15  --   --   ALKPHOS 72  --   --   BILITOT 0.3  --   --   PROT 7.0  --   --   ALBUMIN 2.9* 2.6* 2.3*   No results for input(s): LIPASE, AMYLASE in the last 168 hours. No results for input(s): AMMONIA in the last 168 hours. Diabetic: No results for input(s): HGBA1C in the last 72 hours. Recent Labs  Lab 05/24/20 1219 05/24/20 1632 05/24/20 2136 05/25/20 0743 05/25/20 1139  GLUCAP 177* 200* 170* 198* 249*   Cardiac Enzymes: No results  for input(s): CKTOTAL, CKMB, CKMBINDEX, TROPONINI in the last 168 hours. No results for input(s): PROBNP in the last 8760 hours. Coagulation Profile: No results for input(s): INR, PROTIME in the last 168 hours. Thyroid Function Tests: No results for input(s): TSH, T4TOTAL, FREET4, T3FREE, THYROIDAB in the last 72 hours. Lipid Profile: No results for input(s): CHOL, HDL, LDLCALC, TRIG, CHOLHDL, LDLDIRECT in the last 72 hours. Anemia Panel: No results for input(s): VITAMINB12, FOLATE, FERRITIN, TIBC, IRON, RETICCTPCT in the last 72 hours. Urine analysis:    Component Value Date/Time   COLORURINE YELLOW 05/21/2020 2206   APPEARANCEUR CLOUDY (A) 05/21/2020 2206   LABSPEC 1.010 05/21/2020 2206   PHURINE 6.0 05/21/2020 2206   GLUCOSEU NEGATIVE 05/21/2020 2206   HGBUR SMALL (A) 05/21/2020 2206   BILIRUBINUR NEGATIVE 05/21/2020 2206   BILIRUBINUR NEG 08/21/2019 1134   KETONESUR NEGATIVE 05/21/2020 2206   PROTEINUR 30 (A) 05/21/2020 2206   UROBILINOGEN 0.2 08/21/2019 1134   UROBILINOGEN 0.2 12/11/2010 1434   NITRITE NEGATIVE 05/21/2020 2206   LEUKOCYTESUR LARGE (A) 05/21/2020 2206   Sepsis Labs: Invalid input(s): PROCALCITONIN, [place]  Microbiology: Recent Results (from the past 240 hour(s))  Culture, Urine     Status: Abnormal   Collection Time: 05/21/20 10:06 PM   Specimen: Urine, Random  Result Value Ref Range Status   Specimen Description   Final    URINE, RANDOM Performed at [place] [address] [place], [place] [address]    Special Requests   Final    NONE Performed at [facility], [place] [address], [place], [place] [address]    Culture >=100,000 COLONIES/mL ENTEROCOCCUS FAECALIS (A)  Final   Report Status 05/24/2020 FINAL  Final   Organism ID, Bacteria ENTEROCOCCUS FAECALIS (A)  Final      Susceptibility   Enterococcus faecalis - MIC*    AMPICILLIN <=2 SENSITIVE Sensitive     NITROFURANTOIN <=16 SENSITIVE Sensitive      VANCOMYCIN  1 SENSITIVE Sensitive     * >=100,000 COLONIES/mL ENTEROCOCCUS FAECALIS  SARS Coronavirus 2 by RT PCR (hospital order, performed in [facility] hospital lab) Nasopharyngeal Nasopharyngeal Swab     Status: None   Collection Time: 05/21/20 10:45 PM   Specimen: Nasopharyngeal Swab  Result Value Ref Range Status   SARS Coronavirus 2 NEGATIVE NEGATIVE Final    Comment: (NOTE) SARS-CoV-2 target nucleic acids are NOT DETECTED.  The SARS-CoV-2 RNA is generally detectable in upper and lower respiratory specimens during the acute phase of infection. The lowest concentration of SARS-CoV-2 viral copies this assay can detect is 250 copies / mL. A negative result does not preclude SARS-CoV-2 infection and should not be used as the sole basis for treatment or other patient management decisions.  A negative result may occur with improper specimen collection / handling, submission of specimen other than nasopharyngeal swab, presence of viral mutation(s) within the areas targeted by this assay, and inadequate number of viral copies (<250 copies / mL). A negative result must be combined with clinical observations, patient history, and epidemiological information.  Fact Sheet for Patients:   StrictlyIdeas.no  Fact Sheet for Healthcare Providers: BankingDealers.co.za  This test is not yet approved or  cleared by the [place] FDA and has been authorized for detection and/or diagnosis of SARS-CoV-2 by FDA under an Emergency Use Authorization (EUA).  This EUA will remain in effect (meaning this test can be used) for the duration of the COVID-19 declaration under Section 564(b)(1) of the Act, 21 U.S.C. section 360bbb-3(b)(1), unless the authorization is terminated or revoked sooner.  Performed at [facility], [place] [address], [place], [place] [address]   Culture, blood (routine x 2)     Status: None (Preliminary  result)   Collection Time: 05/21/20 10:45 PM   Specimen: BLOOD LEFT FOREARM  Result Value Ref Range Status   Specimen Description   Final    BLOOD LEFT FOREARM Performed at [place] [address], [place], [place] [address]    Special Requests   Final    BOTTLES DRAWN AEROBIC AND ANAEROBIC Blood Culture results may not be optimal due to an inadequate volume of blood received in culture bottles Performed at [place] [address] [place], [place] [address]    Culture   Final    NO GROWTH 3 DAYS Performed at [facility], [place] [address], [place], [place] [address]    Report Status PENDING  Incomplete  Culture, blood (routine x 2)     Status: None (Preliminary result)   Collection Time: 05/21/20 10:45 PM   Specimen: BLOOD RIGHT ARM  Result Value Ref Range Status   Specimen Description   Final    BLOOD RIGHT ARM Performed at [facility], [place] [address] [place], [place] [address]    Special Requests   Final    BOTTLES DRAWN AEROBIC AND ANAEROBIC Blood Culture adequate volume Performed at [place] [address], [place], [place] [address]    Culture   Final    NO GROWTH 3 DAYS Performed at [facility], [place] [address], [place], [place] [address]    Report Status PENDING  Incomplete  Respiratory Panel by PCR     Status: None   Collection Time: 05/22/20  2:40 AM   Specimen: Nasopharyngeal Swab; Respiratory  Result Value Ref Range Status   Adenovirus NOT DETECTED NOT DETECTED Final   Coronavirus 229E  NOT DETECTED NOT DETECTED Final    Comment: (NOTE) The Coronavirus on the Respiratory Panel, DOES NOT test for the novel  Coronavirus (2019 nCoV)    Coronavirus HKU1 NOT DETECTED NOT DETECTED Final   Coronavirus NL63 NOT DETECTED NOT DETECTED Final   Coronavirus OC43 NOT DETECTED NOT DETECTED Final   Metapneumovirus NOT DETECTED NOT DETECTED Final   Rhinovirus / Enterovirus NOT DETECTED  NOT DETECTED Final   Influenza A NOT DETECTED NOT DETECTED Final   Influenza B NOT DETECTED NOT DETECTED Final   Parainfluenza Virus 1 NOT DETECTED NOT DETECTED Final   Parainfluenza Virus 2 NOT DETECTED NOT DETECTED Final   Parainfluenza Virus 3 NOT DETECTED NOT DETECTED Final   Parainfluenza Virus 4 NOT DETECTED NOT DETECTED Final   Respiratory Syncytial Virus NOT DETECTED NOT DETECTED Final   Bordetella pertussis NOT DETECTED NOT DETECTED Final   Chlamydophila pneumoniae NOT DETECTED NOT DETECTED Final   Mycoplasma pneumoniae NOT DETECTED NOT DETECTED Final    Comment: Performed at [facility], [place] [address], [place], [place] [address]  Aerobic/Anaerobic Culture (surgical/deep wound)     Status: None (Preliminary result)   Collection Time: 05/23/20  7:04 PM   Specimen: Leg  Result Value Ref Range Status   Specimen Description   Final    LEG Performed at [place] [address], [place], [place] [address]    Special Requests   Final    LEFT Performed at [facility], [place] [address], [place], [place] [address]    Gram Stain   Final    RARE WBC PRESENT, PREDOMINANTLY PMN RARE GRAM POSITIVE COCCI IN PAIRS IN CLUSTERS    Culture   Final    CULTURE REINCUBATED FOR BETTER GROWTH Performed at [facility], [place] [address] [place], [place] [address]    Report Status PENDING  Incomplete  Aerobic/Anaerobic Culture (surgical/deep wound)     Status: None (Preliminary result)   Collection Time: 05/23/20  7:04 PM   Specimen: Leg  Result Value Ref Range Status   Specimen Description   Final    LEG Performed at [place] [address], [place], [place] [address]    Special Requests   Final    RIGHT Performed at [facility], [place] [address] [place], [place] [address]    Gram Stain   Final    RARE WBC PRESENT, PREDOMINANTLY PMN FEW GRAM POSITIVE COCCI IN PAIRS IN CLUSTERS    Culture    Final    CULTURE REINCUBATED FOR BETTER GROWTH Performed at [facility], [place] [address], [place], [place] [address]    Report Status PENDING  Incomplete    Radiology Studies: No results found.    T. [place]  If 7PM-7AM, please contact night-coverage [email or web address] 05/25/2020, 12:46 PM

## 2020-05-25 NOTE — TOC Progression Note (Signed)
Transition of Care [facility]) - Progression Note    Patient Details  Name: Lexany Belknap MRN: [id number] Date of Birth: [birth year]  [facility]) CM/SW Contact  Callan Yontz, Juliann Pulse, RN Phone Number: 05/25/2020, 4:54 PM  Clinical Narrative: PT recc SNF-patient agreed to SNF-faxed out await bed offers.           Expected Discharge Plan and Services                                                 Social Determinants of Health (SDOH) Interventions    Readmission Risk Interventions No flowsheet data found.

## 2020-05-25 NOTE — NC FL2 (Signed)
[place] LEVEL OF CARE SCREENING TOOL     IDENTIFICATION  Patient Name: Julie Mora Birthdate: 03-Sep-1941 Sex: female Admission Date (Current Location): 05/21/2020  [facility] [place] Number:      Facility and Address:  [facility],  [place] [address], [place]      Provider Number: [id number]  Attending Physician Name and Address:  Mercy Riding, MD  Relative Name and Phone Number:  [name] [phone]    Current Level of Care:   Recommended Level of Care: [place] Prior Approval Number:    Date Approved/Denied:   PASRR Number: [id number]  Discharge Plan: SNF    Current Diagnoses: Patient Active Problem List   Diagnosis Date Noted  . Pneumonia 05/22/2020  . Cellulitis 05/22/2020  . Fall at home, initial encounter 05/22/2020  . Paronychia of right middle finger 10/23/2018  . Primary osteoarthritis of both first carpometacarpal joints 10/23/2018  . Spasms of the hands or feet 10/23/2018  . Pancreatic cyst 06/07/2017  . UTI (urinary tract infection) 06/07/2017  . Morbid obesity ([place]) 03/02/2017  . Poorly controlled diabetes mellitus ([place]) 03/02/2017  . Stasis dermatitis of both legs 03/02/2017  . Sepsis ([place]) 01/19/2017  . Lymphedema of both lower extremities 06/21/2015  . Venous insufficiency (chronic) (peripheral) 06/21/2015  . Osteoporosis 12/15/2014  . PCO (posterior capsular opacification) 10/15/2013  . Keratitis 08/20/2013  . Corneal scarring 03/03/2013  . Metabolic syndrome 19/50/9326  . History of stroke 05/03/2012  . Ectropion 10/10/2011  . 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 Pseudophakia 10/10/2011  . History of deep venous thrombosis 04/19/2011  . Vitamin D deficiency 04/19/2011  . History of iron deficiency 04/19/2011  . Dependent edema 04/19/2011  . Mixed stress and urge urinary incontinence 04/19/2011  . CEREBROVASCULAR ACCIDENT, ACUTE 06/25/2009  .  PEMPHIGUS FOLIACEOUS 06/25/2009  . Diabetes mellitus ([place]) 05/28/2009  . HYPERCHOLESTEROLEMIA 05/28/2009  . Essential hypertension 05/28/2009    Orientation RESPIRATION BLADDER Height & Weight     Self, Time, Place, Situation  Normal Continent Weight: 115.7 kg Height:  5\' 2"  (157.5 cm)  BEHAVIORAL SYMPTOMS/MOOD NEUROLOGICAL BOWEL NUTRITION STATUS      Continent Diet (Diabetic)  AMBULATORY STATUS COMMUNICATION OF NEEDS Skin   Limited Assist Verbally Skin abrasions, Other (Comment) (BLE cellulitis, lymphedema)                       Personal Care Assistance Level of Assistance  Bathing, Feeding, Dressing Bathing Assistance: Limited assistance Feeding assistance: Limited assistance Dressing Assistance: Limited assistance     Functional Limitations Info  Hearing, Sight, Speech Sight Info: Impaired (eyeglasses) Hearing Info: Adequate Speech Info: Impaired    SPECIAL CARE FACTORS FREQUENCY  PT (By licensed PT), OT (By licensed OT)     PT Frequency: 5x week OT Frequency: 5x week            Contractures Contractures Info: Not present    Additional Factors Info  Code Status, Allergies, Insulin Sliding Scale (wt-[id number]) Code Status Info: Full code (Full code) Allergies Info: Azathioprine, Cellcept Mycophenolate Mofetil, Ramipril   Insulin Sliding Scale Info: SSI       Current Medications (05/25/2020):  This is the current hospital active medication list Current Facility-Administered Medications  Medication Dose Route Frequency Provider Last Rate Last Admin  . acetaminophen (TYLENOL) tablet 650 mg  650 mg Oral Q6H PRN Shela Leff, MD   650 mg at 05/24/20 2026   Or  .  acetaminophen (TYLENOL) suppository 650 mg  650 mg Rectal Q6H PRN Shela Leff, MD      . atorvastatin (LIPITOR) tablet 20 mg  20 mg Oral Daily Wendee Beavers T, MD   20 mg at 05/25/20 0941  . collagenase (SANTYL) ointment   Topical Daily Mercy Riding, MD   Given at 05/25/20 [phone]  .  doxycycline (VIBRA-TABS) tablet 100 mg  100 mg Oral Q12H Wendee Beavers T, MD   100 mg at 05/25/20 0941  . enoxaparin (LOVENOX) injection 40 mg  40 mg Subcutaneous Q24H Shela Leff, MD   40 mg at 05/25/20 0943  . gabapentin (NEURONTIN) capsule 100 mg  100 mg Oral QHS Wendee Beavers T, MD   100 mg at 05/24/20 2145  . insulin aspart (novoLOG) injection 0-15 Units  0-15 Units Subcutaneous TID WC Mercy Riding, MD   5 Units at 05/25/20 1153  . insulin aspart (novoLOG) injection 0-5 Units  0-5 Units Subcutaneous QHS Gonfa, Taye T, MD      . insulin detemir (LEVEMIR) injection 5 Units  5 Units Subcutaneous BID Mercy Riding, MD   5 Units at 05/25/20 1155  . loperamide (IMODIUM) capsule 2 mg  2 mg Oral PRN Wendee Beavers T, MD      . metoprolol tartrate (LOPRESSOR) tablet 12.5 mg  12.5 mg Oral BID Wendee Beavers T, MD   12.5 mg at 05/25/20 0941     Discharge Medications: Please see discharge summary for a list of discharge medications.  Relevant Imaging Results:  Relevant Lab Results:   Additional Information SS#[id number], Juliann Pulse, [place]

## 2020-05-26 DIAGNOSIS — E119 Type 2 diabetes mellitus without complications: Secondary | ICD-10-CM | POA: Diagnosis not present

## 2020-05-26 DIAGNOSIS — I959 Hypotension, unspecified: Secondary | ICD-10-CM | POA: Diagnosis not present

## 2020-05-26 DIAGNOSIS — Z66 Do not resuscitate: Secondary | ICD-10-CM | POA: Diagnosis not present

## 2020-05-26 DIAGNOSIS — E1122 Type 2 diabetes mellitus with diabetic chronic kidney disease: Secondary | ICD-10-CM | POA: Diagnosis not present

## 2020-05-26 DIAGNOSIS — N183 Chronic kidney disease, stage 3 unspecified: Secondary | ICD-10-CM | POA: Diagnosis not present

## 2020-05-26 DIAGNOSIS — I639 Cerebral infarction, unspecified: Secondary | ICD-10-CM | POA: Diagnosis not present

## 2020-05-26 DIAGNOSIS — E1165 Type 2 diabetes mellitus with hyperglycemia: Secondary | ICD-10-CM | POA: Diagnosis not present

## 2020-05-26 DIAGNOSIS — M6259 Muscle wasting and atrophy, not elsewhere classified, multiple sites: Secondary | ICD-10-CM | POA: Diagnosis not present

## 2020-05-26 DIAGNOSIS — I872 Venous insufficiency (chronic) (peripheral): Secondary | ICD-10-CM | POA: Diagnosis not present

## 2020-05-26 DIAGNOSIS — R5381 Other malaise: Secondary | ICD-10-CM | POA: Diagnosis not present

## 2020-05-26 DIAGNOSIS — Z20828 Contact with and (suspected) exposure to other viral communicable diseases: Secondary | ICD-10-CM | POA: Diagnosis not present

## 2020-05-26 DIAGNOSIS — E785 Hyperlipidemia, unspecified: Secondary | ICD-10-CM | POA: Diagnosis not present

## 2020-05-26 DIAGNOSIS — R8271 Bacteriuria: Secondary | ICD-10-CM

## 2020-05-26 DIAGNOSIS — I10 Essential (primary) hypertension: Secondary | ICD-10-CM | POA: Diagnosis not present

## 2020-05-26 DIAGNOSIS — Z86718 Personal history of other venous thrombosis and embolism: Secondary | ICD-10-CM | POA: Diagnosis not present

## 2020-05-26 DIAGNOSIS — M2550 Pain in unspecified joint: Secondary | ICD-10-CM | POA: Diagnosis not present

## 2020-05-26 DIAGNOSIS — A419 Sepsis, unspecified organism: Secondary | ICD-10-CM | POA: Diagnosis not present

## 2020-05-26 DIAGNOSIS — W19XXXA Unspecified fall, initial encounter: Secondary | ICD-10-CM | POA: Diagnosis not present

## 2020-05-26 DIAGNOSIS — Z7401 Bed confinement status: Secondary | ICD-10-CM | POA: Diagnosis not present

## 2020-05-26 DIAGNOSIS — E782 Mixed hyperlipidemia: Secondary | ICD-10-CM | POA: Diagnosis not present

## 2020-05-26 DIAGNOSIS — J189 Pneumonia, unspecified organism: Secondary | ICD-10-CM | POA: Diagnosis not present

## 2020-05-26 DIAGNOSIS — R000 Tachycardia, unspecified: Secondary | ICD-10-CM

## 2020-05-26 DIAGNOSIS — L0390 Cellulitis, unspecified: Secondary | ICD-10-CM | POA: Diagnosis not present

## 2020-05-26 DIAGNOSIS — L03119 Cellulitis of unspecified part of limb: Secondary | ICD-10-CM | POA: Diagnosis not present

## 2020-05-26 DIAGNOSIS — I890 Lymphedema, not elsewhere classified: Secondary | ICD-10-CM | POA: Diagnosis not present

## 2020-05-26 LAB — GLUCOSE, CAPILLARY
Glucose-Capillary: 142 mg/dL — ABNORMAL HIGH (ref 70–99)
Glucose-Capillary: 228 mg/dL — ABNORMAL HIGH (ref 70–99)
Glucose-Capillary: 215 mg/dL — ABNORMAL HIGH (ref 70–99)

## 2020-05-26 LAB — SARS CORONAVIRUS 2 (TAT 6-24 HRS): SARS Coronavirus 2: NEGATIVE

## 2020-05-26 MED ORDER — INSULIN ASPART 100 UNIT/ML ~~LOC~~ SOLN: SUBCUTANEOUS | 11 refills | Status: DC

## 2020-05-26 MED ORDER — DOXYCYCLINE HYCLATE 100 MG PO TABS
100.0000 mg | ORAL_TABLET | Freq: Two times a day (BID) | ORAL | 0 refills | Status: DC
Start: 2020-05-26 — End: 2020-06-28

## 2020-05-26 MED ORDER — COLLAGENASE 250 UNIT/GM EX OINT: TOPICAL_OINTMENT | Freq: Every day | CUTANEOUS | 0 refills | Status: DC

## 2020-05-26 MED ORDER — LANTUS SOLOSTAR 100 UNIT/ML ~~LOC~~ SOPN: 20.0000 [IU] | PEN_INJECTOR | Freq: Every day | SUBCUTANEOUS | Status: DC

## 2020-05-26 MED ORDER — LOPERAMIDE HCL 2 MG PO CAPS: 2.0000 mg | ORAL_CAPSULE | ORAL | 0 refills | Status: DC | PRN

## 2020-05-26 MED ORDER — METOPROLOL TARTRATE 25 MG PO TABS: 12.5000 mg | ORAL_TABLET | Freq: Two times a day (BID) | ORAL | Status: DC

## 2020-05-26 NOTE — TOC Transition Note (Addendum)
Transition of Care [facility]) - CM/SW Discharge Note   Patient Details  Name: Julie Mora MRN: [id number] Date of Birth: 03/29/1941  [facility] CM/SW Contact:  Dessa Phi, RN Phone Number: 05/26/2020, 11:38 AM   Clinical Narrative: Faxed info to [facility] w/confirmation.  D/c today to [facility]#[id number]. await covid results.d/c summary faxed to [facility]-await rm#, nsg report tel#.PTAR for transport.    Final next level of care: [place] Barriers to Discharge: No Barriers Identified   Patient Goals and CMS Choice        Discharge Placement              Patient chooses bed at: [place] Patient to be transferred to facility by: [place] Name of family member notified: [name] sister [phone] Patient and family notified of of transfer: 05/26/20  Discharge Plan and Services                                     Social Determinants of Health (SDOH) Interventions     Readmission Risk Interventions No flowsheet data found.

## 2020-05-26 NOTE — Progress Notes (Signed)
Occupational Therapy Progress Note  Patient supervision with increased time and bed height elevated for bed mobility, min A with rollator to stand pivot to bedside commode. Total A perianal care as patient uses a bidet at home after BM. Min A to transfer to recliner chair. Patient require mod cues throughout for safety body mechanics + hand placement with functional transfers. Patient requires increased time for problem solving and min cues for redirection to tasks.     05/26/20 1301  OT Visit Information  Assistance Needed +1  History of Present Illness Patient is a 79 year old female with history of IDDM-2, CKD-3, HTN, HLD, CVA, DVT not on anticoagulation, debility, chronic venous insufficiency and morbid obesity presenting with generalized weakness and left hip pain after recent accidental fall when she missed her recliner when trying to sit down.  Precautions  Precautions Fall  Pain Assessment  Faces Pain Scale 0  Cognition  Arousal/Alertness Awake/alert  Behavior During Therapy WFL for tasks assessed/performed  Overall Cognitive Status No family/caregiver present to determine baseline cognitive functioning  General Comments patient seems more clear today during OT session however still requiring increased time for problem solving   ADL  Overall ADL's  Needs assistance/impaired  Grooming Set up;Wash/dry face;Sitting  Toilet Transfer Minimal assistance;Cueing for safety;Stand-pivot;BSC (rollator)  Toilet Transfer Details (indicate cue type and reason) mod cues for safety with hand placement  Toileting- Clothing Manipulation and Hygiene Total assistance;Sit to/from stand  Toileting - Clothing Manipulation Details (indicate cue type and reason) pt reports using bidet at home for hygiene after BM  Functional mobility during ADLs Minimal assistance;Cueing for safety (rollator)  Bed Mobility  Overal bed mobility Needs Assistance  Bed Mobility Supine to Sit  Supine to sit HOB  elevated;Supervision  General bed mobility comments increased time and effort   Balance  Overall balance assessment Needs assistance  Sitting-balance support Feet supported  Sitting balance-Leahy Scale Fair  Standing balance support Bilateral upper extremity supported  Standing balance-Leahy Scale Poor  Transfers  Overall transfer level Needs assistance  Equipment used 4-wheeled walker  Transfers Sit to/from Stand;Stand Pivot Transfers  Sit to Stand Min assist  Stand pivot transfers Min assist  General transfer comment moderate cues for safety with hand placement, min A to power up to standing from commode  OT - End of Session  Equipment Utilized During Treatment Other (comment) (rollator)  Activity Tolerance Patient tolerated treatment well  Patient left in chair;with call bell/phone within reach;with chair alarm set  Nurse Communication Mobility status  OT Assessment/Plan  OT Plan Discharge plan remains appropriate  OT Visit Diagnosis Unsteadiness on feet (R26.81);Other abnormalities of gait and mobility (R26.89);History of falling (Z91.81);Muscle weakness (generalized) (M62.81)  OT Frequency (ACUTE ONLY) Min 2X/week  Follow Up Recommendations SNF;Supervision/Assistance - 24 hour  OT Equipment Other (comment) (defer to next venue)  AM-PAC OT "6 Clicks" Daily Activity Outcome Measure (Version 2)  Help from another person eating meals? 3  Help from another person taking care of personal grooming? 3  Help from another person toileting, which includes using toliet, bedpan, or urinal? 2  Help from another person bathing (including washing, rinsing, drying)? 2  Help from another person to put on and taking off regular upper body clothing? 3  Help from another person to put on and taking off regular lower body clothing? 1  6 Click Score 14  OT Goal Progression  Progress towards OT goals Progressing toward goals  Acute Rehab OT Goals  Patient Stated Goal agreeable  to up to chair  OT  Goal Formulation With patient  Time For Goal Achievement 06/06/20  Potential to Achieve Goals Good  ADL Goals  Pt Will Perform Upper Body Dressing with set-up;sitting  Pt Will Perform Lower Body Dressing with min assist;with adaptive equipment;sit to/from stand;sitting/lateral leans  Pt Will Transfer to Toilet with min guard assist;ambulating;bedside commode (walker)  Pt Will Perform Toileting - Clothing Manipulation and hygiene with min guard assist;sit to/from stand;sitting/lateral leans  OT Time Calculation  OT Start Time (ACUTE ONLY) 0830  OT Stop Time (ACUTE ONLY) 0908  OT Time Calculation (min) 38 min  OT General Charges  $OT Visit 1 Visit  OT Treatments  $Self Care/Home Management  38-52 mins   Delbert Phenix OT OT pager: [phone]

## 2020-05-26 NOTE — Discharge Summary (Signed)
Physician Discharge Summary  Julie Mora POE:[id number] DOB: March 06, 1941 DOA: 05/21/2020  PCP: Elby Showers, MD  Admit date: 05/21/2020 Discharge date: 05/26/2020  Admitted From: Home Disposition: SNF  Recommendations for Outpatient Follow-up:  1. Follow ups as below. 2. Please obtain CBC/BMP/Mag at follow up 3. Follow-up with vascular and vein specialist.  Ambulatory referral ordered. 4. Please follow up on the following pending results: None  Discharge Condition: Stable CODE STATUS: DNR/DNI   Hospital Course: 79 year old female with history of IDDM-2, CKD-3, HTN, HLD, CVA, DVT not on anticoagulation, debility, chronic venous insufficiency and morbid obesity presenting with generalized weakness, poor p.o. intake and left hip pain after her recent accidental fall when she missed her recliner as she tried to sit down.  She did not strike her head or loss consciousness after fall.  She is vaccinated against COVID-19.  In ED, febrile to 101.3.  HR in 120s.  Slightly tachypneic.  Not hypoxic or hypotensive.  WBC 8.2.  CMP without significant finding.  COVID-19 PCR negative.  UA with large leukocytes and rare bacteria.  Lactic acid 1.3.  Pro-Cal less than 0.10.  VBG normal.  Slightly elevated inflammatory markers including D-dimer and CRP. CXR with patchy heterogeneous opacities in the periphery of the left lung and more mildly in the right lung bases.  Hip and pelvis x-ray without acute finding.  RVP, blood cultures and LLE [place] ordered.  Patient was admitted for sepsis due to atypical pneumonia, UTI and bilateral lower extremity cellulitis.  She was a started on ceftriaxone and azithromycin.   RVP and bilateral lower extremity venous Doppler negative.  Blood cultures NGTD.  CTA chest negative for PE or pneumonia but chronic coronary artery calcification.  Urine culture with Enterococcus faecalis but patient without UTI symptoms.  Superficial wound cultures NGTD.    Evaluated by wound care  specialist who recommended outpatient follow-up with vascular/vein specialist.  Ambulatory referral ordered.  Evaluated by therapy who recommended SNF.  See individual problem list below for more on hospital course.    Discharge Diagnoses:  Sepsis secondary to BLE purulent cellulitis likely due to chronic venous insufficiency/lymphedema:  RVP, procalcitonin and CTA chest negative.  Blood cultures NGTD. Pneumonia ruled out.  Urine culture with Enterococcus faecalis but no UTI symptoms.  She met sepsis criteria on admission with fever, tachycardia, tachypnea and lower extremity purulent cellulitis.  Sepsis physiology resolved. -IV CTX 9/4.  Switch to p.o. doxycycline 9/5> 9/16. No history of MRSA.  MRSA PCR negative. -Wound cultures NGTD.  Follow final cultures. -Continue applying collagenase Santyl ointment daily -Continue home torsemide -Ambulatory referral to pain specialist ordered.  Ensure follow-up.   Uncontrolled IDDM-2 with hyperglycemia: A1c 10.3%. -Discharged on Lantus 20 units daily and SSI moderate -Consider GLP-1 inhibitors given morbid obesity -Continue home statin.  CKD-3A: Stable.  Generalized weakness/recent fall/debility-uses Rollator at home.  Lives alone. -Continue PT/OT at rehab.  Hyperlipidemia: Stable.  Pneumonia-ruled out.  Enterococcus faecalis bacteriuria: Urine culture with Enterococcus faecalis.    No UTI symptoms throughout her hospitalization.  Elevated D-dimer: PE and DVT ruled out.  COVID-19 PCR negative.  Sinus tachycardia: Could be due to sepsis.  Resolved. -Continue low-dose metoprolol  Morbid obesity Body mass index is 46.64 kg/m. -Consider GLP-1 inhibitors given diabetes    Body mass index is 46.64 kg/m.         Pressure Injury 01/20/17 Stage II -  Partial thickness loss of dermis presenting as a shallow open ulcer with a red, pink wound bed  without slough. (Active)  01/20/17 2039  Location: Buttocks  Location  Orientation: Left  Staging: Stage II -  Partial thickness loss of dermis presenting as a shallow open ulcer with a red, pink wound bed without slough.  Wound Description (Comments):   Present on Admission: Yes    Discharge Exam: Vitals:   05/25/20 2117 05/26/20 0535  BP: (!) 155/99 120/82  Pulse: 76 88  Resp: 20 19  Temp: 98.2 F (36.8 C) 98 F (36.7 C)  SpO2: 98% 94%    GENERAL: No apparent distress.  Nontoxic. HEENT: MMM.  Vision and hearing grossly intact.  NECK: Supple.  No apparent JVD.  RESP: On room air.  No IWOB.  Fair aeration bilaterally. CVS:  RRR. Heart sounds normal.  ABD/GI/GU: Bowel sounds present. Soft. Non tender.  MSK/EXT:  Moves extremities.  Chronic venous insufficiency with tree bark skin SKIN: Trim back the skin with erythema in BLE.  Some increased warmth to touch.  Small ulceration in RLE anteriorly. NEURO: Awake, alert and oriented appropriately.  No apparent focal neuro deficit. PSYCH: Calm. Normal affect.  Discharge Instructions  Discharge Instructions    Ambulatory referral to Vascular Surgery   Complete by: As directed    [facility] [place], [place] [address] [phone].   Diet - low sodium heart healthy   Complete by: As directed    Diet Carb Modified   Complete by: As directed    Increase activity slowly   Complete by: As directed    No dressing needed   Complete by: As directed    Apply Santyl ointment daily     Allergies as of 05/26/2020      Reactions   Azathioprine Shortness Of Breath, Other (See Comments)   severly allergic. nausea, diarrhea, low blood pressure, fever, chills, rash. was hospitalized due to side effects in feb 2012.   Cellcept [mycophenolate Mofetil] Nausea Only   severe nausea   Ramipril Cough      Medication List    STOP taking these medications   alendronate 70 MG tablet Commonly known as: FOSAMAX     TAKE these medications   Accu-Chek Aviva Plus test strip Generic  drug: glucose blood USE TO CHECK BLOOD SUGAR BEFORE BREAKFAST AND SUPPER   acetaminophen 500 MG tablet Commonly known as: TYLENOL Take 500 mg by mouth every 6 (six) hours as needed for pain.   BD Pen Needle Nano U/F 32G X 4 MM Misc Generic drug: Insulin Pen Needle USE AS DIRECTED   collagenase ointment Commonly known as: SANTYL Apply topically daily. To both legs   doxycycline 100 MG tablet Commonly known as: VIBRA-TABS Take 1 tablet (100 mg total) by mouth every 12 (twelve) hours.   ferrous gluconate 324 MG tablet Commonly known as: FERGON TAKE 1 TABLET(324 MG) BY MOUTH DAILY WITH BREAKFAST   gabapentin 100 MG capsule Commonly known as: NEURONTIN Take 100 mg by mouth at bedtime.   insulin aspart 100 UNIT/ML injection Commonly known as: novoLOG 0-15 Units, Subcutaneous, 3 times daily with meals, First dose on Sat 05/22/20 at 1700 Correction coverage: Moderate (average weight, post-op) CBG < 70: Implement Hypoglycemia Standing Orders and refer to Hypoglycemia Standing Orders sidebar report CBG 70 - 120: 0 units CBG 121 - 150: 2 units CBG 151 - 200: 3 units CBG 201 - 250: 5 units CBG 251 - 300: 8 units CBG 301 - 350: 11 units CBG 351 - 400: 15 units CBG > 400: call MD  What changed:   how much to take  how to take this  when to take this  reasons to take this  additional instructions   Lantus SoloStar 100 UNIT/ML Solostar Pen Generic drug: insulin glargine Inject 20 Units into the skin daily. What changed:   how much to take  how to take this  when to take this  additional instructions   loperamide 2 MG capsule Commonly known as: IMODIUM Take 1 capsule (2 mg total) by mouth as needed for diarrhea or loose stools.   potassium chloride 10 MEQ tablet Commonly known as: KLOR-CON TAKE 1 TABLET(10 MEQ) BY MOUTH DAILY   prednisoLONE acetate 1 % ophthalmic suspension Commonly known as: PRED FORTE Place 1 drop into both eyes in the morning and at  bedtime.   simvastatin 20 MG tablet Commonly known as: ZOCOR TAKE 1 TABLET BY MOUTH EVERY NIGHT AT BEDTIME   SYSTANE FREE OP Apply to eye daily.   torsemide 20 MG tablet Commonly known as: DEMADEX TAKE 1 TABLET(20 MG) BY MOUTH DAILY   triamcinolone cream 0.1 % Commonly known as: KENALOG Apply 1 application topically daily.            Discharge Care Instructions  (From admission, onward)         Start     Ordered   05/26/20 0000  No dressing needed       Comments: Apply Santyl ointment daily   05/26/20 1610          Consultations:  None  Procedures/Studies:   DG Chest 2 View  Result Date: 05/21/2020 CLINICAL DATA:  Fever weakness EXAM: CHEST - 2 VIEW COMPARISON:  Radiograph 06/10/2017 FINDINGS: Patchy heterogeneous opacities are seen in the periphery of the left lung and more mildly in right lung base. No pneumothorax or effusion. Cardiomediastinal contours are similar to priors accounting for differences in technique. No acute osseous or soft tissue abnormality. Degenerative changes are present in the imaged spine and shoulders. Few remote right rib deformities are again noted. IMPRESSION: Patchy heterogeneous opacities in the periphery of the left lung and more mildly in the right lung base, suspicious for pneumonia including atypical viral etiologies such as COVID-19. Electronically Signed   By: Lovena Le M.D.   On: 05/21/2020 22:30   CT ANGIO CHEST PE W OR WO CONTRAST  Result Date: 05/22/2020 CLINICAL DATA:  Fatigue, hip pain following fall 2 days ago. Multifocal pneumonia concerning for COVID. PE suspected, high probability. EXAM: CT ANGIOGRAPHY CHEST WITH CONTRAST TECHNIQUE: Multidetector CT imaging of the chest was performed using the standard protocol during bolus administration of intravenous contrast. Multiplanar CT image reconstructions and MIPs were obtained to evaluate the vascular anatomy. CONTRAST:  24m OMNIPAQUE IOHEXOL 350 MG/ML SOLN COMPARISON:   Chest x-ray dated 05/21/2020. FINDINGS: Cardiovascular: Majority of the peripheral segmental and subsegmental pulmonary artery branches are difficult to characterize due to patient breathing motion artifact. There is no pulmonary embolism seen within the main or central lobar pulmonary arteries. Aortic atherosclerosis. No thoracic aortic aneurysm no pericardial effusion. Coronary artery calcifications. Mediastinum/Nodes: No mass or enlarged lymph nodes are seen within the mediastinum. Esophagus is unremarkable. Trachea is unremarkable. Lungs/Pleura: Subtle mosaic pattern within each lung, LEFT greater than RIGHT, suggesting air trapping. No consolidation or confluent ground-glass opacity to suggest pneumonia. No pleural effusion or pneumothorax. Upper Abdomen: Limited images of the upper abdomen are unremarkable. Musculoskeletal: Degenerative spondylosis of the kyphotic thoracic spine, moderate in degree. No acute appearing osseous abnormality. Review of  the MIP images confirms the above findings. IMPRESSION: 1. No acute findings. No evidence of pneumonia or pulmonary edema. No pulmonary embolism seen, with study limitations detailed above. 2. Subtle mosaic pattern within each lung, LEFT greater than RIGHT, suggesting air trapping, likely chronic. 3. Coronary artery calcifications. Aortic Atherosclerosis (ICD10-I70.0). Electronically Signed   By: Franki Cabot M.D.   On: 05/22/2020 07:17   DG HIP UNILAT W OR W/O PELVIS 2-3 VIEWS LEFT  Result Date: 05/22/2020 CLINICAL DATA:  Fall EXAM: DG HIP (WITH OR WITHOUT PELVIS) 2-3V LEFT COMPARISON:  None. FINDINGS: There is no evidence of hip fracture or dislocation. There is no evidence of arthropathy or other focal bone abnormality. IMPRESSION: Negative. Electronically Signed   By: Ulyses Jarred M.D.   On: 05/22/2020 00:19   VAS [place] LOWER EXTREMITY VENOUS (DVT)  Result Date: 05/23/2020  Lower Venous DVTStudy Indications: Swelling, Erythema, and Pain.  Limitations:  Technically difficult study due to patient body habitus, patient pain tolerance, and poor ultrasound/tissue interface due to skin changes. Comparison Study: No prior studies. Performing Technologist: Darlin Coco  Examination Guidelines: A complete evaluation includes B-mode imaging, spectral Doppler, color Doppler, and power Doppler as needed of all accessible portions of each vessel. Bilateral testing is considered an integral part of a complete examination. Limited examinations for reoccurring indications may be performed as noted. The reflux portion of the exam is performed with the patient in reverse Trendelenburg.  +---------+---------------+---------+-----------+----------+--------------+ RIGHT    CompressibilityPhasicitySpontaneityPropertiesThrombus Aging +---------+---------------+---------+-----------+----------+--------------+ CFV                     Yes      Yes                                 +---------+---------------+---------+-----------+----------+--------------+ SFJ                     Yes      Yes                                 +---------+---------------+---------+-----------+----------+--------------+ FV Prox  Full           Yes      Yes                                 +---------+---------------+---------+-----------+----------+--------------+ FV Mid                                                Not visualized +---------+---------------+---------+-----------+----------+--------------+ FV Distal                                             Not visualized +---------+---------------+---------+-----------+----------+--------------+ PFV      Full                                                        +---------+---------------+---------+-----------+----------+--------------+ POP  Not visualized +---------+---------------+---------+-----------+----------+--------------+ PTV                                                    Not visualized +---------+---------------+---------+-----------+----------+--------------+ PERO                                                  Not visualized +---------+---------------+---------+-----------+----------+--------------+   +---------+---------------+---------+-----------+----------+--------------+ LEFT     CompressibilityPhasicitySpontaneityPropertiesThrombus Aging +---------+---------------+---------+-----------+----------+--------------+ CFV                     Yes      Yes                                 +---------+---------------+---------+-----------+----------+--------------+ SFJ                     Yes      Yes                                 +---------+---------------+---------+-----------+----------+--------------+ FV Prox  Full           Yes      Yes                                 +---------+---------------+---------+-----------+----------+--------------+ FV Mid   Full           Yes      Yes                                 +---------+---------------+---------+-----------+----------+--------------+ FV Distal                                             Not visualized +---------+---------------+---------+-----------+----------+--------------+ PFV                     Yes      Yes                                 +---------+---------------+---------+-----------+----------+--------------+ POP                     Yes      Yes                                 +---------+---------------+---------+-----------+----------+--------------+ PTV                                                   Not visualized +---------+---------------+---------+-----------+----------+--------------+ PERO  Not visualized +---------+---------------+---------+-----------+----------+--------------+     Summary: RIGHT: - There is no evidence of deep vein thrombosis in the  visualized portions of the lower extremity. However, portions of this examination were limited- see technologist comments above.  LEFT: - There is no evidence of deep vein thrombosis in the visualized portions of the lower extremity. However, portions of this examination were limited- see technologist comments above.  *See table(s) above for measurements and observations. Electronically signed by Ruta Hinds MD on 05/23/2020 at 10:12:00 AM.    Final         The results of significant diagnostics from this hospitalization (including imaging, microbiology, ancillary and laboratory) are listed below for reference.     Microbiology: Recent Results (from the past 240 hour(s))  Culture, Urine     Status: Abnormal   Collection Time: 05/21/20 10:06 PM   Specimen: Urine, Random  Result Value Ref Range Status   Specimen Description   Final    URINE, RANDOM Performed at [facility] [address], [place], [place] [address]    Special Requests   Final    NONE Performed at [facility], [place] [address] [place], [place] [address]    Culture >=100,000 COLONIES/mL ENTEROCOCCUS FAECALIS (A)  Final   Report Status 05/24/2020 FINAL  Final   Organism ID, Bacteria ENTEROCOCCUS FAECALIS (A)  Final      Susceptibility   Enterococcus faecalis - MIC*    AMPICILLIN <=2 SENSITIVE Sensitive     NITROFURANTOIN <=16 SENSITIVE Sensitive     VANCOMYCIN 1 SENSITIVE Sensitive     * >=100,000 COLONIES/mL ENTEROCOCCUS FAECALIS  SARS Coronavirus 2 by RT PCR (hospital order, performed in [facility] hospital lab) Nasopharyngeal Nasopharyngeal Swab     Status: None   Collection Time: 05/21/20 10:45 PM   Specimen: Nasopharyngeal Swab  Result Value Ref Range Status   SARS Coronavirus 2 NEGATIVE NEGATIVE Final    Comment: (NOTE) SARS-CoV-2 target nucleic acids are NOT DETECTED.  The SARS-CoV-2 RNA is generally detectable in upper and lower respiratory specimens during the  acute phase of infection. The lowest concentration of SARS-CoV-2 viral copies this assay can detect is 250 copies / mL. A negative result does not preclude SARS-CoV-2 infection and should not be used as the sole basis for treatment or other patient management decisions.  A negative result may occur with improper specimen collection / handling, submission of specimen other than nasopharyngeal swab, presence of viral mutation(s) within the areas targeted by this assay, and inadequate number of viral copies (<250 copies / mL). A negative result must be combined with clinical observations, patient history, and epidemiological information.  Fact Sheet for Patients:   StrictlyIdeas.no  Fact Sheet for Healthcare Providers: BankingDealers.co.za  This test is not yet approved or  cleared by the [place] FDA and has been authorized for detection and/or diagnosis of SARS-CoV-2 by FDA under an Emergency Use Authorization (EUA).  This EUA will remain in effect (meaning this test can be used) for the duration of the COVID-19 declaration under Section 564(b)(1) of the Act, 21 U.S.C. section 360bbb-3(b)(1), unless the authorization is terminated or revoked sooner.  Performed at [facility], [place] [address] [place], [place] [address]   Culture, blood (routine x 2)     Status: None (Preliminary result)   Collection Time: 05/21/20 10:45 PM   Specimen: BLOOD LEFT FOREARM  Result Value Ref Range Status   Specimen Description   Final  BLOOD LEFT FOREARM Performed at [facility] [address], [place], [place] [address]    Special Requests   Final    BOTTLES DRAWN AEROBIC AND ANAEROBIC Blood Culture results may not be optimal due to an inadequate volume of blood received in culture bottles Performed at [facility] [address] [place], [place] [address]    Culture   Final    NO  GROWTH 3 DAYS Performed at [facility], [place] [address], [place], [place] [address]    Report Status PENDING  Incomplete  Culture, blood (routine x 2)     Status: None (Preliminary result)   Collection Time: 05/21/20 10:45 PM   Specimen: BLOOD RIGHT ARM  Result Value Ref Range Status   Specimen Description   Final    BLOOD RIGHT ARM Performed at [facility], [place] [address] [place], [place] [address]    Special Requests   Final    BOTTLES DRAWN AEROBIC AND ANAEROBIC Blood Culture adequate volume Performed at [facility] [address] [place], [place] [address]    Culture   Final    NO GROWTH 3 DAYS Performed at [facility], [place] [address] [place], [place] [address]    Report Status PENDING  Incomplete  Respiratory Panel by PCR     Status: None   Collection Time: 05/22/20  2:40 AM   Specimen: Nasopharyngeal Swab; Respiratory  Result Value Ref Range Status   Adenovirus NOT DETECTED NOT DETECTED Final   Coronavirus 229E NOT DETECTED NOT DETECTED Final    Comment: (NOTE) The Coronavirus on the Respiratory Panel, DOES NOT test for the novel  Coronavirus (2019 nCoV)    Coronavirus HKU1 NOT DETECTED NOT DETECTED Final   Coronavirus NL63 NOT DETECTED NOT DETECTED Final   Coronavirus OC43 NOT DETECTED NOT DETECTED Final   Metapneumovirus NOT DETECTED NOT DETECTED Final   Rhinovirus / Enterovirus NOT DETECTED NOT DETECTED Final   Influenza A NOT DETECTED NOT DETECTED Final   Influenza B NOT DETECTED NOT DETECTED Final   Parainfluenza Virus 1 NOT DETECTED NOT DETECTED Final   Parainfluenza Virus 2 NOT DETECTED NOT DETECTED Final   Parainfluenza Virus 3 NOT DETECTED NOT DETECTED Final   Parainfluenza Virus 4 NOT DETECTED NOT DETECTED Final   Respiratory Syncytial Virus NOT DETECTED NOT DETECTED Final   Bordetella pertussis NOT DETECTED NOT DETECTED Final   Chlamydophila pneumoniae NOT DETECTED NOT DETECTED Final   Mycoplasma pneumoniae NOT  DETECTED NOT DETECTED Final    Comment: Performed at [facility], [place]. [address] [place], [place] [address]  Aerobic/Anaerobic Culture (surgical/deep wound)     Status: None (Preliminary result)   Collection Time: 05/23/20  7:04 PM   Specimen: Leg  Result Value Ref Range Status   Specimen Description   Final    LEG Performed at [facility] [address], [place], [place] [address]    Special Requests   Final    LEFT Performed at [facility], [place] [address] [place], [place] [address]    Gram Stain   Final    RARE WBC PRESENT, PREDOMINANTLY PMN RARE GRAM POSITIVE COCCI IN PAIRS IN CLUSTERS Performed at [facility], [place] [address], [place], [place] [address]    Culture   Final    CULTURE REINCUBATED FOR BETTER GROWTH NO ANAEROBES ISOLATED; CULTURE IN PROGRESS FOR 5 DAYS    Report Status  PENDING  Incomplete  Aerobic/Anaerobic Culture (surgical/deep wound)     Status: None (Preliminary result)   Collection Time: 05/23/20  7:04 PM   Specimen: Leg  Result Value Ref Range Status   Specimen Description   Final    LEG Performed at [facility] [address] [place], [place] [address]    Special Requests   Final    RIGHT Performed at [facility], [place] [address], [place], [place] [address]    Gram Stain   Final    RARE WBC PRESENT, PREDOMINANTLY PMN FEW GRAM POSITIVE COCCI IN PAIRS IN CLUSTERS Performed at [facility], [place] [address] [place], [place] [address]    Culture   Final    CULTURE REINCUBATED FOR BETTER GROWTH NO ANAEROBES ISOLATED; CULTURE IN PROGRESS FOR 5 DAYS    Report Status PENDING  Incomplete     Labs: BNP (last 3 results) No results for input(s): BNP in the last 8760 hours. Basic Metabolic Panel: Recent Labs  Lab 05/21/20 2245 05/23/20 0618 05/25/20 0520  NA 138 138 139  K 4.7 3.9 3.7  CL 105 107 109  CO2 '24 22 23  ' GLUCOSE 185* 117* 192*  BUN  27* 20 19  CREATININE 1.25* 1.06* 1.00  CALCIUM 8.8* 8.5* 8.1*  MG  --  1.8 1.7  PHOS  --  3.0 3.0   Liver Function Tests: Recent Labs  Lab 05/21/20 2245 05/23/20 0618 05/25/20 0520  AST 27  --   --   ALT 15  --   --   ALKPHOS 72  --   --   BILITOT 0.3  --   --   PROT 7.0  --   --   ALBUMIN 2.9* 2.6* 2.3*   No results for input(s): LIPASE, AMYLASE in the last 168 hours. No results for input(s): AMMONIA in the last 168 hours. CBC: Recent Labs  Lab 05/21/20 2245  WBC 8.2  NEUTROABS 6.1  HGB 13.6  HCT 42.5  MCV 99.5  PLT 218   Cardiac Enzymes: No results for input(s): CKTOTAL, CKMB, CKMBINDEX, TROPONINI in the last 168 hours. BNP: Invalid input(s): POCBNP CBG: Recent Labs  Lab 05/25/20 0743 05/25/20 1139 05/25/20 1644 05/25/20 2122 05/26/20 0753  GLUCAP 198* 249* 187* 206* 142*   D-Dimer No results for input(s): DDIMER in the last 72 hours. Hgb A1c No results for input(s): HGBA1C in the last 72 hours. Lipid Profile No results for input(s): CHOL, HDL, LDLCALC, TRIG, CHOLHDL, LDLDIRECT in the last 72 hours. Thyroid function studies No results for input(s): TSH, T4TOTAL, T3FREE, THYROIDAB in the last 72 hours.  Invalid input(s): FREET3 Anemia work up No results for input(s): VITAMINB12, FOLATE, FERRITIN, TIBC, IRON, RETICCTPCT in the last 72 hours. Urinalysis    Component Value Date/Time   COLORURINE YELLOW 05/21/2020 2206   APPEARANCEUR CLOUDY (A) 05/21/2020 2206   LABSPEC 1.010 05/21/2020 2206   PHURINE 6.0 05/21/2020 2206   GLUCOSEU NEGATIVE 05/21/2020 2206   HGBUR SMALL (A) 05/21/2020 2206   BILIRUBINUR NEGATIVE 05/21/2020 2206   BILIRUBINUR NEG 08/21/2019 1134   KETONESUR NEGATIVE 05/21/2020 2206   PROTEINUR 30 (A) 05/21/2020 2206   UROBILINOGEN 0.2 08/21/2019 1134   UROBILINOGEN 0.2 12/11/2010 1434   NITRITE NEGATIVE 05/21/2020 2206   LEUKOCYTESUR LARGE (A) 05/21/2020 2206   Sepsis Labs Invalid input(s): PROCALCITONIN,  WBC,   LACTICIDVEN   Time coordinating discharge: 35 minutes  SIGNED:  Mercy Riding, MD  [facility] 05/26/2020,  8:22 AM  If 7PM-7AM, please contact night-coverage [email or web address]

## 2020-05-26 NOTE — Progress Notes (Signed)
Attempted to call report to [facility].  Left voicemail with name and phone number to call back.  Covid resulted negative.  VSS.  [facility] called and packet ready for pick up.

## 2020-05-26 NOTE — Progress Notes (Signed)
Report called and given to [facility].  All questions answered.  Covid results faxed to [facility].

## 2020-05-26 NOTE — Care Management Important Message (Signed)
Important Message  Patient Details IM Letter given to the Patient Name: Julie Mora MRN: [id number] Date of Birth: 1941/01/05   Medicare Important Message Given:  Yes     Kerin Salen 05/26/2020, 11:38 AM

## 2020-05-27 DIAGNOSIS — E782 Mixed hyperlipidemia: Secondary | ICD-10-CM | POA: Diagnosis not present

## 2020-05-27 DIAGNOSIS — I872 Venous insufficiency (chronic) (peripheral): Secondary | ICD-10-CM | POA: Diagnosis not present

## 2020-05-27 DIAGNOSIS — N183 Chronic kidney disease, stage 3 unspecified: Secondary | ICD-10-CM | POA: Diagnosis not present

## 2020-05-27 DIAGNOSIS — I639 Cerebral infarction, unspecified: Secondary | ICD-10-CM | POA: Diagnosis not present

## 2020-05-27 DIAGNOSIS — Z86718 Personal history of other venous thrombosis and embolism: Secondary | ICD-10-CM | POA: Diagnosis not present

## 2020-05-27 DIAGNOSIS — E119 Type 2 diabetes mellitus without complications: Secondary | ICD-10-CM | POA: Diagnosis not present

## 2020-05-27 DIAGNOSIS — I10 Essential (primary) hypertension: Secondary | ICD-10-CM | POA: Diagnosis not present

## 2020-05-27 DIAGNOSIS — R5381 Other malaise: Secondary | ICD-10-CM | POA: Diagnosis not present

## 2020-05-27 LAB — CULTURE, BLOOD (ROUTINE X 2)
Culture: NO GROWTH
Culture: NO GROWTH
Special Requests: ADEQUATE

## 2020-05-28 DIAGNOSIS — A419 Sepsis, unspecified organism: Secondary | ICD-10-CM | POA: Diagnosis not present

## 2020-05-28 DIAGNOSIS — I890 Lymphedema, not elsewhere classified: Secondary | ICD-10-CM | POA: Diagnosis not present

## 2020-05-28 DIAGNOSIS — E1122 Type 2 diabetes mellitus with diabetic chronic kidney disease: Secondary | ICD-10-CM | POA: Diagnosis not present

## 2020-05-28 DIAGNOSIS — L0390 Cellulitis, unspecified: Secondary | ICD-10-CM | POA: Diagnosis not present

## 2020-05-28 LAB — AEROBIC/ANAEROBIC CULTURE W GRAM STAIN (SURGICAL/DEEP WOUND)

## 2020-05-31 ENCOUNTER — Telehealth: Payer: Self-pay

## 2020-05-31 NOTE — Telephone Encounter (Signed)
Left detailed message, needs to call me back to go over her meds.

## 2020-06-01 DIAGNOSIS — I639 Cerebral infarction, unspecified: Secondary | ICD-10-CM | POA: Diagnosis not present

## 2020-06-01 DIAGNOSIS — E119 Type 2 diabetes mellitus without complications: Secondary | ICD-10-CM | POA: Diagnosis not present

## 2020-06-01 DIAGNOSIS — Z86718 Personal history of other venous thrombosis and embolism: Secondary | ICD-10-CM | POA: Diagnosis not present

## 2020-06-01 DIAGNOSIS — N183 Chronic kidney disease, stage 3 unspecified: Secondary | ICD-10-CM | POA: Diagnosis not present

## 2020-06-01 DIAGNOSIS — I10 Essential (primary) hypertension: Secondary | ICD-10-CM | POA: Diagnosis not present

## 2020-06-01 DIAGNOSIS — E782 Mixed hyperlipidemia: Secondary | ICD-10-CM | POA: Diagnosis not present

## 2020-06-01 DIAGNOSIS — I872 Venous insufficiency (chronic) (peripheral): Secondary | ICD-10-CM | POA: Diagnosis not present

## 2020-06-03 ENCOUNTER — Inpatient Hospital Stay: Payer: Medicare PPO | Admitting: Internal Medicine

## 2020-06-03 DIAGNOSIS — Z86718 Personal history of other venous thrombosis and embolism: Secondary | ICD-10-CM | POA: Diagnosis not present

## 2020-06-03 DIAGNOSIS — E782 Mixed hyperlipidemia: Secondary | ICD-10-CM | POA: Diagnosis not present

## 2020-06-03 DIAGNOSIS — I10 Essential (primary) hypertension: Secondary | ICD-10-CM | POA: Diagnosis not present

## 2020-06-03 DIAGNOSIS — E119 Type 2 diabetes mellitus without complications: Secondary | ICD-10-CM | POA: Diagnosis not present

## 2020-06-03 DIAGNOSIS — N183 Chronic kidney disease, stage 3 unspecified: Secondary | ICD-10-CM | POA: Diagnosis not present

## 2020-06-03 DIAGNOSIS — I872 Venous insufficiency (chronic) (peripheral): Secondary | ICD-10-CM | POA: Diagnosis not present

## 2020-06-19 DIAGNOSIS — N1831 Chronic kidney disease, stage 3a: Secondary | ICD-10-CM | POA: Diagnosis not present

## 2020-06-19 DIAGNOSIS — I890 Lymphedema, not elsewhere classified: Secondary | ICD-10-CM | POA: Diagnosis not present

## 2020-06-19 DIAGNOSIS — I2584 Coronary atherosclerosis due to calcified coronary lesion: Secondary | ICD-10-CM | POA: Diagnosis not present

## 2020-06-19 DIAGNOSIS — E1151 Type 2 diabetes mellitus with diabetic peripheral angiopathy without gangrene: Secondary | ICD-10-CM | POA: Diagnosis not present

## 2020-06-19 DIAGNOSIS — E1122 Type 2 diabetes mellitus with diabetic chronic kidney disease: Secondary | ICD-10-CM | POA: Diagnosis not present

## 2020-06-19 DIAGNOSIS — I129 Hypertensive chronic kidney disease with stage 1 through stage 4 chronic kidney disease, or unspecified chronic kidney disease: Secondary | ICD-10-CM | POA: Diagnosis not present

## 2020-06-19 DIAGNOSIS — I872 Venous insufficiency (chronic) (peripheral): Secondary | ICD-10-CM | POA: Diagnosis not present

## 2020-06-19 DIAGNOSIS — E1165 Type 2 diabetes mellitus with hyperglycemia: Secondary | ICD-10-CM | POA: Diagnosis not present

## 2020-06-19 DIAGNOSIS — I700 Atherosclerosis of aorta: Secondary | ICD-10-CM | POA: Diagnosis not present

## 2020-06-21 DIAGNOSIS — I2584 Coronary atherosclerosis due to calcified coronary lesion: Secondary | ICD-10-CM | POA: Diagnosis not present

## 2020-06-21 DIAGNOSIS — I129 Hypertensive chronic kidney disease with stage 1 through stage 4 chronic kidney disease, or unspecified chronic kidney disease: Secondary | ICD-10-CM | POA: Diagnosis not present

## 2020-06-21 DIAGNOSIS — I700 Atherosclerosis of aorta: Secondary | ICD-10-CM | POA: Diagnosis not present

## 2020-06-21 DIAGNOSIS — E1151 Type 2 diabetes mellitus with diabetic peripheral angiopathy without gangrene: Secondary | ICD-10-CM | POA: Diagnosis not present

## 2020-06-21 DIAGNOSIS — I872 Venous insufficiency (chronic) (peripheral): Secondary | ICD-10-CM | POA: Diagnosis not present

## 2020-06-21 DIAGNOSIS — E1165 Type 2 diabetes mellitus with hyperglycemia: Secondary | ICD-10-CM | POA: Diagnosis not present

## 2020-06-21 DIAGNOSIS — E1122 Type 2 diabetes mellitus with diabetic chronic kidney disease: Secondary | ICD-10-CM | POA: Diagnosis not present

## 2020-06-21 DIAGNOSIS — N1831 Chronic kidney disease, stage 3a: Secondary | ICD-10-CM | POA: Diagnosis not present

## 2020-06-21 DIAGNOSIS — I890 Lymphedema, not elsewhere classified: Secondary | ICD-10-CM | POA: Diagnosis not present

## 2020-06-22 DIAGNOSIS — I872 Venous insufficiency (chronic) (peripheral): Secondary | ICD-10-CM | POA: Diagnosis not present

## 2020-06-22 DIAGNOSIS — I890 Lymphedema, not elsewhere classified: Secondary | ICD-10-CM | POA: Diagnosis not present

## 2020-06-22 DIAGNOSIS — I2584 Coronary atherosclerosis due to calcified coronary lesion: Secondary | ICD-10-CM | POA: Diagnosis not present

## 2020-06-22 DIAGNOSIS — N1831 Chronic kidney disease, stage 3a: Secondary | ICD-10-CM | POA: Diagnosis not present

## 2020-06-22 DIAGNOSIS — I129 Hypertensive chronic kidney disease with stage 1 through stage 4 chronic kidney disease, or unspecified chronic kidney disease: Secondary | ICD-10-CM | POA: Diagnosis not present

## 2020-06-22 DIAGNOSIS — E1151 Type 2 diabetes mellitus with diabetic peripheral angiopathy without gangrene: Secondary | ICD-10-CM | POA: Diagnosis not present

## 2020-06-22 DIAGNOSIS — I700 Atherosclerosis of aorta: Secondary | ICD-10-CM | POA: Diagnosis not present

## 2020-06-22 DIAGNOSIS — E1165 Type 2 diabetes mellitus with hyperglycemia: Secondary | ICD-10-CM | POA: Diagnosis not present

## 2020-06-22 DIAGNOSIS — E1122 Type 2 diabetes mellitus with diabetic chronic kidney disease: Secondary | ICD-10-CM | POA: Diagnosis not present

## 2020-06-23 NOTE — Telephone Encounter (Signed)
Patient called back, she had been discharged from [facility] on 06/08/2020, so I went ahead and scheduled her for hospital follow up for 06/28/2020. She also said that [facility] had been out about Home Health so I let her know we need to have this appointment so she could discuss all of this with Dr Renold Genta so she could sign off on orders. She stated she would try her best to come to appointment.

## 2020-06-24 DIAGNOSIS — I872 Venous insufficiency (chronic) (peripheral): Secondary | ICD-10-CM | POA: Diagnosis not present

## 2020-06-24 DIAGNOSIS — I2584 Coronary atherosclerosis due to calcified coronary lesion: Secondary | ICD-10-CM | POA: Diagnosis not present

## 2020-06-24 DIAGNOSIS — E1151 Type 2 diabetes mellitus with diabetic peripheral angiopathy without gangrene: Secondary | ICD-10-CM | POA: Diagnosis not present

## 2020-06-24 DIAGNOSIS — I890 Lymphedema, not elsewhere classified: Secondary | ICD-10-CM | POA: Diagnosis not present

## 2020-06-24 DIAGNOSIS — E1165 Type 2 diabetes mellitus with hyperglycemia: Secondary | ICD-10-CM | POA: Diagnosis not present

## 2020-06-24 DIAGNOSIS — N1831 Chronic kidney disease, stage 3a: Secondary | ICD-10-CM | POA: Diagnosis not present

## 2020-06-24 DIAGNOSIS — I700 Atherosclerosis of aorta: Secondary | ICD-10-CM | POA: Diagnosis not present

## 2020-06-24 DIAGNOSIS — E1122 Type 2 diabetes mellitus with diabetic chronic kidney disease: Secondary | ICD-10-CM | POA: Diagnosis not present

## 2020-06-24 DIAGNOSIS — I129 Hypertensive chronic kidney disease with stage 1 through stage 4 chronic kidney disease, or unspecified chronic kidney disease: Secondary | ICD-10-CM | POA: Diagnosis not present

## 2020-06-25 ENCOUNTER — Telehealth: Payer: Self-pay | Admitting: Internal Medicine

## 2020-06-25 NOTE — Telephone Encounter (Signed)
RN / [facility] called to say they had been out to assess and she just wanted to give an FYI that patient has cellulitis and they are going to need nursing orders after her office visit on Monday. I did let her know we do not do verbal orders.

## 2020-06-28 ENCOUNTER — Telehealth: Payer: Self-pay | Admitting: Internal Medicine

## 2020-06-28 ENCOUNTER — Encounter: Payer: Self-pay | Admitting: Internal Medicine

## 2020-06-28 ENCOUNTER — Ambulatory Visit (INDEPENDENT_AMBULATORY_CARE_PROVIDER_SITE_OTHER): Payer: Medicare PPO | Admitting: Internal Medicine

## 2020-06-28 ENCOUNTER — Other Ambulatory Visit: Payer: Self-pay

## 2020-06-28 ENCOUNTER — Telehealth: Payer: Self-pay

## 2020-06-28 VITALS — BP 120/60 | HR 123 | Temp 97.8°F

## 2020-06-28 DIAGNOSIS — E1122 Type 2 diabetes mellitus with diabetic chronic kidney disease: Secondary | ICD-10-CM | POA: Diagnosis not present

## 2020-06-28 DIAGNOSIS — E1169 Type 2 diabetes mellitus with other specified complication: Secondary | ICD-10-CM

## 2020-06-28 DIAGNOSIS — N1831 Chronic kidney disease, stage 3a: Secondary | ICD-10-CM | POA: Diagnosis not present

## 2020-06-28 DIAGNOSIS — I890 Lymphedema, not elsewhere classified: Secondary | ICD-10-CM | POA: Diagnosis not present

## 2020-06-28 DIAGNOSIS — I700 Atherosclerosis of aorta: Secondary | ICD-10-CM | POA: Diagnosis not present

## 2020-06-28 DIAGNOSIS — I2584 Coronary atherosclerosis due to calcified coronary lesion: Secondary | ICD-10-CM | POA: Diagnosis not present

## 2020-06-28 DIAGNOSIS — I129 Hypertensive chronic kidney disease with stage 1 through stage 4 chronic kidney disease, or unspecified chronic kidney disease: Secondary | ICD-10-CM | POA: Diagnosis not present

## 2020-06-28 DIAGNOSIS — L03119 Cellulitis of unspecified part of limb: Secondary | ICD-10-CM | POA: Diagnosis not present

## 2020-06-28 DIAGNOSIS — I872 Venous insufficiency (chronic) (peripheral): Secondary | ICD-10-CM | POA: Diagnosis not present

## 2020-06-28 DIAGNOSIS — E1165 Type 2 diabetes mellitus with hyperglycemia: Secondary | ICD-10-CM | POA: Diagnosis not present

## 2020-06-28 DIAGNOSIS — E1151 Type 2 diabetes mellitus with diabetic peripheral angiopathy without gangrene: Secondary | ICD-10-CM | POA: Diagnosis not present

## 2020-06-28 MED ORDER — FERROUS GLUCONATE 324 (38 FE) MG PO TABS: ORAL_TABLET | ORAL | 3 refills | Status: AC

## 2020-06-28 MED ORDER — POTASSIUM CHLORIDE ER 10 MEQ PO TBCR
EXTENDED_RELEASE_TABLET | ORAL | 3 refills | Status: DC
Start: 2020-06-28 — End: 2021-03-28

## 2020-06-28 MED ORDER — SIMVASTATIN 20 MG PO TABS: 20.0000 mg | ORAL_TABLET | Freq: Every day | ORAL | 3 refills | Status: AC

## 2020-06-28 MED ORDER — TORSEMIDE 20 MG PO TABS
ORAL_TABLET | ORAL | 3 refills | Status: DC
Start: 2020-06-28 — End: 2021-03-28

## 2020-06-28 NOTE — Telephone Encounter (Signed)
Patient does not have o2 device or bp cuff at home. She will have her caregiver check both readings tomorrow and call me with the numbers. She said she is okay.

## 2020-06-28 NOTE — Telephone Encounter (Signed)
PT from [facility] called to report bp of 90/60 and heart rate 120. He said she is resting and she looks ok.

## 2020-06-28 NOTE — Telephone Encounter (Signed)
[facility] Discharge Summary to [facility] per Dr Verlene Mayer request Phone - [phone] Fax [phone]

## 2020-06-28 NOTE — Patient Instructions (Addendum)
Patient has made appt at [facility] for vascular assessment. Labs drawn today as requested by Hospitalist. I have suggested patient see Dr. Denna Haggard, her dermatologist to see if LE dermatitis is a recurrence of pemphigus.She has follow up later this week with Dr. Chalmers Cater, Endocrinologist. She currently is receiving home health services. Your medicare wellness visit is due in November.  If you do not have pemphigus, I would recommend that she be seen at wound care center for leg dermatitis.

## 2020-06-28 NOTE — Telephone Encounter (Signed)
Please call patient and ask if she can take her BP at home? Her BP here today was OK but she did have elevated pulse which I contributed to walking in from car and being deconditioned. Is her pulse regular?

## 2020-06-28 NOTE — Progress Notes (Signed)
Subjective:    Patient ID: Julie Mora, female    DOB: 1941-08-08, 79 y.o.   MRN: [id number]  HPI 79 year old Female seen for hospitalization follow up.  Patient was admitted to the hospital September 3 through May 26, 2020.  She is here today for hospital follow-up.  Upon presentation to the emergency department heart rate was in the 120s, she was slightly tachypneic but not hypoxic or hypotensive.  Temperature was 101.3 degrees.  White blood cell count 8200.  COVID-19 test was negative.  Urinalysis was abnormal.  D-dimer and CRP slightly elevated.  Chest x-ray had patchy heterogeneous opacities in the periphery of the left lung and more mildly in the right lung bases.  Blood cultures were ordered and she was admitted for sepsis due to possible atypical pneumonia, UTI and bilateral lower extremity cellulitis.  Was started on ceftriaxone and azithromycin.  Doppler studies were negative.  Blood cultures were negative.  CT angio showed no evidence of pneumonia or PE.  Urine culture grew Enterococcus Michaelis.  Wound cultures were negative.  Patient was evaluated by wound care specialist who recommended outpatient follow-up with vein and vascular specialist.  She does have a history of pemphigus.  She was sent to a skilled nursing facility.  History of chronic kidney disease stage III a.  History of insulin-dependent diabetes mellitus with hemoglobin A1c 10.3% and is followed by Dr. Chalmers Cater.  Was discharged from [facility] on September 21.  Home health has been seeing her at home.  My concern today is lower extremity lesions some of which appear vesicular and are weeping.  They are wrapped by home health agency.  With her history of pemphigus, I would like for her to see  Dermatologist.  Dr. Denna Haggard took care of her previously for pemphigus.  Patient is interested in vein and vascular consultation as well which was recommended during her hospitalization.  In 2018 Dr. Alen Blew  treated her with Rituxan for pemphigus.  Prior to 2018 she had had an exacerbation of pemphigus 3 to 4 years earlier.  Had gallstone pancreatitis in September 2018 and underwent a laparoscopic cholecystectomy.  History of stage IIIa chronic kidney disease.  Social history: She resides alone.  She is a widow.  Does not consume alcohol.  Family history: Father died at age 48 reportedly of pancreatic cancer.  Mother died of complications of "old age and dementia.  1 sister.  No brothers.  History of stress and urge urinary incontinence and has to wear protective underwear.  It is difficult for her to obtain a clean-catch urine in this office and we frequently have to send a specimen cup home with her and have her bring it back.  She has been on chronic anticoagulation for history of left occipital parietal CVA in 2006 and history of DVT in 2018.  I do not see these medications upon discharge.  Patient asking to take Fosamax.  Last DEXA scan was in 2020.  T score in the femoral neck was -3.0.  Has seen urologist in the past for recurrent urinary infections.  Does have regular ophthalmology follow-up.  Ophthalmologist is through [facility].  She says she needs a corneal transplant of the right eye.    Review of Systems see above.  I am somewhat concerned that she resides alone but she seems to be getting along fairly well.     Objective:   Physical Exam Blood pressure 120/60 pulse 123.  She gets a  bit short of breath with ambulation.  Temperature 97.8 degrees pulse oximetry 95%.  Tachycardia may be due to deconditioning.  Skin warm and dry she has vesicular lesions on both lower extremities that are weeping.  Home health agency has been using Santyl on these.  Not sure if this represents stasis dermatitis or recurrence of pemphigus.  Would like Dr. Denna Haggard to see her.  Neck is supple without JVD thyromegaly or carotid bruits.  Chest clear.  Cardiac exam regular rate  and rhythm.  Tachycardia noted.  Affect, thought, and judgment appear to be normal.       Assessment & Plan:  Recent hospitalization for presumed sepsis with source more likely to have been from legs as pneumonia was ruled out with CT angio.  She was febrile at the time of admission  Insulin-dependent diabetes mellitus followed by Dr. Chalmers Cater, endocrinologist  History of pemphigus  Osteoporosis  Remote history of CVA-currently not on anticoagulation  Deconditioning  History of recurrent urinary tract infections-unable to get specimen today  Chronic kidney disease stage IIIa  Hyperlipidemia  Status post laparoscopic cholecystectomy  Stress and urge urinary incontinence  Plan: Would like for her Dermatologist to see her legs.  She may be best served by going to the wound care center if this is not pemphigus.  Patient has made her own appointment at [facility] for vascular assessment and is looking forward to going there.  Medicare wellness visit is due in November.

## 2020-06-29 ENCOUNTER — Telehealth: Payer: Self-pay | Admitting: Internal Medicine

## 2020-06-29 DIAGNOSIS — I2584 Coronary atherosclerosis due to calcified coronary lesion: Secondary | ICD-10-CM | POA: Diagnosis not present

## 2020-06-29 DIAGNOSIS — I890 Lymphedema, not elsewhere classified: Secondary | ICD-10-CM | POA: Diagnosis not present

## 2020-06-29 DIAGNOSIS — I129 Hypertensive chronic kidney disease with stage 1 through stage 4 chronic kidney disease, or unspecified chronic kidney disease: Secondary | ICD-10-CM | POA: Diagnosis not present

## 2020-06-29 DIAGNOSIS — I700 Atherosclerosis of aorta: Secondary | ICD-10-CM | POA: Diagnosis not present

## 2020-06-29 DIAGNOSIS — E1151 Type 2 diabetes mellitus with diabetic peripheral angiopathy without gangrene: Secondary | ICD-10-CM | POA: Diagnosis not present

## 2020-06-29 DIAGNOSIS — E1165 Type 2 diabetes mellitus with hyperglycemia: Secondary | ICD-10-CM | POA: Diagnosis not present

## 2020-06-29 DIAGNOSIS — I872 Venous insufficiency (chronic) (peripheral): Secondary | ICD-10-CM | POA: Diagnosis not present

## 2020-06-29 DIAGNOSIS — E1122 Type 2 diabetes mellitus with diabetic chronic kidney disease: Secondary | ICD-10-CM | POA: Diagnosis not present

## 2020-06-29 DIAGNOSIS — N1831 Chronic kidney disease, stage 3a: Secondary | ICD-10-CM | POA: Diagnosis not present

## 2020-06-29 LAB — COMPLETE METABOLIC PANEL WITH GFR
AG Ratio: 1 (calc) (ref 1.0–2.5)
ALT: 7 U/L (ref 6–29)
AST: 14 U/L (ref 10–35)
Albumin: 3.3 g/dL — ABNORMAL LOW (ref 3.6–5.1)
Alkaline phosphatase (APISO): 69 U/L (ref 37–153)
BUN/Creatinine Ratio: 21 (calc) (ref 6–22)
BUN: 24 mg/dL (ref 7–25)
CO2: 26 mmol/L (ref 20–32)
Calcium: 9.3 mg/dL (ref 8.6–10.4)
Chloride: 104 mmol/L (ref 98–110)
Creat: 1.17 mg/dL — ABNORMAL HIGH (ref 0.60–0.93)
GFR, Est African American: 52 mL/min/{1.73_m2} — ABNORMAL LOW (ref >=60)
GFR, Est Non African American: 45 mL/min/{1.73_m2} — ABNORMAL LOW (ref >=60)
Globulin: 3.3 g/dL (calc) (ref 1.9–3.7)
Glucose, Bld: 180 mg/dL — ABNORMAL HIGH (ref 65–99)
Potassium: 4.2 mmol/L (ref 3.5–5.3)
Sodium: 141 mmol/L (ref 135–146)
Total Bilirubin: 0.4 mg/dL (ref 0.2–1.2)
Total Protein: 6.6 g/dL (ref 6.1–8.1)

## 2020-06-29 LAB — CBC WITH DIFFERENTIAL/PLATELET
Absolute Monocytes: 717 cells/uL (ref 200–950)
Basophils Absolute: 40 cells/uL (ref 0–200)
Basophils Relative: 0.4 %
Eosinophils Absolute: 323 cells/uL (ref 15–500)
Eosinophils Relative: 3.2 %
HCT: 42.6 % (ref 35.0–45.0)
Hemoglobin: 13.6 g/dL (ref 11.7–15.5)
Lymphs Abs: 2353 cells/uL (ref 850–3900)
MCH: 30.8 pg (ref 27.0–33.0)
MCHC: 31.9 g/dL — ABNORMAL LOW (ref 32.0–36.0)
MCV: 96.6 fL (ref 80.0–100.0)
MPV: 11.3 fL (ref 7.5–12.5)
Monocytes Relative: 7.1 %
Neutro Abs: 6666 cells/uL (ref 1500–7800)
Neutrophils Relative %: 66 %
Platelets: 254 10*3/uL (ref 140–400)
RBC: 4.41 10*6/uL (ref 3.80–5.10)
RDW: 12.6 % (ref 11.0–15.0)
Total Lymphocyte: 23.3 %
WBC: 10.1 10*3/uL (ref 3.8–10.8)

## 2020-06-29 LAB — MAGNESIUM: Magnesium: 1.6 mg/dL (ref 1.5–2.5)

## 2020-06-29 NOTE — Telephone Encounter (Signed)
Spoke with Eustaquio Maize- tried to confirm our fax number but she is not in office. Have given parameters to call us for VS less than 072 systolic BP or  pule rate 120 or greater . Patient is deconditioned. A little afraid of using metoprolol due to possible hypotension.  Stressed to [employer] that it  is Important that patient see dermatologist,Dr. Denna Haggard, this week to see if leg dermatitis is pemphigus and to formulate therapy for leg dermatitis/cellulitis

## 2020-06-29 NOTE — Telephone Encounter (Signed)
[facility] [phone]  Beth called to say that patients Heart Rate is 112 today and it is their protocol to call if the patients heart rate is above 100 unless the doctor sets parameters above that. She also said that metoprolol is on patients list of medications but she states that she has never taking this medication.

## 2020-06-30 ENCOUNTER — Other Ambulatory Visit: Payer: Self-pay

## 2020-06-30 ENCOUNTER — Telehealth: Payer: Self-pay | Admitting: Internal Medicine

## 2020-06-30 DIAGNOSIS — I890 Lymphedema, not elsewhere classified: Secondary | ICD-10-CM | POA: Diagnosis not present

## 2020-06-30 DIAGNOSIS — I872 Venous insufficiency (chronic) (peripheral): Secondary | ICD-10-CM | POA: Diagnosis not present

## 2020-06-30 DIAGNOSIS — E1151 Type 2 diabetes mellitus with diabetic peripheral angiopathy without gangrene: Secondary | ICD-10-CM | POA: Diagnosis not present

## 2020-06-30 MED ORDER — ALENDRONATE SODIUM 70 MG PO TABS: 70.0000 mg | ORAL_TABLET | ORAL | 11 refills | Status: DC

## 2020-06-30 NOTE — Telephone Encounter (Addendum)
Received faxed note where Dr Renold Genta gave verbal order to Sharyon Cable, RN  Cover sheet stated for Review

## 2020-06-30 NOTE — Telephone Encounter (Signed)
Faxed 8 pages of Original Certified signed [facility] Orders to [facility] on 10/01/2019 at 12:45 PM that is good from 06/19/2020 to 08/17/2020   received orders at 1:28 am on 10/01/2019  Order # [id number]

## 2020-07-01 ENCOUNTER — Telehealth: Payer: Self-pay | Admitting: Internal Medicine

## 2020-07-01 ENCOUNTER — Telehealth: Payer: Self-pay

## 2020-07-01 DIAGNOSIS — I890 Lymphedema, not elsewhere classified: Secondary | ICD-10-CM | POA: Diagnosis not present

## 2020-07-01 NOTE — Telephone Encounter (Signed)
Faxed signed New Vital Parameters Orders dated 06/29/2020

## 2020-07-01 NOTE — Telephone Encounter (Signed)
Phone call to patient to offer her a office visit today, she preceded to say that she was seen by the vein specialist today and they have her in una boots and will change them every 3 days.

## 2020-07-01 NOTE — Telephone Encounter (Signed)
-----   Message from Lavonna Monarch, MD sent at 07/01/2020  1:01 PM EDT ----- Regarding: FW: Possible Pemphigus  ----- Message ----- From: Abran Cantor Sent: 06/30/2020   2:28 PM EDT To: Lavonna Monarch, MD, Elby Showers, MD Subject: Possible Pemphigus                             Dr Denna Haggard, This patient was recently in hospital and rehab, and is now being seen by [facility] at home and has sores on her legs. Dr Renold Genta ask patient to reach out to your office for patient to be seen by you that she is afraid patient might have Pemphigus, like she has had in past and the appointment she received is for 09/06/20. Is there any way that you could see patient before this appointment? Thank You  Yvonna Alanis

## 2020-07-02 DIAGNOSIS — N1831 Chronic kidney disease, stage 3a: Secondary | ICD-10-CM | POA: Diagnosis not present

## 2020-07-02 DIAGNOSIS — I890 Lymphedema, not elsewhere classified: Secondary | ICD-10-CM | POA: Diagnosis not present

## 2020-07-02 DIAGNOSIS — K7689 Other specified diseases of liver: Secondary | ICD-10-CM | POA: Diagnosis not present

## 2020-07-02 DIAGNOSIS — E1122 Type 2 diabetes mellitus with diabetic chronic kidney disease: Secondary | ICD-10-CM | POA: Diagnosis not present

## 2020-07-02 DIAGNOSIS — E1151 Type 2 diabetes mellitus with diabetic peripheral angiopathy without gangrene: Secondary | ICD-10-CM | POA: Diagnosis not present

## 2020-07-02 DIAGNOSIS — I2584 Coronary atherosclerosis due to calcified coronary lesion: Secondary | ICD-10-CM | POA: Diagnosis not present

## 2020-07-02 DIAGNOSIS — I700 Atherosclerosis of aorta: Secondary | ICD-10-CM | POA: Diagnosis not present

## 2020-07-02 DIAGNOSIS — I129 Hypertensive chronic kidney disease with stage 1 through stage 4 chronic kidney disease, or unspecified chronic kidney disease: Secondary | ICD-10-CM | POA: Diagnosis not present

## 2020-07-02 DIAGNOSIS — I10 Essential (primary) hypertension: Secondary | ICD-10-CM | POA: Diagnosis not present

## 2020-07-02 DIAGNOSIS — I872 Venous insufficiency (chronic) (peripheral): Secondary | ICD-10-CM | POA: Diagnosis not present

## 2020-07-02 DIAGNOSIS — E1165 Type 2 diabetes mellitus with hyperglycemia: Secondary | ICD-10-CM | POA: Diagnosis not present

## 2020-07-05 DIAGNOSIS — L97211 Non-pressure chronic ulcer of right calf limited to breakdown of skin: Secondary | ICD-10-CM | POA: Diagnosis not present

## 2020-07-05 DIAGNOSIS — L97221 Non-pressure chronic ulcer of left calf limited to breakdown of skin: Secondary | ICD-10-CM | POA: Diagnosis not present

## 2020-07-06 DIAGNOSIS — E1151 Type 2 diabetes mellitus with diabetic peripheral angiopathy without gangrene: Secondary | ICD-10-CM | POA: Diagnosis not present

## 2020-07-06 DIAGNOSIS — I872 Venous insufficiency (chronic) (peripheral): Secondary | ICD-10-CM | POA: Diagnosis not present

## 2020-07-06 DIAGNOSIS — E1122 Type 2 diabetes mellitus with diabetic chronic kidney disease: Secondary | ICD-10-CM | POA: Diagnosis not present

## 2020-07-06 DIAGNOSIS — I700 Atherosclerosis of aorta: Secondary | ICD-10-CM | POA: Diagnosis not present

## 2020-07-06 DIAGNOSIS — I129 Hypertensive chronic kidney disease with stage 1 through stage 4 chronic kidney disease, or unspecified chronic kidney disease: Secondary | ICD-10-CM | POA: Diagnosis not present

## 2020-07-06 DIAGNOSIS — I890 Lymphedema, not elsewhere classified: Secondary | ICD-10-CM | POA: Diagnosis not present

## 2020-07-06 DIAGNOSIS — N1831 Chronic kidney disease, stage 3a: Secondary | ICD-10-CM | POA: Diagnosis not present

## 2020-07-06 DIAGNOSIS — I2584 Coronary atherosclerosis due to calcified coronary lesion: Secondary | ICD-10-CM | POA: Diagnosis not present

## 2020-07-06 DIAGNOSIS — E1165 Type 2 diabetes mellitus with hyperglycemia: Secondary | ICD-10-CM | POA: Diagnosis not present

## 2020-07-08 DIAGNOSIS — L97211 Non-pressure chronic ulcer of right calf limited to breakdown of skin: Secondary | ICD-10-CM | POA: Diagnosis not present

## 2020-07-08 DIAGNOSIS — L97221 Non-pressure chronic ulcer of left calf limited to breakdown of skin: Secondary | ICD-10-CM | POA: Diagnosis not present

## 2020-07-09 DIAGNOSIS — N1831 Chronic kidney disease, stage 3a: Secondary | ICD-10-CM | POA: Diagnosis not present

## 2020-07-09 DIAGNOSIS — I2584 Coronary atherosclerosis due to calcified coronary lesion: Secondary | ICD-10-CM | POA: Diagnosis not present

## 2020-07-09 DIAGNOSIS — I700 Atherosclerosis of aorta: Secondary | ICD-10-CM | POA: Diagnosis not present

## 2020-07-09 DIAGNOSIS — E1122 Type 2 diabetes mellitus with diabetic chronic kidney disease: Secondary | ICD-10-CM | POA: Diagnosis not present

## 2020-07-09 DIAGNOSIS — E1151 Type 2 diabetes mellitus with diabetic peripheral angiopathy without gangrene: Secondary | ICD-10-CM | POA: Diagnosis not present

## 2020-07-09 DIAGNOSIS — E1165 Type 2 diabetes mellitus with hyperglycemia: Secondary | ICD-10-CM | POA: Diagnosis not present

## 2020-07-09 DIAGNOSIS — I129 Hypertensive chronic kidney disease with stage 1 through stage 4 chronic kidney disease, or unspecified chronic kidney disease: Secondary | ICD-10-CM | POA: Diagnosis not present

## 2020-07-09 DIAGNOSIS — I890 Lymphedema, not elsewhere classified: Secondary | ICD-10-CM | POA: Diagnosis not present

## 2020-07-09 DIAGNOSIS — I872 Venous insufficiency (chronic) (peripheral): Secondary | ICD-10-CM | POA: Diagnosis not present

## 2020-07-12 ENCOUNTER — Telehealth: Payer: Self-pay | Admitting: Internal Medicine

## 2020-07-12 NOTE — Telephone Encounter (Signed)
Ameila Weldon [phone]  Julie Mora called to say she fell over the weekend, she was using her walking Rolator and her knee bulked under her and then the other knee buckled this is second time this has happened. She went down on her butt, did not hit head, she had to call EMS to help her up. She would like to know who would be good ortho doctor. The only one she can think of is who her mom went to and that is Dr Mayer Camel.

## 2020-07-12 NOTE — Telephone Encounter (Signed)
Called and gave patient Dr Damita Dunnings phone number

## 2020-07-12 NOTE — Telephone Encounter (Signed)
Dr. Mayer Camel is excellent. She can call and get her own appt.

## 2020-07-14 DIAGNOSIS — E1165 Type 2 diabetes mellitus with hyperglycemia: Secondary | ICD-10-CM | POA: Diagnosis not present

## 2020-07-14 DIAGNOSIS — E1122 Type 2 diabetes mellitus with diabetic chronic kidney disease: Secondary | ICD-10-CM | POA: Diagnosis not present

## 2020-07-14 DIAGNOSIS — I2584 Coronary atherosclerosis due to calcified coronary lesion: Secondary | ICD-10-CM | POA: Diagnosis not present

## 2020-07-14 DIAGNOSIS — E1151 Type 2 diabetes mellitus with diabetic peripheral angiopathy without gangrene: Secondary | ICD-10-CM | POA: Diagnosis not present

## 2020-07-14 DIAGNOSIS — I129 Hypertensive chronic kidney disease with stage 1 through stage 4 chronic kidney disease, or unspecified chronic kidney disease: Secondary | ICD-10-CM | POA: Diagnosis not present

## 2020-07-14 DIAGNOSIS — I700 Atherosclerosis of aorta: Secondary | ICD-10-CM | POA: Diagnosis not present

## 2020-07-14 DIAGNOSIS — I890 Lymphedema, not elsewhere classified: Secondary | ICD-10-CM | POA: Diagnosis not present

## 2020-07-14 DIAGNOSIS — I872 Venous insufficiency (chronic) (peripheral): Secondary | ICD-10-CM | POA: Diagnosis not present

## 2020-07-14 DIAGNOSIS — N1831 Chronic kidney disease, stage 3a: Secondary | ICD-10-CM | POA: Diagnosis not present

## 2020-07-16 DIAGNOSIS — L97221 Non-pressure chronic ulcer of left calf limited to breakdown of skin: Secondary | ICD-10-CM | POA: Diagnosis not present

## 2020-07-16 DIAGNOSIS — L97211 Non-pressure chronic ulcer of right calf limited to breakdown of skin: Secondary | ICD-10-CM | POA: Diagnosis not present

## 2020-07-19 ENCOUNTER — Telehealth: Payer: Self-pay

## 2020-07-19 DIAGNOSIS — I700 Atherosclerosis of aorta: Secondary | ICD-10-CM | POA: Diagnosis not present

## 2020-07-19 DIAGNOSIS — M25561 Pain in right knee: Secondary | ICD-10-CM | POA: Diagnosis not present

## 2020-07-19 DIAGNOSIS — I872 Venous insufficiency (chronic) (peripheral): Secondary | ICD-10-CM | POA: Diagnosis not present

## 2020-07-19 DIAGNOSIS — E11319 Type 2 diabetes mellitus with unspecified diabetic retinopathy without macular edema: Secondary | ICD-10-CM | POA: Diagnosis not present

## 2020-07-19 DIAGNOSIS — I129 Hypertensive chronic kidney disease with stage 1 through stage 4 chronic kidney disease, or unspecified chronic kidney disease: Secondary | ICD-10-CM | POA: Diagnosis not present

## 2020-07-19 DIAGNOSIS — M810 Age-related osteoporosis without current pathological fracture: Secondary | ICD-10-CM | POA: Diagnosis not present

## 2020-07-19 DIAGNOSIS — E1151 Type 2 diabetes mellitus with diabetic peripheral angiopathy without gangrene: Secondary | ICD-10-CM | POA: Diagnosis not present

## 2020-07-19 DIAGNOSIS — E1165 Type 2 diabetes mellitus with hyperglycemia: Secondary | ICD-10-CM | POA: Diagnosis not present

## 2020-07-19 DIAGNOSIS — M25551 Pain in right hip: Secondary | ICD-10-CM | POA: Diagnosis not present

## 2020-07-19 DIAGNOSIS — I10 Essential (primary) hypertension: Secondary | ICD-10-CM | POA: Diagnosis not present

## 2020-07-19 DIAGNOSIS — N1831 Chronic kidney disease, stage 3a: Secondary | ICD-10-CM | POA: Diagnosis not present

## 2020-07-19 DIAGNOSIS — I2584 Coronary atherosclerosis due to calcified coronary lesion: Secondary | ICD-10-CM | POA: Diagnosis not present

## 2020-07-19 DIAGNOSIS — R2689 Other abnormalities of gait and mobility: Secondary | ICD-10-CM | POA: Diagnosis not present

## 2020-07-19 DIAGNOSIS — I890 Lymphedema, not elsewhere classified: Secondary | ICD-10-CM | POA: Diagnosis not present

## 2020-07-19 DIAGNOSIS — G609 Hereditary and idiopathic neuropathy, unspecified: Secondary | ICD-10-CM | POA: Diagnosis not present

## 2020-07-19 DIAGNOSIS — E1122 Type 2 diabetes mellitus with diabetic chronic kidney disease: Secondary | ICD-10-CM | POA: Diagnosis not present

## 2020-07-19 DIAGNOSIS — K7689 Other specified diseases of liver: Secondary | ICD-10-CM | POA: Diagnosis not present

## 2020-07-19 DIAGNOSIS — L109 Pemphigus, unspecified: Secondary | ICD-10-CM | POA: Diagnosis not present

## 2020-07-20 DIAGNOSIS — E1151 Type 2 diabetes mellitus with diabetic peripheral angiopathy without gangrene: Secondary | ICD-10-CM | POA: Diagnosis not present

## 2020-07-20 DIAGNOSIS — E1122 Type 2 diabetes mellitus with diabetic chronic kidney disease: Secondary | ICD-10-CM | POA: Diagnosis not present

## 2020-07-20 DIAGNOSIS — I700 Atherosclerosis of aorta: Secondary | ICD-10-CM | POA: Diagnosis not present

## 2020-07-20 DIAGNOSIS — I890 Lymphedema, not elsewhere classified: Secondary | ICD-10-CM | POA: Diagnosis not present

## 2020-07-20 DIAGNOSIS — N1831 Chronic kidney disease, stage 3a: Secondary | ICD-10-CM | POA: Diagnosis not present

## 2020-07-20 DIAGNOSIS — I2584 Coronary atherosclerosis due to calcified coronary lesion: Secondary | ICD-10-CM | POA: Diagnosis not present

## 2020-07-20 DIAGNOSIS — I872 Venous insufficiency (chronic) (peripheral): Secondary | ICD-10-CM | POA: Diagnosis not present

## 2020-07-20 DIAGNOSIS — I129 Hypertensive chronic kidney disease with stage 1 through stage 4 chronic kidney disease, or unspecified chronic kidney disease: Secondary | ICD-10-CM | POA: Diagnosis not present

## 2020-07-20 DIAGNOSIS — E1165 Type 2 diabetes mellitus with hyperglycemia: Secondary | ICD-10-CM | POA: Diagnosis not present

## 2020-07-21 DIAGNOSIS — I129 Hypertensive chronic kidney disease with stage 1 through stage 4 chronic kidney disease, or unspecified chronic kidney disease: Secondary | ICD-10-CM | POA: Diagnosis not present

## 2020-07-21 DIAGNOSIS — E1165 Type 2 diabetes mellitus with hyperglycemia: Secondary | ICD-10-CM | POA: Diagnosis not present

## 2020-07-21 DIAGNOSIS — H18002 Unspecified corneal deposit, left eye: Secondary | ICD-10-CM | POA: Diagnosis not present

## 2020-07-21 DIAGNOSIS — H04123 Dry eye syndrome of bilateral lacrimal glands: Secondary | ICD-10-CM | POA: Diagnosis not present

## 2020-07-21 DIAGNOSIS — I700 Atherosclerosis of aorta: Secondary | ICD-10-CM | POA: Diagnosis not present

## 2020-07-21 DIAGNOSIS — Z961 Presence of intraocular lens: Secondary | ICD-10-CM | POA: Diagnosis not present

## 2020-07-21 DIAGNOSIS — H169 Unspecified keratitis: Secondary | ICD-10-CM | POA: Diagnosis not present

## 2020-07-21 DIAGNOSIS — N1831 Chronic kidney disease, stage 3a: Secondary | ICD-10-CM | POA: Diagnosis not present

## 2020-07-21 DIAGNOSIS — E1151 Type 2 diabetes mellitus with diabetic peripheral angiopathy without gangrene: Secondary | ICD-10-CM | POA: Diagnosis not present

## 2020-07-21 DIAGNOSIS — E1122 Type 2 diabetes mellitus with diabetic chronic kidney disease: Secondary | ICD-10-CM | POA: Diagnosis not present

## 2020-07-21 DIAGNOSIS — I2584 Coronary atherosclerosis due to calcified coronary lesion: Secondary | ICD-10-CM | POA: Diagnosis not present

## 2020-07-21 DIAGNOSIS — I890 Lymphedema, not elsewhere classified: Secondary | ICD-10-CM | POA: Diagnosis not present

## 2020-07-21 DIAGNOSIS — I872 Venous insufficiency (chronic) (peripheral): Secondary | ICD-10-CM | POA: Diagnosis not present

## 2020-07-22 DIAGNOSIS — I2584 Coronary atherosclerosis due to calcified coronary lesion: Secondary | ICD-10-CM | POA: Diagnosis not present

## 2020-07-22 DIAGNOSIS — E1122 Type 2 diabetes mellitus with diabetic chronic kidney disease: Secondary | ICD-10-CM | POA: Diagnosis not present

## 2020-07-22 DIAGNOSIS — E1165 Type 2 diabetes mellitus with hyperglycemia: Secondary | ICD-10-CM | POA: Diagnosis not present

## 2020-07-22 DIAGNOSIS — I129 Hypertensive chronic kidney disease with stage 1 through stage 4 chronic kidney disease, or unspecified chronic kidney disease: Secondary | ICD-10-CM | POA: Diagnosis not present

## 2020-07-22 DIAGNOSIS — N1831 Chronic kidney disease, stage 3a: Secondary | ICD-10-CM | POA: Diagnosis not present

## 2020-07-22 DIAGNOSIS — L97211 Non-pressure chronic ulcer of right calf limited to breakdown of skin: Secondary | ICD-10-CM | POA: Diagnosis not present

## 2020-07-22 DIAGNOSIS — I700 Atherosclerosis of aorta: Secondary | ICD-10-CM | POA: Diagnosis not present

## 2020-07-22 DIAGNOSIS — L97221 Non-pressure chronic ulcer of left calf limited to breakdown of skin: Secondary | ICD-10-CM | POA: Diagnosis not present

## 2020-07-22 DIAGNOSIS — I890 Lymphedema, not elsewhere classified: Secondary | ICD-10-CM | POA: Diagnosis not present

## 2020-07-22 DIAGNOSIS — E1151 Type 2 diabetes mellitus with diabetic peripheral angiopathy without gangrene: Secondary | ICD-10-CM | POA: Diagnosis not present

## 2020-07-22 DIAGNOSIS — I872 Venous insufficiency (chronic) (peripheral): Secondary | ICD-10-CM | POA: Diagnosis not present

## 2020-07-27 ENCOUNTER — Telehealth: Payer: Self-pay | Admitting: Internal Medicine

## 2020-07-27 DIAGNOSIS — E1122 Type 2 diabetes mellitus with diabetic chronic kidney disease: Secondary | ICD-10-CM | POA: Diagnosis not present

## 2020-07-27 DIAGNOSIS — E1165 Type 2 diabetes mellitus with hyperglycemia: Secondary | ICD-10-CM | POA: Diagnosis not present

## 2020-07-27 DIAGNOSIS — N1831 Chronic kidney disease, stage 3a: Secondary | ICD-10-CM | POA: Diagnosis not present

## 2020-07-27 DIAGNOSIS — I700 Atherosclerosis of aorta: Secondary | ICD-10-CM | POA: Diagnosis not present

## 2020-07-27 DIAGNOSIS — I2584 Coronary atherosclerosis due to calcified coronary lesion: Secondary | ICD-10-CM | POA: Diagnosis not present

## 2020-07-27 DIAGNOSIS — E1151 Type 2 diabetes mellitus with diabetic peripheral angiopathy without gangrene: Secondary | ICD-10-CM | POA: Diagnosis not present

## 2020-07-27 DIAGNOSIS — I890 Lymphedema, not elsewhere classified: Secondary | ICD-10-CM | POA: Diagnosis not present

## 2020-07-27 DIAGNOSIS — I872 Venous insufficiency (chronic) (peripheral): Secondary | ICD-10-CM | POA: Diagnosis not present

## 2020-07-27 DIAGNOSIS — I129 Hypertensive chronic kidney disease with stage 1 through stage 4 chronic kidney disease, or unspecified chronic kidney disease: Secondary | ICD-10-CM | POA: Diagnosis not present

## 2020-07-27 NOTE — Telephone Encounter (Signed)
Beth RN from [employer] called to say that yesterday patient was setting on the edge of her recliner and it turned over on top of her. She had Life Alert so they contacted EMS, they come and got the chair off of her. She was not hurt. She had just fell forward in the floor. Vitals was good.

## 2020-07-30 ENCOUNTER — Encounter (HOSPITAL_COMMUNITY): Payer: Self-pay

## 2020-07-30 ENCOUNTER — Emergency Department (HOSPITAL_COMMUNITY): Payer: Medicare PPO

## 2020-07-30 ENCOUNTER — Other Ambulatory Visit: Payer: Self-pay

## 2020-07-30 ENCOUNTER — Emergency Department (HOSPITAL_COMMUNITY)
Admission: EM | Admit: 2020-07-30 | Discharge: 2020-07-30 | Disposition: A | Discharge status: Home / Self Care | Payer: Medicare PPO | Attending: Emergency Medicine | Admitting: Emergency Medicine

## 2020-07-30 DIAGNOSIS — Z79899 Other long term (current) drug therapy: Secondary | ICD-10-CM | POA: Diagnosis not present

## 2020-07-30 DIAGNOSIS — R58 Hemorrhage, not elsewhere classified: Secondary | ICD-10-CM | POA: Diagnosis not present

## 2020-07-30 DIAGNOSIS — W1839XA Other fall on same level, initial encounter: Secondary | ICD-10-CM | POA: Insufficient documentation

## 2020-07-30 DIAGNOSIS — E119 Type 2 diabetes mellitus without complications: Secondary | ICD-10-CM | POA: Diagnosis not present

## 2020-07-30 DIAGNOSIS — Z794 Long term (current) use of insulin: Secondary | ICD-10-CM | POA: Insufficient documentation

## 2020-07-30 DIAGNOSIS — R52 Pain, unspecified: Secondary | ICD-10-CM | POA: Diagnosis not present

## 2020-07-30 DIAGNOSIS — W19XXXA Unspecified fall, initial encounter: Secondary | ICD-10-CM | POA: Diagnosis not present

## 2020-07-30 DIAGNOSIS — S81802A Unspecified open wound, left lower leg, initial encounter: Secondary | ICD-10-CM

## 2020-07-30 DIAGNOSIS — R279 Unspecified lack of coordination: Secondary | ICD-10-CM | POA: Diagnosis not present

## 2020-07-30 DIAGNOSIS — S80919A Unspecified superficial injury of unspecified knee, initial encounter: Secondary | ICD-10-CM | POA: Diagnosis not present

## 2020-07-30 DIAGNOSIS — I10 Essential (primary) hypertension: Secondary | ICD-10-CM | POA: Diagnosis not present

## 2020-07-30 DIAGNOSIS — Z743 Need for continuous supervision: Secondary | ICD-10-CM | POA: Diagnosis not present

## 2020-07-30 DIAGNOSIS — R531 Weakness: Secondary | ICD-10-CM | POA: Diagnosis not present

## 2020-07-30 NOTE — ED Notes (Signed)
Tegaderm and ace wrap applied to left lower leg by PA

## 2020-07-30 NOTE — ED Notes (Signed)
dermaclips are unable to stick to skin. Wound nurse consulted

## 2020-07-30 NOTE — ED Triage Notes (Signed)
Loss of balance witnessed fall. -loc, - blood thinners. Denies hitting head. Skin tear to left lower leg from fall. A&O x4

## 2020-07-30 NOTE — ED Notes (Signed)
PA provided dermaclips

## 2020-07-30 NOTE — ED Provider Notes (Signed)
[facility] DEPT Provider Note   CSN: [id number] Arrival date & time: 07/30/20  0847     History Chief Complaint  Patient presents with  . Fall    Julie Mora is a 79 y.o. female.  79 year old female brought in by EMS from home for left leg wound. Patient reports history of lymphedema, missed her appointment yesterday to have her legs wrapped.  Patient was ambulating from the bathroom down her hallway when her legs became weak and she fell resulting in a wound to the left lower leg.  Patient states that her legs have become progressively more weak over the past 6 months, reports frequent falls however states that she would not have come in today if not for the wound on her leg.  Patient denies feeling dizzy or lightheaded, syncopal, denies hitting her head or loss of consciousness.  Denies pain in her neck, back, extremities.  No other complaints or concerns today.  Patient lives at home under the care of her sister and home health aides.  Patient feels safe at home and plans to return home today.        Past Medical History:  Diagnosis Date  . Anemia   . Essential hypertension, benign   . Incontinence   . Other and unspecified hyperlipidemia   . Pemphigus foliaceous   . Renal insufficiency   . Type II or unspecified type diabetes mellitus without mention of complication, not stated as uncontrolled   . Vitamin D deficiency     Patient Active Problem List   Diagnosis Date Noted  . Pneumonia 05/22/2020  . Cellulitis 05/22/2020  . Fall at home, initial encounter 05/22/2020  . Paronychia of right middle finger 10/23/2018  . Primary osteoarthritis of both first carpometacarpal joints 10/23/2018  . Spasms of the hands or feet 10/23/2018  . Pancreatic cyst 06/07/2017  . UTI (urinary tract infection) 06/07/2017  . Morbid obesity ([place]) 03/02/2017  . Poorly controlled diabetes mellitus ([place]) 03/02/2017  . Stasis dermatitis of both legs 03/02/2017   . Sepsis ([place]) 01/19/2017  . Lymphedema of both lower extremities 06/21/2015  . Venous insufficiency (chronic) (peripheral) 06/21/2015  . Osteoporosis 12/15/2014  . PCO (posterior capsular opacification) 10/15/2013  . Keratitis 08/20/2013  . Corneal scarring 03/03/2013  . Metabolic syndrome 14/97/0263  . History of stroke 05/03/2012  . Ectropion 10/10/2011  . 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 Pseudophakia 10/10/2011  . History of deep venous thrombosis 04/19/2011  . Vitamin D deficiency 04/19/2011  . History of iron deficiency 04/19/2011  . Dependent edema 04/19/2011  . Mixed stress and urge urinary incontinence 04/19/2011  . CEREBROVASCULAR ACCIDENT, ACUTE 06/25/2009  . PEMPHIGUS FOLIACEOUS 06/25/2009  . Diabetes mellitus ([place]) 05/28/2009  . HYPERCHOLESTEROLEMIA 05/28/2009  . Essential hypertension 05/28/2009    Past Surgical History:  Procedure Laterality Date  . CHOLECYSTECTOMY N/A 06/08/2017   Procedure: LAPAROSCOPIC CHOLECYSTECTOMY;  Surgeon: Kinsinger, Arta Bruce, MD;  Location: [facility] ORS;  Service: General;  Laterality: N/A;     OB History   No obstetric history on file.     Family History  Problem Relation Age of Onset  . Pancreatic cancer Father 8  . Dementia Mother 28    Social History   Tobacco Use  . Smoking status: Never Smoker  . Smokeless tobacco: Never Used  Vaping Use  . Vaping Use: Never used  Substance Use Topics  . Alcohol use: No  . Drug use: No    Home Medications  Prior to Admission medications   Medication Sig Start Date End Date Taking? Authorizing Provider  ACCU-CHEK AVIVA PLUS test strip USE TO CHECK BLOOD SUGAR BEFORE BREAKFAST AND SUPPER 11/09/14   Elby Showers, MD  acetaminophen (TYLENOL) 500 MG tablet Take 500 mg by mouth every 6 (six) hours as needed for pain.    [provider]  alendronate (FOSAMAX) 70 MG tablet Take 1 tablet (70 mg total) by mouth every 7 (seven) days. Take with a full  glass of water on an empty stomach. 06/30/20   Elby Showers, MD  BD PEN NEEDLE NANO U/F 32G X 4 MM MISC USE AS DIRECTED 01/11/15   Elby Showers, MD  collagenase (SANTYL) ointment Apply topically daily. To both legs 05/26/20   Mercy Riding, MD  ferrous gluconate (FERGON) 324 MG tablet TAKE 1 TABLET(324 MG) BY MOUTH DAILY WITH BREAKFAST 06/28/20   Elby Showers, MD  gabapentin (NEURONTIN) 100 MG capsule Take 100 mg by mouth at bedtime.  07/09/19   [provider]  insulin aspart (NOVOLOG) 100 UNIT/ML injection 0-15 Units, Subcutaneous, 3 times daily with meals, First dose on Sat 05/22/20 at 1700 Correction coverage: Moderate (average weight, post-op) CBG < 70: Implement Hypoglycemia Standing Orders and refer to Hypoglycemia Standing Orders sidebar report CBG 70 - 120: 0 units CBG 121 - 150: 2 units CBG 151 - 200: 3 units CBG 201 - 250: 5 units CBG 251 - 300: 8 units CBG 301 - 350: 11 units CBG 351 - 400: 15 units CBG > 400: call MD 05/26/20   Mercy Riding, MD  insulin glargine (LANTUS SOLOSTAR) 100 UNIT/ML Solostar Pen Inject 20 Units into the skin daily. 05/26/20   Mercy Riding, MD  loperamide (IMODIUM) 2 MG capsule Take 1 capsule (2 mg total) by mouth as needed for diarrhea or loose stools. 05/26/20   Mercy Riding, MD  metoprolol tartrate (LOPRESSOR) 25 MG tablet Take 0.5 tablets (12.5 mg total) by mouth 2 (two) times daily. Patient not taking: Reported on 06/28/2020 05/26/20   Mercy Riding, MD  Polyethyl Glycol-Propyl Glycol (SYSTANE FREE OP) Apply to eye daily.    [provider]  potassium chloride (KLOR-CON) 10 MEQ tablet TAKE 1 TABLET(10 MEQ) BY MOUTH DAILY 06/28/20   Elby Showers, MD  prednisoLONE acetate (PRED FORTE) 1 % ophthalmic suspension Place 1 drop into both eyes in the morning and at bedtime.    [provider]  simvastatin (ZOCOR) 20 MG tablet Take 1 tablet (20 mg total) by mouth at bedtime. 06/28/20   Elby Showers, MD  torsemide (DEMADEX) 20 MG  tablet TAKE 1 TABLET(20 MG) BY MOUTH DAILY 06/28/20   Elby Showers, MD  triamcinolone cream (KENALOG) 0.1 % Apply 1 application topically daily.    [provider]    Allergies    Azathioprine, Cellcept [mycophenolate mofetil], and Ramipril  Review of Systems   Review of Systems  Constitutional: Negative for chills and fever.  Respiratory: Negative for shortness of breath.   Cardiovascular: Negative for chest pain.  Musculoskeletal: Negative for arthralgias and myalgias.  Skin: Positive for wound.  Allergic/Immunologic: Positive for immunocompromised state.  Neurological: Positive for weakness. Negative for dizziness, light-headedness and numbness.  Hematological: Does not bruise/bleed easily.  Psychiatric/Behavioral: Negative for confusion.  All other systems reviewed and are negative.   Physical Exam Updated Vital Signs BP 103/85   Pulse (!) 117   Temp 98 F (36.7 C)  Resp 17   Ht 5\' 2"  (1.575 m)   Wt 115 kg   LMP  (LMP Unknown)   SpO2 100%   BMI 46.37 kg/m   Physical Exam Vitals and nursing note reviewed.  Constitutional:      General: She is not in acute distress.    Appearance: She is well-developed. She is obese. She is not diaphoretic.  HENT:     Head: Normocephalic and atraumatic.  Cardiovascular:     Rate and Rhythm: Normal rate and regular rhythm.     Pulses: Normal pulses.     Heart sounds: Normal heart sounds.  Pulmonary:     Effort: Pulmonary effort is normal.  Musculoskeletal:        General: No tenderness or deformity.     Right lower leg: Edema present.     Left lower leg: Edema present.     Comments: Lymphedema to bilateral lower extremities.  Large U-shaped wound to left lower leg measuring 8 cm down, 6 cm across, 7 cm up, mild oozing.  No pain with range of motion of the extremities.  Skin:    General: Skin is warm and dry.  Neurological:     Mental Status: She is alert and oriented to person, place, and time.  Psychiatric:         Behavior: Behavior normal.     ED Results / Procedures / Treatments   Labs (all labs ordered are listed, but only abnormal results are displayed) Labs Reviewed - No data to display  EKG None  Radiology DG Tibia/Fibula Left  Result Date: 07/30/2020 CLINICAL DATA:  Fall.  Anterior tibial wound EXAM: LEFT TIBIA AND FIBULA - 2 VIEW COMPARISON:  None. FINDINGS: Diffuse subcutaneous reticulation. Wound with bandage to the shin. Accounting for calcifications there is no opaque foreign body. No fracture or subluxation. Generalized osteopenia. IMPRESSION: Shin wound without fracture or opaque foreign body. Electronically Signed   By: Monte Fantasia M.D.   On: 07/30/2020 10:01    Procedures Procedures (including critical care time)  Medications Ordered in ED Medications - No data to display  ED Course  I have reviewed the triage vital signs and the nursing notes.  Pertinent labs & imaging results that were available during my care of the patient were reviewed by me and considered in my medical decision making (see chart for details).  Clinical Course as of Jul 31 1347  Fri Jul 30, 3256  3435 79 year old female presents from home for a deep skin tear/avulsion to her left anterior lower leg after a fall today as detailed above.  Patient is not anticoagulated.  X-ray is negative for acute bony injury or retained foreign body. Wound was evaluated, irrigated.  Concern for patient's lymphedema status that sutures may cause additional tension on her already very swollen skin.  Attempted to apply derma clip however despite use of benzoin, unable to adhere to patient's skin secondary complications from her lymphedema. Bleeding is controlled, continues to have mild serous drainage.  Large Tegaderm applied to the area with Ace wrap for gentle compression.  Advised patient to elevate leg is much as possible to help with the swelling.  Plan is to follow-up with her wound care provider on Monday (sees  lymphedema clinic for leg wrapping, given ambulatory referral for additional wound care if needed/unable to be cared for by lymphedema clinic).   [LM]    Clinical Course User Index [LM] Roque Lias   MDM Rules/Calculators/A&P  Final Clinical Impression(s) / ED Diagnoses Final diagnoses:  Fall, initial encounter  Wound of left lower extremity, initial encounter    Rx / DC Orders ED Discharge Orders         Ordered    Ambulatory referral to Wound Clinic       Comments: Left lower leg wound (deep avulsion) complicated by lymphedema   07/30/20 1258           Tacy Learn, PA-C 07/30/20 1348    Dorie Rank, MD 07/31/20 [phone]

## 2020-07-30 NOTE — ED Notes (Signed)
PA in room at this time

## 2020-07-30 NOTE — ED Notes (Signed)
Bed alarm placed by NT

## 2020-07-30 NOTE — ED Notes (Signed)
Called and requested transport back home.

## 2020-07-30 NOTE — ED Notes (Signed)
XRAY in room at this time

## 2020-07-30 NOTE — Discharge Instructions (Addendum)
Follow-up with your provider as scheduled on Monday. Follow-up with wound care as needed, referral given. Elevate leg to help with swelling. Leave dressing in place if possible until your follow-up on Monday.  If your dressing falls off, a new dressing may be applied as demonstrated.

## 2020-08-02 DIAGNOSIS — L97221 Non-pressure chronic ulcer of left calf limited to breakdown of skin: Secondary | ICD-10-CM | POA: Diagnosis not present

## 2020-08-02 DIAGNOSIS — L97211 Non-pressure chronic ulcer of right calf limited to breakdown of skin: Secondary | ICD-10-CM | POA: Diagnosis not present

## 2020-08-03 ENCOUNTER — Ambulatory Visit (INDEPENDENT_AMBULATORY_CARE_PROVIDER_SITE_OTHER): Payer: Medicare PPO | Admitting: Internal Medicine

## 2020-08-03 ENCOUNTER — Other Ambulatory Visit: Payer: Self-pay

## 2020-08-03 ENCOUNTER — Encounter: Payer: Self-pay | Admitting: Internal Medicine

## 2020-08-03 ENCOUNTER — Telehealth: Payer: Self-pay | Admitting: Internal Medicine

## 2020-08-03 DIAGNOSIS — N1831 Chronic kidney disease, stage 3a: Secondary | ICD-10-CM | POA: Diagnosis not present

## 2020-08-03 DIAGNOSIS — I700 Atherosclerosis of aorta: Secondary | ICD-10-CM | POA: Diagnosis not present

## 2020-08-03 DIAGNOSIS — I890 Lymphedema, not elsewhere classified: Secondary | ICD-10-CM | POA: Diagnosis not present

## 2020-08-03 DIAGNOSIS — S81802D Unspecified open wound, left lower leg, subsequent encounter: Secondary | ICD-10-CM

## 2020-08-03 DIAGNOSIS — E1151 Type 2 diabetes mellitus with diabetic peripheral angiopathy without gangrene: Secondary | ICD-10-CM | POA: Diagnosis not present

## 2020-08-03 DIAGNOSIS — E1169 Type 2 diabetes mellitus with other specified complication: Secondary | ICD-10-CM | POA: Diagnosis not present

## 2020-08-03 DIAGNOSIS — N3946 Mixed incontinence: Secondary | ICD-10-CM | POA: Diagnosis not present

## 2020-08-03 DIAGNOSIS — I129 Hypertensive chronic kidney disease with stage 1 through stage 4 chronic kidney disease, or unspecified chronic kidney disease: Secondary | ICD-10-CM | POA: Diagnosis not present

## 2020-08-03 DIAGNOSIS — E1165 Type 2 diabetes mellitus with hyperglycemia: Secondary | ICD-10-CM | POA: Diagnosis not present

## 2020-08-03 DIAGNOSIS — Y92009 Unspecified place in unspecified non-institutional (private) residence as the place of occurrence of the external cause: Secondary | ICD-10-CM | POA: Diagnosis not present

## 2020-08-03 DIAGNOSIS — W19XXXD Unspecified fall, subsequent encounter: Secondary | ICD-10-CM | POA: Diagnosis not present

## 2020-08-03 DIAGNOSIS — E1122 Type 2 diabetes mellitus with diabetic chronic kidney disease: Secondary | ICD-10-CM | POA: Diagnosis not present

## 2020-08-03 DIAGNOSIS — E785 Hyperlipidemia, unspecified: Secondary | ICD-10-CM

## 2020-08-03 DIAGNOSIS — I872 Venous insufficiency (chronic) (peripheral): Secondary | ICD-10-CM | POA: Diagnosis not present

## 2020-08-03 DIAGNOSIS — I2584 Coronary atherosclerosis due to calcified coronary lesion: Secondary | ICD-10-CM | POA: Diagnosis not present

## 2020-08-03 DIAGNOSIS — Z8673 Personal history of transient ischemic attack (TIA), and cerebral infarction without residual deficits: Secondary | ICD-10-CM | POA: Diagnosis not present

## 2020-08-03 NOTE — Telephone Encounter (Signed)
Scheduled

## 2020-08-03 NOTE — Telephone Encounter (Signed)
Julie Mora - Alvis Lemmings  Julie Mora called to say that she feels like [facility] needs a bedside commode, to help prevent falls. She does spend a lot of time alone, even though last fall was while her sister was there. Julie Mora did say if you agree with this we would have to send order to [facility] because they do not provide these type of supplies.

## 2020-08-03 NOTE — Telephone Encounter (Signed)
Can you set up phone or virtual visit because of this fall?

## 2020-08-04 DIAGNOSIS — I872 Venous insufficiency (chronic) (peripheral): Secondary | ICD-10-CM | POA: Diagnosis not present

## 2020-08-04 DIAGNOSIS — I129 Hypertensive chronic kidney disease with stage 1 through stage 4 chronic kidney disease, or unspecified chronic kidney disease: Secondary | ICD-10-CM | POA: Diagnosis not present

## 2020-08-04 DIAGNOSIS — I2584 Coronary atherosclerosis due to calcified coronary lesion: Secondary | ICD-10-CM | POA: Diagnosis not present

## 2020-08-04 DIAGNOSIS — E1151 Type 2 diabetes mellitus with diabetic peripheral angiopathy without gangrene: Secondary | ICD-10-CM | POA: Diagnosis not present

## 2020-08-04 DIAGNOSIS — I890 Lymphedema, not elsewhere classified: Secondary | ICD-10-CM | POA: Diagnosis not present

## 2020-08-04 DIAGNOSIS — E1165 Type 2 diabetes mellitus with hyperglycemia: Secondary | ICD-10-CM | POA: Diagnosis not present

## 2020-08-04 DIAGNOSIS — I700 Atherosclerosis of aorta: Secondary | ICD-10-CM | POA: Diagnosis not present

## 2020-08-04 DIAGNOSIS — E1122 Type 2 diabetes mellitus with diabetic chronic kidney disease: Secondary | ICD-10-CM | POA: Diagnosis not present

## 2020-08-04 DIAGNOSIS — N1831 Chronic kidney disease, stage 3a: Secondary | ICD-10-CM | POA: Diagnosis not present

## 2020-08-05 DIAGNOSIS — L97221 Non-pressure chronic ulcer of left calf limited to breakdown of skin: Secondary | ICD-10-CM | POA: Diagnosis not present

## 2020-08-08 NOTE — Patient Instructions (Signed)
Since you are living alone it is imperative that she keep the life alert device with you at all times and keep the battery current and working.  Continue to see physicians at [facility] regarding dependent edema and help with wound of left lower extremity.  Continue to monitor Accu-Cheks at home and call if persistently elevated.

## 2020-08-08 NOTE — Progress Notes (Signed)
   Subjective:    Patient ID: Julie Mora, female    DOB: August 26, 1941, 79 y.o.   MRN: [id number]  HPI Patient fell at home on November 12. Suffered a large lower left anterior leg wound/evulsion according to emergency department notes.  Had mild oozing of wound at time of presentation to the emergency department.  X-ray of tibia and fibula showed no evidence of fracture.  Wound was reportedly complicated by patient's lymphedema.  She has recently been seen at [facility] and is having her legs wrapped and treated there.  She says they will continue to assist her and help her with this wound is well.  Patient resides alone.  Her sister stays with her for several hours each evening but does time around bedtime.  Patient does have a life alert device to assist if she has an emergency but apparently when she fell on November 12, she discovered that the battery in that device was not working.  When patient falls on the floor she cannot get up without assistance.  Therefore it was difficult for her because she was found on the floor for some time before she was able to get assistance.  We had a talk today about living alone.  She does not have any arrangements to go to a retirement center.  She has a dog.  She prefers to stay in her own home.  She does have some help that comes in intermittently for home health care but in the evenings after supper when her sister goes home, she is alone.  This is of concern, and it is exceedingly important that the life alert device be working at all times.    Review of Systems see above-no new complaints other than leg wound     Objective:   Physical Exam  Unable to examine patient.  She is unable to connect today by computer so I cannot see the wound.  She preferred a telephone visit.  She is identified using 2 identifiers as Julie Mora, a patient in this practice.  She is agreeable to visit in this format today.  I am in my office and she is at her  home.  She reports she is afebrile.  Patient is in good spirits.  She is able to give a clear concise history and is very articulate.  No slurred speech.  Able to remember surrounding her fall.  Apparently did not suffer a concussion with her fall.  Denies weakness or numbness of the upper and lower extremities.      Assessment & Plan:  Left leg wound secondary to a fall at home which will be treated by [facility]  Dependent edema-also being treated by vein specialists  History of diabetes mellitus-insulin-dependent-followed by Dr. Chalmers Cater  Remote history of pemphigus  History of falls  History of chronic kidney disease  Stress and urge urinary incontinence  Mixed hyperlipidemia  History of CVA involving left occipital parietal area almost 2006  Essential hypertension  Chronic kidney disease-stage IIIa  History of falls    Plan: We discussed importance of keeping battery and life alert device and keeping it with her at all times.  She will continue to have a leg wound treated by vein specialist as well as her lymphedema of both lower extremities.  Time spent with phone call is 20 minutes

## 2020-08-09 DIAGNOSIS — L97211 Non-pressure chronic ulcer of right calf limited to breakdown of skin: Secondary | ICD-10-CM | POA: Diagnosis not present

## 2020-08-09 DIAGNOSIS — L97221 Non-pressure chronic ulcer of left calf limited to breakdown of skin: Secondary | ICD-10-CM | POA: Diagnosis not present

## 2020-08-10 DIAGNOSIS — I700 Atherosclerosis of aorta: Secondary | ICD-10-CM | POA: Diagnosis not present

## 2020-08-10 DIAGNOSIS — I872 Venous insufficiency (chronic) (peripheral): Secondary | ICD-10-CM | POA: Diagnosis not present

## 2020-08-10 DIAGNOSIS — E1151 Type 2 diabetes mellitus with diabetic peripheral angiopathy without gangrene: Secondary | ICD-10-CM | POA: Diagnosis not present

## 2020-08-10 DIAGNOSIS — N1831 Chronic kidney disease, stage 3a: Secondary | ICD-10-CM | POA: Diagnosis not present

## 2020-08-10 DIAGNOSIS — E1122 Type 2 diabetes mellitus with diabetic chronic kidney disease: Secondary | ICD-10-CM | POA: Diagnosis not present

## 2020-08-10 DIAGNOSIS — E1165 Type 2 diabetes mellitus with hyperglycemia: Secondary | ICD-10-CM | POA: Diagnosis not present

## 2020-08-10 DIAGNOSIS — I2584 Coronary atherosclerosis due to calcified coronary lesion: Secondary | ICD-10-CM | POA: Diagnosis not present

## 2020-08-10 DIAGNOSIS — I129 Hypertensive chronic kidney disease with stage 1 through stage 4 chronic kidney disease, or unspecified chronic kidney disease: Secondary | ICD-10-CM | POA: Diagnosis not present

## 2020-08-10 DIAGNOSIS — I890 Lymphedema, not elsewhere classified: Secondary | ICD-10-CM | POA: Diagnosis not present

## 2020-08-11 ENCOUNTER — Other Ambulatory Visit: Payer: Self-pay

## 2020-08-11 ENCOUNTER — Ambulatory Visit (INDEPENDENT_AMBULATORY_CARE_PROVIDER_SITE_OTHER): Payer: Medicare PPO | Admitting: Internal Medicine

## 2020-08-11 DIAGNOSIS — Z0000 Encounter for general adult medical examination without abnormal findings: Secondary | ICD-10-CM | POA: Diagnosis not present

## 2020-08-11 DIAGNOSIS — I700 Atherosclerosis of aorta: Secondary | ICD-10-CM | POA: Diagnosis not present

## 2020-08-11 DIAGNOSIS — I129 Hypertensive chronic kidney disease with stage 1 through stage 4 chronic kidney disease, or unspecified chronic kidney disease: Secondary | ICD-10-CM | POA: Diagnosis not present

## 2020-08-11 DIAGNOSIS — E1122 Type 2 diabetes mellitus with diabetic chronic kidney disease: Secondary | ICD-10-CM | POA: Diagnosis not present

## 2020-08-11 DIAGNOSIS — I872 Venous insufficiency (chronic) (peripheral): Secondary | ICD-10-CM | POA: Diagnosis not present

## 2020-08-11 DIAGNOSIS — N1831 Chronic kidney disease, stage 3a: Secondary | ICD-10-CM | POA: Diagnosis not present

## 2020-08-11 DIAGNOSIS — I890 Lymphedema, not elsewhere classified: Secondary | ICD-10-CM | POA: Diagnosis not present

## 2020-08-11 DIAGNOSIS — L97211 Non-pressure chronic ulcer of right calf limited to breakdown of skin: Secondary | ICD-10-CM | POA: Diagnosis not present

## 2020-08-11 DIAGNOSIS — I2584 Coronary atherosclerosis due to calcified coronary lesion: Secondary | ICD-10-CM | POA: Diagnosis not present

## 2020-08-11 DIAGNOSIS — E1151 Type 2 diabetes mellitus with diabetic peripheral angiopathy without gangrene: Secondary | ICD-10-CM | POA: Diagnosis not present

## 2020-08-11 DIAGNOSIS — L97221 Non-pressure chronic ulcer of left calf limited to breakdown of skin: Secondary | ICD-10-CM | POA: Diagnosis not present

## 2020-08-11 DIAGNOSIS — E1165 Type 2 diabetes mellitus with hyperglycemia: Secondary | ICD-10-CM | POA: Diagnosis not present

## 2020-08-13 ENCOUNTER — Encounter: Payer: Self-pay | Admitting: Internal Medicine

## 2020-08-13 NOTE — Progress Notes (Signed)
Subjective:    Patient ID: Julie Mora, female    DOB: Sep 12, 1941, 79 y.o.   MRN: [id number]  HPI 79 year old Female seen via telephone visit due to the Coronavirus pandemic and due to patient currently not driving.  She is unable to connect virtually today.  She is agreeable to visit in this format today.  She is identified using 2 identifiers as Julie Mora, a longstanding patient in this practice.  She is at home and I am in my office.  On November 12 patient presented to the emergency department after falling at home and suffering a left leg wound.  She has history of lymphedema and currently is being followed at [facility].  She has a remote history of pemphigus and has an appointment in December to see her dermatologist, Dr. Denna Haggard.  She states he called her recently and did not think she had a recurrence of pemphigus.  Patient was at home ambulating to the bathroom and halfway down her hallway she indicated her legs became weak and she fell resulting in leg wound to the left lower leg.  She does have a life alert device but reports it has had some issues recently and now she has not working again.  She is spending her late evenings and early morning hours alone by herself and she does have to get up to urinate.  Her sister stays with her in the early evening hours.  She also has home health aides that help her during the day a few days a week.  She was admitted to the hospital with febrile illness September 3 through September 8th.  History of chronic venous insufficiency.  History of chronic kidney disease stage III, hypertension, hyperlipidemia insulin-dependent diabetes mellitus followed by Dr. Chalmers Cater.  Hospitalization in September started when she had a fall at home when she missed her recliner as she tried to sit down.  Apparently was unable to get up and was on the floor while.  In the emergency department she was febrile with temperature up to 101.3 degrees and heart rate  was in the 120s.  Lactic acid was 1.3.  UA had large leukocytes.  Blood cultures were negative.  Was diagnosed with atypical pneumonia, UTI bilateral lower extremity cellulitis.  Septic work-up was negative except for urine culture with Enterococcus faecalis.  Blood cultures reveal no growth.  Chest CT angiogram was not negative.  She was treated with ceftriaxone and azithromycin.  Subsequently was switched to p.o. doxycycline.  Has an aide that is near 2 days a week on Mondays and Thursdays and can go to the store from her.  She would like to have a home health aide that could be there another 2 days a week.  Has history of cyst on her kidney and says she needs follow-up with urologist in the near future.  Has her medications delivered by [facility]  Says physical therapy and OT have been discontinued recently status post her hospitalization    Review of Systems no new complaints     Objective:   Physical Exam Patient has not taken vital signs today.  Says sister will need FMLA form if she is going to stay with her and take her to the doctor.  Has had no falls since returning home.  Leg is managing and she is being followed at [facility] on a regular basis at least a couple of times a week.  Says she has no fever.  Assessment & Plan:  Type 2 diabetes mellitus-followed by endocrinologist  Fall at home resulting in leg wound being treated at [facility]  History of pemphigus  History of recurrent urinary tract infections  Hyperlipidemia treated with statin  History of hypertension reported is stable  History of stage IIIa chronic kidney disease  Plan: Would like for patient to have office visit for health maintenance exam after the first of the year.  She is agreeable to this.  Home health orders will be signed.  Subjective:   Patient presents for Medicare Annual/Subsequent preventive examination.  Review Past Medical/Family/Social: See above   Risk  Factors  Current exercise habits: Unable to exercise Dietary issues discussed: Low-fat low carbohydrate discussed  Cardiac risk factors: Hyperlipidemia and diabetes mellitus  Depression Screen  (Note: if answer to either of the following is "Yes", a more complete depression screening is indicated)   Over the past two weeks, have you felt down, depressed or hopeless? No  Over the past two weeks, have you felt little interest or pleasure in doing things? No Have you lost interest or pleasure in daily life? No Do you often feel hopeless? No Do you cry easily over simple problems? No   Activities of Daily Living  In your present state of health, do you have any difficulty performing the following activities?:   Driving?  Currently not driving Managing money? No  Feeding yourself? No  Getting from bed to chair? No  Climbing a flight of stairs? No  Preparing food and eating?:  Has assistance with  sister and  an aide Bathing or showering? No  Getting dressed: No  Getting to the toilet? No  Using the toilet:No  Moving around from place to place: No  In the past year have you fallen or had a near fall?:No  Are you sexually active? No  Do you have more than one partner? No   Hearing Difficulties: No  Do you often ask people to speak up or repeat themselves? No  Do you experience ringing or noises in your ears? No  Do you have difficulty understanding soft or whispered voices? No  Do you feel that you have a problem with memory? No Do you often misplace items?  Sometimes misplace keys   Home Safety:  Do you have a smoke alarm at your residence? Yes Do you have grab bars in the bathroom?  Yes Do you have throw rugs in your house?  No   Cognitive Testing  Alert? Yes Normal Appearance?Yes  Oriented to person? Yes Place? Yes  Time? Yes  Recall of three objects?  Not tested Can perform simple calculations?  Not tested Displays appropriate judgment?Yes  Can read the correct time  from a watch face?Yes   List the Names of Other Physician/Practitioners you currently use:  See referral list for the physicians patient is currently seeing.     Review of Systems: See above   Objective:     Connected by telephone today with patient.  She is very lucid and gives a clear concise history of events over the past few weeks with her health.  Affect, thought process and mentation appear to be normal.  Memory was not tested.   Assessment:    Annual wellness medicare visit  Plan:    During the course of the visit the patient was educated and counseled about appropriate screening and preventive services including:   Recommend flu vaccine  Has had 2 COVID-19 immunizations and pneumococcal immunizations  are up-to-date.     Patient Instructions (the written plan) was given to the patient.  Medicare Attestation  I have personally reviewed:  The patient's medical and social history  Their use of alcohol, tobacco or illicit drugs  Their current medications and supplements  The patient's functional ability including ADLs,fall risks, home safety risks, cognitive, and hearing and visual impairment  Diet and physical activities  Evidence for depression or mood disorders  The patient's weight, height, BMI, and visual acuity have been recorded in the chart. I have made referrals, counseling, and provided education to the patient based on review of the above and I have provided the patient with a written personalized care plan for preventive services.

## 2020-08-13 NOTE — Patient Instructions (Signed)
Suggest in- person visit in 3 months with fasting labs and health maintenance exam.  Agree with plans for home health aide and home health nursing orders have been signed.

## 2020-08-16 DIAGNOSIS — I8311 Varicose veins of right lower extremity with inflammation: Secondary | ICD-10-CM | POA: Diagnosis not present

## 2020-08-16 DIAGNOSIS — L97211 Non-pressure chronic ulcer of right calf limited to breakdown of skin: Secondary | ICD-10-CM | POA: Diagnosis not present

## 2020-08-16 DIAGNOSIS — L97221 Non-pressure chronic ulcer of left calf limited to breakdown of skin: Secondary | ICD-10-CM | POA: Diagnosis not present

## 2020-08-18 DIAGNOSIS — Z961 Presence of intraocular lens: Secondary | ICD-10-CM | POA: Diagnosis not present

## 2020-08-18 DIAGNOSIS — H04123 Dry eye syndrome of bilateral lacrimal glands: Secondary | ICD-10-CM | POA: Diagnosis not present

## 2020-08-18 DIAGNOSIS — H18002 Unspecified corneal deposit, left eye: Secondary | ICD-10-CM | POA: Diagnosis not present

## 2020-08-18 DIAGNOSIS — H169 Unspecified keratitis: Secondary | ICD-10-CM | POA: Diagnosis not present

## 2020-08-19 DIAGNOSIS — I890 Lymphedema, not elsewhere classified: Secondary | ICD-10-CM | POA: Diagnosis not present

## 2020-08-20 ENCOUNTER — Ambulatory Visit: Payer: Medicare PPO | Admitting: Podiatry

## 2020-08-20 ENCOUNTER — Encounter: Payer: Self-pay | Admitting: Podiatry

## 2020-08-20 ENCOUNTER — Other Ambulatory Visit: Payer: Self-pay

## 2020-08-20 DIAGNOSIS — B351 Tinea unguium: Secondary | ICD-10-CM | POA: Diagnosis not present

## 2020-08-20 DIAGNOSIS — M79674 Pain in right toe(s): Secondary | ICD-10-CM

## 2020-08-20 DIAGNOSIS — E1159 Type 2 diabetes mellitus with other circulatory complications: Secondary | ICD-10-CM

## 2020-08-20 DIAGNOSIS — M79675 Pain in left toe(s): Secondary | ICD-10-CM | POA: Diagnosis not present

## 2020-08-20 NOTE — Progress Notes (Signed)
This patient returns to my office for at risk foot care.  This patient requires this care by a professional since this patient will be at risk due to having history of DVT and diabetes mellitus...  She presents to the office wearing two unna boots  This patient is unable to cut nails herself since the patient cannot reach her nails.These nails are painful walking and wearing shoes.  This patient presents for at risk foot care today.  General Appearance  Alert, conversant and in no acute stress.  Vascular  Deferred  Neurologic  Deferred  Nails Thick disfigured discolored nails with subungual debris  from hallux to fifth toes bilaterally. No evidence of bacterial infection or drainage bilaterally.  Orthopedic  deferred  Skin  deferred   Onychomycosis  Pain in right toes  Pain in left toes  Consent was obtained for treatment procedures.   Mechanical debridement of nails 1-5  bilaterally performed with a nail nipper.  Filed with dremel without incident.    Return office visit    3 months                  Told patient to return for periodic foot care and evaluation due to potential at risk complications.   Gardiner Barefoot DPM

## 2020-08-23 DIAGNOSIS — I890 Lymphedema, not elsewhere classified: Secondary | ICD-10-CM | POA: Diagnosis not present

## 2020-08-25 NOTE — Telephone Encounter (Signed)
Patient has now called to say she is using her walker good and does not feel like she needs a bedside commode.

## 2020-08-26 DIAGNOSIS — I890 Lymphedema, not elsewhere classified: Secondary | ICD-10-CM | POA: Diagnosis not present

## 2020-08-30 DIAGNOSIS — I890 Lymphedema, not elsewhere classified: Secondary | ICD-10-CM | POA: Diagnosis not present

## 2020-08-30 NOTE — Telephone Encounter (Signed)
This encounter was created in error - please disregard.

## 2020-09-02 DIAGNOSIS — I890 Lymphedema, not elsewhere classified: Secondary | ICD-10-CM | POA: Diagnosis not present

## 2020-09-06 ENCOUNTER — Ambulatory Visit: Payer: Medicare PPO | Admitting: Dermatology

## 2020-09-06 DIAGNOSIS — I890 Lymphedema, not elsewhere classified: Secondary | ICD-10-CM | POA: Diagnosis not present

## 2020-09-09 DIAGNOSIS — I890 Lymphedema, not elsewhere classified: Secondary | ICD-10-CM | POA: Diagnosis not present

## 2020-09-15 DIAGNOSIS — I890 Lymphedema, not elsewhere classified: Secondary | ICD-10-CM | POA: Diagnosis not present

## 2020-09-21 DIAGNOSIS — I890 Lymphedema, not elsewhere classified: Secondary | ICD-10-CM | POA: Diagnosis not present

## 2020-09-27 DIAGNOSIS — I890 Lymphedema, not elsewhere classified: Secondary | ICD-10-CM | POA: Diagnosis not present

## 2020-11-03 ENCOUNTER — Telehealth: Payer: Self-pay | Admitting: *Deleted

## 2020-11-03 NOTE — Telephone Encounter (Signed)
Patient is calling to cancel/reschedule her upcoming appointment with Dr Prudence Davidson, problems with eyes, going to [facility] for blood drops.

## 2020-11-04 NOTE — Telephone Encounter (Signed)
done

## 2020-11-19 ENCOUNTER — Ambulatory Visit: Payer: Medicare PPO | Admitting: Podiatry

## 2020-11-22 ENCOUNTER — Telehealth: Payer: Self-pay | Admitting: Internal Medicine

## 2020-11-22 NOTE — Telephone Encounter (Signed)
Received Paper from [facility], [facility], Doctors Letter to be filled out and faxed back.  [address] [place], [place] [address]  [phone] [place] of Sales [phone] cell

## 2020-11-22 NOTE — Telephone Encounter (Signed)
Called and spoke with patient, a representative from [facility] had come out last week and she had talk with them. She is not ready to make any decisions at this time. She still has to decide if she is going to sell her home or rent. She is also checking into other places, then she will make a decision at to where she defiantly wants to go and then she will let me now. I let patient know once she makes a decision Dr Renold Genta would be happy to sit down with her and fill out the [facility], so that we make sure that we answer all of the questions correctly.

## 2020-12-29 ENCOUNTER — Ambulatory Visit: Payer: Medicare PPO | Admitting: Podiatry

## 2020-12-29 ENCOUNTER — Telehealth: Payer: Self-pay | Admitting: *Deleted

## 2020-12-29 NOTE — Telephone Encounter (Signed)
Patient called to cancel appointment today at 3:45. She said that her legs will not support her to walk , will call back to reschedule after going thru PT.

## 2020-12-29 NOTE — Telephone Encounter (Signed)
Appt cx.

## 2021-03-08 ENCOUNTER — Telehealth: Payer: Self-pay | Admitting: Internal Medicine

## 2021-03-08 NOTE — Telephone Encounter (Signed)
Gwendolin Briel [phone]  [name] called today to say she has made up her mind and she has started the process to move into [facility]. She would like [place] to fill out what paperwork is necessary. I ask her to have them to fax to [place] what they require.

## 2021-03-23 ENCOUNTER — Telehealth: Payer: Self-pay | Admitting: *Deleted

## 2021-03-23 NOTE — Patient Outreach (Signed)
[facility]) Care Management  03/23/2021  Julie Mora 1941/05/02 [id number]   CSW contacted pt and confirmed identity. CSW introduced self, role and reason for call; plans for ALF placement.  Per pt, she is preparing to move to [facility] later this month; "I am figuring out what I can take and what I have to get rid of....scheduling movers and all". Pt also reports her sister, [name], has been helping and gave permission to speak with her as well as the ALF staff.  CSW spoke with [name] at [facility] who confirms plans for her admission later this month (tentatively for 04/04/21). She has shared with CSW the documents that will need to be completed by PCP as well as DME needs/orders for her room that will need to be in place prior to admit (ie: hospital bed, etc).  CSW will advise PCP office of the specifics for completion of the pre-admit logistics. Pt will see PCP on 03/28/21 and can hopefully get the paperwork completed at that time; as well as TB test and COVID test as required by ALF prior to admission.   Pt and sister aware of the above and will also share paperwork needs with pt's sister to help with coordination.    Eduard Clos, MSW, [place] Worker  [place] [phone]

## 2021-03-28 ENCOUNTER — Encounter: Payer: Self-pay | Admitting: Internal Medicine

## 2021-03-28 ENCOUNTER — Other Ambulatory Visit: Payer: Self-pay

## 2021-03-28 ENCOUNTER — Ambulatory Visit: Payer: Medicare PPO | Admitting: Internal Medicine

## 2021-03-28 VITALS — BP 140/80 | HR 100 | Temp 97.7°F | Ht 62.0 in | Wt 250.0 lb

## 2021-03-28 DIAGNOSIS — Z7901 Long term (current) use of anticoagulants: Secondary | ICD-10-CM | POA: Diagnosis not present

## 2021-03-28 DIAGNOSIS — Z111 Encounter for screening for respiratory tuberculosis: Secondary | ICD-10-CM | POA: Diagnosis not present

## 2021-03-28 DIAGNOSIS — N1831 Chronic kidney disease, stage 3a: Secondary | ICD-10-CM | POA: Diagnosis not present

## 2021-03-28 DIAGNOSIS — Z794 Long term (current) use of insulin: Secondary | ICD-10-CM

## 2021-03-28 DIAGNOSIS — E782 Mixed hyperlipidemia: Secondary | ICD-10-CM | POA: Diagnosis not present

## 2021-03-28 DIAGNOSIS — Z23 Encounter for immunization: Secondary | ICD-10-CM | POA: Diagnosis not present

## 2021-03-28 DIAGNOSIS — M810 Age-related osteoporosis without current pathological fracture: Secondary | ICD-10-CM

## 2021-03-28 DIAGNOSIS — E1169 Type 2 diabetes mellitus with other specified complication: Secondary | ICD-10-CM | POA: Diagnosis not present

## 2021-03-28 DIAGNOSIS — N3946 Mixed incontinence: Secondary | ICD-10-CM

## 2021-03-28 DIAGNOSIS — Z872 Personal history of diseases of the skin and subcutaneous tissue: Secondary | ICD-10-CM

## 2021-03-28 DIAGNOSIS — I10 Essential (primary) hypertension: Secondary | ICD-10-CM

## 2021-03-28 DIAGNOSIS — Z8673 Personal history of transient ischemic attack (TIA), and cerebral infarction without residual deficits: Secondary | ICD-10-CM

## 2021-03-28 DIAGNOSIS — R609 Edema, unspecified: Secondary | ICD-10-CM

## 2021-03-28 DIAGNOSIS — E8881 Metabolic syndrome: Secondary | ICD-10-CM

## 2021-03-28 DIAGNOSIS — E119 Type 2 diabetes mellitus without complications: Secondary | ICD-10-CM

## 2021-03-28 NOTE — Progress Notes (Addendum)
Subjective:    Patient ID: Julie Mora, female    DOB: 09/17/1941, 80 y.o.   MRN: [id number]  HPI 80 year old Female seen for evaluation to enter [facility]. She is accompanied by her sister who is supportive.  Patient has a remote history of pemphigus and has seen Dr. Denna Haggard but not recently.  She also has dependent edema.Currently being seen for LE lymphedema by [facility].  She has issues ambulating.  She has urge urinary incontinence and sometimes has issues getting to the bathroom easily.  For that reason, we are recommending a bedside commode.  She is morbidly obese.  For that reason we are recommending a Hoyer lift.  She was brought to the office via wheelchair.  Recommending hospital bed and bariatric wheelchair  We are recommending a toilet seat that attaches to the toilet as she needs an elevated toilet seat due to issues ambulating and osteoarthritis on knees.  She will also need a bariatric wheelchair for transportation.  Her weight is 270 pounds and her height is 5 feet 2 inches. She has deconditioning in addition to osteoarthritis and lower extremity edema which is longstanding due to venous insufficiency and history of pemphigus. Unable to walk from car to my office today.She also is high risk for falling due to issues ambulating. Would benefit from PT evaluation.  She has a history of chronic venous insufficiency with leg swelling.  History of chronic kidney disease stage IIIa which is stable and essential hypertension. History of insulin-dependent diabetes which is followed by Dr. Chalmers Cater, Endocrinologist.  History of cyst on her kidney followed at [facility].No recent follow up there.  She sees Ophthalmologist, Dr. Susa Simmonds with history of dry eyes.  History of cataract extraction August 2013 on the right eye and cataract extraction of left October 2013.  History of pseudophakia both eyes and keratic precipitates of left.  History of  bilateral dry eyes.  History of urinary infections but none recently.  She was admitted September 2018 to the hospital with gallstone pancreatitis and underwent a laparoscopic cholecystectomy.  Was treated for pemphigus which was originally diagnosed in 2010 with Rituxan in 2018 by Dr. Alen Blew.  Also followed by Dr. Denna Haggard, dermatologist but has not seen him recently.  She also apparently is now seeing Dr. Renaldo Reel at [place] for dependent edema according to her sister.  History of paronychia long finger right hand treated with Septra . Unerwent incision and drainage of paronychia by Dr. Fredna Dow in April 2018.  In November 2010 she was admitted with vomiting and acute renal failure.  Was subsequently readmitted in 2010 with C. difficile colitis, adrenal insufficiency and anasarca due to multifactorial reasons.  In early 2011 she developed an infected leg ulcer left lower leg and was bedbound for some time.  Admitted to [facility] ICU in February 2010 for presumed sepsis and tachycardia.  Blood culture grew staph aureus and urine culture grew greater than 100,000 colonies per milliliter E. coli.  Sometime around February through March 2012 she was started on Florinef in the hospital.  It seems to have been due to electrolyte imbalance.  This has subsequently been discontinued now for some time and her electrolytes have remained stable.  Social history: She is a widow.  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  She has a Scientist, water quality.  Does not smoke and rarely consumes alcohol.  She also worked as a Warehouse manager.  No children.  Was placed on chronic anticoagulation  for history of left occipital parietal CVA in 2006 and history of DVT in 2010.  There is a remote history of iron deficiency.  There is history of stress and urge urinary incontinence and has to wear protective underwear.  Family history: Father died at age 14 reportedly of pancreatic cancer.  Mother died of  complications of "old age and dementia ".  1 sister in  reasonably good health.  No brothers.   History of osteoporosis treated with Fosamax in the past but not currently  History of cystic lesion pancreatic head that has been stable on MRI in 2017.   Review of Systems denies chest pain or SOB at rest     Objective:   Physical Exam vital signs reviewed. She has multiple papular lesions on her lower extremities bilaterally.  Sister indicates she is now being treated for lymphedema at [facility] [facility].Sister says they do not think this is pemphigus but related to venous insufficiency. Currently legs are not wrapped.  Her chest is clear to auscultation.  Cardiac exam :2/6 systolic ejection murmur.  Abdomen is obese without masses.  She is alert and oriented x3.  She knows me.  There are no gross focal deficits on brief neurological exam.  Cranial nerves II through XII are grossly intact.  Her affect thought and judgment appear to be normal.       Assessment & Plan:  Lower extremity  edema being treated at [facility].  Dr. Denna Haggard diagnosed her with pemphigus  Aanumber of years ago  she was treated with monoclonal antibody inhibitor by Dr. Alen Blew [facility] for this.  History of multiseptated lesion lower pole of left kidney-needs follow-up with [facility]  Chronic kidney disease stage IIIa-stable  Insulin-dependent diabetes followed by Dr. Chalmers Cater, Endocrinologist  Morbid obesity-it is difficult for her to ambulate due to osteoarthritis of her knees and obesity  History of osteoporosis treated previously with Fosamax.  Bone density study in 2016 showed T score -3.2  Remote history of DVT-has been treated in the past with Pradaxa. No recent episodes. Not on anticoagulation at this time other than low dose ASA 81 mg daily  History of pancreatic lesion at head of pancreas but was stable on MRI in 2017  Dependent edema has been treated previously with diuretic  History  of recurrent urinary infections-seen by Urologist  Essential hypertension-stable on current regimen  History of CVA treated with Pradaxa in the remote past but currently NOT on anticoagulation other than ASA 81 mg daily.  History of elevated triglycerides treated with simvastatin  Dependent edema has been treated with torsemide in the past but now being treated at [facility]. Unfortunately those records are currently not available to me.  Anticipating patient was moved to the assisted living facility, she well need to have a hospital bed (electric) as patient has been sleeping in a recliner.  She is unable to get in and out of a regular bed without 2+ assistance.  She is physically unable to move in the bed without nursing assistance.  She is unable to get out of the bed without 2 people assisting her for equipment assisting to prevent a fall with this maneuver.  Once she moves into facility, she will need nursing assistance for repositioning and getting in and out of the bed.  Regarding request for wheelchair: She has mobility limitations that impairs her ability to ambulate due to osteoarthritis of her knees and significant lower extremity edema.  She is high risk  for falling.  She cannot use a cane, crutches or walker to resolve this issue sufficiently due to her age and generalized weakness.  She will need staff to provide assistance.  Labs drawn today include Quantiferon test for TB, CBC with diff, lipid panel, CBc, C-met and Hgb AIC.  Tdap given.

## 2021-03-28 NOTE — Patient Instructions (Addendum)
Labs drawn and pending including QuantiFERON test for entry into assisted living facility.  Form to be completed. Tetanus update given,

## 2021-03-29 ENCOUNTER — Other Ambulatory Visit: Payer: Self-pay | Admitting: *Deleted

## 2021-03-29 NOTE — Telephone Encounter (Signed)
Faxed Completed FL2, [facility] Form, Medication List, Demographics, everything faxed to [place] for DME equipment, immunization list, H & P to [phone].  Only thing waiting on is lab results for TB test.

## 2021-03-29 NOTE — Progress Notes (Signed)
We are having difficulty getting electric bed and wheelchair approved. Can you not help [place] with this?

## 2021-03-29 NOTE — Patient Outreach (Signed)
[facility]) Care Management  03/29/2021  Julie Mora [birth year] [id number]   CSW spoke with pt and her sister today by phone. All paperwork has been completed by Dr Renold Genta and [facility] is anticipating her move next Monday.  Pt is excited and pleased to take her dog with her. Pt's sister is communicating with [employer] about the DME.  Pt's sister reports Dr Renold Genta will no longer be pt's PCP- the Provider at ALF will oversee her care going forward per their report.   CSW will sign off and have advised them to call if needs arise.   Both pt and sister appreciative of assistance.   Eduard Clos, MSW, [place] Worker  [place] [phone]

## 2021-03-29 NOTE — Telephone Encounter (Signed)
Faxed Prescription for DME equpiment need to move into [facility], letter, office notes, demographics to [facility] [employer] [facility] [phone].

## 2021-03-30 ENCOUNTER — Telehealth: Payer: Self-pay

## 2021-03-30 ENCOUNTER — Other Ambulatory Visit: Payer: Self-pay | Admitting: *Deleted

## 2021-03-30 LAB — COMPLETE METABOLIC PANEL WITH GFR
AG Ratio: 0.9 (calc) — ABNORMAL LOW (ref 1.0–2.5)
ALT: 5 U/L — ABNORMAL LOW (ref 6–29)
AST: 13 U/L (ref 10–35)
Albumin: 2.9 g/dL — ABNORMAL LOW (ref 3.6–5.1)
Alkaline phosphatase (APISO): 62 U/L (ref 37–153)
BUN/Creatinine Ratio: 15 (calc) (ref 6–22)
BUN: 17 mg/dL (ref 7–25)
CO2: 33 mmol/L — ABNORMAL HIGH (ref 20–32)
Calcium: 9 mg/dL (ref 8.6–10.4)
Chloride: 106 mmol/L (ref 98–110)
Creat: 1.11 mg/dL — ABNORMAL HIGH (ref 0.60–1.00)
Globulin: 3.1 g/dL (calc) (ref 1.9–3.7)
Glucose, Bld: 157 mg/dL — ABNORMAL HIGH (ref 65–99)
Potassium: 4.7 mmol/L (ref 3.5–5.3)
Sodium: 144 mmol/L (ref 135–146)
Total Bilirubin: 0.3 mg/dL (ref 0.2–1.2)
Total Protein: 6 g/dL — ABNORMAL LOW (ref 6.1–8.1)
eGFR: 51 mL/min/{1.73_m2} — ABNORMAL LOW (ref >=60)

## 2021-03-30 LAB — LIPID PANEL
Cholesterol: 106 mg/dL (ref <200)
HDL: 40 mg/dL — ABNORMAL LOW (ref >=50)
LDL Cholesterol (Calc): 47 mg/dL (calc)
Non-HDL Cholesterol (Calc): 66 mg/dL (calc) (ref <130)
Total CHOL/HDL Ratio: 2.7 (calc) (ref <5.0)
Triglycerides: 100 mg/dL (ref <150)

## 2021-03-30 LAB — CBC WITH DIFFERENTIAL/PLATELET
Absolute Monocytes: 819 cells/uL (ref 200–950)
Basophils Absolute: 36 cells/uL (ref 0–200)
Basophils Relative: 0.4 %
Eosinophils Absolute: 428 cells/uL (ref 15–500)
Eosinophils Relative: 4.7 %
HCT: 38.9 % (ref 35.0–45.0)
Hemoglobin: 12.5 g/dL (ref 11.7–15.5)
Lymphs Abs: 2675 cells/uL (ref 850–3900)
MCH: 31.3 pg (ref 27.0–33.0)
MCHC: 32.1 g/dL (ref 32.0–36.0)
MCV: 97.3 fL (ref 80.0–100.0)
MPV: 10.7 fL (ref 7.5–12.5)
Monocytes Relative: 9 %
Neutro Abs: 5142 cells/uL (ref 1500–7800)
Neutrophils Relative %: 56.5 %
Platelets: 260 10*3/uL (ref 140–400)
RBC: 4 10*6/uL (ref 3.80–5.10)
RDW: 12.4 % (ref 11.0–15.0)
Total Lymphocyte: 29.4 %
WBC: 9.1 10*3/uL (ref 3.8–10.8)

## 2021-03-30 LAB — QUANTIFERON-TB GOLD PLUS
Mitogen-NIL: >10 IU/mL
NIL: 0.04 IU/mL
QuantiFERON-TB Gold Plus: NEGATIVE
TB1-NIL: <0 IU/mL
TB2-NIL: <0 IU/mL

## 2021-03-30 LAB — HEMOGLOBIN A1C
Hgb A1c MFr Bld: 8.8 % of total Hgb — ABNORMAL HIGH (ref <5.7)
Mean Plasma Glucose: 206 mg/dL
eAG (mmol/L): 11.4 mmol/L

## 2021-03-30 LAB — TSH: TSH: 2.01 mIU/L (ref 0.40–4.50)

## 2021-03-30 NOTE — Telephone Encounter (Signed)
Spoke with [name] at [employer].  Documents/notes are currently under review by intake team.   LM for Nat Christen, Social Worker with [facility] requesting call back to assist.  Joanna's contact number: [phone].   Provided Di Kindle with number for this office as well as Dr. Verlene Mayer office.

## 2021-03-30 NOTE — Patient Outreach (Signed)
[facility]) Care Management  [facility] Social Work  03/30/2021  Julie Mora 19-Nov-1940 [id number]  Subjective:  LCSW received a voicemail message from Julie Mora, Nurse with Dr. Ardelle Mora office, patient's Primary Care Physician, requesting assistance with arranging durable medical equipment for patient.  After thorough review of patient's electronic medical record in Epic, LCSW learned that patient is in need of a hospital bed, bariatric wheelchair and bedside commode for home use.  Patient will be moving into [facility] on Monday, July, 18th, so the equipment will need to be delivered prior to then.  LCSW was able to confirm with Julie Mora at [facility] that they are prepared to accept the patient on the 18th, and have the durable medical equipment delivered to the facility anytime prior to that.  LCSW then spoke with Julie Mora at [place] to ensure that they have all required paperwork for the durable medical equipment to be delivered to [facility].  Julie Mora confirmed receipt of completed OV Forms and has the orders for the equipment.  LCSW then contacted patient's sister, Julie Mora who assured LCSW that everything is in place, and that she plans to follow-up with Julie Mora at [facility] later today to nail down a delivery time and date.  LCSW provided Julie Mora with LCSW's contact information, encouraging her to contact LCSW directly if additional social work needs arise in the meantime.  Encounter Medications:  Outpatient Encounter Medications as of 03/30/2021  Medication Sig Note   ACCU-CHEK AVIVA PLUS test strip USE TO CHECK BLOOD SUGAR BEFORE BREAKFAST AND SUPPER    acetaminophen (TYLENOL) 500 MG tablet Take 500 mg by mouth every 6 (six) hours as needed for pain. 05/22/2020: Usually takes 2-3x/day   ASPIRIN 81 PO Take by mouth.    BD PEN NEEDLE NANO U/F 32G X 4 MM MISC USE AS DIRECTED    collagenase (SANTYL) ointment  Apply topically daily. To both legs    ferrous gluconate (FERGON) 324 MG tablet TAKE 1 TABLET(324 MG) BY MOUTH DAILY WITH BREAKFAST    gabapentin (NEURONTIN) 100 MG capsule Take 100 mg by mouth 3 (three) times daily.    insulin aspart (NOVOLOG) 100 UNIT/ML injection 0-15 Units, Subcutaneous, 3 times daily with meals, First dose on Sat 05/22/20 at 1700 Correction coverage: Moderate (average weight, post-op) CBG < 70: Implement Hypoglycemia Standing Orders and refer to Hypoglycemia Standing Orders sidebar report CBG 70 - 120: 0 units CBG 121 - 150: 2 units CBG 151 - 200: 3 units CBG 201 - 250: 5 units CBG 251 - 300: 8 units CBG 301 - 350: 11 units CBG 351 - 400: 15 units CBG > 400: call MD    insulin glargine (LANTUS SOLOSTAR) 100 UNIT/ML Solostar Pen Inject 20 Units into the skin daily.    magnesium 30 MG tablet Take 30 mg by mouth daily.    prednisoLONE acetate (PRED FORTE) 1 % ophthalmic suspension Place 1 drop into both eyes in the morning and at bedtime.    simvastatin (ZOCOR) 20 MG tablet Take 1 tablet (20 mg total) by mouth at bedtime.    triamcinolone cream (KENALOG) 0.1 % Apply 1 application topically daily.    No facility-administered encounter medications on file as of 03/30/2021.    Functional Status:  In your present state of health, do you have any difficulty performing the following activities: 05/22/2020  Hearing? N  Vision? N  Difficulty concentrating or making decisions? Y  Walking or climbing stairs? Julie Mora  Dressing or bathing? Y  Doing errands, shopping? N  Some recent data might be hidden    Fall/Depression Screening:  PHQ 2/9 Scores 08/11/2020 08/08/2019 08/08/2019 06/18/2018 03/02/2017 01/14/2015 01/14/2015  PHQ - 2 Score 0 0 0 0 2 0 0  PHQ- 9 Score - - - - - - -    Assessment:  Care Plan There are no care plans that you recently modified to display for this patient.    Goals Addressed   None     No Follow-Up Required from LCSW.  Nat Christen, BSW, MSW,  LCSW  Licensed Education officer, [facility]  [address] [place] [address] [place], [place] [address] Physical Address-[address] [place], [place] [address] Toll Free Main # [phone] Fax # [phone] Cell # [phone]  [email or web address]

## 2021-03-30 NOTE — Telephone Encounter (Signed)
Spoke with representative at [employer] [phone]). She states a narrative office visit note stating necessities is the only portion they do not have for DME supplies.  OV note from 03/28/21 faxed to [phone], confirmation received.  Requested that further questions/concerns to reach this nurse at [phone] prior to noon today and after noon, to contact Yvonna Alanis at Dr. Verlene Mayer office.

## 2021-03-31 DIAGNOSIS — Z0289 Encounter for other administrative examinations: Secondary | ICD-10-CM

## 2021-03-31 NOTE — Telephone Encounter (Signed)
Note has been amended and fax to [employer]. Thank you

## 2021-03-31 NOTE — Telephone Encounter (Signed)
Faxed lab results, signed clarification orders and updated office notes, along with RX for DME equipment to [facility], [phone]

## 2021-03-31 NOTE — Telephone Encounter (Signed)
Faxed addend office note to [place] for DME equipment to be delivered to [facility], [phone]

## 2021-04-04 ENCOUNTER — Ambulatory Visit: Payer: Medicare PPO | Admitting: Dermatology

## 2021-04-06 ENCOUNTER — Telehealth: Payer: Self-pay | Admitting: Internal Medicine

## 2021-04-06 ENCOUNTER — Encounter: Payer: Self-pay | Admitting: Internal Medicine

## 2021-04-06 NOTE — Telephone Encounter (Signed)
Patient has moved into [facility], so her PCP is now Dr Fredderick Phenix with [facility] effective 04/05/2021. Dr Renold Genta has been removed as PCP

## 2021-06-14 ENCOUNTER — Ambulatory Visit: Payer: Medicare PPO | Admitting: Dermatology

## 2021-06-14 ENCOUNTER — Other Ambulatory Visit: Payer: Self-pay

## 2021-06-14 DIAGNOSIS — L304 Erythema intertrigo: Secondary | ICD-10-CM

## 2021-06-14 DIAGNOSIS — I890 Lymphedema, not elsewhere classified: Secondary | ICD-10-CM | POA: Diagnosis not present

## 2021-06-14 LAB — POCT SKIN KOH

## 2021-06-14 MED ORDER — TRIAMCINOLONE ACETONIDE 0.1 % EX CREA: 1.0000 "application " | TOPICAL_CREAM | Freq: Every day | CUTANEOUS | 6 refills | Status: DC

## 2021-06-28 ENCOUNTER — Encounter: Payer: Self-pay | Admitting: Dermatology

## 2021-06-28 NOTE — Progress Notes (Signed)
   Follow-Up Visit   Subjective  Julie Mora is a 80 y.o. female who presents for the following: Skin Problem (Patient here today for blisters on her legs, patient wants to know if she's using the correct cream to help with her legs to heal. ).  Patient actually more concerned with breaking out on chest than her chronic leg problem. Location:  Duration:  Quality:  Associated Signs/Symptoms: Modifying Factors:  Severity:  Timing: Context:   Objective  Well appearing patient in no apparent distress; mood and affect are within normal limits. Left Lower Leg - Anterior, Right Lower Leg - Anterior Elephantiasis no stroke changes on severely edematous bilateral lower extremities.  Julie Mora already is well aware of the regimen of this problem and the limited options available which she has already explored with her primary care doctor.  (She did recently choose to switch her primary care doctor).  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  There is mild diffuse erythema extending well below the inframammary breast increased suggestive of a mixture of irritant dermatitis plus inverse psoriasis.  There are no satellite lesions or pustules to suggest active Candida and KOH negative.    All skin waist up examined.  Plus legs.   Assessment & Plan    Lymphedema (2) Left Lower Leg - Anterior; Right Lower Leg - Anterior  No additional intervention initiated.  triamcinolone cream (KENALOG) 0.1 % - Left Lower Leg - Anterior, Right Lower Leg - Anterior Apply 1 application topically daily.  Erythema intertrigo Left Breast  May continue to use topical triamcinolone ideally on a less than daily basis.  She is aware of the possibility that this could produce thinning, stretch marks, or flare of her Candida.  Initial follow-up by by MyChart in 4 weeks.  POCT Skin KOH - Left Breast      I, Lavonna Monarch, MD, have reviewed all  documentation for this visit.  The documentation on 06/28/21 for the exam, diagnosis, procedures, and orders are all accurate and complete.

## 2021-09-18 ENCOUNTER — Other Ambulatory Visit: Payer: Self-pay | Admitting: Internal Medicine

## 2022-01-08 ENCOUNTER — Emergency Department (HOSPITAL_COMMUNITY): Payer: Medicare PPO

## 2022-01-08 ENCOUNTER — Inpatient Hospital Stay (HOSPITAL_COMMUNITY)
Admission: EM | Admit: 2022-01-08 | Discharge: 2022-01-17 | DRG: 871 | Disposition: A | Discharge status: Skilled Nursing Facility | Payer: Medicare PPO | Source: Skilled Nursing Facility | Attending: Family Medicine | Admitting: Family Medicine

## 2022-01-08 ENCOUNTER — Other Ambulatory Visit: Payer: Self-pay

## 2022-01-08 DIAGNOSIS — N179 Acute kidney failure, unspecified: Secondary | ICD-10-CM | POA: Diagnosis present

## 2022-01-08 DIAGNOSIS — L8990 Pressure ulcer of unspecified site, unspecified stage: Secondary | ICD-10-CM | POA: Insufficient documentation

## 2022-01-08 DIAGNOSIS — I129 Hypertensive chronic kidney disease with stage 1 through stage 4 chronic kidney disease, or unspecified chronic kidney disease: Secondary | ICD-10-CM | POA: Diagnosis present

## 2022-01-08 DIAGNOSIS — N390 Urinary tract infection, site not specified: Secondary | ICD-10-CM | POA: Diagnosis present

## 2022-01-08 DIAGNOSIS — Z6841 Body Mass Index (BMI) 40.0 and over, adult: Secondary | ICD-10-CM | POA: Diagnosis not present

## 2022-01-08 DIAGNOSIS — E860 Dehydration: Secondary | ICD-10-CM | POA: Diagnosis present

## 2022-01-08 DIAGNOSIS — I872 Venous insufficiency (chronic) (peripheral): Secondary | ICD-10-CM | POA: Diagnosis present

## 2022-01-08 DIAGNOSIS — S81809A Unspecified open wound, unspecified lower leg, initial encounter: Secondary | ICD-10-CM

## 2022-01-08 DIAGNOSIS — E876 Hypokalemia: Secondary | ICD-10-CM | POA: Diagnosis not present

## 2022-01-08 DIAGNOSIS — D631 Anemia in chronic kidney disease: Secondary | ICD-10-CM | POA: Diagnosis present

## 2022-01-08 DIAGNOSIS — E1140 Type 2 diabetes mellitus with diabetic neuropathy, unspecified: Secondary | ICD-10-CM | POA: Diagnosis present

## 2022-01-08 DIAGNOSIS — R6521 Severe sepsis with septic shock: Secondary | ICD-10-CM | POA: Diagnosis not present

## 2022-01-08 DIAGNOSIS — R6520 Severe sepsis without septic shock: Secondary | ICD-10-CM | POA: Diagnosis not present

## 2022-01-08 DIAGNOSIS — N3000 Acute cystitis without hematuria: Secondary | ICD-10-CM | POA: Diagnosis not present

## 2022-01-08 DIAGNOSIS — B961 Klebsiella pneumoniae [K. pneumoniae] as the cause of diseases classified elsewhere: Secondary | ICD-10-CM | POA: Diagnosis present

## 2022-01-08 DIAGNOSIS — G9340 Encephalopathy, unspecified: Secondary | ICD-10-CM

## 2022-01-08 DIAGNOSIS — Z66 Do not resuscitate: Secondary | ICD-10-CM | POA: Diagnosis present

## 2022-01-08 DIAGNOSIS — B372 Candidiasis of skin and nail: Secondary | ICD-10-CM

## 2022-01-08 DIAGNOSIS — G9341 Metabolic encephalopathy: Secondary | ICD-10-CM

## 2022-01-08 DIAGNOSIS — E861 Hypovolemia: Secondary | ICD-10-CM

## 2022-01-08 DIAGNOSIS — I959 Hypotension, unspecified: Principal | ICD-10-CM

## 2022-01-08 DIAGNOSIS — F0390 Unspecified dementia without behavioral disturbance: Secondary | ICD-10-CM | POA: Diagnosis present

## 2022-01-08 DIAGNOSIS — E1169 Type 2 diabetes mellitus with other specified complication: Secondary | ICD-10-CM

## 2022-01-08 DIAGNOSIS — A084 Viral intestinal infection, unspecified: Secondary | ICD-10-CM | POA: Diagnosis present

## 2022-01-08 DIAGNOSIS — Z888 Allergy status to other drugs, medicaments and biological substances status: Secondary | ICD-10-CM

## 2022-01-08 DIAGNOSIS — Z794 Long term (current) use of insulin: Secondary | ICD-10-CM | POA: Diagnosis not present

## 2022-01-08 DIAGNOSIS — N1832 Chronic kidney disease, stage 3b: Secondary | ICD-10-CM | POA: Diagnosis present

## 2022-01-08 DIAGNOSIS — E785 Hyperlipidemia, unspecified: Secondary | ICD-10-CM | POA: Diagnosis present

## 2022-01-08 DIAGNOSIS — R509 Fever, unspecified: Secondary | ICD-10-CM

## 2022-01-08 DIAGNOSIS — A419 Sepsis, unspecified organism: Secondary | ICD-10-CM | POA: Diagnosis present

## 2022-01-08 DIAGNOSIS — Z7982 Long term (current) use of aspirin: Secondary | ICD-10-CM

## 2022-01-08 DIAGNOSIS — Z20822 Contact with and (suspected) exposure to covid-19: Secondary | ICD-10-CM | POA: Diagnosis present

## 2022-01-08 DIAGNOSIS — E119 Type 2 diabetes mellitus without complications: Secondary | ICD-10-CM

## 2022-01-08 DIAGNOSIS — I9589 Other hypotension: Secondary | ICD-10-CM | POA: Diagnosis not present

## 2022-01-08 DIAGNOSIS — Z8673 Personal history of transient ischemic attack (TIA), and cerebral infarction without residual deficits: Secondary | ICD-10-CM

## 2022-01-08 DIAGNOSIS — N289 Disorder of kidney and ureter, unspecified: Secondary | ICD-10-CM | POA: Diagnosis present

## 2022-01-08 DIAGNOSIS — I509 Heart failure, unspecified: Secondary | ICD-10-CM | POA: Diagnosis not present

## 2022-01-08 DIAGNOSIS — E1122 Type 2 diabetes mellitus with diabetic chronic kidney disease: Secondary | ICD-10-CM | POA: Diagnosis present

## 2022-01-08 DIAGNOSIS — Z79899 Other long term (current) drug therapy: Secondary | ICD-10-CM

## 2022-01-08 DIAGNOSIS — I890 Lymphedema, not elsewhere classified: Secondary | ICD-10-CM | POA: Diagnosis present

## 2022-01-08 DIAGNOSIS — Z9049 Acquired absence of other specified parts of digestive tract: Secondary | ICD-10-CM

## 2022-01-08 LAB — CBC WITH DIFFERENTIAL/PLATELET
Abs Immature Granulocytes: 0.08 10*3/uL — ABNORMAL HIGH (ref 0.00–0.07)
Basophils Absolute: 0 10*3/uL (ref 0.0–0.1)
Basophils Relative: 0 %
Eosinophils Absolute: 0.2 10*3/uL (ref 0.0–0.5)
Eosinophils Relative: 2 %
HCT: 43.9 % (ref 36.0–46.0)
Hemoglobin: 13.7 g/dL (ref 12.0–15.0)
Immature Granulocytes: 1 %
Lymphocytes Relative: 9 %
Lymphs Abs: 0.8 10*3/uL (ref 0.7–4.0)
MCH: 31.4 pg (ref 26.0–34.0)
MCHC: 31.2 g/dL (ref 30.0–36.0)
MCV: 100.7 fL — ABNORMAL HIGH (ref 80.0–100.0)
Monocytes Absolute: 0.5 10*3/uL (ref 0.1–1.0)
Monocytes Relative: 5 %
Neutro Abs: 7.4 10*3/uL (ref 1.7–7.7)
Neutrophils Relative %: 83 %
Platelets: 241 10*3/uL (ref 150–400)
RBC: 4.36 MIL/uL (ref 3.87–5.11)
RDW: 15.1 % (ref 11.5–15.5)
WBC: 9 10*3/uL (ref 4.0–10.5)
nRBC: 0 % (ref 0.0–0.2)

## 2022-01-08 LAB — GLUCOSE, CAPILLARY: Glucose-Capillary: 93 mg/dL (ref 70–99)

## 2022-01-08 LAB — URINALYSIS, ROUTINE W REFLEX MICROSCOPIC
Bilirubin Urine: NEGATIVE
Glucose, UA: NEGATIVE mg/dL
Ketones, ur: NEGATIVE mg/dL
Nitrite: POSITIVE — AB
Protein, ur: 30 mg/dL — AB
RBC / HPF: >50 RBC/hpf — ABNORMAL HIGH (ref 0–5)
Specific Gravity, Urine: 1.025 (ref 1.005–1.030)
Squamous Epithelial / HPF: >50 — ABNORMAL HIGH (ref 0–5)
WBC, UA: >50 WBC/hpf — ABNORMAL HIGH (ref 0–5)
pH: 6 (ref 5.0–8.0)

## 2022-01-08 LAB — COMPREHENSIVE METABOLIC PANEL
ALT: 17 U/L (ref 0–44)
AST: 74 U/L — ABNORMAL HIGH (ref 15–41)
Albumin: 1.8 g/dL — ABNORMAL LOW (ref 3.5–5.0)
Alkaline Phosphatase: 75 U/L (ref 38–126)
Anion gap: 5 (ref 5–15)
BUN: 39 mg/dL — ABNORMAL HIGH (ref 8–23)
CO2: 24 mmol/L (ref 22–32)
Calcium: 8 mg/dL — ABNORMAL LOW (ref 8.9–10.3)
Chloride: 111 mmol/L (ref 98–111)
Creatinine, Ser: 1.69 mg/dL — ABNORMAL HIGH (ref 0.44–1.00)
GFR, Estimated: 30 mL/min — ABNORMAL LOW (ref >60)
Glucose, Bld: 84 mg/dL (ref 70–99)
Potassium: 4.5 mmol/L (ref 3.5–5.1)
Sodium: 140 mmol/L (ref 135–145)
Total Bilirubin: 0.8 mg/dL (ref 0.3–1.2)
Total Protein: 5.9 g/dL — ABNORMAL LOW (ref 6.5–8.1)

## 2022-01-08 LAB — RESP PANEL BY RT-PCR (FLU A&B, COVID) ARPGX2
Influenza A by PCR: NEGATIVE
Influenza B by PCR: NEGATIVE
SARS Coronavirus 2 by RT PCR: NEGATIVE

## 2022-01-08 LAB — CBG MONITORING, ED: Glucose-Capillary: 77 mg/dL (ref 70–99)

## 2022-01-08 LAB — LACTIC ACID, PLASMA
Lactic Acid, Venous: 1.3 mmol/L (ref 0.5–1.9)
Lactic Acid, Venous: 1.1 mmol/L (ref 0.5–1.9)

## 2022-01-08 LAB — PROTIME-INR
INR: 9.4 (ref 0.8–1.2)
INR: 1.2 (ref 0.8–1.2)
Prothrombin Time: 75.1 seconds — ABNORMAL HIGH (ref 11.4–15.2)
Prothrombin Time: 15 seconds (ref 11.4–15.2)

## 2022-01-08 LAB — TROPONIN I (HIGH SENSITIVITY)
Troponin I (High Sensitivity): 16 ng/L (ref <18)
Troponin I (High Sensitivity): 16 ng/L (ref <18)

## 2022-01-08 LAB — APTT: aPTT: >200 seconds (ref 24–36)

## 2022-01-08 MED ORDER — ACETAMINOPHEN 325 MG PO TABS
650.0000 mg | ORAL_TABLET | Freq: Four times a day (QID) | ORAL | Status: DC | PRN
  Administered 2022-01-11 – 2022-01-15 (×3): 650 mg via ORAL
  Filled 2022-01-08 (×3): qty 2

## 2022-01-08 MED ORDER — SODIUM CHLORIDE 0.9 % IV SOLN: 1.0000 g | Freq: Every day | INTRAVENOUS | Status: DC

## 2022-01-08 MED ORDER — VANCOMYCIN HCL 1250 MG/250ML IV SOLN: 1250.0000 mg | INTRAVENOUS | Status: DC

## 2022-01-08 MED ORDER — ENOXAPARIN SODIUM 40 MG/0.4ML IJ SOSY
40.0000 mg | PREFILLED_SYRINGE | INTRAMUSCULAR | Status: DC
  Administered 2022-01-08 – 2022-01-16 (×9): 40 mg via SUBCUTANEOUS
  Filled 2022-01-08 (×9): qty 0.4

## 2022-01-08 MED ORDER — LACTATED RINGERS IV BOLUS
1000.0000 mL | Freq: Once | INTRAVENOUS | Status: AC
Start: 2022-01-08 — End: 2022-01-08
  Administered 2022-01-08: 1000 mL via INTRAVENOUS

## 2022-01-08 MED ORDER — ORAL CARE MOUTH RINSE
15.0000 mL | Freq: Two times a day (BID) | OROMUCOSAL | Status: DC
  Administered 2022-01-08 – 2022-01-17 (×13): 15 mL via OROMUCOSAL

## 2022-01-08 MED ORDER — METRONIDAZOLE 500 MG/100ML IV SOLN
500.0000 mg | Freq: Once | INTRAVENOUS | Status: AC
  Administered 2022-01-08: 500 mg via INTRAVENOUS
  Filled 2022-01-08: qty 100

## 2022-01-08 MED ORDER — INSULIN ASPART 100 UNIT/ML IJ SOLN
0.0000 [IU] | Freq: Three times a day (TID) | INTRAMUSCULAR | Status: DC
  Filled 2022-01-08: qty 0.09

## 2022-01-08 MED ORDER — LACTATED RINGERS IV BOLUS (SEPSIS)
1000.0000 mL | Freq: Once | INTRAVENOUS | Status: AC
Start: 2022-01-08 — End: 2022-01-08
  Administered 2022-01-08: 1000 mL via INTRAVENOUS

## 2022-01-08 MED ORDER — SODIUM CHLORIDE 0.9 % IV SOLN: 2.0000 g | Freq: Every day | INTRAVENOUS | Status: DC

## 2022-01-08 MED ORDER — SODIUM CHLORIDE 0.9 % IV SOLN
2.0000 g | Freq: Once | INTRAVENOUS | Status: AC
  Administered 2022-01-08: 2 g via INTRAVENOUS

## 2022-01-08 MED ORDER — METRONIDAZOLE 500 MG/100ML IV SOLN
500.0000 mg | Freq: Two times a day (BID) | INTRAVENOUS | Status: DC
  Administered 2022-01-09: 500 mg via INTRAVENOUS
  Filled 2022-01-08: qty 100

## 2022-01-08 MED ORDER — VANCOMYCIN HCL 2000 MG/400ML IV SOLN
2000.0000 mg | Freq: Once | INTRAVENOUS | Status: AC
  Administered 2022-01-08: 2000 mg via INTRAVENOUS
  Filled 2022-01-08: qty 400

## 2022-01-08 MED ORDER — VANCOMYCIN HCL IN DEXTROSE 1-5 GM/200ML-% IV SOLN
1000.0000 mg | Freq: Once | INTRAVENOUS | Status: DC
Start: 2022-01-08 — End: 2022-01-08

## 2022-01-08 MED ORDER — ACETAMINOPHEN 650 MG RE SUPP
650.0000 mg | Freq: Once | RECTAL | Status: AC
  Administered 2022-01-08: 650 mg via RECTAL
  Filled 2022-01-08: qty 1

## 2022-01-08 MED ORDER — ZINC OXIDE 40 % EX OINT
TOPICAL_OINTMENT | Freq: Two times a day (BID) | CUTANEOUS | Status: DC
  Administered 2022-01-11 – 2022-01-12 (×2): 1 via TOPICAL
  Filled 2022-01-08 (×3): qty 57

## 2022-01-08 MED ORDER — IBUPROFEN 200 MG PO TABS
400.0000 mg | ORAL_TABLET | Freq: Once | ORAL | Status: AC
  Administered 2022-01-08: 400 mg via ORAL
  Filled 2022-01-08: qty 2

## 2022-01-08 MED ORDER — NYSTATIN 100000 UNIT/GM EX CREA
TOPICAL_CREAM | Freq: Two times a day (BID) | CUTANEOUS | Status: DC
  Administered 2022-01-11: 1 via TOPICAL
  Filled 2022-01-08 (×2): qty 30

## 2022-01-08 MED ORDER — SODIUM CHLORIDE 0.9 % IV SOLN
2.0000 g | Freq: Once | INTRAVENOUS | Status: DC
Start: 2022-01-08 — End: 2022-01-08
  Filled 2022-01-08: qty 12.5

## 2022-01-08 MED ORDER — CEFTRIAXONE SODIUM 2 G IJ SOLR
2.0000 g | Freq: Every day | INTRAMUSCULAR | Status: DC
  Administered 2022-01-09 – 2022-01-12 (×4): 2 g via INTRAVENOUS
  Filled 2022-01-08 (×5): qty 20

## 2022-01-08 NOTE — ED Notes (Signed)
Pt's sister at bedside.

## 2022-01-08 NOTE — Assessment & Plan Note (Signed)
Due to persistent diarrhea  ?-she is fluid responsive.  Continues aggressive IV fluid hydration ?

## 2022-01-08 NOTE — ED Provider Notes (Signed)
?[facility] DEPT ?Provider Note ? ? ?CSN: [id number] ?Arrival date & time: 01/08/22  1616 ? ?  ? ?History ? ?Chief Complaint  ?Patient presents with  ? Altered Mental Status  ? ? ?Julie Mora is a 81 y.o. female. ? ?81 year old female brought in by EMS from [facility], assisted living facility.  Patient's sister came to visit today, concern for change in mental status, EMS was called.  Upon arrival, patient was found to have a blood glucose of 61 with EMS, given juice with improvement to 108.  Found to have O2 of 80% on room air, placed on nasal cannula with improvement to the upper 90s.  Reported to have blood pressure as low as 93/43, heart rate 100 with report of weak radial pulses.  Also tachypneic with respiratory rate of 32.  Facility reported patient has had diarrhea for the past 3 days, known GI virus in the facility. ?Patient goes to wound care for bilateral lower extremity wounds, arrives with legs dressed, dressings clean dry and intact.  Also known skin yeast infection under bilateral breasts as well as lower abdomen.  Also on Bactrim, recently started, sister noted decrease in urine output and concerned about renal function. ? ?Past medical history of diabetes, hypertension, hyperlipidemia, renal insufficiency.  Report of dementia. ? ?Patient is alert to name and location, otherwise denies complaints and not able to accurately answer questions. ? ? ?  ? ?Home Medications ?Prior to Admission medications   ?Medication Sig Start Date End Date Taking? Authorizing Provider  ?ACCU-CHEK AVIVA PLUS test strip USE TO CHECK BLOOD SUGAR BEFORE BREAKFAST AND SUPPER 11/09/14   Elby Showers, MD  ?acetaminophen (TYLENOL) 500 MG tablet Take 500 mg by mouth every 6 (six) hours as needed for pain.    [provider]  ?ASPIRIN 81 PO Take by mouth.    [provider]  ?BD PEN NEEDLE NANO U/F 32G X 4 MM MISC USE AS DIRECTED 01/11/15   Elby Showers, MD  ?collagenase  (SANTYL) ointment Apply topically daily. To both legs 05/26/20   Mercy Riding, MD  ?ferrous gluconate (FERGON) 324 MG tablet TAKE 1 TABLET(324 MG) BY MOUTH DAILY WITH BREAKFAST 06/28/20   Elby Showers, MD  ?gabapentin (NEURONTIN) 100 MG capsule Take 100 mg by mouth 3 (three) times daily.    [provider]  ?insulin aspart (NOVOLOG) 100 UNIT/ML injection 0-15 Units, Subcutaneous, 3 times daily with meals, First dose on Sat 05/22/20 at 1700 ?Correction coverage: Moderate (average weight, post-op) ?CBG < 70: Implement Hypoglycemia Standing Orders and refer to Hypoglycemia Standing Orders sidebar report ?CBG 70 - 120: 0 units ?CBG 121 - 150: 2 units ?CBG 151 - 200: 3 units ?CBG 201 - 250: 5 units ?CBG 251 - 300: 8 units ?CBG 301 - 350: 11 units ?CBG 351 - 400: 15 units ?CBG > 400: call MD 05/26/20   Mercy Riding, MD  ?insulin glargine (LANTUS SOLOSTAR) 100 UNIT/ML Solostar Pen Inject 20 Units into the skin daily. 05/26/20   Mercy Riding, MD  ?magnesium 30 MG tablet Take 30 mg by mouth daily.    [provider]  ?prednisoLONE acetate (PRED FORTE) 1 % ophthalmic suspension Place 1 drop into both eyes in the morning and at bedtime.    [provider]  ?simvastatin (ZOCOR) 20 MG tablet Take 1 tablet (20 mg total) by mouth at bedtime. 06/28/20   Elby Showers, MD  ?triamcinolone cream (KENALOG) 0.1 %  Apply 1 application topically daily. 06/14/21   Lavonna Monarch, MD  ?   ? ?Allergies    ?Azathioprine, Cellcept [mycophenolate mofetil], and Ramipril   ? ?Review of Systems   ?Review of Systems  ?Unable to perform ROS: Dementia  ? ?Physical Exam ?Updated Vital Signs ?BP 112/81   Pulse (!) 136   Resp (!) 28   Ht '5\' 2"'$  (1.575 m)   Wt 113 kg   LMP  (LMP Unknown)   SpO2 90%   BMI 45.56 kg/m?  ?Physical Exam ?Vitals and nursing note reviewed.  ?Constitutional:   ?   General: She is not in acute distress. ?   Appearance: She is well-developed. She is not diaphoretic.  ?HENT:  ?   Head: Normocephalic and  atraumatic.  ?   Nose: Nose normal.  ?   Mouth/Throat:  ?   Mouth: Mucous membranes are moist.  ?Eyes:  ?   Pupils: Pupils are equal, round, and reactive to light.  ?Cardiovascular:  ?   Rate and Rhythm: Regular rhythm. Tachycardia present.  ?   Heart sounds: Normal heart sounds.  ?Pulmonary:  ?   Effort: Pulmonary effort is normal.  ?   Breath sounds: Normal breath sounds.  ?Abdominal:  ?   Palpations: Abdomen is soft.  ?   Tenderness: There is no abdominal tenderness.  ?Musculoskeletal:     ?   General: Tenderness present. No deformity or signs of injury.  ?Skin: ?   Findings: Erythema present.  ?   Comments: Erythema/likely yeast below bilateral breasts, under panus  ?Neurological:  ?   General: No focal deficit present.  ?   Mental Status: She is alert.  ?Psychiatric:     ?   Behavior: Behavior normal.  ? ? ? ? ? ?ED Results / Procedures / Treatments   ?Labs ?(all labs ordered are listed, but only abnormal results are displayed) ?Labs Reviewed  ?CBC WITH DIFFERENTIAL/PLATELET - Abnormal; Notable for the following components:  ?    Result Value  ? MCV 100.7 (*)   ? Abs Immature Granulocytes 0.08 (*)   ? All other components within normal limits  ?CULTURE, BLOOD (ROUTINE X 2)  ?CULTURE, BLOOD (ROUTINE X 2)  ?URINE CULTURE  ?RESP PANEL BY RT-PCR (FLU A&B, COVID) ARPGX2  ?GASTROINTESTINAL PANEL BY PCR, STOOL (REPLACES STOOL CULTURE)  ?C DIFFICILE QUICK SCREEN W PCR REFLEX    ?LACTIC ACID, PLASMA  ?LACTIC ACID, PLASMA  ?COMPREHENSIVE METABOLIC PANEL  ?PROTIME-INR  ?APTT  ?URINALYSIS, ROUTINE W REFLEX MICROSCOPIC  ?TROPONIN I (HIGH SENSITIVITY)  ? ? ?EKG ?EKG Interpretation ? ?Date/Time:  Sunday January 08 2022 17:08:34 EDT ?Ventricular Rate:  128 ?PR Interval:  129 ?QRS Duration: 97 ?QT Interval:  307 ?QTC Calculation: 448 ?R Axis:   -10 ?Text Interpretation: Sinus tachycardia LAD, consider LAFB or inferior infarct Anterolateral infarct, age indeterminate ST elevation, consider inferior injury Confirmed by Dene Gentry [phone]) on 01/08/2022 5:11:26 PM ? ?Radiology ?No results found. ? ?Procedures ?Marland KitchenCritical Care ?Performed by: Tacy Learn, PA-C ?Authorized by: Tacy Learn, PA-C  ? ?Critical care provider statement:  ?  Critical care time (minutes):  30 ?  Critical care was time spent personally by me on the following activities:  Development of treatment plan with patient or surrogate, discussions with consultants, evaluation of patient's response to treatment, examination of patient, ordering and review of laboratory studies, ordering and review of radiographic studies, ordering and performing treatments and interventions, pulse oximetry, re-evaluation of patient's  condition and review of old charts  ? ? ?Medications Ordered in ED ?Medications  ?metroNIDAZOLE (FLAGYL) IVPB 500 mg (500 mg Intravenous New Bag/Given 01/08/22 1725)  ?ceFEPIme (MAXIPIME) 2 g in sodium chloride 0.9 % 100 mL IVPB (2 g Intravenous New Bag/Given 01/08/22 1721)  ?vancomycin (VANCOREADY) IVPB 2000 mg/400 mL (has no administration in time range)  ?lactated ringers bolus 1,000 mL (1,000 mLs Intravenous New Bag/Given 01/08/22 1658)  ? ? ?ED Course/ Medical Decision Making/ A&P ?  ?                        ?Medical Decision Making ?Amount and/or Complexity of Data Reviewed ?Labs: ordered. ?Radiology: ordered. ?ECG/medicine tests: ordered. ? ?Risk ?Prescription drug management. ? ? ?This patient presents to the ED for concern of altered mental status, concern for sepsis, this involves an extensive number of treatment options, and is a complaint that carries with it a high risk of complications and morbidity.  The differential diagnosis includes renal failure, UTI, sepsis, CVA, cellulitis  ? ? ?Co morbidities that complicate the patient evaluation ? ?Obesity, renal insufficiency, diabetes, hypertension, dementia ? ? ?Additional history obtained: ? ?Additional history obtained from EMS as per HPI ?External records from outside source obtained and reviewed  including prior Cr 1.11, GFR 51 on 03/28/21 ? ? ?Lab Tests: ? ?I Ordered, and personally interpreted labs.  The pertinent results include:  WBC normal, remaining labs pending at time of signout ? ? ?Imaging Stud

## 2022-01-08 NOTE — Assessment & Plan Note (Signed)
Likely secondary to UTI and GI infection ?-Considering how ill appearing she is, will continue broad-spectrum antibiotics with IV vancomycin, Rocephin and Flagyl pending blood culture ?-C.diff and GI stool panel pending  ?-continue 30cc/kg sepsis fluid bolus and aggressive IV hydration overnight ?

## 2022-01-08 NOTE — Assessment & Plan Note (Signed)
Secondary to sepsis and hypotension.  Sister reports baseline dementia but usually oriented to self.  Today only oriented to place ?

## 2022-01-08 NOTE — Progress Notes (Signed)
Pharmacy Note  ? ?A consult was received from an ED physician for vancomycin and cefepime per pharmacy dosing. ?   ?The patient's profile has been reviewed for ht/wt/allergies/indication/available labs.   ? ?A one time order has been placed for vancomycin 2000 mg IV x1 and cefepime 2 gr IV x1 .   ? ?Further antibiotics/pharmacy consults should be ordered by admitting physician if indicated.       ?                ?Thank you, ? ?Royetta Asal, PharmD, BCPS ?01/08/2022 4:54 PM ? ?

## 2022-01-08 NOTE — Assessment & Plan Note (Addendum)
Baseline regimen of 20 units Lantus qHS and sliding scale insulin ?-start sensitive sliding scale AC and HS ?

## 2022-01-08 NOTE — ED Triage Notes (Signed)
Patient BIB EMS from [facility] c/o [facility]. Per report pt had diarrhea x3 days.Per report pt was 80% on RA. 2L O2 started by EMS. Denies N/V and Fever.  ? ?BP 90/60 ?HR 112 ?RR 33 ?O2sat 95% on 2L. ?

## 2022-01-08 NOTE — H&P (Signed)
?History and Physical  ? ? ?Patient: Julie Mora QHU:[id number] DOB: Sep 02, 1941 ?DOA: 01/08/2022 ?DOS: the patient was seen and examined on 01/08/2022 ?PCP: System, Provider Not In  ?Patient coming from: ALF/ILF ? ?Chief Complaint:  ?Chief Complaint  ?Patient presents with  ? Altered Mental Status  ? ?HPI: Julie Mora is a 81 y.o. female with medical history significant of CVA, HTN, Type 2 diabetes, HLD who presents from ALF at request of sister for AMS.  ? ?Sister at bedside provides history as patient is lethargic and oriented only to place at this time.  She requested patient be brought to the hospital today due to altered mental status and being confused.  She last saw her normal about 4 days ago.  However over the weekend patient has been having profuse diarrhea with known GI infection in the facility.  Patient is also been on Bactrim for the past 2 days for possible lower extremity wound and UTI.  She has been following wound care outpatient for bilateral lower leg with chronic lymphedema. ? ?In the ED, she was febrile 101.41F, tachypnea and tachycardiac with initial hypotension 80/50 that improved with fluids. No leukocytosis or anemia. AKI with creatinine of 1.69 from 1.11.  ?UA +nitrate, moderate leukocytosis and >50 RBC, >50 WBC and many bacteria.  ?Review of Systems: unable to review all systems due to the inability of the patient to answer questions. ?Past Medical History:  ?Diagnosis Date  ? Anemia   ? Essential hypertension, benign   ? Incontinence   ? Other and unspecified hyperlipidemia   ? Pemphigus foliaceous   ? Renal insufficiency   ? Type II or unspecified type diabetes mellitus without mention of complication, not stated as uncontrolled   ? Vitamin D deficiency   ? ?Past Surgical History:  ?Procedure Laterality Date  ? CHOLECYSTECTOMY N/A 06/08/2017  ? Procedure: LAPAROSCOPIC CHOLECYSTECTOMY;  Surgeon: Kinsinger, Arta Bruce, MD;  Location: [facility];  Service: General;  Laterality: N/A;  ? ?Social  History:  reports that she has never smoked. She has never used smokeless tobacco. She reports that she does not drink alcohol and does not use drugs. ? ?Allergies  ?Allergen Reactions  ? Azathioprine Shortness Of Breath and Other (See Comments)  ?  severly allergic. nausea, diarrhea, low blood pressure, fever, chills, rash. was hospitalized due to side effects in feb 2012.  ? Cellcept [Mycophenolate Mofetil] Nausea Only  ?  severe nausea  ? Ramipril Cough  ? ? ?Family History  ?Problem Relation Age of Onset  ? Pancreatic cancer Father 22  ? Dementia Mother 58  ? ? ?Prior to Admission medications   ?Medication Sig Start Date End Date Taking? Authorizing Provider  ?ACCU-CHEK AVIVA PLUS test strip USE TO CHECK BLOOD SUGAR BEFORE BREAKFAST AND SUPPER 11/09/14   Elby Showers, MD  ?acetaminophen (TYLENOL) 500 MG tablet Take 500 mg by mouth every 6 (six) hours as needed for pain.    [provider]  ?ASPIRIN 81 PO Take by mouth.    [provider]  ?BD PEN NEEDLE NANO U/F 32G X 4 MM MISC USE AS DIRECTED 01/11/15   Elby Showers, MD  ?collagenase (SANTYL) ointment Apply topically daily. To both legs 05/26/20   Mercy Riding, MD  ?ferrous gluconate (FERGON) 324 MG tablet TAKE 1 TABLET(324 MG) BY MOUTH DAILY WITH BREAKFAST 06/28/20   Elby Showers, MD  ?gabapentin (NEURONTIN) 100 MG capsule Take 100 mg by mouth 3 (three) times daily.    [provider]  ?insulin aspart (NOVOLOG) 100 UNIT/ML injection 0-15 Units, Subcutaneous, 3 times daily with meals, First dose on Sat 05/22/20 at 1700 ?Correction coverage: Moderate (average weight, post-op) ?CBG < 70: Implement Hypoglycemia Standing Orders and refer to Hypoglycemia Standing Orders sidebar report ?CBG 70 - 120: 0 units ?CBG 121 - 150: 2 units ?CBG 151 - 200: 3 units ?CBG 201 - 250: 5 units ?CBG 251 - 300: 8 units ?CBG 301 - 350: 11 units ?CBG 351 - 400: 15 units ?CBG > 400: call MD 05/26/20   Mercy Riding, MD  ?insulin glargine (LANTUS SOLOSTAR) 100  UNIT/ML Solostar Pen Inject 20 Units into the skin daily. 05/26/20   Mercy Riding, MD  ?magnesium 30 MG tablet Take 30 mg by mouth daily.    [provider]  ?prednisoLONE acetate (PRED FORTE) 1 % ophthalmic suspension Place 1 drop into both eyes in the morning and at bedtime.    [provider]  ?simvastatin (ZOCOR) 20 MG tablet Take 1 tablet (20 mg total) by mouth at bedtime. 06/28/20   Elby Showers, MD  ?triamcinolone cream (KENALOG) 0.1 % Apply 1 application topically daily. 06/14/21   Lavonna Monarch, MD  ? ? ?Physical Exam: ?Vitals:  ? 01/08/22 1830 01/08/22 1845 01/08/22 1928 01/08/22 1930  ?BP: (!) 95/50 104/60  106/68  ?Pulse: (!) 130 (!) 105  (!) 115  ?Resp: (!) 23 (!) 26  (!) 23  ?Temp:   (!) 101.4 ?F (38.6 ?C)   ?TempSrc:   Rectal   ?SpO2: 96% 95%  100%  ?Weight:      ?Height:      ? ?Constitutional: Lethargic ill-appearing elderly female laying flat in bed asleep ?Eyes: Interjected bilateral conjunctiva ?ENMT: Dry cracking lips on exam.   ?Neck: normal, supple ?Respiratory: clear to auscultation bilaterally, no wheezing, no crackles. Normal respiratory effort on 2L via [place] ?Cardiovascular: Regular rate and rhythm, no murmurs / rubs / gallops.  Trace bilateral nonpitting edema.   ?Abdomen: Soft, nondistended and nontender, easily reducible umbilical hernia  ?musculoskeletal: no clubbing / cyanosis. No joint deformity upper and lower extremities. ?Back: Erythematous bilateral buttocks and sacral region without tearing of skin ?Skin: Moist erythematous rashes below bilateral breast fold and abdominal pannus ? dry erythematous scaly large patches to mid to lower back. ?Did not personally visualize bilateral lower extremity edema since this was wrapped in clean and intact dressing by ER physician.  Photos attached below. ? ? ? ? ? ? ? ?Neurologic: CN 2-12 grossly intact.  Patient alert and oriented only to place. ?Psychiatric: Unable to assess due to altered mental status ?Data  Reviewed: ? ?See HPI ? ?Assessment and Plan: ?* Sepsis ([place]) ?Likely secondary to UTI and GI infection ?-Considering how ill appearing she is, will continue broad-spectrum antibiotics with IV vancomycin, Rocephin and Flagyl pending blood culture ?-C.diff and GI stool panel pending  ?-continue 30cc/kg sepsis fluid bolus and aggressive IV hydration overnight ? ?Hypotension ?Due to persistent diarrhea  ?-she is fluid responsive.  Continues aggressive IV fluid hydration ? ?Acute metabolic encephalopathy ?Secondary to sepsis and hypotension.  Sister reports baseline dementia but usually oriented to self.  Today only oriented to place ? ?AKI (acute kidney injury) ([place]) ?Secondary to dehydration from diarrhea ?- Creatinine elevated 1.69 from 1.11. ?-Continue IV fluid hydration and monitor with repeat in the morning ?-Avoid nephrotoxic agent ? ?Yeast infection of the skin ?Beneath bilateral breasts folds and abdominal pannus ?-Nystatin and Desitin cream ? ?Wound of  lower extremity ?Bilateral lower extremity wounds due to lymphedema/chronic venous stasis changes.  Follows with wound care outpatient. ?Daily wound care per RN ? ?Diabetes mellitus ([place]) ?Baseline regimen of 20 units Lantus qHS and sliding scale insulin ?-start sensitive sliding scale AC and HS ? ? ? ? ? Advance Care Planning:   Code Status: Partial Code no intubation-verified with sister who is HCPOA ? ?Consults: NONE ? ?Family Communication: Discussed with sister at bedside ? ?Severity of Illness: ?The appropriate patient status for this patient is INPATIENT. Inpatient status is judged to be reasonable and necessary in order to provide the required intensity of service to ensure the patient's safety. The patient's presenting symptoms, physical exam findings, and initial radiographic and laboratory data in the context of their chronic comorbidities is felt to place them at high risk for further clinical deterioration. Furthermore, it is not anticipated that the  patient will be medically stable for discharge from the hospital within 2 midnights of admission.  ? ?* I certify that at the point of admission it is my clinical judgment that the patient will require inpatient h

## 2022-01-08 NOTE — Progress Notes (Signed)
Pharmacy Antibiotic Note ? ?Julie Mora is a 81 y.o. female admitted on 01/08/2022 with  wound infection .  Pharmacy has been consulted for vancomycin dosing. ? ?Sepsis is likely due to GI infection vs UTI.  ? ?Plan: ?Vancomycin 2000 mg IV x1 then 1250 mg IV q48 ( AUC 507, SCr 1.69, Wt 113 kg ) ?Ceftriaxone 2 gr IV q24h per MD  ?Metronidazole 500 mg IV q12h per MD  ? ? ?Height: '5\' 2"'$  (157.5 cm) ?Weight: 113 kg (249 lb 1.9 oz) ?IBW/kg (Calculated) : 50.1 ? ?Temp (24hrs), Avg:101.3 ?F (38.5 ?C), Min:101.2 ?F (38.4 ?C), Max:101.4 ?F (38.6 ?C) ? ?Recent Labs  ?Lab 01/08/22 ?1650 01/08/22 ?1851  ?WBC 9.0  --   ?CREATININE 1.69*  --   ?LATICACIDVEN  --  1.3  ?  ?Estimated Creatinine Clearance: 31.6 mL/min (A) (by C-G formula based on SCr of 1.69 mg/dL (H)).   ? ?Allergies  ?Allergen Reactions  ? Azathioprine Shortness Of Breath and Other (See Comments)  ?  severly allergic. nausea, diarrhea, low blood pressure, fever, chills, rash. was hospitalized due to side effects in feb 2012.  ? Cellcept [Mycophenolate Mofetil] Nausea Only  ?  severe nausea  ? Ramipril Cough  ? ? ?Antimicrobials this admission: ?4/23 cefepime x  1 ?4/23 vancomycin  >>  ?4/23 Ceftriaxone >>  ?4/23 metronidazole >>  ? ?Dose adjustments this admission: ? ? ?Microbiology results: ?4/23  BCx:  ?4/23 UCx:  ?4/23 GI PCR:  ?4/23: C.diff:  ?4/23 Resp Panel:   ?  ? ?Thank you for allowing pharmacy to be a part of this patient?s care. ? ?Royetta Asal, PharmD, BCPS ?01/08/2022 8:05 PM ? ? ?

## 2022-01-08 NOTE — Assessment & Plan Note (Addendum)
Bilateral lower extremity wounds due to lymphedema/chronic venous stasis changes.  Follows with wound care outpatient. ?Daily wound care per RN ?

## 2022-01-08 NOTE — Sepsis Progress Note (Signed)
Sepsis protocol is being followed by [facility]. 

## 2022-01-08 NOTE — Assessment & Plan Note (Addendum)
Beneath bilateral breasts folds and abdominal pannus ?-Nystatin and Desitin cream ?

## 2022-01-08 NOTE — Assessment & Plan Note (Signed)
Secondary to dehydration from diarrhea ?- Creatinine elevated 1.69 from 1.11. ?-Continue IV fluid hydration and monitor with repeat in the morning ?-Avoid nephrotoxic agent ?

## 2022-01-08 NOTE — Sepsis Progress Note (Signed)
Secure chat sent to bedside nurse regarding lactic acid that has not resulted. It was collected at 1654. ?

## 2022-01-09 ENCOUNTER — Encounter (HOSPITAL_COMMUNITY): Payer: Self-pay | Admitting: Family Medicine

## 2022-01-09 DIAGNOSIS — L8990 Pressure ulcer of unspecified site, unspecified stage: Secondary | ICD-10-CM | POA: Insufficient documentation

## 2022-01-09 DIAGNOSIS — N390 Urinary tract infection, site not specified: Secondary | ICD-10-CM

## 2022-01-09 DIAGNOSIS — G9341 Metabolic encephalopathy: Secondary | ICD-10-CM | POA: Diagnosis not present

## 2022-01-09 DIAGNOSIS — A419 Sepsis, unspecified organism: Secondary | ICD-10-CM | POA: Diagnosis not present

## 2022-01-09 DIAGNOSIS — R6520 Severe sepsis without septic shock: Secondary | ICD-10-CM | POA: Diagnosis not present

## 2022-01-09 LAB — PROCALCITONIN: Procalcitonin: 0.92 ng/mL

## 2022-01-09 LAB — CBC
HCT: 34.7 % — ABNORMAL LOW (ref 36.0–46.0)
Hemoglobin: 11.1 g/dL — ABNORMAL LOW (ref 12.0–15.0)
MCH: 32.1 pg (ref 26.0–34.0)
MCHC: 32 g/dL (ref 30.0–36.0)
MCV: 100.3 fL — ABNORMAL HIGH (ref 80.0–100.0)
Platelets: 170 10*3/uL (ref 150–400)
RBC: 3.46 MIL/uL — ABNORMAL LOW (ref 3.87–5.11)
RDW: 15.2 % (ref 11.5–15.5)
WBC: 7.7 10*3/uL (ref 4.0–10.5)
nRBC: 0 % (ref 0.0–0.2)

## 2022-01-09 LAB — HEPATIC FUNCTION PANEL
ALT: 28 U/L (ref 0–44)
AST: 157 U/L — ABNORMAL HIGH (ref 15–41)
Albumin: 3.2 g/dL — ABNORMAL LOW (ref 3.5–5.0)
Alkaline Phosphatase: 81 U/L (ref 38–126)
Bilirubin, Direct: 0.5 mg/dL — ABNORMAL HIGH (ref 0.0–0.2)
Indirect Bilirubin: 1.2 mg/dL — ABNORMAL HIGH (ref 0.3–0.9)
Total Bilirubin: 1.7 mg/dL — ABNORMAL HIGH (ref 0.3–1.2)
Total Protein: 5.3 g/dL — ABNORMAL LOW (ref 6.5–8.1)

## 2022-01-09 LAB — BASIC METABOLIC PANEL
Anion gap: 6 (ref 5–15)
Anion gap: 6 (ref 5–15)
BUN: 41 mg/dL — ABNORMAL HIGH (ref 8–23)
BUN: 36 mg/dL — ABNORMAL HIGH (ref 8–23)
CO2: 22 mmol/L (ref 22–32)
CO2: 22 mmol/L (ref 22–32)
Calcium: 7.5 mg/dL — ABNORMAL LOW (ref 8.9–10.3)
Calcium: 7.9 mg/dL — ABNORMAL LOW (ref 8.9–10.3)
Chloride: 112 mmol/L — ABNORMAL HIGH (ref 98–111)
Chloride: 111 mmol/L (ref 98–111)
Creatinine, Ser: 1.41 mg/dL — ABNORMAL HIGH (ref 0.44–1.00)
Creatinine, Ser: 1.44 mg/dL — ABNORMAL HIGH (ref 0.44–1.00)
GFR, Estimated: 38 mL/min — ABNORMAL LOW (ref >60)
GFR, Estimated: 37 mL/min — ABNORMAL LOW (ref >60)
Glucose, Bld: 83 mg/dL (ref 70–99)
Glucose, Bld: 83 mg/dL (ref 70–99)
Potassium: 3.3 mmol/L — ABNORMAL LOW (ref 3.5–5.1)
Potassium: 4.6 mmol/L (ref 3.5–5.1)
Sodium: 140 mmol/L (ref 135–145)
Sodium: 139 mmol/L (ref 135–145)

## 2022-01-09 LAB — GLUCOSE, CAPILLARY
Glucose-Capillary: 74 mg/dL (ref 70–99)
Glucose-Capillary: 92 mg/dL (ref 70–99)
Glucose-Capillary: 102 mg/dL — ABNORMAL HIGH (ref 70–99)
Glucose-Capillary: 137 mg/dL — ABNORMAL HIGH (ref 70–99)

## 2022-01-09 LAB — PHOSPHORUS: Phosphorus: 3.4 mg/dL (ref 2.5–4.6)

## 2022-01-09 LAB — HEMOGLOBIN A1C
Hgb A1c MFr Bld: 5.3 % (ref 4.8–5.6)
Mean Plasma Glucose: 105.41 mg/dL

## 2022-01-09 LAB — MRSA NEXT GEN BY PCR, NASAL: MRSA by PCR Next Gen: DETECTED — AB

## 2022-01-09 LAB — CORTISOL-AM, BLOOD: Cortisol - AM: 13.6 ug/dL (ref 6.7–22.6)

## 2022-01-09 LAB — MAGNESIUM: Magnesium: 1.8 mg/dL (ref 1.7–2.4)

## 2022-01-09 MED ORDER — MAGNESIUM SULFATE 2 GM/50ML IV SOLN
2.0000 g | Freq: Once | INTRAVENOUS | Status: AC
  Administered 2022-01-09: 2 g via INTRAVENOUS
  Filled 2022-01-09: qty 50

## 2022-01-09 MED ORDER — SODIUM CHLORIDE 0.9 % IV BOLUS
500.0000 mL | Freq: Once | INTRAVENOUS | Status: AC
  Administered 2022-01-09: 500 mL via INTRAVENOUS

## 2022-01-09 MED ORDER — CHLORHEXIDINE GLUCONATE CLOTH 2 % EX PADS
6.0000 | MEDICATED_PAD | Freq: Every day | CUTANEOUS | Status: DC
  Administered 2022-01-10: 6 via TOPICAL

## 2022-01-09 MED ORDER — MUPIROCIN 2 % EX OINT
1.0000 "application " | TOPICAL_OINTMENT | Freq: Two times a day (BID) | CUTANEOUS | Status: AC
  Administered 2022-01-09 – 2022-01-13 (×10): 1 via NASAL
  Filled 2022-01-09 (×5): qty 22

## 2022-01-09 MED ORDER — NOREPINEPHRINE 4 MG/250ML-% IV SOLN: 0.0000 ug/min | INTRAVENOUS | Status: DC

## 2022-01-09 MED ORDER — INSULIN ASPART 100 UNIT/ML IJ SOLN
0.0000 [IU] | Freq: Three times a day (TID) | INTRAMUSCULAR | Status: DC
  Administered 2022-01-09: 1 [IU] via SUBCUTANEOUS
  Administered 2022-01-11: 2 [IU] via SUBCUTANEOUS
  Administered 2022-01-11: 1 [IU] via SUBCUTANEOUS
  Administered 2022-01-12 (×2): 2 [IU] via SUBCUTANEOUS
  Administered 2022-01-12: 5 [IU] via SUBCUTANEOUS
  Administered 2022-01-13: 1 [IU] via SUBCUTANEOUS
  Administered 2022-01-13: 2 [IU] via SUBCUTANEOUS
  Administered 2022-01-13: 3 [IU] via SUBCUTANEOUS
  Administered 2022-01-14: 1 [IU] via SUBCUTANEOUS
  Administered 2022-01-15: 2 [IU] via SUBCUTANEOUS
  Administered 2022-01-15: 1 [IU] via SUBCUTANEOUS
  Administered 2022-01-16: 7 [IU] via SUBCUTANEOUS
  Administered 2022-01-17: 3 [IU] via SUBCUTANEOUS

## 2022-01-09 MED ORDER — CHLORHEXIDINE GLUCONATE CLOTH 2 % EX PADS
6.0000 | MEDICATED_PAD | Freq: Every day | CUTANEOUS | Status: DC
  Administered 2022-01-09 – 2022-01-10 (×2): 6 via TOPICAL

## 2022-01-09 MED ORDER — SODIUM CHLORIDE 0.9 % IV SOLN
250.0000 mL | INTRAVENOUS | Status: DC
Start: 2022-01-09 — End: 2022-01-10
  Administered 2022-01-09: 250 mL via INTRAVENOUS

## 2022-01-09 MED ORDER — POTASSIUM CHLORIDE CRYS ER 20 MEQ PO TBCR
40.0000 meq | EXTENDED_RELEASE_TABLET | Freq: Once | ORAL | Status: AC
  Administered 2022-01-09: 40 meq via ORAL
  Filled 2022-01-09: qty 2

## 2022-01-09 MED ORDER — ORAL CARE MOUTH RINSE
15.0000 mL | Freq: Two times a day (BID) | OROMUCOSAL | Status: DC
  Administered 2022-01-09 – 2022-01-17 (×15): 15 mL via OROMUCOSAL

## 2022-01-09 MED ORDER — LACTATED RINGERS IV SOLN: INTRAVENOUS | Status: DC

## 2022-01-09 MED ORDER — CHLORHEXIDINE GLUCONATE 0.12 % MT SOLN
15.0000 mL | Freq: Two times a day (BID) | OROMUCOSAL | Status: DC
  Administered 2022-01-09 – 2022-01-17 (×16): 15 mL via OROMUCOSAL
  Filled 2022-01-09 (×14): qty 15

## 2022-01-09 MED ORDER — SODIUM CHLORIDE 0.9 % IV BOLUS
500.0000 mL | Freq: Once | INTRAVENOUS | Status: AC | PRN
  Administered 2022-01-09: 500 mL via INTRAVENOUS

## 2022-01-09 MED ORDER — NOREPINEPHRINE 4 MG/250ML-% IV SOLN
0.0000 ug/min | INTRAVENOUS | Status: DC
  Administered 2022-01-09: 2 ug/min via INTRAVENOUS
  Filled 2022-01-09: qty 250

## 2022-01-09 MED ORDER — ALBUMIN HUMAN 25 % IV SOLN
100.0000 g | Freq: Once | INTRAVENOUS | Status: AC
  Administered 2022-01-09: 100 g via INTRAVENOUS
  Filled 2022-01-09: qty 400

## 2022-01-09 MED ORDER — LACTATED RINGERS IV BOLUS (SEPSIS): 500.0000 mL | Freq: Once | INTRAVENOUS | Status: DC

## 2022-01-09 NOTE — Progress Notes (Signed)
?  Transition of Care (TOC) Screening Note ? ? ?Patient Details  ?Name: Julie Mora ?Date of Birth: 19-Feb-1941 ? ? ?Transition of Care [facility]) CM/SW Contact:    ?Dessa Phi, RN ?Phone Number: ?01/09/2022, 9:52 AM ? ? ? ?Transition of Care Department [facility]) has reviewed patient and no TOC needs have been identified at this time. We will continue to monitor patient advancement through interdisciplinary progression rounds. If new patient transition needs arise, please place a TOC consult. ?  ?

## 2022-01-09 NOTE — Consult Note (Signed)
? ?NAME:  Julie Mora, MRN:  [id number], DOB:  Jul 24, 1941, LOS: 1 ?ADMISSION DATE:  01/08/2022, CONSULTATION DATE:  01/09/2022 ?REFERRING MD:  [place], CHIEF COMPLAINT:  Hypotension  ? ?History of Present Illness:  ?81 year old female presents to ED on 4/23 with reported altered mental status. On arrival to ED patient is lethargic, oriented to only place. Family reports patient with profuse diarrhea over the last several days, recently prescribed bactrim ( 2 days prior to admission) for possible lower extremity wounds and UTI, follows wound care outpatient for lower extremity chronic lymphedema.  ? ?Labs with U/A +Nitrates, many bacteria, Temp 101.2. BP 80/50 given 3L fluids with improvement. Admitted to medicine service. Overnight 4/23 with continued hypotension requiring transfer to ICU for vasopressors.   ? ?Pertinent  Medical History  ?CVA, HTN, Type 2 DM, HLD  ? ?Significant Hospital Events: ?Including procedures, antibiotic start and stop dates in addition to other pertinent events   ?Admitted 4/23 Confusion/Lethargy, transferred to ICU for hypotension ? ?Interim History / Subjective:  ?This AM on 2 mcg/min levophed, alert, follows commands, denies pain, denies diarrhea  ? ?Objective   ?Blood pressure (!) 91/43, pulse (!) 115, temperature 98 ?F (36.7 ?C), temperature source Oral, resp. rate 15, height '5\' 5"'$  (1.651 m), weight 102.6 kg, SpO2 96 %. ?   ?   ? ?Intake/Output Summary (Last 24 hours) at 01/09/2022 0913 ?Last data filed at 01/09/2022 0514 ?Gross per 24 hour  ?Intake 2863.88 ml  ?Output 350 ml  ?Net 2513.88 ml  ? ?Filed Weights  ? 01/08/22 1631 01/08/22 2109  ?Weight: 113 kg 102.6 kg  ? ? ?Examination: ?General: Elderly female lying in bed, no distress  ?HENT: Dry MM  ?Lungs: Clear breath sounds, no use of accessory muscles  ?Cardiovascular: RRR, HR 85, no mRG ?Abdomen: Soft, non-tender, obese, active bowels sounds  ?Extremities: Wrapping to BLE  ?Neuro: alert, follows commands, oriented to situation,  time, and location ?GU: intact  ? ?Resolved Hospital Problem list   ? ? ?Assessment & Plan:  ? ?Encephalopathy in setting of sepsis  ?-CT head negative  ?Dementia  ?Plan ?-Treatment as below  ? ?Septic Shock as evidence by Hypotension, Tachycardia, AKI; component of hypovolemia with reported multiple days of diarrhea  ?Plan ?-Cardiac Monitoring  ?-Titrate Levophed for MAP goal >65 ?-Send Cortisol level ?-Continue hydration  ? ?Acute Kidney Injury in setting of hypoperfusion/hypovolemia  ?Plan ?-Trend BMP ?-Strict I&O ?-Avoid nephrotoxic agents  ?-Continue hydration  ? ?UTI ?H/O Enterococcus Faecalis, Klebsiella  ?Diarrhea, likely viral gastritis   ?Plan ?-Trend WBC and Fever Curve  ?-Follow Culture Data  ?-Continue Rocephin, Vancomycin for now >> can de-escalate once cultures are back , D/C Flagyl  ?-Unable to obtain C.Diff/GI panel as patient is not longer having diarrhea   ? ?Type 2 DM  ?Plan ?-on home Lantus 20 units > continue to hold for now  ?-Trend glucose, SSI  ? ?Yeast beneath breast and abdominal folds  ?Plan ?-Nystatin and Desitin Cream  ? ?Chronic Lymphedema, Chronic venous statis changes   ?Plan ?-Follows outpatient wound care  ?-Daily wound care per RN  ? ? ?Best Practice (right click and "Reselect all SmartList Selections" daily)  ? ?Diet/type: Regular consistency (see orders) ?DVT prophylaxis: LMWH ?GI prophylaxis: N/A ?Lines: N/A ?Foley:  N/A ?Code Status:  limited ?Last date of multidisciplinary goals of care discussion. No family at bedside. Will update via phone.  ? ?Labs   ?CBC: ?Recent Labs  ?Lab 01/08/22 ?1650  ?WBC 9.0  ?  NEUTROABS 7.4  ?HGB 13.7  ?HCT 43.9  ?MCV 100.7*  ?PLT 241  ? ? ?Basic Metabolic Panel: ?Recent Labs  ?Lab 01/08/22 ?1650 01/09/22 ?0867  ?NA 140 140  ?K 4.5 3.3*  ?CL 111 112*  ?CO2 24 22  ?GLUCOSE 84 83  ?BUN 39* 41*  ?CREATININE 1.69* 1.41*  ?CALCIUM 8.0* 7.5*  ? ?GFR: ?Estimated Creatinine Clearance: 37.8 mL/min (A) (by C-G formula based on SCr of 1.41 mg/dL (H)). ?Recent  Labs  ?Lab 01/08/22 ?1650 01/08/22 ?1851 01/08/22 ?2129  ?WBC 9.0  --   --   ?LATICACIDVEN  --  1.3 1.1  ? ? ?Liver Function Tests: ?Recent Labs  ?Lab 01/08/22 ?1650  ?AST 74*  ?ALT 17  ?ALKPHOS 75  ?BILITOT 0.8  ?PROT 5.9*  ?ALBUMIN 1.8*  ? ?No results for input(s): LIPASE, AMYLASE in the last 168 hours. ?No results for input(s): AMMONIA in the last 168 hours. ? ?ABG ?   ?Component Value Date/Time  ? PHART 7.360 07/24/2009 0500  ? PCO2ART 24.3 (L) 07/24/2009 0500  ? PO2ART 75.0 (L) 07/24/2009 0500  ? HCO3 25.3 05/21/2020 2255  ? TCO2 12.2 07/24/2009 0500  ? ACIDBASEDEF 0.7 05/21/2020 2255  ? O2SAT 61.5 05/21/2020 2255  ?  ? ?Coagulation Profile: ?Recent Labs  ?Lab 01/08/22 ?1650 01/08/22 ?1818  ?INR 9.4* 1.2  ? ? ?Cardiac Enzymes: ?No results for input(s): CKTOTAL, CKMB, CKMBINDEX, TROPONINI in the last 168 hours. ? ?HbA1C: ?Hgb A1c MFr Bld  ?Date/Time Value Ref Range Status  ?01/08/2022 09:29 PM 5.3 4.8 - 5.6 % Final  ?  Comment:  ?  (NOTE) ?Pre diabetes:          5.7%-6.4% ? ?Diabetes:              >6.4% ? ?Glycemic control for   <7.0% ?adults with diabetes ?  ?03/28/2021 12:30 PM 8.8 (H) <5.7 % of total Hgb Final  ?  Comment:  ?  For someone without known diabetes, a hemoglobin A1c ?value of 6.5% or greater indicates that they may have  ?diabetes and this should be confirmed with a follow-up  ?test. ?. ?For someone with known diabetes, a value <7% indicates  ?that their diabetes is well controlled and a value  ?greater than or equal to 7% indicates suboptimal  ?control. A1c targets should be individualized based on  ?duration of diabetes, age, comorbid conditions, and  ?other considerations. ?. ?Currently, no consensus exists regarding use of ?hemoglobin A1c for diagnosis of diabetes for children. ?. ?  ? ? ?CBG: ?Recent Labs  ?Lab 01/08/22 ?1858 01/08/22 ?2139 01/09/22 ?0739  ?GLUCAP 77 93 74  ? ? ?Review of Systems:   ?Denies nausea, diarrhea, ABD pain  ? ?Past Medical History:  ?She,  has a past medical  history of Anemia, Essential hypertension, benign, Incontinence, Other and unspecified hyperlipidemia, Pemphigus foliaceous, Renal insufficiency, Type II or unspecified type diabetes mellitus without mention of complication, not stated as uncontrolled, and Vitamin D deficiency.  ? ?Surgical History:  ? ?Past Surgical History:  ?Procedure Laterality Date  ? CHOLECYSTECTOMY N/A 06/08/2017  ? Procedure: LAPAROSCOPIC CHOLECYSTECTOMY;  Surgeon: Kinsinger, Arta Bruce, MD;  Location: [facility];  Service: General;  Laterality: N/A;  ?  ? ?Social History:  ? reports that she has never smoked. She has never used smokeless tobacco. She reports that she does not drink alcohol and does not use drugs.  ? ?Family History:  ?Her family history includes Dementia (age of onset: 22) in her mother;  Pancreatic cancer (age of onset: 51) in her father.  ? ?Allergies ?Allergies  ?Allergen Reactions  ? Azathioprine Shortness Of Breath and Other (See Comments)  ?  severly allergic. nausea, diarrhea, low blood pressure, fever, chills, rash. was hospitalized due to side effects in feb 2012.  ? Cellcept [Mycophenolate Mofetil] Nausea Only  ?  severe nausea  ? Ramipril Cough  ?  ? ?Home Medications  ?Prior to Admission medications   ?Medication Sig Start Date End Date Taking? Authorizing Provider  ?ACCU-CHEK AVIVA PLUS test strip USE TO CHECK BLOOD SUGAR BEFORE BREAKFAST AND SUPPER 11/09/14   Elby Showers, MD  ?acetaminophen (TYLENOL) 500 MG tablet Take 500 mg by mouth every 6 (six) hours as needed for pain.    [provider]  ?ASPIRIN 81 PO Take by mouth.    [provider]  ?BD PEN NEEDLE NANO U/F 32G X 4 MM MISC USE AS DIRECTED 01/11/15   Elby Showers, MD  ?collagenase (SANTYL) ointment Apply topically daily. To both legs 05/26/20   Mercy Riding, MD  ?ferrous gluconate (FERGON) 324 MG tablet TAKE 1 TABLET(324 MG) BY MOUTH DAILY WITH BREAKFAST 06/28/20   Elby Showers, MD  ?gabapentin (NEURONTIN) 100 MG capsule Take 100 mg by  mouth 3 (three) times daily.    [provider]  ?insulin aspart (NOVOLOG) 100 UNIT/ML injection 0-15 Units, Subcutaneous, 3 times daily with meals, First dose on Sat 05/22/20 at 1700 ?Correction cover

## 2022-01-09 NOTE — Plan of Care (Signed)
?  Interdisciplinary Goals of Care Family Meeting ? ? ?Date carried out:: 01/09/2022 ? ?Location of the meeting: Bedside ? ?Member's involved: Nurse Practitioner, Family Member or next of kin, and Other: Patient ? ?Durable Power of Technical sales engineer maker: Sister, Julie Mora  ? ?Discussion: We discussed goals of care for Julie Mora .  Reviewed Goals of care. Previously a DNR, however now in EMR listed as partial code, okay with CPR not intubation, after reviewing this with sister and patient they both agree to DNR no CPR or intubation  ? ?Code status: Full DNR ? ?Disposition: SNF/LTAC ? ? ?Time spent for the meeting: 25 minutes  ? ?Julie Mora ?01/09/2022, 3:30 PM ? ?

## 2022-01-09 NOTE — Consult Note (Signed)
[facility] Nurse Consult Note: ?Reason for Consult: Admitted from facility with bilateral Unna's boots.  Few scattered full thickness wound noted upon removal. Serosanguinous exudate. Photos taken in ED yesterday. ?Wound type: Venous insufficiency ?Pressure Injury POA:N/A ?Measurement:Largest measures 2cm x 1cm x 0.1cm ?Wound bed:Red, moist punctate ?Drainage (amount, consistency, odor) serosanguinous, small amount ?Periwound: Hemosiderin staining ?Dressing procedure/placement/frequency: I will not implement Unna's Boot therapy at this time in favor of daily wound care allowing for visualization of the wounds. Nursing will be using a soap and water cleanse, rinse and follow with topical care using folded layers of antimicrobial, nonadherent (Xeroform) gauze topped with dry gauze, ABD pads for comfort and absorption, and securing with Kerlix roll gauze applied from just below toes to just below knees (heels inclusive). This is to be topped with ACE bandaging applied in a similar manner. Feet will be placed into Prevalon Boots for PI prevention. A sacral foam is also placed for PI prevention. Turning and repositioning is in place.  ? ?Upon return to her facility, Unna's Boots may be resumed if desired. ? ?[place] nursing team will not follow, but will remain available to this patient, the nursing and medical teams.  Please re-consult if needed. ?Thanks, ?Maudie Flakes, MSN, RN, [place], [place], CWON-AP, [place]  ?Pager# [phone]  ? ? ? ?  ?

## 2022-01-09 NOTE — Progress Notes (Signed)
An USGPIV (ultrasound guided PIV) has been placed for short-term vasopressor infusion. A correctly placed ivWatch must be used when administering Vasopressors. Should this treatment be needed beyond 72 hours, central line access should be obtained.  It will be the responsibility of the bedside nurse to follow best practice to prevent extravasations.   ?

## 2022-01-09 NOTE — Significant Event (Signed)
Rapid Response Event Note  ? ?Reason for Call :  ?Hypotension ? ?Initial Focused Assessment:  ?Patient alert, oriented to self, recalls being in hospital, but not oriented to time. Per documentation, this may be baseline. She follows commands and expresses dislike of nurses bothering her. She denies pain, shortness of breath, or other adverse symptoms. Patient is drowsy. BP 70-80's/40-50's currently which is same as when she entered ED. In ED, 3 Liter IR fluid bolus given. Patient has chronic bilateral lower extremity swelling and was admitted overnight for sepsis from cellulites from lower extremity wounds. Lung sounds are clear, diminished in bases bilaterally. No cough observed or reported. Patient had diarrhea prior to admit and one episode since, but no urine output. Bladder scan revealed 256 ml.  ? ?Interventions:  ?Focused assessment as above. See flow-sheet for vital sign documentation. Notified on call physician of hypotension. Per Rapid response standing orders, 500 ml NS IV fluid bolus given. When BP didn't improve, MD gave verbal order for additional 500 ml NS IV fluid bolus and came to bedside. MD reviewed chart and ordered Albumin infusion. Additionally, verbal orders given for IN/OUT catheterization if no urine output and bladder scan reveals 300 ml or greater.   ? ?Plan of Care:  ?Per Dr. Hal Hope, if BP doesn't improve after Albumin infusion, may need transfer to [facility] unit for pressors. Will continue to monitor and bedside RN will call if any changes or worsening of condition.  ? ?Event Summary:  ? ?MD Notified: Dr. Hal Hope ?Call Time: 5993 ?Arrival Time: [phone] ?End Time: 7793 ? ?Selinda Michaels, RN ?

## 2022-01-09 NOTE — Significant Event (Signed)
Despite patient receiving 4 L of fluids and giving albumin patient's blood pressure still under 90 systolic and map ranges between 60-68.  Heart rate is increasing at around 110 bpm.  Patient's temperature is around 98 ?F and respiratory rate is 24/min.  On exam at bedside patient is alert and awake.  We will start patient on Levophed transfer to ICU. ? ?Julie Mora ?

## 2022-01-10 ENCOUNTER — Inpatient Hospital Stay (HOSPITAL_COMMUNITY): Payer: Medicare PPO

## 2022-01-10 DIAGNOSIS — I509 Heart failure, unspecified: Secondary | ICD-10-CM

## 2022-01-10 DIAGNOSIS — G9341 Metabolic encephalopathy: Secondary | ICD-10-CM | POA: Diagnosis not present

## 2022-01-10 LAB — CBC
HCT: 35.4 % — ABNORMAL LOW (ref 36.0–46.0)
Hemoglobin: 11.4 g/dL — ABNORMAL LOW (ref 12.0–15.0)
MCH: 31.8 pg (ref 26.0–34.0)
MCHC: 32.2 g/dL (ref 30.0–36.0)
MCV: 98.9 fL (ref 80.0–100.0)
Platelets: 170 10*3/uL (ref 150–400)
RBC: 3.58 MIL/uL — ABNORMAL LOW (ref 3.87–5.11)
RDW: 15 % (ref 11.5–15.5)
WBC: 7.1 10*3/uL (ref 4.0–10.5)
nRBC: 0 % (ref 0.0–0.2)

## 2022-01-10 LAB — ECHOCARDIOGRAM LIMITED
Height: 65 in
S' Lateral: 2.9 cm
Weight: 3760.17 oz

## 2022-01-10 LAB — MAGNESIUM: Magnesium: 2 mg/dL (ref 1.7–2.4)

## 2022-01-10 LAB — GLUCOSE, CAPILLARY
Glucose-Capillary: 99 mg/dL (ref 70–99)
Glucose-Capillary: 162 mg/dL — ABNORMAL HIGH (ref 70–99)
Glucose-Capillary: 100 mg/dL — ABNORMAL HIGH (ref 70–99)

## 2022-01-10 LAB — BASIC METABOLIC PANEL
Anion gap: 4 — ABNORMAL LOW (ref 5–15)
BUN: 32 mg/dL — ABNORMAL HIGH (ref 8–23)
CO2: 21 mmol/L — ABNORMAL LOW (ref 22–32)
Calcium: 8.1 mg/dL — ABNORMAL LOW (ref 8.9–10.3)
Chloride: 115 mmol/L — ABNORMAL HIGH (ref 98–111)
Creatinine, Ser: 1.41 mg/dL — ABNORMAL HIGH (ref 0.44–1.00)
GFR, Estimated: 38 mL/min — ABNORMAL LOW (ref >60)
Glucose, Bld: 92 mg/dL (ref 70–99)
Potassium: 3.6 mmol/L (ref 3.5–5.1)
Sodium: 140 mmol/L (ref 135–145)

## 2022-01-10 LAB — C DIFFICILE QUICK SCREEN W PCR REFLEX
C Diff antigen: POSITIVE — AB
C Diff toxin: NEGATIVE

## 2022-01-10 LAB — PHOSPHORUS: Phosphorus: 2.1 mg/dL — ABNORMAL LOW (ref 2.5–4.6)

## 2022-01-10 LAB — CLOSTRIDIUM DIFFICILE BY PCR, REFLEXED: Toxigenic C. Difficile by PCR: NEGATIVE

## 2022-01-10 LAB — PROCALCITONIN: Procalcitonin: 0.23 ng/mL

## 2022-01-10 MED ORDER — POTASSIUM CHLORIDE CRYS ER 20 MEQ PO TBCR
20.0000 meq | EXTENDED_RELEASE_TABLET | Freq: Once | ORAL | Status: AC
  Administered 2022-01-10: 20 meq via ORAL
  Filled 2022-01-10: qty 1

## 2022-01-10 MED ORDER — MAGNESIUM GLUCONATE 500 MG PO TABS
500.0000 mg | ORAL_TABLET | Freq: Every day | ORAL | Status: DC
Start: 2022-01-10 — End: 2022-01-17
  Administered 2022-01-10 – 2022-01-17 (×8): 500 mg via ORAL
  Filled 2022-01-10 (×8): qty 1

## 2022-01-10 MED ORDER — POTASSIUM & SODIUM PHOSPHATES 280-160-250 MG PO PACK
1.0000 | PACK | ORAL | Status: AC
  Administered 2022-01-10: 1 via ORAL
  Filled 2022-01-10 (×2): qty 1

## 2022-01-10 MED ORDER — SIMVASTATIN 10 MG PO TABS
20.0000 mg | ORAL_TABLET | Freq: Every day | ORAL | Status: DC
  Administered 2022-01-10 – 2022-01-16 (×7): 20 mg via ORAL
  Filled 2022-01-10 (×7): qty 2

## 2022-01-10 MED ORDER — GABAPENTIN 100 MG PO CAPS
100.0000 mg | ORAL_CAPSULE | Freq: Every day | ORAL | Status: DC
  Administered 2022-01-10 – 2022-01-16 (×7): 100 mg via ORAL
  Filled 2022-01-10 (×7): qty 1

## 2022-01-10 MED ORDER — DONEPEZIL HCL 10 MG PO TABS
10.0000 mg | ORAL_TABLET | Freq: Every day | ORAL | Status: DC
  Administered 2022-01-10 – 2022-01-17 (×8): 10 mg via ORAL
  Filled 2022-01-10 (×8): qty 1

## 2022-01-10 MED ORDER — DEXTROSE 5 % IV SOLN: INTRAVENOUS | Status: DC

## 2022-01-10 MED ORDER — POTASSIUM PHOSPHATES 15 MMOLE/5ML IV SOLN
15.0000 mmol | Freq: Once | INTRAVENOUS | Status: DC
  Administered 2022-01-10: 15 mmol via INTRAVENOUS
  Filled 2022-01-10: qty 5

## 2022-01-10 MED ORDER — MEMANTINE HCL 10 MG PO TABS
5.0000 mg | ORAL_TABLET | Freq: Two times a day (BID) | ORAL | Status: DC
  Administered 2022-01-10 – 2022-01-17 (×15): 5 mg via ORAL
  Filled 2022-01-10 (×15): qty 1

## 2022-01-10 MED ORDER — LACTATED RINGERS IV BOLUS
500.0000 mL | Freq: Once | INTRAVENOUS | Status: AC
  Administered 2022-01-10: 500 mL via INTRAVENOUS

## 2022-01-10 MED ORDER — ASPIRIN 81 MG PO CHEW
81.0000 mg | CHEWABLE_TABLET | Freq: Every day | ORAL | Status: DC
Start: 2022-01-10 — End: 2022-01-17
  Administered 2022-01-10 – 2022-01-17 (×8): 81 mg via ORAL
  Filled 2022-01-10 (×8): qty 1

## 2022-01-10 MED ORDER — FERROUS GLUCONATE 324 (38 FE) MG PO TABS
324.0000 mg | ORAL_TABLET | Freq: Every day | ORAL | Status: DC
  Administered 2022-01-10 – 2022-01-17 (×8): 324 mg via ORAL
  Filled 2022-01-10 (×8): qty 1

## 2022-01-10 NOTE — Progress Notes (Signed)
? ?NAME:  Julie Mora, MRN:  [id number], DOB:  Jan 16, 1941, LOS: 2 ?ADMISSION DATE:  01/08/2022, CONSULTATION DATE:  01/09/2022 ?REFERRING MD:  [facility]ad, CHIEF COMPLAINT:  Hypotension  ? ?History of Present Illness:  ?81 year old female presents to ED on 4/23 with reported altered mental status. On arrival to ED patient is lethargic, oriented to only place. Family reports patient with profuse diarrhea over the last several days, recently prescribed bactrim ( 2 days prior to admission) for possible lower extremity wounds and UTI, follows wound care outpatient for lower extremity chronic lymphedema.  ? ?Labs with U/A +Nitrates, many bacteria, Temp 101.2. BP 80/50 given 3L fluids with improvement. Admitted to medicine service. Overnight 4/23 with continued hypotension requiring transfer to ICU for vasopressors.   ? ?Pertinent  Medical History  ?CVA, HTN, Type 2 DM, HLD  ? ?Significant Hospital Events: ?Including procedures, antibiotic start and stop dates in addition to other pertinent events   ?Admitted 4/23 Confusion/Lethargy ?4/24 > transferred to ICU for hypotension requiring low dose vasopressors  ? ?Interim History / Subjective:  ?Remains off vasopressors. Improvement in mentation, no further events overnight  ? ?Objective   ?Blood pressure 108/63, pulse 76, temperature 99.3 ?F (37.4 ?C), temperature source Oral, resp. rate 18, height '5\' 5"'$  (1.651 m), weight 102.6 kg, SpO2 99 %. ?   ?   ? ?Intake/Output Summary (Last 24 hours) at 01/10/2022 0721 ?Last data filed at 01/10/2022 0600 ?Gross per 24 hour  ?Intake 1679.82 ml  ?Output 450 ml  ?Net 1229.82 ml  ? ?Filed Weights  ? 01/08/22 1631 01/08/22 2109  ?Weight: 113 kg 102.6 kg  ? ? ?Examination: ?General: Elderly female lying in bed, no distress  ?HENT: Dry MM  ?Lungs: Clear breath sounds, no use of accessory muscles  ?Cardiovascular: RRR, HR 76, no mRG ?Abdomen: Soft, non-tender, obese, active bowels sounds  ?Extremities: Wrapping to BLE  ?Neuro: alert, follows  commands, oriented to situation, time, and location ?GU: intact  ? ?Resolved Hospital Problem list   ? ? ?Assessment & Plan:  ? ?Encephalopathy in setting of sepsis > improving  ?-CT head negative  ?Dementia  ?Neuropathy  ?Plan ?-Treatment as below  ?-Restart home Aricept and Namenda  ?-Continue home Neurontin  ? ?Septic Shock as evidence by Hypotension, Tachycardia, AKI; component of hypovolemia with reported multiple days of diarrhea  ?Plan ?-Cardiac Monitoring  ?-MAP goal >65 (remains off vasopressors)  ?-Cortisol level 13.6 > if requires vasopressors again would start stress dose steroids  ?-EKG with sinus Tachycardia > some PVC/PAV noted, Keep Mag >2 K >4, intermittent Tachycardia question remains component of sepsis, appears euvolemic at this point  ? ?HLD ?Plan ?-Restart home statin  ? ?Acute Kidney Injury in setting of hypoperfusion/hypovolemia > improving  ?Plan ?-Trend BMP ?-Strict I&O ?-Avoid nephrotoxic agents  ? ?UTI ?H/O Enterococcus Faecalis, Klebsiella  ?Diarrhea, likely viral gastritis   ?-Unable to obtain C.Diff/GI panel as patient is not longer having diarrhea   ?Plan ?-Trend WBC and Fever Curve  ?-Follow Culture Data  ?-Continue Rocephin, Vancomycin for now >> can de-escalate once cultures are back ? ?Type 2 DM  ?Plan ?-on home Lantus 20 units > continue to hold for now as glucose levels are 70-100 last 24 hours  ?-Trend glucose, SSI  ? ?Yeast beneath breast and abdominal folds  ?Plan ?-Nystatin and Desitin Cream  ? ?Chronic Lymphedema, Chronic venous statis changes   ?Plan ?-Follows outpatient wound care  ?-Daily wound care per RN  ?-Family states patient requires  compressions BID for lymphedema > will discuss with wound care nurse to see if family should bring in or we have it here  ? ? ?Best Practice (right click and "Reselect all SmartList Selections" daily)  ? ?Diet/type: Regular consistency (see orders) ?DVT prophylaxis: LMWH ?Code Status:  DNR ?Last date of multidisciplinary goals of care  discussion. Sister updated at bedside 4/24, all questions answered.  ?PT/OT ordered  ?Dispo: Transfer to floor. PCCM off 4/26  ? ?Labs   ?CBC: ?Recent Labs  ?Lab 01/08/22 ?1650 01/09/22 ?1050 01/10/22 ?0301  ?WBC 9.0 7.7 7.1  ?NEUTROABS 7.4  --   --   ?HGB 13.7 11.1* 11.4*  ?HCT 43.9 34.7* 35.4*  ?MCV 100.7* 100.3* 98.9  ?PLT 241 170 170  ? ? ?Basic Metabolic Panel: ?Recent Labs  ?Lab 01/08/22 ?1650 01/09/22 ?9485 01/09/22 ?4627 01/10/22 ?0301  ?NA 140 140 139 140  ?K 4.5 3.3* 4.6 3.6  ?CL 111 112* 111 115*  ?CO2 '24 22 22 '$ 21*  ?GLUCOSE 84 83 83 92  ?BUN 39* 41* 36* 32*  ?CREATININE 1.69* 1.41* 1.44* 1.41*  ?CALCIUM 8.0* 7.5* 7.9* 8.1*  ?MG  --   --  1.8 2.0  ?PHOS  --   --  3.4 2.1*  ? ?GFR: ?Estimated Creatinine Clearance: 37.8 mL/min (A) (by C-G formula based on SCr of 1.41 mg/dL (H)). ?Recent Labs  ?Lab 01/08/22 ?1650 01/08/22 ?1851 01/08/22 ?2129 01/09/22 ?0350 01/09/22 ?1050 01/10/22 ?0301  ?PROCALCITON  --   --   --  0.92  --  0.23  ?WBC 9.0  --   --   --  7.7 7.1  ?LATICACIDVEN  --  1.3 1.1  --   --   --   ? ? ?Liver Function Tests: ?Recent Labs  ?Lab 01/08/22 ?1650 01/09/22 ?0922  ?AST 74* 157*  ?ALT 17 28  ?ALKPHOS 75 81  ?BILITOT 0.8 1.7*  ?PROT 5.9* 5.3*  ?ALBUMIN 1.8* 3.2*  ? ?No results for input(s): LIPASE, AMYLASE in the last 168 hours. ?No results for input(s): AMMONIA in the last 168 hours. ? ?ABG ?   ?Component Value Date/Time  ? PHART 7.360 07/24/2009 0500  ? PCO2ART 24.3 (L) 07/24/2009 0500  ? PO2ART 75.0 (L) 07/24/2009 0500  ? HCO3 25.3 05/21/2020 2255  ? TCO2 12.2 07/24/2009 0500  ? ACIDBASEDEF 0.7 05/21/2020 2255  ? O2SAT 61.5 05/21/2020 2255  ?  ? ?Coagulation Profile: ?Recent Labs  ?Lab 01/08/22 ?1650 01/08/22 ?1818  ?INR 9.4* 1.2  ? ? ?Cardiac Enzymes: ?No results for input(s): CKTOTAL, CKMB, CKMBINDEX, TROPONINI in the last 168 hours. ? ?HbA1C: ?Hgb A1c MFr Bld  ?Date/Time Value Ref Range Status  ?01/08/2022 09:29 PM 5.3 4.8 - 5.6 % Final  ?  Comment:  ?  (NOTE) ?Pre diabetes:           5.7%-6.4% ? ?Diabetes:              >6.4% ? ?Glycemic control for   <7.0% ?adults with diabetes ?  ?03/28/2021 12:30 PM 8.8 (H) <5.7 % of total Hgb Final  ?  Comment:  ?  For someone without known diabetes, a hemoglobin A1c ?value of 6.5% or greater indicates that they may have  ?diabetes and this should be confirmed with a follow-up  ?test. ?. ?For someone with known diabetes, a value <7% indicates  ?that their diabetes is well controlled and a value  ?greater than or equal to 7% indicates suboptimal  ?control. A1c targets should be individualized based  on  ?duration of diabetes, age, comorbid conditions, and  ?other considerations. ?. ?Currently, no consensus exists regarding use of ?hemoglobin A1c for diagnosis of diabetes for children. ?. ?  ? ? ?CBG: ?Recent Labs  ?Lab 01/08/22 ?2139 01/09/22 ?0739 01/09/22 ?1145 01/09/22 ?1621 01/09/22 ?2202  ?GLUCAP 93 74 92 102* 137*  ? ? ?Review of Systems:   ?Denies nausea, diarrhea, ABD pain  ? ?Past Medical History:  ?She,  has a past medical history of Anemia, Essential hypertension, benign, Incontinence, Other and unspecified hyperlipidemia, Pemphigus foliaceous, Renal insufficiency, Type II or unspecified type diabetes mellitus without mention of complication, not stated as uncontrolled, and Vitamin D deficiency.  ? ?Surgical History:  ? ?Past Surgical History:  ?Procedure Laterality Date  ? CHOLECYSTECTOMY N/A 06/08/2017  ? Procedure: LAPAROSCOPIC CHOLECYSTECTOMY;  Surgeon: Kinsinger, Arta Bruce, MD;  Location: [facility];  Service: General;  Laterality: N/A;  ?  ? ?Social History:  ? reports that she has never smoked. She has never used smokeless tobacco. She reports that she does not drink alcohol and does not use drugs.  ? ?Family History:  ?Her family history includes Dementia (age of onset: 86) in her mother; Pancreatic cancer (age of onset: 13) in her father.  ? ?Allergies ?Allergies  ?Allergen Reactions  ? Azathioprine Shortness Of Breath and Other (See Comments)   ?  severly allergic. nausea, diarrhea, low blood pressure, fever, chills, rash. was hospitalized due to side effects in feb 2012. ?Other reaction(s): sob, nausea  ? Mycophenolate Mofetil Nausea Only  ?  severe n

## 2022-01-10 NOTE — Evaluation (Signed)
Occupational Therapy Evaluation ?Patient Details ?Name: Julie Mora ?MRN: [id number] ?DOB: Jul 09, 1941 ?Today's Date: 01/10/2022 ? ? ?History of Present Illness Patient is a 81 year old female who presented with AMS. patient was found to have encephalopathy, AKI, septic shock and UTI.  PMH: DM II, CKD III, HTN, HLD, CVA, DVT, morbid obesity.  ? ?Clinical Impression ?  ?Patient is a 81 year old female who was admitted for above. Patient reported being able to do what she wanted at home prior level. No family in room at this time for provide PLOF. Patient was max A x2 for supine to sit on edge of bed with increased pain in BLE. Patient would continue to benefit from skilled OT services at this time while admitted and after d/c to address noted deficits in order to improve overall safety and independence in ADLs.  ?  ?   ? ?Recommendations for follow up therapy are one component of a multi-disciplinary discharge planning process, led by the attending physician.  Recommendations may be updated based on patient status, additional functional criteria and insurance authorization.  ? ?Follow Up Recommendations ? Skilled nursing-short term rehab (<3 hours/day)  ?  ?Assistance Recommended at Discharge Frequent or constant Supervision/Assistance  ?Patient can return home with the following Two people to help with walking and/or transfers;Two people to help with bathing/dressing/bathroom;Direct supervision/assist for medications management;Help with stairs or ramp for entrance;Assist for transportation;Assistance with cooking/housework;Direct supervision/assist for financial management ? ?  ?Functional Status Assessment ? Patient has had a recent decline in their functional status and demonstrates the ability to make significant improvements in function in a reasonable and predictable amount of time.  ?Equipment Recommendations ? Other (comment) (defer to next venue)  ?  ?Recommendations for Other Services   ? ? ?  ?Precautions  / Restrictions Precautions ?Precautions: Fall ?Precaution Comments: monitor HR ?Restrictions ?Weight Bearing Restrictions: No  ? ?  ? ?Mobility Bed Mobility ?Overal bed mobility: Needs Assistance ?Bed Mobility: Supine to Sit, Sit to Supine ?  ?  ?Supine to sit: Max assist, +2 for safety/equipment, +2 for physical assistance ?Sit to supine: +2 for safety/equipment, +2 for physical assistance, Max assist ?  ?General bed mobility comments: patient was +2 assist for supine to sit on edge of bed with patient agitated with BLE being touched. patient unable to communicate where to touch LEs to reduce pain or able to pick them up herself. ?  ? ?Transfers ?  ?  ?  ?  ?  ?  ?  ?  ?  ?  ?  ? ?  ?Balance Overall balance assessment: Needs assistance ?Sitting-balance support: Feet supported, Bilateral upper extremity supported ?Sitting balance-Leahy Scale: Poor ?  ?Postural control: Posterior lean, Left lateral lean ?  ?  ?  ?  ?  ?  ?  ?  ?  ?  ?  ?  ?  ?  ?   ? ?ADL either performed or assessed with clinical judgement  ? ?ADL Overall ADL's : Needs assistance/impaired ?  ?  ?Grooming: Minimal assistance;Bed level ?  ?Upper Body Bathing: Bed level;Maximal assistance ?  ?Lower Body Bathing: Total assistance;Bed level ?  ?Upper Body Dressing : Maximal assistance;Bed level ?  ?Lower Body Dressing: Total assistance;Bed level ?  ?Toilet Transfer: +2 for physical assistance;+2 for safety/equipment ?Toilet Transfer Details (indicate cue type and reason): patient was +2 for supine to sit on edge of bed with patient squeezing therapists hands angrly about touching BLE. ?Toileting- Water quality scientist  and Hygiene: Total assistance;Bed level ?  ?  ?  ?Functional mobility during ADLs: +2 for physical assistance;+2 for safety/equipment ?   ? ? ? ?Vision   ?Additional Comments: unable to fully assess with patients current cog  ?   ?Perception   ?  ?Praxis   ?  ? ?Pertinent Vitals/Pain Pain Assessment ?Pain Assessment: Faces ?Faces Pain  Scale: No hurt  ? ? ? ?Hand Dominance Right ?  ?Extremity/Trunk Assessment Upper Extremity Assessment ?Upper Extremity Assessment: Defer to OT evaluation ?  ?Lower Extremity Assessment ?Lower Extremity Assessment: Difficult to assess due to impaired cognition ?  ?Cervical / Trunk Assessment ?Cervical / Trunk Assessment: Kyphotic ?  ?Communication Communication ?Communication: No difficulties ?  ?Cognition Arousal/Alertness: Awake/alert ?Behavior During Therapy: Flat affect, Agitated ?Overall Cognitive Status: Difficult to assess ?  ?  ?  ?  ?  ?  ?  ?  ?  ?  ?  ?  ?  ?  ?  ?  ?General Comments: patient was noted to be irritated with thoughts of movement. hard to assess cognitition with patient more irritated with cog questions. ?  ?  ?General Comments    ? ?  ?Exercises   ?  ?Shoulder Instructions    ? ? ?Home Living Family/patient expects to be discharged to:: Unsure ?  ?  ?  ?  ?  ?  ?  ?  ?  ?  ?  ?  ?  ?  ?  ?  ?Additional Comments: pt unable to provide history ?  ? ?  ?Prior Functioning/Environment Prior Level of Function : Patient poor historian/Family not available ?  ?  ?  ?  ?  ?  ?  ?ADLs Comments: no fmaily present to provide and pt unreliable historian ?  ? ?  ?  ?OT Problem List: Decreased activity tolerance;Impaired balance (sitting and/or standing);Decreased safety awareness;Cardiopulmonary status limiting activity;Decreased knowledge of precautions;Decreased knowledge of use of DME or AE ?  ?   ?OT Treatment/Interventions: Self-care/ADL training;Therapeutic exercise;Neuromuscular education;DME and/or AE instruction;Therapeutic activities;Energy conservation;Balance training;Patient/family education  ?  ?OT Goals(Current goals can be found in the care plan section) Acute Rehab OT Goals ?Patient Stated Goal: none stated ?OT Goal Formulation: Patient unable to participate in goal setting ?Time For Goal Achievement: 01/24/22 ?Potential to Achieve Goals: Fair  ?OT Frequency: Min 2X/week ?   ? ?Co-evaluation   ?Reason for Co-Treatment: To address functional/ADL transfers;For patient/therapist safety ?PT goals addressed during session: Mobility/safety with mobility;Balance ?OT goals addressed during session: Strengthening/ROM ?  ? ?  ?AM-PAC OT "6 Clicks" Daily Activity     ?Outcome Measure Help from another person eating meals?: A Lot ?Help from another person taking care of personal grooming?: A Lot ?Help from another person toileting, which includes using toliet, bedpan, or urinal?: Total ?Help from another person bathing (including washing, rinsing, drying)?: Total ?Help from another person to put on and taking off regular upper body clothing?: Total ?Help from another person to put on and taking off regular lower body clothing?: Total ?6 Click Score: 8 ?  ?End of Session Nurse Communication: Mobility status ? ?Activity Tolerance: Treatment limited secondary to agitation;Patient limited by pain ?Patient left: in bed;with call bell/phone within reach;with bed alarm set ? ?OT Visit Diagnosis: Unsteadiness on feet (R26.81);Other abnormalities of gait and mobility (R26.89);Muscle weakness (generalized) (M62.81)  ?              ?Time: 4098-1191 ?OT Time Calculation (min): 13 min ?  Charges:  OT General Charges ?$OT Visit: 1 Visit ?OT Evaluation ?$OT Eval Moderate Complexity: 1 Mod ? ?Allon Costlow OTR/L, MS ?Acute Rehabilitation Department ?Office# [phone] ?Pager# [phone] ? ? ?Feliz Beam Quenton Recendez ?01/10/2022, 4:30 PM ?

## 2022-01-10 NOTE — Progress Notes (Signed)
?   01/10/22 1525  ?Assess: MEWS Score  ?Temp 98.6 ?F (37 ?C)  ?BP 93/61  ?Pulse Rate (!) 129  ?Resp 20  ?SpO2 98 %  ?O2 Device Room Air  ?Assess: MEWS Score  ?MEWS Temp 0  ?MEWS Systolic 1  ?MEWS Pulse 2  ?MEWS RR 0  ?MEWS LOC 0  ?MEWS Score 3  ?MEWS Score Color Yellow  ?Assess: if the MEWS score is Yellow or Red  ?Were vital signs taken at a resting state? Yes  ?Focused Assessment No change from prior assessment  ?Does the patient meet 2 or more of the SIRS criteria? Yes  ?Does the patient have a confirmed or suspected source of infection? Yes  ?Provider and Rapid Response Notified? No  ?MEWS guidelines implemented *See Row Information* Yes  ?Treat  ?MEWS Interventions Other (Comment) ?(no treatment needed)  ?Take Vital Signs  ?Increase Vital Sign Frequency  Yellow: Q 2hr X 2 then Q 4hr X 2, if remains yellow, continue Q 4hrs  ?Escalate  ?MEWS: Escalate Yellow: discuss with charge nurse/RN and consider discussing with provider and RRT  ?Notify: Charge Nurse/RN  ?Name of Charge Nurse/RN Notified Harvest Dark RN  ?Date Charge Nurse/RN Notified 01/10/22  ?Time Charge Nurse/RN Notified 1918  ?Document  ?Patient Outcome Other (Comment)  ?Progress note created (see row info) Yes  ?Assess: SIRS CRITERIA  ?SIRS Temperature  0  ?SIRS Pulse 1  ?SIRS Respirations  0  ?SIRS WBC 0  ?SIRS Score Sum  1  ? ?MEWS started due to elevated heart rate. No change from prior assessment, no escalation needed. Will implement MEWS guidelines and continue to monitor patient.  ?

## 2022-01-10 NOTE — Progress Notes (Signed)
Patient arrived on the unit from the ICU, alert to self only, this is baseline. Vitals signs within normal limits.  ?

## 2022-01-10 NOTE — Progress Notes (Signed)
Continuous pulse ox ordered. Connected pt to bedside pulse ox monitor.  ?

## 2022-01-10 NOTE — Progress Notes (Signed)
[facility] ADULT ICU REPLACEMENT PROTOCOL ? ? ?The patient does apply for the [facility] Adult ICU Electrolyte Replacment Protocol based on the criteria listed below:  ? ?1.Exclusion criteria: TCTS patients, ECMO patients, and Dialysis patients ?2. Is GFR >/= 30 ml/min? Yes.    ?Patient's GFR today is 38 ?3. Is SCr </= 2? Yes.   ?Patient's SCr is 1.41 mg/dL ?4. Did SCr increase >/= 0.5 in 24 hours? No. ?5.Pt's weight >40kg  Yes.   ?6. Abnormal electrolyte(s): K+ 3.6, phos 2.1  ?7. Electrolytes replaced per protocol ?8.  Call MD STAT for K+ </= 2.5, Phos </= 1, or Mag </= 1 ?Physician:  n/a ? ?Darlys Gales 01/10/2022 4:14 AM ? ?

## 2022-01-10 NOTE — Progress Notes (Signed)
Echocardiogram ?2D Echocardiogram has been performed. ? ?Arlyss Gandy ?01/10/2022, 4:01 PM ?

## 2022-01-10 NOTE — Evaluation (Signed)
Physical Therapy Evaluation ?Patient Details ?Name: Julie Mora ?MRN: [id number] ?DOB: [birth year] ?Today's Date: 01/10/2022 ? ?History of Present Illness ? Patient is a 81 year old female who presented with AMS. patient was found to have encephalopathy, AKI, septic shock and UTI.  PMH: DM II, CKD III, HTN, HLD, CVA, DVT, morbid obesity. ? ?  ?Clinical Impression ? Julie Mora is 81 y.o. female admitted with above HPI and diagnosis. Patient is currently limited by functional impairments below (see PT problem list). Evaluation limited by impaired cognitive status and pt agitation with mobility. She currently requires Max+2 assist for bed mobility and unclear on PLOF. Per chart review pt was living at home and able to transfer and ambulate short distances with RW ~1.5 years ago. Patient will benefit from continued skilled PT interventions to address impairments and progress independence with mobility, recommending SNF with 24/7 assist. Acute PT will follow and progress as able.    ?   ? ?Recommendations for follow up therapy are one component of a multi-disciplinary discharge planning process, led by the attending physician.  Recommendations may be updated based on patient status, additional functional criteria and insurance authorization. ? ?Follow Up Recommendations Skilled nursing-short term rehab (<3 hours/day) ? ?  ?Assistance Recommended at Discharge Frequent or constant Supervision/Assistance  ?Patient can return home with the following ? Two people to help with walking and/or transfers;Two people to help with bathing/dressing/bathroom;Assistance with cooking/housework;Assistance with feeding;Direct supervision/assist for medications management;Direct supervision/assist for financial management;Assist for transportation;Help with stairs or ramp for entrance ? ?  ?Equipment Recommendations Other (comment) (TBA)  ?Recommendations for Other Services ?    ?  ?Functional Status Assessment Patient has had a recent  decline in their functional status and/or demonstrates limited ability to make significant improvements in function in a reasonable and predictable amount of time  ? ?  ?Precautions / Restrictions Precautions ?Precautions: Fall ?Precaution Comments: monitor HR ?Restrictions ?Weight Bearing Restrictions: No  ? ?  ? ?Mobility ? Bed Mobility ?Overal bed mobility: Needs Assistance ?Bed Mobility: Supine to Sit, Sit to Supine ?  ?  ?Supine to sit: Max assist, +2 for safety/equipment, +2 for physical assistance ?Sit to supine: +2 for safety/equipment, +2 for physical assistance, Max assist ?  ?General bed mobility comments: patient required Max/Total assist for supine<>sit EOB with use of bed pad to pivot. Assist required to raise trunk and to bring LE's off EOB. ?  ? ?Transfers ?  ?  ?  ?  ?  ?  ?  ?  ?  ?  ?  ? ?Ambulation/Gait ?  ?  ?  ?  ?  ?  ?  ?  ? ?Stairs ?  ?  ?  ?  ?  ? ?Wheelchair Mobility ?  ? ?Modified Rankin (Stroke Patients Only) ?  ? ?  ? ?Balance Overall balance assessment: Needs assistance ?Sitting-balance support: Feet supported, Bilateral upper extremity supported ?Sitting balance-Leahy Scale: Poor ?  ?Postural control: Posterior lean, Left lateral lean ?  ?  ?  ?  ?  ?  ?  ?  ?  ?  ?  ?  ?  ?  ?   ? ? ? ?Pertinent Vitals/Pain Pain Assessment ?Pain Assessment: Faces ?Faces Pain Scale: No hurt ?Pain Intervention(s): Monitored during session, Repositioned  ? ? ?Home Living Family/patient expects to be discharged to:: Unsure ?  ?  ?  ?  ?  ?  ?  ?  ?  ?Additional Comments: pt unable to  provide history  ?  ?Prior Function Prior Level of Function : Patient poor historian/Family not available ?  ?  ?  ?  ?  ?  ?  ?ADLs Comments: no fmaily present to provide and pt unreliable historian ?  ? ? ?Hand Dominance  ? Dominant Hand: Right ? ?  ?Extremity/Trunk Assessment  ? Upper Extremity Assessment ?Upper Extremity Assessment: Defer to OT evaluation ?  ? ?Lower Extremity Assessment ?Lower Extremity Assessment:  Difficult to assess due to impaired cognition ?  ? ?Cervical / Trunk Assessment ?Cervical / Trunk Assessment: Kyphotic  ?Communication  ? Communication: No difficulties  ?Cognition Arousal/Alertness: Awake/alert ?Behavior During Therapy: Flat affect, Agitated ?Overall Cognitive Status: No family/caregiver present to determine baseline cognitive functioning ?  ?  ?  ?  ?  ?  ?  ?  ?  ?  ?  ?  ?  ?  ?  ?  ?General Comments: pt irritated with therapists and required MAX cuing to participate in EOB activity. ?  ?  ? ?  ?General Comments   ? ?  ?Exercises    ? ?Assessment/Plan  ?  ?PT Assessment Patient needs continued PT services  ?PT Problem List Decreased strength;Decreased activity tolerance;Decreased balance;Decreased mobility;Decreased knowledge of use of DME;Decreased knowledge of precautions;Decreased cognition;Decreased safety awareness;Obesity ? ?   ?  ?PT Treatment Interventions DME instruction;Gait training;Stair training;Functional mobility training;Therapeutic activities;Therapeutic exercise;Balance training;Patient/family education   ? ?PT Goals (Current goals can be found in the Care Plan section)  ?Acute Rehab PT Goals ?Patient Stated Goal: unable to state ?PT Goal Formulation: Patient unable to participate in goal setting ?Time For Goal Achievement: 01/24/22 ?Potential to Achieve Goals: Fair ? ?  ?Frequency Min 2X/week ?  ? ? ?Co-evaluation PT/OT/SLP Co-Evaluation/Treatment: Yes ?Reason for Co-Treatment: To address functional/ADL transfers;For patient/therapist safety ?PT goals addressed during session: Mobility/safety with mobility;Balance ?OT goals addressed during session: Strengthening/ROM ?  ? ? ?  ?AM-PAC PT "6 Clicks" Mobility  ?Outcome Measure Help needed turning from your back to your side while in a flat bed without using bedrails?: Total ?Help needed moving from lying on your back to sitting on the side of a flat bed without using bedrails?: Total ?Help needed moving to and from a bed to a  chair (including a wheelchair)?: Total ?Help needed standing up from a chair using your arms (e.g., wheelchair or bedside chair)?: Total ?Help needed to walk in hospital room?: Total ?Help needed climbing 3-5 steps with a railing? : Total ?6 Click Score: 6 ? ?  ?End of Session   ?Activity Tolerance: Treatment limited secondary to agitation ?Patient left: in bed;with call bell/phone within reach;with bed alarm set ?Nurse Communication: Mobility status ?PT Visit Diagnosis: Muscle weakness (generalized) (M62.81);Difficulty in walking, not elsewhere classified (R26.2);Unsteadiness on feet (R26.81) ?  ? ?Time: 1660-6301 ?PT Time Calculation (min) (ACUTE ONLY): 12 min ? ? ?Charges:   PT Evaluation ?$PT Eval Moderate Complexity: 1 Mod ?  ?  ?   ? ? ?Gwynneth Albright PT, DPT ?Acute Rehabilitation Services ?Office [phone] ?Pager [phone]  ? ?Jacques Navy ?01/10/2022, 4:00 PM ? ?

## 2022-01-10 NOTE — Plan of Care (Signed)

## 2022-01-11 DIAGNOSIS — G9341 Metabolic encephalopathy: Secondary | ICD-10-CM | POA: Diagnosis not present

## 2022-01-11 DIAGNOSIS — A419 Sepsis, unspecified organism: Secondary | ICD-10-CM | POA: Diagnosis not present

## 2022-01-11 DIAGNOSIS — N179 Acute kidney failure, unspecified: Secondary | ICD-10-CM | POA: Diagnosis not present

## 2022-01-11 DIAGNOSIS — E1169 Type 2 diabetes mellitus with other specified complication: Secondary | ICD-10-CM | POA: Diagnosis not present

## 2022-01-11 LAB — CBC
HCT: 33.3 % — ABNORMAL LOW (ref 36.0–46.0)
Hemoglobin: 10.7 g/dL — ABNORMAL LOW (ref 12.0–15.0)
MCH: 31.8 pg (ref 26.0–34.0)
MCHC: 32.1 g/dL (ref 30.0–36.0)
MCV: 99.1 fL (ref 80.0–100.0)
Platelets: 170 10*3/uL (ref 150–400)
RBC: 3.36 MIL/uL — ABNORMAL LOW (ref 3.87–5.11)
RDW: 15.5 % (ref 11.5–15.5)
WBC: 7.4 10*3/uL (ref 4.0–10.5)
nRBC: 0 % (ref 0.0–0.2)

## 2022-01-11 LAB — GASTROINTESTINAL PANEL BY PCR, STOOL (REPLACES STOOL CULTURE)

## 2022-01-11 LAB — BASIC METABOLIC PANEL
Anion gap: 4 — ABNORMAL LOW (ref 5–15)
BUN: 31 mg/dL — ABNORMAL HIGH (ref 8–23)
CO2: 21 mmol/L — ABNORMAL LOW (ref 22–32)
Calcium: 8.1 mg/dL — ABNORMAL LOW (ref 8.9–10.3)
Chloride: 115 mmol/L — ABNORMAL HIGH (ref 98–111)
Creatinine, Ser: 1.2 mg/dL — ABNORMAL HIGH (ref 0.44–1.00)
GFR, Estimated: 46 mL/min — ABNORMAL LOW (ref >60)
Glucose, Bld: 92 mg/dL (ref 70–99)
Potassium: 3.5 mmol/L (ref 3.5–5.1)
Sodium: 140 mmol/L (ref 135–145)

## 2022-01-11 LAB — GLUCOSE, CAPILLARY: Glucose-Capillary: 152 mg/dL — ABNORMAL HIGH (ref 70–99)

## 2022-01-11 LAB — MAGNESIUM: Magnesium: 2 mg/dL (ref 1.7–2.4)

## 2022-01-11 LAB — PHOSPHORUS: Phosphorus: 1.9 mg/dL — ABNORMAL LOW (ref 2.5–4.6)

## 2022-01-11 MED ORDER — MELATONIN 3 MG PO TABS
3.0000 mg | ORAL_TABLET | Freq: Once | ORAL | Status: AC
  Administered 2022-01-11: 3 mg via ORAL
  Filled 2022-01-11: qty 1

## 2022-01-11 MED ORDER — K PHOS MONO-SOD PHOS DI & MONO 155-852-130 MG PO TABS
250.0000 mg | ORAL_TABLET | Freq: Three times a day (TID) | ORAL | Status: AC
  Administered 2022-01-11 – 2022-01-12 (×4): 250 mg via ORAL
  Filled 2022-01-11 (×4): qty 1

## 2022-01-11 NOTE — TOC Progression Note (Signed)
Transition of Care (TOC) - Progression Note  ? ? ?Patient Details  ?Name: Julie Mora ?MRN: [id number] ?Date of Birth: Dec 10, 1940 ? ?Transition of Care (TOC) CM/SW Contact  ?Tawanna Cooler, RN ?Phone Number: ?01/11/2022, 3:19 PM ? ?Clinical Narrative:    ? ?Spoke with patient's sister, [name], about PT recommendation for SNF.  [name] is concerned that patient's dementia is worsening, and she might not improve back to baseline.  She is concerned that patient will never be able to live independently at [facility] again.  Discussed the difference between a short term rehab stay at a SNF and long term care.  Explained that patient's insurance will only pay for short term, will not pay for a long term stay.  Explained that patient will need to use her own funds prior to being able to qualify for Medicaid, and encouraged her to seek the advice and counsel of an Elder Training and development officer.  [name] verbalized understanding.  ? ?Explained that CM will send a SNF referral today, and will call her tomorrow with bed offers.   ? ?

## 2022-01-11 NOTE — Progress Notes (Signed)
Gathered supplies to do wound care on patient, patient told this nurse that I was not unwrapping her legs to do wound care and wound care was not supposed to be done every other day. I tried to explain to patient that wound care is to be done every day and patient advised me that I would not be doing wound care today and we could discuss it again tomorrow.  ?

## 2022-01-11 NOTE — Progress Notes (Addendum)
Occupational Therapy Treatment ?Patient Details ?Name: Julie Mora ?MRN: [id number] ?DOB: 18-Jan-1941 ?Today's Date: 01/11/2022 ? ? ?History of present illness Patient is a 81 year old female who presented with AMS. patient was found to have encephalopathy, AKI, septic shock and UTI.  PMH: DM II, CKD III, HTN, HLD, CVA, DVT, morbid obesity. ?  ?OT comments ? Treatment focused on ADL task at bed level due to lack of second person assist. Patient agreeable though somewhat irritable about beeping continuous pulse ox and lack of lunch. Patient's cognition appears to have improved. She is abel to answer questions appropriately - though unsure of accuracy. She did say she was a two person transfer and then spoke about the hoyer but also reports assisting with her ADLs from chair level. In bed she is able to wash her face, assist therapist with pulling herself up in bed (pulling on the head board with arms) and feed herself with setup. She may have some low vision as she demonstrated some difficulty with managing food. Will continue to assess. Recommend SNF at discharge to maximize functional abilities and reduce caregiver burden.  ? ?Recommendations for follow up therapy are one component of a multi-disciplinary discharge planning process, led by the attending physician.  Recommendations may be updated based on patient status, additional functional criteria and insurance authorization. ?   ?Follow Up Recommendations ? Skilled nursing-short term rehab (<3 hours/day)  ?  ?Assistance Recommended at Discharge Frequent or constant Supervision/Assistance  ?Patient can return home with the following ? Two people to help with walking and/or transfers;Two people to help with bathing/dressing/bathroom;Direct supervision/assist for medications management;Help with stairs or ramp for entrance;Assist for transportation;Assistance with cooking/housework;Direct supervision/assist for financial management ?  ?Equipment Recommendations ?     ?  ?Recommendations for Other Services   ? ?  ?Precautions / Restrictions Precautions ?Precautions: Fall ?Precaution Comments: monitor HR ?Restrictions ?Weight Bearing Restrictions: No  ? ? ?  ? ?Mobility Bed Mobility ?  ?  ?  ?  ?  ?  ?  ?General bed mobility comments: Max assist for pulling up in bed in order to self feed appropriately. Therapist used boost mood and patient able to assist with pulling up on head board ?  ? ?Transfers ?  ?  ?  ?  ?  ?  ?  ?  ?  ?  ?  ?  ?Balance   ?  ?  ?  ?  ?  ?  ?  ?  ?  ?  ?  ?  ?  ?  ?  ?  ?  ?  ?   ? ?ADL either performed or assessed with clinical judgement  ? ?ADL Overall ADL's : Needs assistance/impaired ?Eating/Feeding: Set up;Bed level ?Eating/Feeding Details (indicate cue type and reason): assisted with food tray set up, may have some low vision deficits - grossly able to bring food to mouth with some dropping. ?Grooming: Set up;Bed level ?Grooming Details (indicate cue type and reason): able to wash her face ?  ?  ?  ?  ?  ?  ?  ?  ?  ?  ?  ?  ?  ?  ?  ?  ?  ? ?Extremity/Trunk Assessment   ?  ?  ?  ?  ?  ? ?Vision   ?Additional Comments: appears to have difficulty with locating items on food tray and locating food she dropped on her chest ?  ?Perception   ?  ?Praxis   ?  ? ?  Cognition Arousal/Alertness: Awake/alert ?Behavior During Therapy: [facility] for tasks assessed/performed ?Overall Cognitive Status: No family/caregiver present to determine baseline cognitive functioning ?  ?  ?  ?  ?  ?  ?  ?  ?  ?  ?  ?  ?  ?  ?  ?  ?General Comments: Unsure of baseline cognition. Able to answer questions appropriately though unsure of accuracy - but unable to state year or place ?  ?  ?   ?Exercises   ? ?  ?Shoulder Instructions   ? ? ?  ?General Comments    ? ? ?Pertinent Vitals/ Pain       Pain Assessment ?Pain Assessment: Faces ?Faces Pain Scale: Hurts little more ?Pain Location: Les - with movement ?Pain Descriptors / Indicators: Grimacing ?Pain Intervention(s): Limited activity  within patient's tolerance ? ?Home Living   ?  ?  ?  ?  ?  ?  ?  ?  ?  ?  ?  ?  ?  ?  ?  ?  ?  ?  ? ?  ?Prior Functioning/Environment    ?  ?  ?  ?   ? ?Frequency ? Min 2X/week  ? ? ? ? ?  ?Progress Toward Goals ? ?OT Goals(current goals can now be found in the care plan section) ? Progress towards OT goals: Progressing toward goals ? ?Acute Rehab OT Goals ?Patient Stated Goal: eat lunch ?OT Goal Formulation: With patient ?Time For Goal Achievement: 01/24/22 ?Potential to Achieve Goals: Fair  ?Plan Discharge plan remains appropriate   ? ?Co-evaluation ? ? ?   ?  ?  ?OT goals addressed during session: ADL's and self-care ?  ? ?  ?AM-PAC OT "6 Clicks" Daily Activity     ?Outcome Measure ? ? Help from another person eating meals?: A Little ?Help from another person taking care of personal grooming?: A Lot ?Help from another person toileting, which includes using toliet, bedpan, or urinal?: Total ?Help from another person bathing (including washing, rinsing, drying)?: Total ?Help from another person to put on and taking off regular upper body clothing?: A Lot ?Help from another person to put on and taking off regular lower body clothing?: Total ?6 Click Score: 10 ? ?  ?End of Session   ? ?OT Visit Diagnosis: Unsteadiness on feet (R26.81);Other abnormalities of gait and mobility (R26.89);Muscle weakness (generalized) (M62.81) ?  ?Activity Tolerance Patient tolerated treatment well ?  ?Patient Left in bed;with call bell/phone within reach;with bed alarm set ?  ?Nurse Communication Mobility status ?  ? ?   ? ?Time: 8280-0349 ?OT Time Calculation (min): 15 min ? ?Charges: OT General Charges ?$OT Visit: 1 Visit ?OT Treatments ?$Self Care/Home Management : 8-22 mins ? ?Julie Mora, OTR/L ?Acute Care Rehab Services  ?Office [phone] ?Pager: [phone]  ? ?Julie Mora ?01/11/2022, 2:55 PM ?

## 2022-01-11 NOTE — Progress Notes (Signed)
[facility] ? ?PROGRESS NOTE ? ?Julie Mora XVQ:[id number] DOB: 03-26-1941 DOA: 01/08/2022 ?PCP: System, Provider Not In ? ? ?Brief HPI:   ?81 y.o. female with medical history significant of CVA, HTN, Type 2 diabetes, HLD who presents from ALF at request of sister for AMS.  ?She requested patient be brought to the hospital today due to altered mental status and being confused.  She last saw her normal about 4 days ago.  However over the weekend patient has been having profuse diarrhea with known GI infection in the facility.  Patient is also been on Bactrim for the past 2 days for possible lower extremity wound and UTI.  She has been following wound care outpatient for bilateral lower leg with chronic lymphedema. ? ?In the ED she was febrile with temperature 101.2 ?F, tachypnea, tachycardia, hypotension 80/50 which improved with IV fluids.  UA was positive for nitrate, moderate leukocytosis and more than 50 RBCs per high-power field, more than 50 WBC and many bacteria. ?Patient was admitted with sepsis, started on IV vancomycin, Rocephin and Flagyl, blood cultures were obtained. ? ?On 4/24 morning patient was found to be hypotensive with MAP between 60-68, also had tachycardia.  PCCM was consulted patient was transferred to ICU and started on Levophed. ? ?She was transitioned off Levophed and moved to medical floor. ? ?TSH resumed care on 01/11/2022 ? ? ?Subjective  ? ?Patient seen and examined, no new complaints. ? ? Assessment/Plan:  ? ? ?Septic shock ?-Secondary to sepsis due to UTI, along with dehydration ?-Initially required Levophed, has been weaned off Levophed ?-Urine culture growing 30,000 colonies per mL of Klebsiella pneumoniae, 20,000 colonies per mL of Enterococcus faecalis ?-Final result is pending ?-Sepsis physiology has resolved ? ?Metabolic encephalopathy ?-Improved, likely at baseline ?-Continue to monitor ? ?Acute kidney injury ?-In setting of hypoperfusion due to hypotension ?-Improved  with IV fluids ?-Creatinine has improved to 1.20 ? ?Diarrhea ?-Resolved ?-Likely viral gastroenteritis ? ?Diabetes mellitus type 2 ?-Continue sliding scale insulin NovoLog ? ?Chronic lymphedema/chronic venous stasis changes ?-Wound care consulted ? ?Dementia ?-No behavior disturbance ?-Continue donepezil ? ? ? ?Medications ? ?  ? aspirin  81 mg Oral Daily  ? chlorhexidine  15 mL Mouth Rinse BID  ? donepezil  10 mg Oral Daily  ? enoxaparin (LOVENOX) injection  40 mg Subcutaneous Q24H  ? ferrous gluconate  324 mg Oral Daily  ? gabapentin  100 mg Oral QHS  ? insulin aspart  0-9 Units Subcutaneous TID AC & HS  ? liver oil-zinc oxide   Topical BID  ? magnesium gluconate  500 mg Oral Daily  ? mouth rinse  15 mL Mouth Rinse BID  ? mouth rinse  15 mL Mouth Rinse q12n4p  ? memantine  5 mg Oral BID  ? mupirocin ointment  1 application. Nasal BID  ? nystatin cream   Topical BID  ? simvastatin  20 mg Oral QHS  ? ? ? Data Reviewed:  ? ?CBG: ? ?Recent Labs  ?Lab 01/09/22 ?1621 01/09/22 ?2202 01/10/22 ?0754 01/10/22 ?1211 01/10/22 ?2113  ?GLUCAP 102* 137* 99 162* 100*  ? ? ?SpO2: 97 % ?O2 Flow Rate (L/min): 2 L/min  ? ? ?Vitals:  ? 01/10/22 2316 01/11/22 0315 01/11/22 1258 01/11/22 1450  ?BP: 107/81 93/70 (!) 86/67 112/78  ?Pulse: (!) 126 97 (!) 58 69  ?Resp:  '16 20 20  '$ ?Temp: 99.2 ?F (37.3 ?C) 98.2 ?F (36.8 ?C) 97.8 ?F (36.6 ?C) 97.9 ?F (36.6 ?C)  ?TempSrc: Oral  Oral Axillary  ?SpO2: 95% 98% 99% 97%  ?Weight:      ?Height:      ? ? ? ? ?Data Reviewed: ? ?Basic Metabolic Panel: ?Recent Labs  ?Lab 01/08/22 ?1650 01/09/22 ?5003 01/09/22 ?7048 01/10/22 ?0301 01/11/22 ?8891  ?NA 140 140 139 140 140  ?K 4.5 3.3* 4.6 3.6 3.5  ?CL 111 112* 111 115* 115*  ?CO2 '24 22 22 '$ 21* 21*  ?GLUCOSE 84 83 83 92 92  ?BUN 39* 41* 36* 32* 31*  ?CREATININE 1.69* 1.41* 1.44* 1.41* 1.20*  ?CALCIUM 8.0* 7.5* 7.9* 8.1* 8.1*  ?MG  --   --  1.8 2.0 2.0  ?PHOS  --   --  3.4 2.1* 1.9*  ? ? ?CBC: ?Recent Labs  ?Lab 01/08/22 ?1650 01/09/22 ?1050 01/10/22 ?0301  01/11/22 ?6945  ?WBC 9.0 7.7 7.1 7.4  ?NEUTROABS 7.4  --   --   --   ?HGB 13.7 11.1* 11.4* 10.7*  ?HCT 43.9 34.7* 35.4* 33.3*  ?MCV 100.7* 100.3* 98.9 99.1  ?PLT 241 170 170 170  ? ? ?LFT ?Recent Labs  ?Lab 01/08/22 ?1650 01/09/22 ?0922  ?AST 74* 157*  ?ALT 17 28  ?ALKPHOS 75 81  ?BILITOT 0.8 1.7*  ?PROT 5.9* 5.3*  ?ALBUMIN 1.8* 3.2*  ? ?  ?Antibiotics: ?Anti-infectives (From admission, onward)  ? ? Start     Dose/Rate Route Frequency Ordered Stop  ? 01/10/22 1800  vancomycin (VANCOREADY) IVPB 1250 mg/250 mL  Status:  Discontinued       ? 1,250 mg ?166.7 mL/hr over 90 Minutes Intravenous Every 48 hours 01/08/22 2007 01/10/22 1154  ? 01/09/22 1000  cefTRIAXone (ROCEPHIN) 2 g in sodium chloride 0.9 % 100 mL IVPB       ? 2 g ?200 mL/hr over 30 Minutes Intravenous Daily 01/08/22 2003    ? 01/09/22 0600  metroNIDAZOLE (FLAGYL) IVPB 500 mg  Status:  Discontinued       ? 500 mg ?100 mL/hr over 60 Minutes Intravenous Every 12 hours 01/08/22 1945 01/09/22 0952  ? 01/08/22 2200  cefTRIAXone (ROCEPHIN) 2 g in sodium chloride 0.9 % 100 mL IVPB  Status:  Discontinued       ? 2 g ?200 mL/hr over 30 Minutes Intravenous Daily 01/08/22 1959 01/08/22 2003  ? 01/08/22 2000  cefTRIAXone (ROCEPHIN) 1 g in sodium chloride 0.9 % 100 mL IVPB  Status:  Discontinued       ? 1 g ?200 mL/hr over 30 Minutes Intravenous Daily 01/08/22 1945 01/08/22 1959  ? 01/08/22 1700  ceFEPIme (MAXIPIME) 2 g in sodium chloride 0.9 % 100 mL IVPB  Status:  Discontinued       ? 2 g ?200 mL/hr over 30 Minutes Intravenous  Once 01/08/22 1648 01/08/22 1715  ? 01/08/22 1700  metroNIDAZOLE (FLAGYL) IVPB 500 mg       ? 500 mg ?100 mL/hr over 60 Minutes Intravenous  Once 01/08/22 1648 01/08/22 1825  ? 01/08/22 1700  vancomycin (VANCOCIN) IVPB 1000 mg/200 mL premix  Status:  Discontinued       ? 1,000 mg ?200 mL/hr over 60 Minutes Intravenous  Once 01/08/22 1648 01/08/22 1716  ? 01/08/22 1700  ceFEPIme (MAXIPIME) 2 g in sodium chloride 0.9 % 100 mL IVPB       ? 2 g ?200  mL/hr over 30 Minutes Intravenous  Once 01/08/22 1654 01/08/22 1751  ? 01/08/22 1700  vancomycin (VANCOREADY) IVPB 2000 mg/400 mL       ? 2,000  mg ?200 mL/hr over 120 Minutes Intravenous  Once 01/08/22 1654 01/08/22 2025  ? ?  ? ? ? ?DVT prophylaxis: Lovenox ? ?Code Status: Full code ? ?Family Communication: No family at bedside ? ? ?CONSULTS  ? ? ?Objective  ? ? ?Physical Examination: ? ? ?General-appears in no acute distress ?Heart-S1-S2, regular, no murmur auscultated ?Lungs-clear to auscultation bilaterally, no wheezing or crackles auscultated ?Abdomen-soft, nontender, no organomegaly ?Extremities-no edema in the lower extremities ?Neuro-alert, oriented to self only ? ? ?Status is: Inpatient: Altered mental status ? ? ? ?Pressure Injury 01/08/22 Buttocks Medial Stage 2 -  Partial thickness loss of dermis presenting as a shallow open injury with a red, pink wound bed without slough. (Active)  ?01/08/22 2110  ?Location: Buttocks  ?Location Orientation: Medial  ?Staging: Stage 2 -  Partial thickness loss of dermis presenting as a shallow open injury with a red, pink wound bed without slough.  ?Wound Description (Comments):   ?Present on Admission: Yes  ? ? ? ? ? ? ?Oswald Hillock ?  ?[facility] ?If 7PM-7AM, please contact night-coverage at [email or web address], ?Office  [phone] ? ? ?01/11/2022, 6:16 PM  LOS: 3 days  ? ? ? ? ? ? ? ? ? ? ?  ?

## 2022-01-11 NOTE — [place] FL2 (Signed)
?[place] MEDICAID FL2 LEVEL OF CARE SCREENING TOOL  ?  ? ?IDENTIFICATION  ?Patient Name: ?Julie Mora Birthdate: 02-Mar-1941 Sex: female Admission Date (Current Location): ?01/08/2022  ?[place] and [place] Number: ? [place] ?  Facility and Address:  ?[facility],  [place] [place], [place] ?     Provider Number: ?[id number]  ?Attending Physician Name and Address:  ?Oswald Hillock, MD ? Relative Name and Phone Number:  ?[name] (Sister)   [phone] ?   ?Current Level of Care: ?Hospital Recommended Level of Care: ?[place] Prior Approval Number: ?  ? ?Date Approved/Denied: ?07/27/09 PASRR Number: ?[id number] A ? ?Discharge Plan: ?SNF ?  ? ?Current Diagnoses: ?Patient Active Problem List  ? Diagnosis Date Noted  ? Pressure injury of skin 01/09/2022  ? Hypotension 01/08/2022  ? Wound of lower extremity 01/08/2022  ? AKI (acute kidney injury) ([place]) 01/08/2022  ? Yeast infection of the skin 01/08/2022  ? Acute metabolic encephalopathy 07/04/5101  ? Pneumonia 05/22/2020  ? Cellulitis 05/22/2020  ? Fall at home, initial encounter 05/22/2020  ? Paronychia of right middle finger 10/23/2018  ? Primary osteoarthritis of both first carpometacarpal joints 10/23/2018  ? Spasms of the hands or feet 10/23/2018  ? Pancreatic cyst 06/07/2017  ? UTI (urinary tract infection) 06/07/2017  ? Morbid obesity ([place]) 03/02/2017  ? Poorly controlled diabetes mellitus ([place]) 03/02/2017  ? Stasis dermatitis of both legs 03/02/2017  ? Sepsis ([place]) 01/19/2017  ? Lymphedema of both lower extremities 06/21/2015  ? Venous insufficiency (chronic) (peripheral) 06/21/2015  ? Osteoporosis 12/15/2014  ? PCO (posterior capsular opacification) 10/15/2013  ? Keratitis 08/20/2013  ? Corneal scarring 03/03/2013  ? Metabolic syndrome 58/52/7782  ? History of stroke 05/03/2012  ? Ectropion 10/10/2011  ? Neurotrophic keratoconjunctivitis of both eyes 10/10/2011  ? Pseudophakia 10/10/2011  ? History of deep venous  thrombosis 04/19/2011  ? Vitamin D deficiency 04/19/2011  ? History of iron deficiency 04/19/2011  ? Dependent edema 04/19/2011  ? Mixed stress and urge urinary incontinence 04/19/2011  ? CEREBROVASCULAR ACCIDENT, ACUTE 06/25/2009  ? PEMPHIGUS FOLIACEOUS 06/25/2009  ? Diabetes mellitus ([place]) 05/28/2009  ? HYPERCHOLESTEROLEMIA 05/28/2009  ? Essential hypertension 05/28/2009  ? ? ?Orientation RESPIRATION BLADDER Height & Weight   ?  ?Self ? Normal External catheter Weight: 106.6 kg ?Height:  '5\' 5"'$  (165.1 cm)  ?BEHAVIORAL SYMPTOMS/MOOD NEUROLOGICAL BOWEL NUTRITION STATUS  ?Other (Comment) (restless, agitated at times)   Incontinent Diet (Heart healthy)  ?AMBULATORY STATUS COMMUNICATION OF NEEDS Skin   ?Limited Assist Verbally Normal ?  ?  ?  ?    ?     ?     ? ? ?Personal Care Assistance Level of Assistance  ?Bathing, Feeding, Dressing Bathing Assistance: Limited assistance ?Feeding assistance: Limited assistance ?Dressing Assistance: Limited assistance ?   ? ?Functional Limitations Info  ?Sight, Hearing, Speech Sight Info: Impaired ?Hearing Info: Impaired ?Speech Info: Adequate  ? ? ?SPECIAL CARE FACTORS FREQUENCY  ?PT (By licensed PT), OT (By licensed OT)   ?  ?PT Frequency: 5x/week ?OT Frequency: 5x/week ?  ?  ?  ?   ? ? ?Contractures Contractures Info: Not present  ? ? ?Additional Factors Info  ?Code Status, Allergies, Isolation Precautions Code Status Info: DNR ?Allergies Info: Azathioprine, Mycophenolate Mofetil, Ramipril, Ramipril ?  ?  ?Isolation Precautions Info: Enteric precautions ?   ? ?Current Medications (01/11/2022):  This is the current hospital active medication list ?Current Facility-Administered Medications  ?Medication Dose Route Frequency Provider Last Rate Last  Admin  ? acetaminophen (TYLENOL) tablet 650 mg  650 mg Oral Q6H PRN Tu, Ching T, DO   650 mg at 01/11/22 0120  ? aspirin chewable tablet 81 mg  81 mg Oral Daily Omar Person, NP   81 mg at 01/11/22 1048  ? cefTRIAXone (ROCEPHIN) 2 g  in sodium chloride 0.9 % 100 mL IVPB  2 g Intravenous Daily Tu, Ching T, DO 200 mL/hr at 01/11/22 1047 2 g at 01/11/22 1047  ? chlorhexidine (PERIDEX) 0.12 % solution 15 mL  15 mL Mouth Rinse BID Charlynne Cousins, MD   15 mL at 01/11/22 1048  ? donepezil (ARICEPT) tablet 10 mg  10 mg Oral Daily Omar Person, NP   10 mg at 01/11/22 1048  ? enoxaparin (LOVENOX) injection 40 mg  40 mg Subcutaneous Q24H Tu, Ching T, DO   40 mg at 01/10/22 2259  ? ferrous gluconate (FERGON) tablet 324 mg  324 mg Oral Daily Omar Person, NP   324 mg at 01/11/22 1048  ? gabapentin (NEURONTIN) capsule 100 mg  100 mg Oral QHS Omar Person, NP   100 mg at 01/10/22 2258  ? insulin aspart (novoLOG) injection 0-9 Units  0-9 Units Subcutaneous TID AC & HS Omar Person, NP   1 Units at 01/09/22 2227  ? liver oil-zinc oxide (DESITIN) 40 % ointment   Topical BID Tu, Ching T, DO   Given at 01/11/22 1048  ? magnesium gluconate (MAGONATE) tablet 500 mg  500 mg Oral Daily Omar Person, NP   500 mg at 01/11/22 1048  ? MEDLINE mouth rinse  15 mL Mouth Rinse BID Tu, Ching T, DO   15 mL at 01/11/22 1048  ? MEDLINE mouth rinse  15 mL Mouth Rinse q12n4p Charlynne Cousins, MD   15 mL at 01/11/22 1448  ? memantine (NAMENDA) tablet 5 mg  5 mg Oral BID Omar Person, NP   5 mg at 01/11/22 1048  ? mupirocin ointment (BACTROBAN) 2 % 1 application.  1 application. Nasal BID Charlynne Cousins, MD   1 application. at 01/11/22 1048  ? nystatin cream (MYCOSTATIN)   Topical BID Ileene Musa T, DO   Given at 01/11/22 1048  ? simvastatin (ZOCOR) tablet 20 mg  20 mg Oral QHS Omar Person, NP   20 mg at 01/10/22 2258  ? ? ? ?Discharge Medications: ?Please see discharge summary for a list of discharge medications. ? ?Relevant Imaging Results: ? ?Relevant Lab Results: ? ? ?Additional Information ?SS# [id number]; covid-19 vaccines - Pfizer x3 ? ?Tawanna Cooler, RN ? ? ? ? ?

## 2022-01-11 NOTE — Progress Notes (Signed)
eLink Physician-Brief Progress Note ?Patient Name: Julie Mora ?DOB: 01-03-1941 ?MRN: [id number] ? ? ?Date of Service ? 01/11/2022  ?HPI/Events of Note ? Patient requesting a sleep aid. Heart rate 100 - 130, irregular rhythm r/o atrial fibrillation.  ?eICU Interventions ? 12 lead EKG ordered,  Melatonin 3 mg po x 1 now.  ? ? ? ?  ? ?Kerry Kass [place] ?01/11/2022, 1:01 AM ?

## 2022-01-12 DIAGNOSIS — G9341 Metabolic encephalopathy: Secondary | ICD-10-CM | POA: Diagnosis not present

## 2022-01-12 DIAGNOSIS — N179 Acute kidney failure, unspecified: Secondary | ICD-10-CM | POA: Diagnosis not present

## 2022-01-12 DIAGNOSIS — E1169 Type 2 diabetes mellitus with other specified complication: Secondary | ICD-10-CM | POA: Diagnosis not present

## 2022-01-12 DIAGNOSIS — A419 Sepsis, unspecified organism: Secondary | ICD-10-CM | POA: Diagnosis not present

## 2022-01-12 LAB — CBC
HCT: 34.1 % — ABNORMAL LOW (ref 36.0–46.0)
Hemoglobin: 10.5 g/dL — ABNORMAL LOW (ref 12.0–15.0)
MCH: 30.7 pg (ref 26.0–34.0)
MCHC: 30.8 g/dL (ref 30.0–36.0)
MCV: 99.7 fL (ref 80.0–100.0)
Platelets: 174 10*3/uL (ref 150–400)
RBC: 3.42 MIL/uL — ABNORMAL LOW (ref 3.87–5.11)
RDW: 15.6 % — ABNORMAL HIGH (ref 11.5–15.5)
WBC: 7.1 10*3/uL (ref 4.0–10.5)
nRBC: 0 % (ref 0.0–0.2)

## 2022-01-12 LAB — BASIC METABOLIC PANEL
Anion gap: 2 — ABNORMAL LOW (ref 5–15)
BUN: 31 mg/dL — ABNORMAL HIGH (ref 8–23)
CO2: 21 mmol/L — ABNORMAL LOW (ref 22–32)
Calcium: 7.7 mg/dL — ABNORMAL LOW (ref 8.9–10.3)
Chloride: 116 mmol/L — ABNORMAL HIGH (ref 98–111)
Creatinine, Ser: 1.19 mg/dL — ABNORMAL HIGH (ref 0.44–1.00)
GFR, Estimated: 46 mL/min — ABNORMAL LOW (ref >60)
Glucose, Bld: 155 mg/dL — ABNORMAL HIGH (ref 70–99)
Potassium: 3.2 mmol/L — ABNORMAL LOW (ref 3.5–5.1)
Sodium: 139 mmol/L (ref 135–145)

## 2022-01-12 LAB — MAGNESIUM: Magnesium: 2 mg/dL (ref 1.7–2.4)

## 2022-01-12 LAB — URINE CULTURE: Culture: 30000 — AB

## 2022-01-12 LAB — PHOSPHORUS: Phosphorus: 2 mg/dL — ABNORMAL LOW (ref 2.5–4.6)

## 2022-01-12 LAB — GLUCOSE, CAPILLARY: Glucose-Capillary: 258 mg/dL — ABNORMAL HIGH (ref 70–99)

## 2022-01-12 MED ORDER — LACTATED RINGERS IV SOLN: INTRAVENOUS | Status: DC

## 2022-01-12 MED ORDER — LACTATED RINGERS IV SOLN: INTRAVENOUS | Status: AC

## 2022-01-12 MED ORDER — POTASSIUM CHLORIDE CRYS ER 20 MEQ PO TBCR
40.0000 meq | EXTENDED_RELEASE_TABLET | ORAL | Status: AC
  Administered 2022-01-12 (×2): 40 meq via ORAL
  Filled 2022-01-12 (×2): qty 2

## 2022-01-12 NOTE — Progress Notes (Signed)
Physical Therapy Treatment ?Patient Details ?Name: Julie Mora ?MRN: [id number] ?DOB: [birth year] ?Today's Date: 01/12/2022 ? ? ?History of Present Illness Patient is a 81 year old female who presented with AMS. patient was found to have encephalopathy, AKI, septic shock and UTI.  PMH: DM II, CKD III, HTN, HLD, CVA, DVT, morbid obesity. ? ?  ?PT Comments  ? ? Patient has improved mentation compared to prior session on 4/25 and more agreeable to mobility however remains confused. Pt required Mod assist and cues for sequencing supine>sit and assist to bring LE's fully off EOB and raise trunk upright. Patient was able to hold balance at EOB with assist to position UE's for support. 2+ Mod-Max assist for power up to stand from elevated EOB and pt only able to achieve full stand 1/5 times. Returned to supine with Max+2 assist due to fatigue and need to complete pericare. Pt with NT/RN at EOS in room assisting with pericare. Will continue to progress pt as able. ? ?  ?Recommendations for follow up therapy are one component of a multi-disciplinary discharge planning process, led by the attending physician.  Recommendations may be updated based on patient status, additional functional criteria and insurance authorization. ? ?Follow Up Recommendations ? Skilled nursing-short term rehab (<3 hours/day) ?  ?  ?Assistance Recommended at Discharge Frequent or constant Supervision/Assistance  ?Patient can return home with the following Two people to help with walking and/or transfers;Two people to help with bathing/dressing/bathroom;Assistance with cooking/housework;Assistance with feeding;Direct supervision/assist for medications management;Direct supervision/assist for financial management;Assist for transportation;Help with stairs or ramp for entrance ?  ?Equipment Recommendations ? Other (comment) (TBA)  ?  ?Recommendations for Other Services   ? ? ?  ?Precautions / Restrictions Precautions ?Precautions: Fall ?Precaution  Comments: monitor HR ?Restrictions ?Weight Bearing Restrictions: No  ?  ? ?Mobility ? Bed Mobility ?Overal bed mobility: Needs Assistance ?Bed Mobility: Supine to Sit, Sit to Supine ?  ?  ?Supine to sit: Mod assist, HOB elevated ?Sit to supine: +2 for safety/equipment, +2 for physical assistance, Max assist ?  ?General bed mobility comments: Mod Assist with multimodal cues to initiate bringing LE's off EOB and to reach for bed rail to pull up and raise trunk. bed pad used to pivot hips and sit trunk fully upright initially. Max +2 to return to supine and roll for pericare due to BM. ?  ? ?Transfers ?Overall transfer level: Needs assistance ?Equipment used: Rolling walker (2 wheels) ?Transfers: Sit to/from Stand ?Sit to Stand: Mod assist, +2 physical assistance, +2 safety/equipment, From elevated surface ?  ?  ?  ?  ?  ?General transfer comment: EOB elevated. Mod Assit +2 for safety and to initiate power up. pt required increased assist as she fatigued even with bed elevated and manual blocking of feet to prevent anterior slide. pt's fear of falling limited ability to fully stand. 1 stand, 4 partial stands before return to supine. ?  ? ?Ambulation/Gait ?  ?  ?  ?  ?  ?  ?  ?  ? ? ?Stairs ?  ?  ?  ?  ?  ? ? ?Wheelchair Mobility ?  ? ?Modified Rankin (Stroke Patients Only) ?  ? ? ?  ?Balance Overall balance assessment: Needs assistance ?Sitting-balance support: Feet supported, Bilateral upper extremity supported ?Sitting balance-Leahy Scale: Poor ?  ?Postural control: Posterior lean ?Standing balance support: Reliant on assistive device for balance, Bilateral upper extremity supported ?Standing balance-Leahy Scale: Poor ?  ?  ?  ?  ?  ?  ?  ?  ?  ?  ?  ?  ?  ? ?  ?  Cognition Arousal/Alertness: Awake/alert ?Behavior During Therapy: [place] for tasks assessed/performed ?Overall Cognitive Status: No family/caregiver present to determine baseline cognitive functioning ?  ?  ?  ?  ?  ?  ?  ?  ?  ?  ?  ?  ?  ?  ?  ?  ?General  Comments: pt pleasant today but confused, states "It's a funeral" when asked what she has been watching on tv when it was a commercial for shoes then a tv show. pt aware she is doing better though and responds well to encouragement ?  ?  ? ?  ?Exercises   ? ?  ?General Comments   ?  ?  ? ?Pertinent Vitals/Pain Pain Assessment ?Pain Assessment: Faces ?Faces Pain Scale: Hurts little more ?Pain Location: generalized with supine>sit ?Pain Descriptors / Indicators: Grimacing ?Pain Intervention(s): Limited activity within patient's tolerance, Monitored during session, Repositioned  ? ? ?Home Living   ?  ?  ?  ?  ?  ?  ?  ?  ?  ?   ?  ?Prior Function    ?  ?  ?   ? ?PT Goals (current goals can now be found in the care plan section) Acute Rehab PT Goals ?Patient Stated Goal: unable to state ?PT Goal Formulation: Patient unable to participate in goal setting ?Time For Goal Achievement: 01/24/22 ?Potential to Achieve Goals: Fair ?Progress towards PT goals: Progressing toward goals ? ?  ?Frequency ? ? ? Min 2X/week ? ? ? ?  ?PT Plan Current plan remains appropriate  ? ? ?Co-evaluation PT/OT/SLP Co-Evaluation/Treatment: Yes ?  ?  ?  ?  ? ?  ?AM-PAC PT "6 Clicks" Mobility   ?Outcome Measure ? Help needed turning from your back to your side while in a flat bed without using bedrails?: Total ?Help needed moving from lying on your back to sitting on the side of a flat bed without using bedrails?: Total ?Help needed moving to and from a bed to a chair (including a wheelchair)?: Total ?Help needed standing up from a chair using your arms (e.g., wheelchair or bedside chair)?: Total ?Help needed to walk in hospital room?: Total ?Help needed climbing 3-5 steps with a railing? : Total ?6 Click Score: 6 ? ?  ?End of Session Equipment Utilized During Treatment: Gait belt ?Activity Tolerance: Treatment limited secondary to agitation ?Patient left: in bed;with call bell/phone within reach;with bed alarm set ?Nurse Communication: Mobility  status ?PT Visit Diagnosis: Muscle weakness (generalized) (M62.81);Difficulty in walking, not elsewhere classified (R26.2);Unsteadiness on feet (R26.81) ?  ? ? ?Time: 1610-9604 ?PT Time Calculation (min) (ACUTE ONLY): 34 min ? ?Charges:  $Therapeutic Activity: 23-37 mins          ?          ? ?Gwynneth Albright PT, DPT ?Acute Rehabilitation Services ?Office [phone] ?Pager [phone]  ? ? ?Jacques Navy ?01/12/2022, 3:48 PM ? ?

## 2022-01-12 NOTE — Progress Notes (Signed)
[facility] ? ?PROGRESS NOTE ? ?Julie Mora ZOX:[id number] DOB: 05/31/1941 DOA: 01/08/2022 ?PCP: System, Provider Not In ? ? ?Brief HPI:   ?81 y.o. female with medical history significant of CVA, HTN, Type 2 diabetes, HLD who presents from ALF at request of sister for AMS.  ?She requested patient be brought to the hospital today due to altered mental status and being confused.  She last saw her normal about 4 days ago.  However over the weekend patient has been having profuse diarrhea with known GI infection in the facility.  Patient is also been on Bactrim for the past 2 days for possible lower extremity wound and UTI.  She has been following wound care outpatient for bilateral lower leg with chronic lymphedema. ? ?In the ED she was febrile with temperature 101.2 ?F, tachypnea, tachycardia, hypotension 80/50 which improved with IV fluids.  UA was positive for nitrate, moderate leukocytosis and more than 50 RBCs per high-power field, more than 50 WBC and many bacteria. ?Patient was admitted with sepsis, started on IV vancomycin, Rocephin and Flagyl, blood cultures were obtained. ? ?On 4/24 morning patient was found to be hypotensive with MAP between 60-68, also had tachycardia.  PCCM was consulted patient was transferred to ICU and started on Levophed. ? ?She was transitioned off Levophed and moved to medical floor. ? ?TSH resumed care on 01/11/2022 ? ? ?Subjective  ? ?Patient seen and examined, no new complaints. ? ? Assessment/Plan:  ? ? ?Septic shock ?-Secondary to sepsis due to UTI, along with dehydration ?-Initially required Levophed, has been weaned off Levophed ?-Urine culture growing 30,000 colonies per mL of Klebsiella pneumoniae, 20,000 colonies per mL of Enterococcus faecalis ?-Enterococcus faecalis is resistant to vancomycin, however it is likely due to colonization  ?-Will not repeat further antibiotics unless patient develops fever or becomes symptomatic ?-Sepsis physiology has  resolved ? ?Metabolic encephalopathy ?-Improved, likely at baseline ?-Continue to monitor ? ?Acute kidney injury ?-In setting of hypoperfusion due to hypotension ?-Improved with IV fluids ?-Creatinine has improved to 1.19 ? ?Hypokalemia ?-Replace potassium and follow BMP in am ? ?Hypophosphatemia ?-Replace phosphorus and check phosphorus level in a.m. ? ?Diarrhea ?-Resolved ?-Likely viral gastroenteritis ? ?Diabetes mellitus type 2 ?-Continue sliding scale insulin NovoLog ? ?Chronic lymphedema/chronic venous stasis changes ?-Wound care consulted ? ?Dementia ?-No behavior disturbance ?-Continue donepezil ? ? ? ?Medications ? ?  ? aspirin  81 mg Oral Daily  ? chlorhexidine  15 mL Mouth Rinse BID  ? donepezil  10 mg Oral Daily  ? enoxaparin (LOVENOX) injection  40 mg Subcutaneous Q24H  ? ferrous gluconate  324 mg Oral Daily  ? gabapentin  100 mg Oral QHS  ? insulin aspart  0-9 Units Subcutaneous TID AC & HS  ? liver oil-zinc oxide   Topical BID  ? magnesium gluconate  500 mg Oral Daily  ? mouth rinse  15 mL Mouth Rinse BID  ? mouth rinse  15 mL Mouth Rinse q12n4p  ? memantine  5 mg Oral BID  ? mupirocin ointment  1 application. Nasal BID  ? nystatin cream   Topical BID  ? phosphorus  250 mg Oral TID  ? potassium chloride  40 mEq Oral Q4H  ? simvastatin  20 mg Oral QHS  ? ? ? Data Reviewed:  ? ?CBG: ? ?Recent Labs  ?Lab 01/09/22 ?2202 01/10/22 ?0754 01/10/22 ?1211 01/10/22 ?2113 01/11/22 ?2116  ?GLUCAP 137* 99 162* 100* 152*  ? ? ?SpO2: 97 % ?O2 Flow Rate (L/min):  2 L/min  ? ? ?Vitals:  ? 01/11/22 1450 01/11/22 2025 01/12/22 0444 01/12/22 0736  ?BP: 112/78 96/69 101/69 107/77  ?Pulse: 69  (!) 107 (!) 110  ?Resp: '20 16 18 16  '$ ?Temp: 97.9 ?F (36.6 ?C) 98.1 ?F (36.7 ?C) (!) 97.5 ?F (36.4 ?C) 97.7 ?F (36.5 ?C)  ?TempSrc: Axillary Oral Oral Oral  ?SpO2: 97% 94% 97% 97%  ?Weight:      ?Height:      ? ? ? ? ?Data Reviewed: ? ?Basic Metabolic Panel: ?Recent Labs  ?Lab 01/09/22 ?1027 01/09/22 ?2536 01/10/22 ?0301 01/11/22 ?6440  01/12/22 ?0444  ?NA 140 139 140 140 139  ?K 3.3* 4.6 3.6 3.5 3.2*  ?CL 112* 111 115* 115* 116*  ?CO2 22 22 21* 21* 21*  ?GLUCOSE 83 83 92 92 155*  ?BUN 41* 36* 32* 31* 31*  ?CREATININE 1.41* 1.44* 1.41* 1.20* 1.19*  ?CALCIUM 7.5* 7.9* 8.1* 8.1* 7.7*  ?MG  --  1.8 2.0 2.0 2.0  ?PHOS  --  3.4 2.1* 1.9* 2.0*  ? ? ?CBC: ?Recent Labs  ?Lab 01/08/22 ?1650 01/09/22 ?1050 01/10/22 ?0301 01/11/22 ?3474 01/12/22 ?0444  ?WBC 9.0 7.7 7.1 7.4 7.1  ?NEUTROABS 7.4  --   --   --   --   ?HGB 13.7 11.1* 11.4* 10.7* 10.5*  ?HCT 43.9 34.7* 35.4* 33.3* 34.1*  ?MCV 100.7* 100.3* 98.9 99.1 99.7  ?PLT 241 170 170 170 174  ? ? ?LFT ?Recent Labs  ?Lab 01/08/22 ?1650 01/09/22 ?0922  ?AST 74* 157*  ?ALT 17 28  ?ALKPHOS 75 81  ?BILITOT 0.8 1.7*  ?PROT 5.9* 5.3*  ?ALBUMIN 1.8* 3.2*  ? ?  ?Antibiotics: ?Anti-infectives (From admission, onward)  ? ? Start     Dose/Rate Route Frequency Ordered Stop  ? 01/10/22 1800  vancomycin (VANCOREADY) IVPB 1250 mg/250 mL  Status:  Discontinued       ? 1,250 mg ?166.7 mL/hr over 90 Minutes Intravenous Every 48 hours 01/08/22 2007 01/10/22 1154  ? 01/09/22 1000  cefTRIAXone (ROCEPHIN) 2 g in sodium chloride 0.9 % 100 mL IVPB       ? 2 g ?200 mL/hr over 30 Minutes Intravenous Daily 01/08/22 2003    ? 01/09/22 0600  metroNIDAZOLE (FLAGYL) IVPB 500 mg  Status:  Discontinued       ? 500 mg ?100 mL/hr over 60 Minutes Intravenous Every 12 hours 01/08/22 1945 01/09/22 0952  ? 01/08/22 2200  cefTRIAXone (ROCEPHIN) 2 g in sodium chloride 0.9 % 100 mL IVPB  Status:  Discontinued       ? 2 g ?200 mL/hr over 30 Minutes Intravenous Daily 01/08/22 1959 01/08/22 2003  ? 01/08/22 2000  cefTRIAXone (ROCEPHIN) 1 g in sodium chloride 0.9 % 100 mL IVPB  Status:  Discontinued       ? 1 g ?200 mL/hr over 30 Minutes Intravenous Daily 01/08/22 1945 01/08/22 1959  ? 01/08/22 1700  ceFEPIme (MAXIPIME) 2 g in sodium chloride 0.9 % 100 mL IVPB  Status:  Discontinued       ? 2 g ?200 mL/hr over 30 Minutes Intravenous  Once 01/08/22 1648  01/08/22 1715  ? 01/08/22 1700  metroNIDAZOLE (FLAGYL) IVPB 500 mg       ? 500 mg ?100 mL/hr over 60 Minutes Intravenous  Once 01/08/22 1648 01/08/22 1825  ? 01/08/22 1700  vancomycin (VANCOCIN) IVPB 1000 mg/200 mL premix  Status:  Discontinued       ? 1,000 mg ?200 mL/hr over 60  Minutes Intravenous  Once 01/08/22 1648 01/08/22 1716  ? 01/08/22 1700  ceFEPIme (MAXIPIME) 2 g in sodium chloride 0.9 % 100 mL IVPB       ? 2 g ?200 mL/hr over 30 Minutes Intravenous  Once 01/08/22 1654 01/08/22 1751  ? 01/08/22 1700  vancomycin (VANCOREADY) IVPB 2000 mg/400 mL       ? 2,000 mg ?200 mL/hr over 120 Minutes Intravenous  Once 01/08/22 1654 01/08/22 2025  ? ?  ? ? ? ?DVT prophylaxis: Lovenox ? ?Code Status: Full code ? ?Family Communication: No family at bedside ? ? ?CONSULTS  ? ? ?Objective  ? ? ?Physical Examination: ? ? ?General-appears in no acute distress ?Heart-S1-S2, regular, no murmur auscultated ?Lungs-clear to auscultation bilaterally, no wheezing or crackles auscultated ?Abdomen-soft, nontender, no organomegaly ?Extremities-no edema in the lower extremities ?Neuro-alert, oriented to self only ? ? ?Status is: Inpatient: Altered mental status ? ? ? ?Pressure Injury 01/08/22 Buttocks Medial Stage 2 -  Partial thickness loss of dermis presenting as a shallow open injury with a red, pink wound bed without slough. (Active)  ?01/08/22 2110  ?Location: Buttocks  ?Location Orientation: Medial  ?Staging: Stage 2 -  Partial thickness loss of dermis presenting as a shallow open injury with a red, pink wound bed without slough.  ?Wound Description (Comments):   ?Present on Admission: Yes  ? ? ? ? ? ? ?Oswald Hillock ?  ?[facility] ?If 7PM-7AM, please contact night-coverage at [email or web address], ?Office  [phone] ? ? ?01/12/2022, 1:48 PM  LOS: 4 days  ? ? ? ? ? ? ? ? ? ? ?  ?

## 2022-01-12 NOTE — TOC Progression Note (Addendum)
Transition of Care (TOC) - Progression Note  ? ? ?Patient Details  ?Name: Julie Mora ?MRN: [id number] ?Date of Birth: 29-Jan-1941 ? ?Transition of Care (TOC) CM/SW Contact  ?Tawanna Cooler, RN ?Phone Number: ?01/12/2022, 12:57 PM ? ?Clinical Narrative:    ? ?Spoke with patient's sister [name].  Gave her the bed offers.  [name] said she had a bad experience with her mother as [facility], so she knows she doesn't want that one.  States she will look into the facilities and call CM back with a choice.  Also said she has made an appt with an Elder Law attorney to help her figure out the plan after rehab.  ? ?1444 Addendum: Received a call from [place].  She states her choice is [facility].  Messaged admissions rep Kelli to let her know.  [employer] health and started [place].  Reference #[id number].   ? ? ?

## 2022-01-12 NOTE — Care Management Important Message (Signed)
Important Message ? ?Patient Details IM Letter placed in Patients room. ?Name: Julie Mora ?MRN: [id number] ?Date of Birth: 1940-10-30 ? ? ?Medicare Important Message Given:  Yes ? ? ? ? ?Kerin Salen ?01/12/2022, 11:50 AM ?

## 2022-01-13 DIAGNOSIS — E1169 Type 2 diabetes mellitus with other specified complication: Secondary | ICD-10-CM | POA: Diagnosis not present

## 2022-01-13 DIAGNOSIS — N179 Acute kidney failure, unspecified: Secondary | ICD-10-CM | POA: Diagnosis not present

## 2022-01-13 DIAGNOSIS — A419 Sepsis, unspecified organism: Secondary | ICD-10-CM | POA: Diagnosis not present

## 2022-01-13 DIAGNOSIS — G9341 Metabolic encephalopathy: Secondary | ICD-10-CM | POA: Diagnosis not present

## 2022-01-13 LAB — BASIC METABOLIC PANEL
Anion gap: 6 (ref 5–15)
BUN: 26 mg/dL — ABNORMAL HIGH (ref 8–23)
CO2: 22 mmol/L (ref 22–32)
Calcium: 8 mg/dL — ABNORMAL LOW (ref 8.9–10.3)
Chloride: 114 mmol/L — ABNORMAL HIGH (ref 98–111)
Creatinine, Ser: 0.98 mg/dL (ref 0.44–1.00)
GFR, Estimated: 58 mL/min — ABNORMAL LOW (ref >60)
Glucose, Bld: 97 mg/dL (ref 70–99)
Potassium: 4 mmol/L (ref 3.5–5.1)
Sodium: 142 mmol/L (ref 135–145)

## 2022-01-13 LAB — GLUCOSE, CAPILLARY
Glucose-Capillary: 98 mg/dL (ref 70–99)
Glucose-Capillary: 135 mg/dL — ABNORMAL HIGH (ref 70–99)
Glucose-Capillary: 155 mg/dL — ABNORMAL HIGH (ref 70–99)

## 2022-01-13 LAB — PHOSPHORUS: Phosphorus: 2.4 mg/dL — ABNORMAL LOW (ref 2.5–4.6)

## 2022-01-13 LAB — MAGNESIUM: Magnesium: 1.9 mg/dL (ref 1.7–2.4)

## 2022-01-13 LAB — CULTURE, BLOOD (ROUTINE X 2)
Culture: NO GROWTH
Culture: NO GROWTH
Special Requests: ADEQUATE

## 2022-01-13 MED ORDER — SODIUM CHLORIDE 0.9 % IV SOLN
2.0000 g | INTRAVENOUS | Status: AC
  Administered 2022-01-13: 2 g via INTRAVENOUS
  Filled 2022-01-13 (×2): qty 20

## 2022-01-13 NOTE — TOC Progression Note (Signed)
Transition of Care (TOC) - Progression Note  ? ? ?Patient Details  ?Name: Julie Mora ?MRN: [id number] ?Date of Birth: 1941-08-28 ? ?Transition of Care (TOC) CM/SW Contact  ?Karlea Mckibbin, Marjie Skiff, RN ?Phone Number: ?01/13/2022, 1:24 PM ? ?Clinical Narrative:    ?Insurance auth for SNF still pending with [employer]. TOC will continue to follow. ?

## 2022-01-13 NOTE — Progress Notes (Signed)
[facility] ? ?PROGRESS NOTE ? ?Julie Mora TMH:[id number] DOB: 18-Sep-1941 DOA: 01/08/2022 ?PCP: System, Provider Not In ? ? ?Brief HPI:   ?81 y.o. female with medical history significant of CVA, HTN, Type 2 diabetes, HLD who presents from ALF at request of sister for AMS.  ?She requested patient be brought to the hospital today due to altered mental status and being confused.  She last saw her normal about 4 days ago.  However over the weekend patient has been having profuse diarrhea with known GI infection in the facility.  Patient is also been on Bactrim for the past 2 days for possible lower extremity wound and UTI.  She has been following wound care outpatient for bilateral lower leg with chronic lymphedema. ? ?In the ED she was febrile with temperature 101.2 ?F, tachypnea, tachycardia, hypotension 80/50 which improved with IV fluids.  UA was positive for nitrate, moderate leukocytosis and more than 50 RBCs per high-power field, more than 50 WBC and many bacteria. ?Patient was admitted with sepsis, started on IV vancomycin, Rocephin and Flagyl, blood cultures were obtained. ? ?On 4/24 morning patient was found to be hypotensive with MAP between 60-68, also had tachycardia.  PCCM was consulted patient was transferred to ICU and started on Levophed. ? ?She was transitioned off Levophed and moved to medical floor. ? ?TSH resumed care on 01/11/2022 ? ? ?Subjective  ? ?Patient seen and examined, no new complaints. ? ? Assessment/Plan:  ? ? ?Septic shock ?-Secondary to sepsis due to UTI, along with dehydration ?-Initially required Levophed, has been weaned off Levophed ?-Urine culture growing 30,000 colonies per mL of Klebsiella pneumoniae, 20,000 colonies per mL of Enterococcus faecalis ?-Enterococcus faecalis is resistant to vancomycin, however it is likely due to colonization  ?-Will not repeat further antibiotics unless patient develops fever or becomes symptomatic ?-Sepsis physiology has  resolved ?-Continue ceftriaxone for total 7 days of treatment ? ?Metabolic encephalopathy ?-Improved, likely at baseline ?-Continue to monitor ? ?Acute kidney injury ?-In setting of hypoperfusion due to hypotension ?-Improved with IV fluids ?-Creatinine has improved to 1 0.98 ? ?Hypokalemia ?-Replete ?Hypophosphatemia ?-Replace phosphorus and check phosphorus level in a.m. ? ?Diarrhea ?-Resolved ?-Likely viral gastroenteritis ? ?Diabetes mellitus type 2 ?-Continue sliding scale insulin NovoLog ? ?Chronic lymphedema/chronic venous stasis changes ?-Wound care consulted ? ?Dementia ?-No behavior disturbance ?-Continue donepezil ? ? ? ?Medications ? ?  ? aspirin  81 mg Oral Daily  ? chlorhexidine  15 mL Mouth Rinse BID  ? donepezil  10 mg Oral Daily  ? enoxaparin (LOVENOX) injection  40 mg Subcutaneous Q24H  ? ferrous gluconate  324 mg Oral Daily  ? gabapentin  100 mg Oral QHS  ? insulin aspart  0-9 Units Subcutaneous TID AC & HS  ? liver oil-zinc oxide   Topical BID  ? magnesium gluconate  500 mg Oral Daily  ? mouth rinse  15 mL Mouth Rinse BID  ? mouth rinse  15 mL Mouth Rinse q12n4p  ? memantine  5 mg Oral BID  ? mupirocin ointment  1 application. Nasal BID  ? nystatin cream   Topical BID  ? simvastatin  20 mg Oral QHS  ? ? ? Data Reviewed:  ? ?CBG: ? ?Recent Labs  ?Lab 01/10/22 ?2113 01/11/22 ?2116 01/12/22 ?1618 01/13/22 ?0736 01/13/22 ?1144  ?GLUCAP 100* 152* 258* 98 135*  ? ? ?SpO2: 91 % ?O2 Flow Rate (L/min): 2 L/min  ? ? ?Vitals:  ? 01/12/22 0736 01/12/22 1608 01/13/22 0327 01/13/22  0327  ?BP: 107/77 139/87  100/73  ?Pulse: (!) 110 79 100 98  ?Resp: 16 (!) 22  15  ?Temp: 97.7 ?F (36.5 ?C) 98.8 ?F (37.1 ?C) 97.8 ?F (36.6 ?C) 97.8 ?F (36.6 ?C)  ?TempSrc: Oral Axillary Oral Oral  ?SpO2: 97% 96%  91%  ?Weight:      ?Height:      ? ? ? ? ?Data Reviewed: ? ?Basic Metabolic Panel: ?Recent Labs  ?Lab 01/09/22 ?0922 01/10/22 ?0301 01/11/22 ?8453 01/12/22 ?6468 01/13/22 ?0458  ?NA 139 140 140 139 142  ?K 4.6 3.6 3.5 3.2*  4.0  ?CL 111 115* 115* 116* 114*  ?CO2 22 21* 21* 21* 22  ?GLUCOSE 83 92 92 155* 97  ?BUN 36* 32* 31* 31* 26*  ?CREATININE 1.44* 1.41* 1.20* 1.19* 0.98  ?CALCIUM 7.9* 8.1* 8.1* 7.7* 8.0*  ?MG 1.8 2.0 2.0 2.0 1.9  ?PHOS 3.4 2.1* 1.9* 2.0* 2.4*  ? ? ?CBC: ?Recent Labs  ?Lab 01/08/22 ?1650 01/09/22 ?1050 01/10/22 ?0301 01/11/22 ?0321 01/12/22 ?0444  ?WBC 9.0 7.7 7.1 7.4 7.1  ?NEUTROABS 7.4  --   --   --   --   ?HGB 13.7 11.1* 11.4* 10.7* 10.5*  ?HCT 43.9 34.7* 35.4* 33.3* 34.1*  ?MCV 100.7* 100.3* 98.9 99.1 99.7  ?PLT 241 170 170 170 174  ? ? ?LFT ?Recent Labs  ?Lab 01/08/22 ?1650 01/09/22 ?0922  ?AST 74* 157*  ?ALT 17 28  ?ALKPHOS 75 81  ?BILITOT 0.8 1.7*  ?PROT 5.9* 5.3*  ?ALBUMIN 1.8* 3.2*  ? ?  ?Antibiotics: ?Anti-infectives (From admission, onward)  ? ? Start     Dose/Rate Route Frequency Ordered Stop  ? 01/13/22 1045  cefTRIAXone (ROCEPHIN) 2 g in sodium chloride 0.9 % 100 mL IVPB       ? 2 g ?200 mL/hr over 30 Minutes Intravenous Every 24 hours 01/13/22 0953 01/15/22 1044  ? 01/10/22 1800  vancomycin (VANCOREADY) IVPB 1250 mg/250 mL  Status:  Discontinued       ? 1,250 mg ?166.7 mL/hr over 90 Minutes Intravenous Every 48 hours 01/08/22 2007 01/10/22 1154  ? 01/09/22 1000  cefTRIAXone (ROCEPHIN) 2 g in sodium chloride 0.9 % 100 mL IVPB  Status:  Discontinued       ? 2 g ?200 mL/hr over 30 Minutes Intravenous Daily 01/08/22 2003 01/13/22 0952  ? 01/09/22 0600  metroNIDAZOLE (FLAGYL) IVPB 500 mg  Status:  Discontinued       ? 500 mg ?100 mL/hr over 60 Minutes Intravenous Every 12 hours 01/08/22 1945 01/09/22 0952  ? 01/08/22 2200  cefTRIAXone (ROCEPHIN) 2 g in sodium chloride 0.9 % 100 mL IVPB  Status:  Discontinued       ? 2 g ?200 mL/hr over 30 Minutes Intravenous Daily 01/08/22 1959 01/08/22 2003  ? 01/08/22 2000  cefTRIAXone (ROCEPHIN) 1 g in sodium chloride 0.9 % 100 mL IVPB  Status:  Discontinued       ? 1 g ?200 mL/hr over 30 Minutes Intravenous Daily 01/08/22 1945 01/08/22 1959  ? 01/08/22 1700  ceFEPIme  (MAXIPIME) 2 g in sodium chloride 0.9 % 100 mL IVPB  Status:  Discontinued       ? 2 g ?200 mL/hr over 30 Minutes Intravenous  Once 01/08/22 1648 01/08/22 1715  ? 01/08/22 1700  metroNIDAZOLE (FLAGYL) IVPB 500 mg       ? 500 mg ?100 mL/hr over 60 Minutes Intravenous  Once 01/08/22 1648 01/08/22 1825  ? 01/08/22 1700  vancomycin (  VANCOCIN) IVPB 1000 mg/200 mL premix  Status:  Discontinued       ? 1,000 mg ?200 mL/hr over 60 Minutes Intravenous  Once 01/08/22 1648 01/08/22 1716  ? 01/08/22 1700  ceFEPIme (MAXIPIME) 2 g in sodium chloride 0.9 % 100 mL IVPB       ? 2 g ?200 mL/hr over 30 Minutes Intravenous  Once 01/08/22 1654 01/08/22 1751  ? 01/08/22 1700  vancomycin (VANCOREADY) IVPB 2000 mg/400 mL       ? 2,000 mg ?200 mL/hr over 120 Minutes Intravenous  Once 01/08/22 1654 01/08/22 2025  ? ?  ? ? ? ?DVT prophylaxis: Lovenox ? ?Code Status: Full code ? ?Family Communication: No family at bedside ? ? ?CONSULTS  ? ? ?Objective  ? ? ?Physical Examination: ? ? ?General-appears in no acute distress ?Heart-S1-S2, regular, no murmur auscultated ?Lungs-clear to auscultation bilaterally, no wheezing or crackles auscultated ?Abdomen-soft, nontender, no organomegaly ?Extremities-no edema in the lower extremities ?Neuro-alert, oriented to self only ? ? ?Status is: Inpatient: Altered mental status ? ? ? ?Pressure Injury 01/08/22 Buttocks Medial Stage 2 -  Partial thickness loss of dermis presenting as a shallow open injury with a red, pink wound bed without slough. (Active)  ?01/08/22 2110  ?Location: Buttocks  ?Location Orientation: Medial  ?Staging: Stage 2 -  Partial thickness loss of dermis presenting as a shallow open injury with a red, pink wound bed without slough.  ?Wound Description (Comments):   ?Present on Admission: Yes  ? ? ? ? ? ? ?Oswald Hillock ?  ?[facility] ?If 7PM-7AM, please contact night-coverage at [email or web address], ?Office  [phone] ? ? ?01/13/2022, 1:35 PM  LOS: 5 days  ? ? ? ? ? ? ? ? ? ? ?  ?

## 2022-01-13 NOTE — Plan of Care (Signed)

## 2022-01-14 DIAGNOSIS — N179 Acute kidney failure, unspecified: Secondary | ICD-10-CM | POA: Diagnosis not present

## 2022-01-14 DIAGNOSIS — A419 Sepsis, unspecified organism: Secondary | ICD-10-CM | POA: Diagnosis not present

## 2022-01-14 DIAGNOSIS — G9341 Metabolic encephalopathy: Secondary | ICD-10-CM | POA: Diagnosis not present

## 2022-01-14 DIAGNOSIS — E1169 Type 2 diabetes mellitus with other specified complication: Secondary | ICD-10-CM | POA: Diagnosis not present

## 2022-01-14 LAB — GLUCOSE, CAPILLARY
Glucose-Capillary: 117 mg/dL — ABNORMAL HIGH (ref 70–99)
Glucose-Capillary: 87 mg/dL (ref 70–99)
Glucose-Capillary: 88 mg/dL (ref 70–99)
Glucose-Capillary: 123 mg/dL — ABNORMAL HIGH (ref 70–99)
Glucose-Capillary: 141 mg/dL — ABNORMAL HIGH (ref 70–99)
Glucose-Capillary: 117 mg/dL — ABNORMAL HIGH (ref 70–99)
Glucose-Capillary: 166 mg/dL — ABNORMAL HIGH (ref 70–99)
Glucose-Capillary: 227 mg/dL — ABNORMAL HIGH (ref 70–99)
Glucose-Capillary: 106 mg/dL — ABNORMAL HIGH (ref 70–99)
Glucose-Capillary: 127 mg/dL — ABNORMAL HIGH (ref 70–99)
Glucose-Capillary: 113 mg/dL — ABNORMAL HIGH (ref 70–99)
Glucose-Capillary: 119 mg/dL — ABNORMAL HIGH (ref 70–99)

## 2022-01-14 LAB — BASIC METABOLIC PANEL
Anion gap: 5 (ref 5–15)
BUN: 8 mg/dL (ref 8–23)
CO2: 20 mmol/L — ABNORMAL LOW (ref 22–32)
Calcium: 7.9 mg/dL — ABNORMAL LOW (ref 8.9–10.3)
Chloride: 111 mmol/L (ref 98–111)
Creatinine, Ser: 0.72 mg/dL (ref 0.44–1.00)
GFR, Estimated: >60 mL/min (ref >60)
Glucose, Bld: 113 mg/dL — ABNORMAL HIGH (ref 70–99)
Potassium: 4.3 mmol/L (ref 3.5–5.1)
Sodium: 136 mmol/L (ref 135–145)

## 2022-01-14 LAB — CBC
HCT: 39 % (ref 36.0–46.0)
Hemoglobin: 12.2 g/dL (ref 12.0–15.0)
MCH: 31.3 pg (ref 26.0–34.0)
MCHC: 31.3 g/dL (ref 30.0–36.0)
MCV: 100 fL (ref 80.0–100.0)
Platelets: 287 10*3/uL (ref 150–400)
RBC: 3.9 MIL/uL (ref 3.87–5.11)
RDW: 15.8 % — ABNORMAL HIGH (ref 11.5–15.5)
WBC: 9.1 10*3/uL (ref 4.0–10.5)
nRBC: 0 % (ref 0.0–0.2)

## 2022-01-14 LAB — MAGNESIUM: Magnesium: 2.2 mg/dL (ref 1.7–2.4)

## 2022-01-14 LAB — PHOSPHORUS: Phosphorus: 2.1 mg/dL — ABNORMAL LOW (ref 2.5–4.6)

## 2022-01-14 MED ORDER — K PHOS MONO-SOD PHOS DI & MONO 155-852-130 MG PO TABS
250.0000 mg | ORAL_TABLET | Freq: Three times a day (TID) | ORAL | Status: AC
  Administered 2022-01-14 – 2022-01-15 (×5): 250 mg via ORAL
  Filled 2022-01-14 (×6): qty 1

## 2022-01-14 MED ORDER — SODIUM CHLORIDE 0.9 % IV SOLN: INTRAVENOUS | Status: DC

## 2022-01-14 NOTE — Progress Notes (Signed)
[facility] ? ?PROGRESS NOTE ? ?Areej Tayler CNO:[id number] DOB: [birth year] DOA: 01/08/2022 ?PCP: System, Provider Not In ? ? ?Brief HPI:   ?81 y.o. female with medical history significant of CVA, HTN, Type 2 diabetes, HLD who presents from ALF at request of sister for AMS.  ?She requested patient be brought to the hospital today due to altered mental status and being confused.  She last saw her normal about 4 days ago.  However over the weekend patient has been having profuse diarrhea with known GI infection in the facility.  Patient is also been on Bactrim for the past 2 days for possible lower extremity wound and UTI.  She has been following wound care outpatient for bilateral lower leg with chronic lymphedema. ? ?In the ED she was febrile with temperature 101.2 ?F, tachypnea, tachycardia, hypotension 80/50 which improved with IV fluids.  UA was positive for nitrate, moderate leukocytosis and more than 50 RBCs per high-power field, more than 50 WBC and many bacteria. ?Patient was admitted with sepsis, started on IV vancomycin, Rocephin and Flagyl, blood cultures were obtained. ? ?On 4/24 morning patient was found to be hypotensive with MAP between 60-68, also had tachycardia.  PCCM was consulted patient was transferred to ICU and started on Levophed. ? ?She was transitioned off Levophed and moved to medical floor. ? ?TSH resumed care on 01/11/2022 ? ? ?Subjective  ? ?Patient seen and examined, no new complaints. ? ? Assessment/Plan:  ? ? ?Septic shock ?-Secondary to sepsis due to UTI, along with dehydration ?-Initially required Levophed, has been weaned off Levophed ?-Urine culture growing 30,000 colonies per mL of Klebsiella pneumoniae, 20,000 colonies per mL of Enterococcus faecalis ?-Enterococcus faecalis is resistant to vancomycin, however it is likely due to colonization  ?-Will not repeat further antibiotics unless patient develops fever or becomes symptomatic ?-Sepsis physiology has  resolved ?-Treated with ceftriaxone for 7 days ? ?Metabolic encephalopathy ?-Improved, likely at baseline ?-Continue to monitor ? ?Acute kidney injury ?-In setting of hypoperfusion due to hypotension ?-Improved with IV fluids ?-Creatinine has improved to 1 0.98 ?-Continue IV normal saline at 100 mm/h ? ?Hypokalemia ?-Replete ?Hypophosphatemia ?-Replace phosphorus and check phosphorus level in a.m. ? ?Diarrhea ?-Resolved ?-Likely viral gastroenteritis ? ?Diabetes mellitus type 2 ?-Continue sliding scale insulin NovoLog ? ?Chronic lymphedema/chronic venous stasis changes ?-Wound care consulted ? ?Dementia ?-No behavior disturbance ?-Continue donepezil ? ? ? ?Medications ? ?  ? aspirin  81 mg Oral Daily  ? chlorhexidine  15 mL Mouth Rinse BID  ? donepezil  10 mg Oral Daily  ? enoxaparin (LOVENOX) injection  40 mg Subcutaneous Q24H  ? ferrous gluconate  324 mg Oral Daily  ? gabapentin  100 mg Oral QHS  ? insulin aspart  0-9 Units Subcutaneous TID AC & HS  ? liver oil-zinc oxide   Topical BID  ? magnesium gluconate  500 mg Oral Daily  ? mouth rinse  15 mL Mouth Rinse BID  ? mouth rinse  15 mL Mouth Rinse q12n4p  ? memantine  5 mg Oral BID  ? nystatin cream   Topical BID  ? phosphorus  250 mg Oral TID  ? simvastatin  20 mg Oral QHS  ? ? ? Data Reviewed:  ? ?CBG: ? ?Recent Labs  ?Lab 01/11/22 ?2116 01/12/22 ?1618 01/13/22 ?0736 01/13/22 ?1144 01/13/22 ?2215  ?GLUCAP 152* 258* 98 135* 155*  ? ? ?SpO2: 99 % ?O2 Flow Rate (L/min): 2 L/min  ? ? ?Vitals:  ? 01/13/22 0327 01/13/22  2223 01/14/22 0455 01/14/22 1321  ?BP: 100/73 96/63 127/84 90/67  ?Pulse: 98 100 90 (!) 115  ?Resp: '15 20 20 20  '$ ?Temp: 97.8 ?F (36.6 ?C) 98.3 ?F (36.8 ?C) 98.1 ?F (36.7 ?C) 97.8 ?F (36.6 ?C)  ?TempSrc: Oral Oral Oral Oral  ?SpO2: 91% 96% 100% 99%  ?Weight:      ?Height:      ? ? ? ? ?Data Reviewed: ? ?Basic Metabolic Panel: ?Recent Labs  ?Lab 01/09/22 ?0922 01/10/22 ?0301 01/11/22 ?5361 01/12/22 ?0444 01/13/22 ?0458 01/14/22 ?4431  ?NA 139 140 140 139  142  --   ?K 4.6 3.6 3.5 3.2* 4.0  --   ?CL 111 115* 115* 116* 114*  --   ?CO2 22 21* 21* 21* 22  --   ?GLUCOSE 83 92 92 155* 97  --   ?BUN 36* 32* 31* 31* 26*  --   ?CREATININE 1.44* 1.41* 1.20* 1.19* 0.98  --   ?CALCIUM 7.9* 8.1* 8.1* 7.7* 8.0*  --   ?MG 1.8 2.0 2.0 2.0 1.9 2.2  ?PHOS 3.4 2.1* 1.9* 2.0* 2.4* 2.1*  ? ? ?CBC: ?Recent Labs  ?Lab 01/08/22 ?1650 01/09/22 ?1050 01/10/22 ?0301 01/11/22 ?5400 01/12/22 ?0444  ?WBC 9.0 7.7 7.1 7.4 7.1  ?NEUTROABS 7.4  --   --   --   --   ?HGB 13.7 11.1* 11.4* 10.7* 10.5*  ?HCT 43.9 34.7* 35.4* 33.3* 34.1*  ?MCV 100.7* 100.3* 98.9 99.1 99.7  ?PLT 241 170 170 170 174  ? ? ?LFT ?Recent Labs  ?Lab 01/08/22 ?1650 01/09/22 ?0922  ?AST 74* 157*  ?ALT 17 28  ?ALKPHOS 75 81  ?BILITOT 0.8 1.7*  ?PROT 5.9* 5.3*  ?ALBUMIN 1.8* 3.2*  ? ?  ?Antibiotics: ?Anti-infectives (From admission, onward)  ? ? Start     Dose/Rate Route Frequency Ordered Stop  ? 01/13/22 1045  cefTRIAXone (ROCEPHIN) 2 g in sodium chloride 0.9 % 100 mL IVPB       ? 2 g ?200 mL/hr over 30 Minutes Intravenous Every 24 hours 01/13/22 0953 01/15/22 1044  ? 01/10/22 1800  vancomycin (VANCOREADY) IVPB 1250 mg/250 mL  Status:  Discontinued       ? 1,250 mg ?166.7 mL/hr over 90 Minutes Intravenous Every 48 hours 01/08/22 2007 01/10/22 1154  ? 01/09/22 1000  cefTRIAXone (ROCEPHIN) 2 g in sodium chloride 0.9 % 100 mL IVPB  Status:  Discontinued       ? 2 g ?200 mL/hr over 30 Minutes Intravenous Daily 01/08/22 2003 01/13/22 0952  ? 01/09/22 0600  metroNIDAZOLE (FLAGYL) IVPB 500 mg  Status:  Discontinued       ? 500 mg ?100 mL/hr over 60 Minutes Intravenous Every 12 hours 01/08/22 1945 01/09/22 0952  ? 01/08/22 2200  cefTRIAXone (ROCEPHIN) 2 g in sodium chloride 0.9 % 100 mL IVPB  Status:  Discontinued       ? 2 g ?200 mL/hr over 30 Minutes Intravenous Daily 01/08/22 1959 01/08/22 2003  ? 01/08/22 2000  cefTRIAXone (ROCEPHIN) 1 g in sodium chloride 0.9 % 100 mL IVPB  Status:  Discontinued       ? 1 g ?200 mL/hr over 30 Minutes  Intravenous Daily 01/08/22 1945 01/08/22 1959  ? 01/08/22 1700  ceFEPIme (MAXIPIME) 2 g in sodium chloride 0.9 % 100 mL IVPB  Status:  Discontinued       ? 2 g ?200 mL/hr over 30 Minutes Intravenous  Once 01/08/22 1648 01/08/22 1715  ? 01/08/22 1700  metroNIDAZOLE (  FLAGYL) IVPB 500 mg       ? 500 mg ?100 mL/hr over 60 Minutes Intravenous  Once 01/08/22 1648 01/08/22 1825  ? 01/08/22 1700  vancomycin (VANCOCIN) IVPB 1000 mg/200 mL premix  Status:  Discontinued       ? 1,000 mg ?200 mL/hr over 60 Minutes Intravenous  Once 01/08/22 1648 01/08/22 1716  ? 01/08/22 1700  ceFEPIme (MAXIPIME) 2 g in sodium chloride 0.9 % 100 mL IVPB       ? 2 g ?200 mL/hr over 30 Minutes Intravenous  Once 01/08/22 1654 01/08/22 1751  ? 01/08/22 1700  vancomycin (VANCOREADY) IVPB 2000 mg/400 mL       ? 2,000 mg ?200 mL/hr over 120 Minutes Intravenous  Once 01/08/22 1654 01/08/22 2025  ? ?  ? ? ? ?DVT prophylaxis: Lovenox ? ?Code Status: Full code ? ?Family Communication: No family at bedside ? ? ?CONSULTS  ? ? ?Objective  ? ? ?Physical Examination: ? ? ?General-appears in no acute distress ?Heart-S1-S2, regular, no murmur auscultated ?Lungs-clear to auscultation bilaterally, no wheezing or crackles auscultated ?Abdomen-soft, nontender, no organomegaly ?Extremities-no edema in the lower extremities ?Neuro-alert, oriented to self only ? ? ?Status is: Inpatient: Altered mental status ? ? ? ?Pressure Injury 01/08/22 Buttocks Medial Stage 2 -  Partial thickness loss of dermis presenting as a shallow open injury with a red, pink wound bed without slough. (Active)  ?01/08/22 2110  ?Location: Buttocks  ?Location Orientation: Medial  ?Staging: Stage 2 -  Partial thickness loss of dermis presenting as a shallow open injury with a red, pink wound bed without slough.  ?Wound Description (Comments):   ?Present on Admission: Yes  ? ? ? ? ? ? ?Oswald Hillock ?  ?[facility] ?If 7PM-7AM, please contact night-coverage at [email or web address], ?Office   [phone] ? ? ?01/14/2022, 3:30 PM  LOS: 6 days  ? ? ? ? ? ? ? ? ? ? ?  ?

## 2022-01-14 NOTE — TOC Progression Note (Signed)
Transition of Care (TOC) - Progression Note  ? ? ?Patient Details  ?Name: Julie Mora ?MRN: [id number] ?Date of Birth: 10-Jan-1941 ? ?Transition of Care (TOC) CM/SW Contact  ?Ross Ludwig, LCSW ?Phone Number: ?01/14/2022, 4:25 PM ? ?Clinical Narrative:    ? ?Insurance authorization still pending. ? ?  ?  ? ?Expected Discharge Plan and Services ?  ?  ?  ?  ?  ?                ?  ?  ?  ?  ?  ?  ?  ?  ?  ?  ? ? ?Social Determinants of Health (SDOH) Interventions ?  ? ?Readmission Risk Interventions ?   ? View : No data to display.  ?  ?  ?  ? ? ?

## 2022-01-15 DIAGNOSIS — A419 Sepsis, unspecified organism: Secondary | ICD-10-CM | POA: Diagnosis not present

## 2022-01-15 DIAGNOSIS — N179 Acute kidney failure, unspecified: Secondary | ICD-10-CM | POA: Diagnosis not present

## 2022-01-15 DIAGNOSIS — E1169 Type 2 diabetes mellitus with other specified complication: Secondary | ICD-10-CM | POA: Diagnosis not present

## 2022-01-15 DIAGNOSIS — G9341 Metabolic encephalopathy: Secondary | ICD-10-CM | POA: Diagnosis not present

## 2022-01-15 LAB — MAGNESIUM: Magnesium: 1.9 mg/dL (ref 1.7–2.4)

## 2022-01-15 LAB — PHOSPHORUS: Phosphorus: 2.1 mg/dL — ABNORMAL LOW (ref 2.5–4.6)

## 2022-01-15 MED ORDER — SODIUM CHLORIDE 0.9 % IV SOLN: INTRAVENOUS | Status: AC

## 2022-01-15 NOTE — Progress Notes (Signed)
Physical Therapy Treatment ?Patient Details ?Name: Julie Mora ?MRN: [id number] ?DOB: 22-Jan-1941 ?Today's Date: 01/15/2022 ? ? ?History of Present Illness Patient is a 81 year old female who presented with AMS. patient was found to have encephalopathy, AKI, septic shock and UTI.  PMH: DM II, CKD III, HTN, HLD, CVA, DVT, morbid obesity. ? ?  ?PT Comments  ? ? General Comments: AxO x 2 following all commands.  Able to share how long she was living at [facility] and she would push herself in  wheelchair to dinning room for "some" meals.  She stated she was able to "pull" her self up on a bar to perofrm toilet transfers.  she stated she did not walk, only a few "side steps". Assisted OOB to recliner was difficult.  General bed mobility comments: pt required + 2 Max Assist to transfer from supine to EOB with increased assist needed to complete scooting.  Once partially upright EOB, pt was present with posterior lean and increased anxiety/fear of falling. General transfer comment: first attempted a sit to stand from elevated bed + 2 side by side assist, however pt was unable to clear hips off bed due to weakness/fatigue and increased anxiety.  Used MaxiMove to transfer from EOB to [facility] for a BM then to recliner. Positioned in recliner to comfort. ?Pt will need ST Rehab at SNF prior to returning to ALF level to address her decline in mobility and self ability to perform transfers. ?  ?Recommendations for follow up therapy are one component of a multi-disciplinary discharge planning process, led by the attending physician.  Recommendations may be updated based on patient status, additional functional criteria and insurance authorization. ? ?Follow Up Recommendations ? Skilled nursing-short term rehab (<3 hours/day) ?  ?  ?Assistance Recommended at Discharge    ?Patient can return home with the following Two people to help with walking and/or transfers;Two people to help with bathing/dressing/bathroom;Assistance with  cooking/housework;Assistance with feeding;Direct supervision/assist for medications management;Direct supervision/assist for financial management;Assist for transportation;Help with stairs or ramp for entrance ?  ?Equipment Recommendations ?    ?  ?Recommendations for Other Services   ? ? ?  ?Precautions / Restrictions Precautions ?Precaution Comments: monitor HR ?Restrictions ?Weight Bearing Restrictions: No  ?  ? ?Mobility ? Bed Mobility ?Overal bed mobility: Needs Assistance ?Bed Mobility: Supine to Sit ?  ?  ?Supine to sit: Max assist, +2 for physical assistance, +2 for safety/equipment ?  ?  ?General bed mobility comments: pt required + 2 Max Assist to transfer from supine to EOB with increased assist needed to complete scooting.  Once partially upright EOB, pt was present with posterior lean and increased anxiety/fear of falling. ?  ? ?Transfers ?Overall transfer level: Needs assistance ?Equipment used: Rolling walker (2 wheels) ?Transfers: Sit to/from Stand ?Sit to Stand: From elevated surface ?  ?  ?  ?  ?  ?General transfer comment: first attempted a sit to stand from elevated bed + 2 side by side assist, however pt was unable to clear hips off bed due to weakness/fatigue and increased anxiety.  Used MaxiMove to transfer from EOB to [facility] for a BM then to recliner. ?  ? ?Ambulation/Gait ?  ?  ?  ?  ?  ?  ?  ?General Gait Details: transfers only ? ? ?Stairs ?  ?  ?  ?  ?  ? ? ?Wheelchair Mobility ?  ? ?Modified Rankin (Stroke Patients Only) ?  ? ? ?  ?Balance   ?  ?  ?  ?  ?  ?  ?  ?  ?  ?  ?  ?  ?  ?  ?  ?  ?  ?  ?  ? ?  ?  Cognition Arousal/Alertness: Awake/alert ?Behavior During Therapy: [facility] for tasks assessed/performed ?Overall Cognitive Status: No family/caregiver present to determine baseline cognitive functioning ?  ?  ?  ?  ?  ?  ?  ?  ?  ?  ?  ?  ?  ?  ?  ?  ?General Comments: AxO x 2 following all commands.  Able to share how long she was living at [facility] and she would push herself in   wheelchair to dinning room for "some" meals.  She stated she was able to "pull" her self up on a bar to perofrm toilet transfers.  she stated she did not walk, only a few "side steps". ?  ?  ? ?  ?Exercises   ? ?  ?General Comments   ?  ?  ? ?Pertinent Vitals/Pain Pain Assessment ?Pain Assessment: Faces ?Faces Pain Scale: Hurts a little bit ?Pain Location: generalized plus B LE ?Pain Descriptors / Indicators: Aching, Discomfort ?Pain Intervention(s): Monitored during session, Repositioned  ? ? ?Home Living   ?  ?  ?  ?  ?  ?  ?  ?  ?  ?   ?  ?Prior Function    ?  ?  ?   ? ?PT Goals (current goals can now be found in the care plan section) Progress towards PT goals: Progressing toward goals ? ?  ?Frequency ? ? ? Min 2X/week ? ? ? ?  ?PT Plan Current plan remains appropriate  ? ? ?Co-evaluation   ?  ?  ?  ?  ? ?  ?AM-PAC PT "6 Clicks" Mobility   ?Outcome Measure ? Help needed turning from your back to your side while in a flat bed without using bedrails?: A Lot ?Help needed moving from lying on your back to sitting on the side of a flat bed without using bedrails?: A Lot ?Help needed moving to and from a bed to a chair (including a wheelchair)?: Total ?Help needed standing up from a chair using your arms (e.g., wheelchair or bedside chair)?: Total ?Help needed to walk in hospital room?: Total ?Help needed climbing 3-5 steps with a railing? : Total ?6 Click Score: 8 ? ?  ?End of Session Equipment Utilized During Treatment: Gait belt ?Activity Tolerance: Patient limited by fatigue ?Patient left: in chair;with chair alarm set;with call bell/phone within reach ?Nurse Communication: Mobility status;Need for lift equipment ?PT Visit Diagnosis: Muscle weakness (generalized) (M62.81);Difficulty in walking, not elsewhere classified (R26.2);Unsteadiness on feet (R26.81) ?  ? ? ?Time: 4098-1191 ?PT Time Calculation (min) (ACUTE ONLY): 32 min ? ?Charges:  $Therapeutic Activity: 23-37 mins          ?          ? ?{Julie Mora   PTA ?Acute  Rehabilitation Services ?Pager      [phone] ?Office      [phone] ? ?

## 2022-01-15 NOTE — Progress Notes (Signed)
?   01/15/22 0913  ?Assess: MEWS Score  ?Temp 98.5 ?F (36.9 ?C)  ?BP (!) 112/53 ?[facility] LPN notified of vitals)  ?Pulse Rate (!) 120 ?[facility] LPN notified of vitals)  ?Resp (!) 22 ?([facility] LPN notified of vitals)  ?Level of Consciousness Alert  ?SpO2 94 %  ?Assess: MEWS Score  ?MEWS Temp 0  ?MEWS Systolic 0  ?MEWS Pulse 2  ?MEWS RR 1  ?MEWS LOC 0  ?MEWS Score 3  ?MEWS Score Color Yellow  ?Assess: if the MEWS score is Yellow or Red  ?Were vital signs taken at a resting state? Yes  ?Focused Assessment No change from prior assessment  ?Does the patient meet 2 or more of the SIRS criteria? Yes  ?Does the patient have a confirmed or suspected source of infection? Yes  ?Provider and Rapid Response Notified? No  ?MEWS guidelines implemented *See Row Information* No, previously yellow, continue vital signs every 4 hours  ?Treat  ?Pain Scale 0-10  ?Pain Score 0  ?Document  ?Patient Outcome Other (Comment)  ?Progress note created (see row info) Yes  ?Assess: SIRS CRITERIA  ?SIRS Temperature  0  ?SIRS Pulse 1  ?SIRS Respirations  1  ?SIRS WBC 0  ?SIRS Score Sum  2  ? ? ?

## 2022-01-15 NOTE — TOC Progression Note (Addendum)
Transition of Care (TOC) - Progression Note  ? ? ?Patient Details  ?Name: Julie Mora ?MRN: [id number] ?Date of Birth: 1940/12/20 ? ?Transition of Care (TOC) CM/SW Contact  ?Ross Ludwig, LCSW ?Phone Number: ?01/15/2022, 4:25 PM ? ?Clinical Narrative:    ? ?CSW sent updated clinicals to [facility] to review.  CSW awaiting on insurance authorization.  Patient is from [facility]. ? ? ?  ?  ? ?Expected Discharge Plan and Services ?  ?  ?  ?  ?  ?                ?  ?  ?  ?  ?  ?  ?  ?  ?  ?  ? ? ?Social Determinants of Health (SDOH) Interventions ?  ? ?Readmission Risk Interventions ?   ? View : No data to display.  ?  ?  ?  ? ? ?

## 2022-01-15 NOTE — Progress Notes (Signed)
?   01/15/22 0913  ?Assess: MEWS Score  ?Temp 98.5 ?F (36.9 ?C)  ?BP (!) 112/53 ?[facility] LPN notified of vitals)  ?Pulse Rate (!) 120 ?[facility] LPN notified of vitals)  ?Resp (!) 22 ?(Elsy Chiang LPN notified of vitals)  ?Level of Consciousness Alert  ?SpO2 94 %  ?Assess: MEWS Score  ?MEWS Temp 0  ?MEWS Systolic 0  ?MEWS Pulse 2  ?MEWS RR 1  ?MEWS LOC 0  ?MEWS Score 3  ?MEWS Score Color Yellow  ?Assess: if the MEWS score is Yellow or Red  ?Were vital signs taken at a resting state? Yes  ?Focused Assessment No change from prior assessment  ?Does the patient meet 2 or more of the SIRS criteria? Yes  ?Does the patient have a confirmed or suspected source of infection? Yes  ?Provider and Rapid Response Notified? No  ?MEWS guidelines implemented *See Row Information* No, previously yellow, continue vital signs every 4 hours  ?Treat  ?Pain Scale 0-10  ?Pain Score 0  ?Notify: Provider  ?Provider Name/Title Dr. Darrick Meigs  ?Date Provider Notified 01/15/22  ?Time Provider Notified 1000  ?Notification Type Face-to-face  ?Notification Reason Other (Comment)  ?Provider response See new orders  ?Date of Provider Response 01/15/22  ?Time of Provider Response 1030  ?Document  ?Patient Outcome Other (Comment)  ?Progress note created (see row info) Yes  ?Assess: SIRS CRITERIA  ?SIRS Temperature  0  ?SIRS Pulse 1  ?SIRS Respirations  1  ?SIRS WBC 0  ?SIRS Score Sum  2  ? ? ?

## 2022-01-15 NOTE — Progress Notes (Signed)
[facility] ? ?PROGRESS NOTE ? ?Julie Mora BWL:[id number] DOB: [birth year] DOA: 01/08/2022 ?PCP: System, Provider Not In ? ? ?Brief HPI:   ? ?81 y.o. female with medical history significant of CVA, HTN, Type 2 diabetes, HLD who presents from ALF at request of sister for AMS.  ?She requested patient be brought to the hospital today due to altered mental status and being confused.  She last saw her normal about 4 days ago.  However over the weekend patient has been having profuse diarrhea with known GI infection in the facility.  Patient is also been on Bactrim for the past 2 days for possible lower extremity wound and UTI.  She has been following wound care outpatient for bilateral lower leg with chronic lymphedema. ? ?In the ED she was febrile with temperature 101.2 ?F, tachypnea, tachycardia, hypotension 80/50 which improved with IV fluids.  UA was positive for nitrate, moderate leukocytosis and more than 50 RBCs per high-power field, more than 50 WBC and many bacteria. ?Patient was admitted with sepsis, started on IV vancomycin, Rocephin and Flagyl, blood cultures were obtained. ? ?On 4/24 morning patient was found to be hypotensive with MAP between 60-68, also had tachycardia.  PCCM was consulted patient was transferred to ICU and started on Levophed. ? ?She was transitioned off Levophed and moved to medical floor. ? ?TSH resumed care on 01/11/2022 ? ? ?Subjective  ? ?Patient seen and examined, continues to have mild tachycardia ? ? Assessment/Plan:  ? ? ?Septic shock ?-Secondary to sepsis due to UTI, along with dehydration ?-Initially required Levophed, has been weaned off Levophed ?-Urine culture growing 30,000 colonies per mL of Klebsiella pneumoniae, 20,000 colonies per mL of Enterococcus faecalis ?-Enterococcus faecalis is resistant to vancomycin, however it is likely due to colonization  ?-Will not repeat further antibiotics unless patient develops fever or becomes symptomatic ?-Sepsis physiology has  resolved ?-Treated with ceftriaxone for 7 days ? ?Metabolic encephalopathy ?-Improved, likely at baseline ?-Continue to monitor ? ?Acute kidney injury ?-In setting of hypoperfusion due to hypotension ?-Improved with IV fluids ?-Creatinine has improved to 1 0.98 ?-Continue IV normal saline at 100 mm/h ? ?Hypokalemia ?-Replete ? ?Hypophosphatemia ?-Replace phosphorus and check phosphorus level in a.m. ? ?Diarrhea ?-Resolved ?-Likely viral gastroenteritis ? ?Diabetes mellitus type 2 ?-Continue sliding scale insulin NovoLog ? ?Chronic lymphedema/chronic venous stasis changes ?-Wound care consulted ? ?Dementia ?-No behavior disturbance ?-Continue donepezil ? ? ? ?Medications ? ?  ? aspirin  81 mg Oral Daily  ? chlorhexidine  15 mL Mouth Rinse BID  ? donepezil  10 mg Oral Daily  ? enoxaparin (LOVENOX) injection  40 mg Subcutaneous Q24H  ? ferrous gluconate  324 mg Oral Daily  ? gabapentin  100 mg Oral QHS  ? insulin aspart  0-9 Units Subcutaneous TID AC & HS  ? liver oil-zinc oxide   Topical BID  ? magnesium gluconate  500 mg Oral Daily  ? mouth rinse  15 mL Mouth Rinse BID  ? mouth rinse  15 mL Mouth Rinse q12n4p  ? memantine  5 mg Oral BID  ? nystatin cream   Topical BID  ? phosphorus  250 mg Oral TID  ? simvastatin  20 mg Oral QHS  ? ? ? Data Reviewed:  ? ?CBG: ? ?Recent Labs  ?Lab 01/13/22 ?2215 01/14/22 ?6811 01/14/22 ?1157 01/14/22 ?1657 01/14/22 ?2113  ?GLUCAP 155* 106* 127* 113* 119*  ? ? ?SpO2: 94 % ?O2 Flow Rate (L/min): 2 L/min  ? ? ?Vitals:  ?  01/15/22 0867 01/15/22 6195 01/15/22 0932 01/15/22 0913  ?BP: 118/77  111/69 (!) 112/53  ?Pulse: (!) 113 (!) 111 (!) 114 (!) 120  ?Resp: 18  (!) 24 (!) 22  ?Temp: 97.7 ?F (36.5 ?C)  97.9 ?F (36.6 ?C) 98.5 ?F (36.9 ?C)  ?TempSrc: Oral  Oral Axillary  ?SpO2: 98% 97% 98% 94%  ?Weight:      ?Height:      ? ? ? ? ?Data Reviewed: ? ?Basic Metabolic Panel: ?Recent Labs  ?Lab 01/10/22 ?0301 01/11/22 ?6712 01/12/22 ?0444 01/13/22 ?0458 01/14/22 ?4580 01/14/22 ?1600 01/15/22 ?0702   ?NA 140 140 139 142  --  136  --   ?K 3.6 3.5 3.2* 4.0  --  4.3  --   ?CL 115* 115* 116* 114*  --  111  --   ?CO2 21* 21* 21* 22  --  20*  --   ?GLUCOSE 92 92 155* 97  --  113*  --   ?BUN 32* 31* 31* 26*  --  8  --   ?CREATININE 1.41* 1.20* 1.19* 0.98  --  0.72  --   ?CALCIUM 8.1* 8.1* 7.7* 8.0*  --  7.9*  --   ?MG 2.0 2.0 2.0 1.9 2.2  --  1.9  ?PHOS 2.1* 1.9* 2.0* 2.4* 2.1*  --  2.1*  ? ? ?CBC: ?Recent Labs  ?Lab 01/08/22 ?1650 01/09/22 ?1050 01/10/22 ?0301 01/11/22 ?9983 01/12/22 ?0444 01/14/22 ?1818  ?WBC 9.0 7.7 7.1 7.4 7.1 9.1  ?NEUTROABS 7.4  --   --   --   --   --   ?HGB 13.7 11.1* 11.4* 10.7* 10.5* 12.2  ?HCT 43.9 34.7* 35.4* 33.3* 34.1* 39.0  ?MCV 100.7* 100.3* 98.9 99.1 99.7 100.0  ?PLT 241 170 170 170 174 287  ? ? ?LFT ?Recent Labs  ?Lab 01/08/22 ?1650 01/09/22 ?0922  ?AST 74* 157*  ?ALT 17 28  ?ALKPHOS 75 81  ?BILITOT 0.8 1.7*  ?PROT 5.9* 5.3*  ?ALBUMIN 1.8* 3.2*  ? ?  ?Antibiotics: ?Anti-infectives (From admission, onward)  ? ? Start     Dose/Rate Route Frequency Ordered Stop  ? 01/13/22 1045  cefTRIAXone (ROCEPHIN) 2 g in sodium chloride 0.9 % 100 mL IVPB       ? 2 g ?200 mL/hr over 30 Minutes Intravenous Every 24 hours 01/13/22 0953 01/15/22 1044  ? 01/10/22 1800  vancomycin (VANCOREADY) IVPB 1250 mg/250 mL  Status:  Discontinued       ? 1,250 mg ?166.7 mL/hr over 90 Minutes Intravenous Every 48 hours 01/08/22 2007 01/10/22 1154  ? 01/09/22 1000  cefTRIAXone (ROCEPHIN) 2 g in sodium chloride 0.9 % 100 mL IVPB  Status:  Discontinued       ? 2 g ?200 mL/hr over 30 Minutes Intravenous Daily 01/08/22 2003 01/13/22 0952  ? 01/09/22 0600  metroNIDAZOLE (FLAGYL) IVPB 500 mg  Status:  Discontinued       ? 500 mg ?100 mL/hr over 60 Minutes Intravenous Every 12 hours 01/08/22 1945 01/09/22 0952  ? 01/08/22 2200  cefTRIAXone (ROCEPHIN) 2 g in sodium chloride 0.9 % 100 mL IVPB  Status:  Discontinued       ? 2 g ?200 mL/hr over 30 Minutes Intravenous Daily 01/08/22 1959 01/08/22 2003  ? 01/08/22 2000  cefTRIAXone  (ROCEPHIN) 1 g in sodium chloride 0.9 % 100 mL IVPB  Status:  Discontinued       ? 1 g ?200 mL/hr over 30 Minutes Intravenous Daily 01/08/22 1945 01/08/22 1959  ?  01/08/22 1700  ceFEPIme (MAXIPIME) 2 g in sodium chloride 0.9 % 100 mL IVPB  Status:  Discontinued       ? 2 g ?200 mL/hr over 30 Minutes Intravenous  Once 01/08/22 1648 01/08/22 1715  ? 01/08/22 1700  metroNIDAZOLE (FLAGYL) IVPB 500 mg       ? 500 mg ?100 mL/hr over 60 Minutes Intravenous  Once 01/08/22 1648 01/08/22 1825  ? 01/08/22 1700  vancomycin (VANCOCIN) IVPB 1000 mg/200 mL premix  Status:  Discontinued       ? 1,000 mg ?200 mL/hr over 60 Minutes Intravenous  Once 01/08/22 1648 01/08/22 1716  ? 01/08/22 1700  ceFEPIme (MAXIPIME) 2 g in sodium chloride 0.9 % 100 mL IVPB       ? 2 g ?200 mL/hr over 30 Minutes Intravenous  Once 01/08/22 1654 01/08/22 1751  ? 01/08/22 1700  vancomycin (VANCOREADY) IVPB 2000 mg/400 mL       ? 2,000 mg ?200 mL/hr over 120 Minutes Intravenous  Once 01/08/22 1654 01/08/22 2025  ? ?  ? ? ? ?DVT prophylaxis: Lovenox ? ?Code Status: Full code ? ?Family Communication: No family at bedside ? ? ?CONSULTS  ? ? ?Objective  ? ? ?Physical Examination: ? ? ?General-appears in no acute distress ?Heart-S1-S2, regular, no murmur auscultated ?Lungs-clear to auscultation bilaterally, no wheezing or crackles auscultated ?Abdomen-soft, nontender, no organomegaly ?Extremities-no edema in the lower extremities ?Neuro-alert, oriented to self only ? ? ?Status is: Inpatient: Altered mental status ? ? ? ?Pressure Injury 01/08/22 Buttocks Medial Stage 2 -  Partial thickness loss of dermis presenting as a shallow open injury with a red, pink wound bed without slough. (Active)  ?01/08/22 2110  ?Location: Buttocks  ?Location Orientation: Medial  ?Staging: Stage 2 -  Partial thickness loss of dermis presenting as a shallow open injury with a red, pink wound bed without slough.  ?Wound Description (Comments):   ?Present on Admission: Yes   ? ? ? ? ? ? ?Oswald Hillock ?  ?[facility] ?If 7PM-7AM, please contact night-coverage at [email or web address], ?Office  [phone] ? ? ?01/15/2022, 2:04 PM  LOS: 7 days  ? ? ? ? ? ? ? ? ? ? ?  ?

## 2022-01-15 NOTE — Progress Notes (Signed)
?   01/15/22 0709  ?Assess: MEWS Score  ?Temp 97.9 ?F (36.6 ?C)  ?BP 111/69  ?Pulse Rate (!) 114  ?Resp (!) 24  ?Level of Consciousness Alert  ?SpO2 98 %  ?O2 Device Room Air  ?Assess: if the MEWS score is Yellow or Red  ?Were vital signs taken at a resting state? Yes  ?Focused Assessment No change from prior assessment  ?Does the patient meet 2 or more of the SIRS criteria? Yes  ?Does the patient have a confirmed or suspected source of infection? Yes  ?Provider and Rapid Response Notified? No  ?MEWS guidelines implemented *See Row Information* Yes  ?Treat  ?Pain Scale 0-10  ?Pain Score 0  ?Take Vital Signs  ?Increase Vital Sign Frequency  Yellow: Q 2hr X 2 then Q 4hr X 2, if remains yellow, continue Q 4hrs  ?Escalate  ?MEWS: Escalate Yellow: discuss with charge nurse/RN and consider discussing with provider and RRT  ?Notify: Charge Nurse/RN  ?Name of Charge Nurse/RN Notified Abigail Butts , RN  ?Date Charge Nurse/RN Notified 01/15/22  ?Time Charge Nurse/RN Notified 0720  ?Document  ?Patient Outcome Other (Comment)  ?Progress note created (see row info) Yes  ?Assess: SIRS CRITERIA  ?SIRS Temperature  0  ?SIRS Pulse 1  ?SIRS Respirations  1  ?SIRS WBC 0  ?SIRS Score Sum  2  ? ? ?

## 2022-01-16 DIAGNOSIS — G9341 Metabolic encephalopathy: Secondary | ICD-10-CM | POA: Diagnosis not present

## 2022-01-16 DIAGNOSIS — E1169 Type 2 diabetes mellitus with other specified complication: Secondary | ICD-10-CM | POA: Diagnosis not present

## 2022-01-16 DIAGNOSIS — A419 Sepsis, unspecified organism: Secondary | ICD-10-CM | POA: Diagnosis not present

## 2022-01-16 DIAGNOSIS — N179 Acute kidney failure, unspecified: Secondary | ICD-10-CM | POA: Diagnosis not present

## 2022-01-16 LAB — GLUCOSE, CAPILLARY
Glucose-Capillary: 94 mg/dL (ref 70–99)
Glucose-Capillary: 335 mg/dL — ABNORMAL HIGH (ref 70–99)
Glucose-Capillary: 114 mg/dL — ABNORMAL HIGH (ref 70–99)
Glucose-Capillary: 125 mg/dL — ABNORMAL HIGH (ref 70–99)

## 2022-01-16 LAB — MAGNESIUM: Magnesium: 1.8 mg/dL (ref 1.7–2.4)

## 2022-01-16 LAB — PHOSPHORUS: Phosphorus: 3.3 mg/dL (ref 2.5–4.6)

## 2022-01-16 NOTE — TOC Progression Note (Addendum)
Transition of Care (TOC) - Progression Note  ? ?Patient Details  ?Name: [name] ?MRN: [id number] ?Date of Birth: 01-16-1941 ? ?Transition of Care (TOC) CM/SW Contact  ?Sherie Don, LCSW ?Phone Number: ?01/16/2022, 1:26 PM ? ?Clinical Narrative: TOC notified by Bernadene Bell that information regarding patient's prior level of functioning was needed prior to completing the SNF review as well as whether the hospitalist is willing to complete a peer to peer if needed. CSW spoke with sister and sister confirmed patient was using a [employer] fast streamer at [facility] and was a [employer]. Hospitalist is agreeable to P2P if needed. CSW provided requested info in [place] portal and submitted it for review. TOC awaiting update from [place]. ? ?Addendum: CSW received call from [place] with [facility] that patient has been approved for SNF. Patient is on contact precautions and will need a private room, which will be available tomorrow. ? ?Readmission Risk Interventions ?   ? View : No data to display.  ?  ?  ?  ? ? ?

## 2022-01-16 NOTE — Progress Notes (Signed)
[facility] ? ?PROGRESS NOTE ? ?Julie Mora XBM:[id number] DOB: 07-13-1941 DOA: 01/08/2022 ?PCP: System, Provider Not In ? ? ?Brief HPI:   ? ?81 y.o. female with medical history significant of CVA, HTN, Type 2 diabetes, HLD who presents from ALF at request of sister for AMS.  ?She requested patient be brought to the hospital today due to altered mental status and being confused.  She last saw her normal about 4 days ago.  However over the weekend patient has been having profuse diarrhea with known GI infection in the facility.  Patient is also been on Bactrim for the past 2 days for possible lower extremity wound and UTI.  She has been following wound care outpatient for bilateral lower leg with chronic lymphedema. ? ?In the ED she was febrile with temperature 101.2 ?F, tachypnea, tachycardia, hypotension 80/50 which improved with IV fluids.  UA was positive for nitrate, moderate leukocytosis and more than 50 RBCs per high-power field, more than 50 WBC and many bacteria. ?Patient was admitted with sepsis, started on IV vancomycin, Rocephin and Flagyl, blood cultures were obtained. ? ?On 4/24 morning patient was found to be hypotensive with MAP between 60-68, also had tachycardia.  PCCM was consulted patient was transferred to ICU and started on Levophed. ? ?She was transitioned off Levophed and moved to medical floor. ? ?TSH resumed care on 01/11/2022 ? ? ?Subjective  ? ?Patient seen and examined, no new complaints. ? ? Assessment/Plan:  ? ? ?Septic shock ?-Secondary to sepsis due to UTI, along with dehydration ?-Initially required Levophed, has been weaned off Levophed ?-Urine culture growing 30,000 colonies per mL of Klebsiella pneumoniae, 20,000 colonies per mL of Enterococcus faecalis ?-Enterococcus faecalis is resistant to vancomycin, however it is likely due to colonization  ?-Will not repeat further antibiotics unless patient develops fever or becomes symptomatic ?-Sepsis physiology has  resolved ?-Treated with ceftriaxone for 7 days ? ?Metabolic encephalopathy ?-Improved, likely at baseline ?-Continue to monitor ? ?Acute kidney injury ?-In setting of hypoperfusion due to hypotension ?-Improved with IV fluids ?-Creatinine has improved to 1 0.98 ? ? ?Hypokalemia ?-Replete ? ?Hypophosphatemia ?-Replete ? ?Diarrhea ?-Resolved ?-Likely viral gastroenteritis ? ?Diabetes mellitus type 2 ?-Continue sliding scale insulin NovoLog ? ?Chronic lymphedema/chronic venous stasis changes ?-Wound care consulted ? ?Dementia ?-No behavior disturbance ?-Continue donepezil ? ? ? ?Medications ? ?  ? aspirin  81 mg Oral Daily  ? chlorhexidine  15 mL Mouth Rinse BID  ? donepezil  10 mg Oral Daily  ? enoxaparin (LOVENOX) injection  40 mg Subcutaneous Q24H  ? ferrous gluconate  324 mg Oral Daily  ? gabapentin  100 mg Oral QHS  ? insulin aspart  0-9 Units Subcutaneous TID AC & HS  ? liver oil-zinc oxide   Topical BID  ? magnesium gluconate  500 mg Oral Daily  ? mouth rinse  15 mL Mouth Rinse BID  ? mouth rinse  15 mL Mouth Rinse q12n4p  ? memantine  5 mg Oral BID  ? nystatin cream   Topical BID  ? simvastatin  20 mg Oral QHS  ? ? ? Data Reviewed:  ? ?CBG: ? ?Recent Labs  ?Lab 01/14/22 ?1157 01/14/22 ?1657 01/14/22 ?2113 01/16/22 ?0272 01/16/22 ?1225  ?GLUCAP 127* 113* 119* 94 335*  ? ? ?SpO2: 99 % ?O2 Flow Rate (L/min): 2 L/min  ? ? ?Vitals:  ? 01/15/22 2019 01/16/22 0119 01/16/22 0520 01/16/22 1514  ?BP: (!) 134/100 127/86 134/74 124/76  ?Pulse: (!) 126 (!) 112 89  95  ?Resp: '19 19 16 17  '$ ?Temp: 98.5 ?F (36.9 ?C) 98 ?F (36.7 ?C) 97.8 ?F (36.6 ?C) 97.8 ?F (36.6 ?C)  ?TempSrc: Oral Oral Oral Oral  ?SpO2: 94% 97% 95% 99%  ?Weight:      ?Height:      ? ? ? ? ?Data Reviewed: ? ?Basic Metabolic Panel: ?Recent Labs  ?Lab 01/10/22 ?0301 01/11/22 ?9675 01/12/22 ?0444 01/13/22 ?0458 01/14/22 ?9163 01/14/22 ?1600 01/15/22 ?8466 01/16/22 ?0432  ?NA 140 140 139 142  --  136  --   --   ?K 3.6 3.5 3.2* 4.0  --  4.3  --   --   ?CL 115* 115*  116* 114*  --  111  --   --   ?CO2 21* 21* 21* 22  --  20*  --   --   ?GLUCOSE 92 92 155* 97  --  113*  --   --   ?BUN 32* 31* 31* 26*  --  8  --   --   ?CREATININE 1.41* 1.20* 1.19* 0.98  --  0.72  --   --   ?CALCIUM 8.1* 8.1* 7.7* 8.0*  --  7.9*  --   --   ?MG 2.0 2.0 2.0 1.9 2.2  --  1.9 1.8  ?PHOS 2.1* 1.9* 2.0* 2.4* 2.1*  --  2.1* 3.3  ? ? ?CBC: ?Recent Labs  ?Lab 01/10/22 ?0301 01/11/22 ?5993 01/12/22 ?0444 01/14/22 ?1818  ?WBC 7.1 7.4 7.1 9.1  ?HGB 11.4* 10.7* 10.5* 12.2  ?HCT 35.4* 33.3* 34.1* 39.0  ?MCV 98.9 99.1 99.7 100.0  ?PLT 170 170 174 287  ? ? ?LFT ?No results for input(s): AST, ALT, ALKPHOS, BILITOT, PROT, ALBUMIN in the last 168 hours. ? ?  ?Antibiotics: ?Anti-infectives (From admission, onward)  ? ? Start     Dose/Rate Route Frequency Ordered Stop  ? 01/13/22 1045  cefTRIAXone (ROCEPHIN) 2 g in sodium chloride 0.9 % 100 mL IVPB       ? 2 g ?200 mL/hr over 30 Minutes Intravenous Every 24 hours 01/13/22 0953 01/15/22 1044  ? 01/10/22 1800  vancomycin (VANCOREADY) IVPB 1250 mg/250 mL  Status:  Discontinued       ? 1,250 mg ?166.7 mL/hr over 90 Minutes Intravenous Every 48 hours 01/08/22 2007 01/10/22 1154  ? 01/09/22 1000  cefTRIAXone (ROCEPHIN) 2 g in sodium chloride 0.9 % 100 mL IVPB  Status:  Discontinued       ? 2 g ?200 mL/hr over 30 Minutes Intravenous Daily 01/08/22 2003 01/13/22 0952  ? 01/09/22 0600  metroNIDAZOLE (FLAGYL) IVPB 500 mg  Status:  Discontinued       ? 500 mg ?100 mL/hr over 60 Minutes Intravenous Every 12 hours 01/08/22 1945 01/09/22 0952  ? 01/08/22 2200  cefTRIAXone (ROCEPHIN) 2 g in sodium chloride 0.9 % 100 mL IVPB  Status:  Discontinued       ? 2 g ?200 mL/hr over 30 Minutes Intravenous Daily 01/08/22 1959 01/08/22 2003  ? 01/08/22 2000  cefTRIAXone (ROCEPHIN) 1 g in sodium chloride 0.9 % 100 mL IVPB  Status:  Discontinued       ? 1 g ?200 mL/hr over 30 Minutes Intravenous Daily 01/08/22 1945 01/08/22 1959  ? 01/08/22 1700  ceFEPIme (MAXIPIME) 2 g in sodium chloride 0.9 %  100 mL IVPB  Status:  Discontinued       ? 2 g ?200 mL/hr over 30 Minutes Intravenous  Once 01/08/22 1648 01/08/22 1715  ? 01/08/22  1700  metroNIDAZOLE (FLAGYL) IVPB 500 mg       ? 500 mg ?100 mL/hr over 60 Minutes Intravenous  Once 01/08/22 1648 01/08/22 1825  ? 01/08/22 1700  vancomycin (VANCOCIN) IVPB 1000 mg/200 mL premix  Status:  Discontinued       ? 1,000 mg ?200 mL/hr over 60 Minutes Intravenous  Once 01/08/22 1648 01/08/22 1716  ? 01/08/22 1700  ceFEPIme (MAXIPIME) 2 g in sodium chloride 0.9 % 100 mL IVPB       ? 2 g ?200 mL/hr over 30 Minutes Intravenous  Once 01/08/22 1654 01/08/22 1751  ? 01/08/22 1700  vancomycin (VANCOREADY) IVPB 2000 mg/400 mL       ? 2,000 mg ?200 mL/hr over 120 Minutes Intravenous  Once 01/08/22 1654 01/08/22 2025  ? ?  ? ? ? ?DVT prophylaxis: Lovenox ? ?Code Status: Full code ? ?Family Communication: No family at bedside ? ? ?CONSULTS  ? ? ?Objective  ? ? ?Physical Examination: ? ? ?General-appears in no acute distress ?Heart-S1-S2, regular, no murmur auscultated ?Lungs-clear to auscultation bilaterally, no wheezing or crackles auscultated ?Abdomen-soft, nontender, no organomegaly ?Extremities-no edema in the lower extremities ?Neuro-alert, oriented x to self only ? ? ?Status is: Inpatient: Altered mental status ? ? ? ?Pressure Injury 01/08/22 Buttocks Medial Stage 2 -  Partial thickness loss of dermis presenting as a shallow open injury with a red, pink wound bed without slough. (Active)  ?01/08/22 2110  ?Location: Buttocks  ?Location Orientation: Medial  ?Staging: Stage 2 -  Partial thickness loss of dermis presenting as a shallow open injury with a red, pink wound bed without slough.  ?Wound Description (Comments):   ?Present on Admission: Yes  ? ? ? ? ? ? ?Oswald Hillock ?  ?[facility] ?If 7PM-7AM, please contact night-coverage at [email or web address], ?Office  [phone] ? ? ?01/16/2022, 3:39 PM  LOS: 8 days  ? ? ? ? ? ? ? ? ? ? ?  ?

## 2022-01-16 NOTE — Progress Notes (Signed)
?   01/15/22 2019  ?Assess: MEWS Score  ?Temp 98.5 ?F (36.9 ?C)  ?BP (!) 134/100 ?(Nurse notified)  ?Pulse Rate (!) 126 ?(Nurse notified)  ?Resp 19  ?Level of Consciousness Alert  ?SpO2 94 %  ?O2 Device Room Air  ?Assess: MEWS Score  ?MEWS Temp 0  ?MEWS Systolic 0  ?MEWS Pulse 2  ?MEWS RR 0  ?MEWS LOC 0  ?MEWS Score 2  ?MEWS Score Color Yellow  ?Assess: if the MEWS score is Yellow or Red  ?Were vital signs taken at a resting state? Yes  ?Focused Assessment No change from prior assessment  ?Does the patient meet 2 or more of the SIRS criteria? Yes  ?Does the patient have a confirmed or suspected source of infection? Yes  ?Provider and Rapid Response Notified? No  ?MEWS guidelines implemented *See Row Information* No, previously yellow, continue vital signs every 4 hours  ?Treat  ?MEWS Interventions Administered scheduled meds/treatments  ?Pain Scale 0-10  ?Pain Score 2  ?Take Vital Signs  ?Increase Vital Sign Frequency  Yellow: Q 2hr X 2 then Q 4hr X 2, if remains yellow, continue Q 4hrs  ?Escalate  ?MEWS: Escalate Yellow: discuss with charge nurse/RN and consider discussing with provider and RRT  ?Notify: Charge Nurse/RN  ?Name of Charge Nurse/RN Notified Raquel Sarna RN  ?Date Charge Nurse/RN Notified 01/15/22  ?Time Charge Nurse/RN Notified 2030  ?Document  ?Patient Outcome Other (Comment) ?(Stable with rest)  ?Progress note created (see row info) Yes  ?Assess: SIRS CRITERIA  ?SIRS Temperature  0  ?SIRS Pulse 1  ?SIRS Respirations  0  ?SIRS WBC 0  ?SIRS Score Sum  1  ? ? ?

## 2022-01-16 NOTE — Care Management Important Message (Signed)
Important Message ? ?Patient Details  ?Name: Julie Mora ?MRN: [id number] ?Date of Birth: July 13, 1941 ? ? ?Medicare Important Message Given:  Yes ? ? ? ? ?Memory Argue ?01/16/2022, 1:29 PM ?COPY OF IM MAILED TO PT'S ?

## 2022-01-17 DIAGNOSIS — N390 Urinary tract infection, site not specified: Secondary | ICD-10-CM | POA: Diagnosis not present

## 2022-01-17 DIAGNOSIS — A419 Sepsis, unspecified organism: Secondary | ICD-10-CM | POA: Diagnosis not present

## 2022-01-17 DIAGNOSIS — G9341 Metabolic encephalopathy: Secondary | ICD-10-CM | POA: Diagnosis not present

## 2022-01-17 DIAGNOSIS — N179 Acute kidney failure, unspecified: Secondary | ICD-10-CM | POA: Diagnosis not present

## 2022-01-17 MED ORDER — ALPRAZOLAM 0.25 MG PO TABS: 0.2500 mg | ORAL_TABLET | Freq: Two times a day (BID) | ORAL | 0 refills | Status: DC | PRN

## 2022-01-17 MED ORDER — MIRTAZAPINE 15 MG PO TABS: 7.5000 mg | ORAL_TABLET | Freq: Every day | ORAL | Status: AC

## 2022-01-17 MED ORDER — ACETAMINOPHEN 325 MG PO TABS: 650.0000 mg | ORAL_TABLET | Freq: Four times a day (QID) | ORAL | Status: AC | PRN

## 2022-01-17 NOTE — TOC Progression Note (Signed)
Transition of Care (TOC) - Progression Note  ? ? ?Patient Details  ?Name: Julie Mora ?MRN: [id number] ?Date of Birth: 10-28-1940 ? ?Transition of Care (TOC) CM/SW Contact  ?Ross Ludwig, LCSW ?Phone Number: ?01/17/2022, 11:01 AM ? ?Clinical Narrative:    ? ?Patient has been approved for SNF placement at [facility]  Auth id [id number], next review 01/18/2022.  CSW spoke to [place] at [facility], they can accept patient today after 12pm.  CSW updated attending physician and bedside nurse. ? ?Expected Discharge Plan: [place] ?Barriers to Discharge: Insurance Authorization ? ?Expected Discharge Plan and Services ?Expected Discharge Plan: [place] ?In-house Referral: Clinical Social Work ?  ?  ?Living arrangements for the past 2 months: [place] ?                ?DME Arranged: N/A ?DME Agency: NA ?  ?  ?  ?  ?  ?  ?  ?  ? ? ?Social Determinants of Health (SDOH) Interventions ?  ? ?Readmission Risk Interventions ?   ? View : No data to display.  ?  ?  ?  ? ? ?

## 2022-01-17 NOTE — TOC Transition Note (Signed)
Transition of Care (TOC) - CM/SW Discharge Note ? ? ?Patient Details  ?Name: Julie Mora ?MRN: [id number] ?Date of Birth: [birth year] ? ?Transition of Care (TOC) CM/SW Contact:  ?Ross Ludwig, LCSW ?Phone Number: ?01/17/2022, 1:05 PM ? ? ?Clinical Narrative:    ? ?Patient has been approved for SNF placement.  Patient will be going to [facility], CSW updated patient's sister [name].  Patient to be d/c'ed today to [facility] room 116.  Patient and family agreeable to plans will transport via ems RN to call report to [phone]. ? ? ? ? ?  ?Barriers to Discharge: Barriers Resolved ? ? ?Patient Goals and CMS Choice ?Patient states their goals for this hospitalization and ongoing recovery are:: To go to SNF for short term rehab, then return back home. ?CMS Medicare.gov Compare Post Acute Care list provided to:: Patient Represenative (must comment) ?Choice offered to / list presented to : Sibling ? ?Discharge Placement ?PASRR number recieved: 01/13/22 ?           ?Patient chooses bed at: [facility] ?Patient to be transferred to facility by: [facility] ?Name of family member notified: Sister ?Patient and family notified of of transfer: 01/17/22 ? ?Discharge Plan and Services ?In-house Referral: Clinical Social Work ?  ?           ?DME Arranged: N/A ?DME Agency: NA ?  ?  ?  ?  ?  ?  ?  ?  ? ?Social Determinants of Health (SDOH) Interventions ?  ? ? ?Readmission Risk Interventions ?   ? View : No data to display.  ?  ?  ?  ? ? ? ? ? ?

## 2022-01-17 NOTE — Discharge Summary (Addendum)
Amlodipine does not summary on 17-year Tylenol ?Physician Discharge Summary ?  ?Patient: Julie Mora MRN: [id number] DOB: 07-Sep-1941  ?Admit date:     01/08/2022  ?Discharge date: 01/17/22  ?Discharge Physician: Oswald Hillock  ? ?PCP: System, Provider Not In  ? ?Recommendations at discharge:  ? ?Patient to be discharged to skilled nursing facility ? ?Discharge Diagnoses: ?Principal Problem: ?  Sepsis ([place]) ?Active Problems: ?  Hypotension ?  Acute metabolic encephalopathy ?  AKI (acute kidney injury) ([place]) ?  Diabetes mellitus ([place]) ?  UTI (urinary tract infection) ?  Wound of lower extremity ?  Yeast infection of the skin ?  Pressure injury of skin ? ?Resolved Problems: ?  * No resolved hospital problems. * ? ?Hospital Course: ?81 y.o. female with medical history significant of CVA, HTN, Type 2 diabetes, HLD who presents from ALF at request of sister for AMS.  ?She requested patient be brought to the hospital today due to altered mental status and being confused.  She last saw her normal about 4 days ago.  However over the weekend patient has been having profuse diarrhea with known GI infection in the facility.  Patient is also been on Bactrim for the past 2 days for possible lower extremity wound and UTI.  She has been following wound care outpatient for bilateral lower leg with chronic lymphedema. ?  ?In the ED she was febrile with temperature 101.2 ?F, tachypnea, tachycardia, hypotension 80/50 which improved with IV fluids.  UA was positive for nitrate, moderate leukocytosis and more than 50 RBCs per high-power field, more than 50 WBC and many bacteria. ?Patient was admitted with sepsis, started on IV vancomycin, Rocephin and Flagyl, blood cultures were obtained. ?  ?On 4/24 morning patient was found to be hypotensive with MAP between 60-68, also had tachycardia.  PCCM was consulted patient was transferred to ICU and started on Levophed. ?  ?She was transitioned off Levophed and moved to medical floor. ?  ?TSH  resumed care on 01/11/2022 ? ?Assessment and Plan: ? ? ? ?Septic shock ?-Secondary to sepsis due to UTI, along with dehydration ?-Initially required Levophed, has been weaned off Levophed ?-Urine culture growing 30,000 colonies per mL of Klebsiella pneumoniae, 20,000 colonies per mL of Enterococcus faecalis ?-Enterococcus faecalis is resistant to vancomycin, however it is likely due to colonization  ?-Will not repeat further antibiotics unless patient develops fever or becomes symptomatic ?-Sepsis physiology has resolved ?-Treated with ceftriaxone for 7 days ?  ?Metabolic encephalopathy ?-Improved,  at baseline ? ?  ?Acute kidney injury ?-In setting of hypoperfusion due to hypotension ?-Improved with IV fluids ?-Creatinine has improved to 0.98 ?  ?  ?Hypokalemia ?-Replete ?  ?Hypophosphatemia ?-Replete ?  ?Diarrhea ?-Resolved ?-Likely viral gastroenteritis ?  ?Diabetes mellitus type 2 ?-Continue sliding scale insulin NovoLog ?  ?Chronic lymphedema/chronic venous stasis changes ?-Wound care  ?  ?Dementia ?-No behavior disturbance ?-Continue donepezil ? ?  ? ? ?Consultants:  ?Procedures performed:  ?Disposition: Skilled nursing facility ?Diet recommendation:  ?Discharge Diet Orders (From admission, onward)  ? ?  Start     Ordered  ? 01/17/22 0000  Diet - low sodium heart healthy       ? 01/17/22 1110  ? ?  ?  ? ?  ? ?Cardiac diet ?DISCHARGE MEDICATION: ?Allergies as of 01/17/2022   ? ?   Reactions  ? Azathioprine Shortness Of Breath, Other (See Comments)  ? severly allergic. nausea, diarrhea, low blood pressure, fever, chills, rash. was hospitalized  due to side effects in feb 2012. ?Other reaction(s): sob, nausea  ? Mycophenolate Mofetil Nausea Only  ? severe nausea ?Other reaction(s): Unknown  ? Ramipril Cough  ? Ramipril   ? Other reaction(s): cough  ? ?  ? ?  ?Medication List  ?  ? ?STOP taking these medications   ? ?Lantus SoloStar 100 UNIT/ML Solostar Pen ?Generic drug: insulin glargine ?   ?sulfamethoxazole-trimethoprim 800-160 MG tablet ?Commonly known as: BACTRIM DS ?  ? ?  ? ?TAKE these medications   ? ?Accu-Chek Aviva Plus test strip ?Generic drug: glucose blood ?USE TO CHECK BLOOD SUGAR BEFORE BREAKFAST AND SUPPER ?  ?acetaminophen 325 MG tablet ?Commonly known as: TYLENOL ?Take 2 tablets (650 mg total) by mouth every 6 (six) hours as needed for mild pain, fever or headache. ?What changed:  ?medication strength ?how much to take ?when to take this ?reasons to take this ?Another medication with the same name was removed. Continue taking this medication, and follow the directions you see here. ?  ?ALPRAZolam 0.25 MG tablet ?Commonly known as: Duanne Moron ?Take 1 tablet (0.25 mg total) by mouth 2 (two) times daily as needed (agitation). ?  ?aspirin 81 MG chewable tablet ?Chew 81 mg by mouth daily. ?  ?BD Pen Needle Nano U/F 32G X 4 MM Misc ?Generic drug: Insulin Pen Needle ?USE AS DIRECTED ?  ?Calmoseptine 0.44-20.6 % Oint ?Generic drug: Menthol-Zinc Oxide ?Apply 1 application. topically in the morning and at bedtime. Apply to buttock and per-area topically every day and night shift for redness for 90 days . (Began 03.23.23). ?  ?donepezil 10 MG tablet ?Commonly known as: ARICEPT ?Take 10 mg by mouth daily. ?  ?ferrous gluconate 324 MG tablet ?Commonly known as: FERGON ?TAKE 1 TABLET(324 MG) BY MOUTH DAILY WITH BREAKFAST ?What changed:  ?how much to take ?how to take this ?when to take this ?  ?gabapentin 100 MG capsule ?Commonly known as: NEURONTIN ?Take 100 mg by mouth at bedtime. ?  ?magnesium gluconate 500 MG tablet ?Commonly known as: MAGONATE ?Take 500 mg by mouth daily. ?  ?memantine 5 MG tablet ?Commonly known as: NAMENDA ?Take 5 mg by mouth 2 (two) times daily. ?  ?METAMUCIL FIBER PO ?Take 0.4 g by mouth at bedtime. ?  ?mirtazapine 15 MG tablet ?Commonly known as: REMERON ?Take 0.5 tablets (7.5 mg total) by mouth at bedtime. ?What changed: how much to take ?  ?NovoLOG FlexPen 100 UNIT/ML  FlexPen ?Generic drug: insulin aspart ?Inject 0-15 Units into the skin 3 (three) times daily with meals. 70-120=0 units; 121-150=2 units; 151-200= 3 units; 201-250=5 units; 251-300=8 units; 301-350=11 units; 351-400= 15 units. ? ?401 or greater, call 911. ?  ?polyethylene glycol 17 g packet ?Commonly known as: MIRALAX / GLYCOLAX ?Take 17 g by mouth every other day. **Hold for loose stool. ?What changed: Another medication with the same name was removed. Continue taking this medication, and follow the directions you see here. ?  ?prednisoLONE acetate 1 % ophthalmic suspension ?Commonly known as: PRED FORTE ?Place 1 drop into both eyes in the morning and at bedtime. ?  ?senna-docusate 8.6-50 MG tablet ?Commonly known as: Senokot-S ?Take 1 tablet by mouth at bedtime. **Hold for loose stools. ?  ?simvastatin 20 MG tablet ?Commonly known as: ZOCOR ?Take 1 tablet (20 mg total) by mouth at bedtime. ?  ?Systane 0.4-0.3 % Soln ?Generic drug: Polyethyl Glycol-Propyl Glycol ?Apply 1 drop to eye in the morning, at noon, in the evening, and at bedtime. Dry  eyes. ?  ? ?  ? ?  ?  ? ? ?  ?Discharge Care Instructions  ?(From admission, onward)  ?  ? ? ?  ? ?  Start     Ordered  ? 01/17/22 0000  Discharge wound care:       ?Comments: Wound care to bilateral LEs:  Wash with soap and water daily, rinse and pat gently dry. Cover open areas with folded layers of xeroform gauze Kellie Simmering # 294). Top with dry gauze, ABD pads, and wrap from just below toes to just below knees with Kerlix roll gauze.  Top Kerlix with ACE bandage wrapped in a similar manner. Place feet into Prevalon boots.  ? 01/17/22 1110  ? ?  ?  ? ?  ? ? ?Discharge Exam: ?Filed Weights  ? 01/08/22 1631 01/08/22 2109 01/10/22 1525  ?Weight: 113 kg 102.6 kg 106.6 kg  ? ?General-appears in no acute distress ?Heart-S1-S2, regular, no murmur auscultated ?Lungs-clear to auscultation bilaterally, no wheezing or crackles auscultated ?Abdomen-soft, nontender, no  organomegaly ?Extremities-no edema in the lower extremities ?Neuro-alert, oriented to self only, no focal deficit noted ? ?Condition at discharge: good ? ?The results of significant diagnostics from this hospitalization (including imaging, microbiology, anc

## 2022-01-19 LAB — GLUCOSE, CAPILLARY
Glucose-Capillary: 113 mg/dL — ABNORMAL HIGH (ref 70–99)
Glucose-Capillary: 116 mg/dL — ABNORMAL HIGH (ref 70–99)
Glucose-Capillary: 159 mg/dL — ABNORMAL HIGH (ref 70–99)
Glucose-Capillary: 133 mg/dL — ABNORMAL HIGH (ref 70–99)
Glucose-Capillary: 249 mg/dL — ABNORMAL HIGH (ref 70–99)
Glucose-Capillary: 57 mg/dL — ABNORMAL LOW (ref 70–99)
Glucose-Capillary: 107 mg/dL — ABNORMAL HIGH (ref 70–99)

## 2022-02-21 ENCOUNTER — Observation Stay (HOSPITAL_COMMUNITY)
Admission: EM | Admit: 2022-02-21 | Discharge: 2022-02-23 | Disposition: A | Discharge status: Skilled Nursing Facility | Payer: Medicare PPO | Attending: Internal Medicine | Admitting: Internal Medicine

## 2022-02-21 ENCOUNTER — Emergency Department (HOSPITAL_COMMUNITY): Payer: Medicare PPO

## 2022-02-21 ENCOUNTER — Other Ambulatory Visit: Payer: Self-pay

## 2022-02-21 ENCOUNTER — Encounter (HOSPITAL_COMMUNITY): Payer: Self-pay

## 2022-02-21 DIAGNOSIS — R600 Localized edema: Secondary | ICD-10-CM | POA: Diagnosis not present

## 2022-02-21 DIAGNOSIS — Z7982 Long term (current) use of aspirin: Secondary | ICD-10-CM | POA: Insufficient documentation

## 2022-02-21 DIAGNOSIS — Z8719 Personal history of other diseases of the digestive system: Secondary | ICD-10-CM | POA: Insufficient documentation

## 2022-02-21 DIAGNOSIS — L304 Erythema intertrigo: Secondary | ICD-10-CM | POA: Insufficient documentation

## 2022-02-21 DIAGNOSIS — R4182 Altered mental status, unspecified: Secondary | ICD-10-CM | POA: Insufficient documentation

## 2022-02-21 DIAGNOSIS — I872 Venous insufficiency (chronic) (peripheral): Secondary | ICD-10-CM | POA: Insufficient documentation

## 2022-02-21 DIAGNOSIS — I444 Left anterior fascicular block: Secondary | ICD-10-CM | POA: Diagnosis not present

## 2022-02-21 DIAGNOSIS — Z20822 Contact with and (suspected) exposure to covid-19: Secondary | ICD-10-CM | POA: Insufficient documentation

## 2022-02-21 DIAGNOSIS — Z8673 Personal history of transient ischemic attack (TIA), and cerebral infarction without residual deficits: Secondary | ICD-10-CM | POA: Insufficient documentation

## 2022-02-21 DIAGNOSIS — Z794 Long term (current) use of insulin: Secondary | ICD-10-CM | POA: Insufficient documentation

## 2022-02-21 DIAGNOSIS — F0390 Unspecified dementia without behavioral disturbance: Secondary | ICD-10-CM | POA: Insufficient documentation

## 2022-02-21 DIAGNOSIS — R1310 Dysphagia, unspecified: Secondary | ICD-10-CM | POA: Insufficient documentation

## 2022-02-21 DIAGNOSIS — I10 Essential (primary) hypertension: Secondary | ICD-10-CM | POA: Diagnosis not present

## 2022-02-21 DIAGNOSIS — R000 Tachycardia, unspecified: Secondary | ICD-10-CM | POA: Diagnosis not present

## 2022-02-21 DIAGNOSIS — R1084 Generalized abdominal pain: Secondary | ICD-10-CM | POA: Insufficient documentation

## 2022-02-21 DIAGNOSIS — Z872 Personal history of diseases of the skin and subcutaneous tissue: Secondary | ICD-10-CM | POA: Insufficient documentation

## 2022-02-21 DIAGNOSIS — I959 Hypotension, unspecified: Secondary | ICD-10-CM | POA: Diagnosis not present

## 2022-02-21 DIAGNOSIS — E785 Hyperlipidemia, unspecified: Secondary | ICD-10-CM | POA: Diagnosis not present

## 2022-02-21 DIAGNOSIS — E8809 Other disorders of plasma-protein metabolism, not elsewhere classified: Secondary | ICD-10-CM | POA: Insufficient documentation

## 2022-02-21 DIAGNOSIS — K5289 Other specified noninfective gastroenteritis and colitis: Secondary | ICD-10-CM

## 2022-02-21 DIAGNOSIS — I890 Lymphedema, not elsewhere classified: Secondary | ICD-10-CM | POA: Insufficient documentation

## 2022-02-21 DIAGNOSIS — G9341 Metabolic encephalopathy: Secondary | ICD-10-CM | POA: Insufficient documentation

## 2022-02-21 DIAGNOSIS — R6510 Systemic inflammatory response syndrome (SIRS) of non-infectious origin without acute organ dysfunction: Secondary | ICD-10-CM

## 2022-02-21 DIAGNOSIS — R109 Unspecified abdominal pain: Secondary | ICD-10-CM | POA: Insufficient documentation

## 2022-02-21 DIAGNOSIS — Z66 Do not resuscitate: Secondary | ICD-10-CM | POA: Insufficient documentation

## 2022-02-21 DIAGNOSIS — Z87440 Personal history of urinary (tract) infections: Secondary | ICD-10-CM | POA: Insufficient documentation

## 2022-02-21 DIAGNOSIS — R8281 Pyuria: Secondary | ICD-10-CM | POA: Insufficient documentation

## 2022-02-21 DIAGNOSIS — J90 Pleural effusion, not elsewhere classified: Secondary | ICD-10-CM | POA: Insufficient documentation

## 2022-02-21 DIAGNOSIS — K5900 Constipation, unspecified: Secondary | ICD-10-CM | POA: Insufficient documentation

## 2022-02-21 DIAGNOSIS — D539 Nutritional anemia, unspecified: Secondary | ICD-10-CM | POA: Insufficient documentation

## 2022-02-21 DIAGNOSIS — G9340 Encephalopathy, unspecified: Secondary | ICD-10-CM | POA: Diagnosis not present

## 2022-02-21 DIAGNOSIS — Z79899 Other long term (current) drug therapy: Secondary | ICD-10-CM | POA: Diagnosis not present

## 2022-02-21 DIAGNOSIS — M7989 Other specified soft tissue disorders: Secondary | ICD-10-CM | POA: Insufficient documentation

## 2022-02-21 DIAGNOSIS — K429 Umbilical hernia without obstruction or gangrene: Secondary | ICD-10-CM | POA: Insufficient documentation

## 2022-02-21 DIAGNOSIS — K529 Noninfective gastroenteritis and colitis, unspecified: Secondary | ICD-10-CM

## 2022-02-21 DIAGNOSIS — E119 Type 2 diabetes mellitus without complications: Secondary | ICD-10-CM | POA: Insufficient documentation

## 2022-02-21 DIAGNOSIS — R197 Diarrhea, unspecified: Secondary | ICD-10-CM | POA: Diagnosis not present

## 2022-02-21 DIAGNOSIS — E778 Other disorders of glycoprotein metabolism: Secondary | ICD-10-CM | POA: Insufficient documentation

## 2022-02-21 DIAGNOSIS — F03C0 Unspecified dementia, severe, without behavioral disturbance, psychotic disturbance, mood disturbance, and anxiety: Secondary | ICD-10-CM | POA: Insufficient documentation

## 2022-02-21 DIAGNOSIS — I6782 Cerebral ischemia: Secondary | ICD-10-CM | POA: Insufficient documentation

## 2022-02-21 DIAGNOSIS — R159 Full incontinence of feces: Secondary | ICD-10-CM | POA: Insufficient documentation

## 2022-02-21 DIAGNOSIS — L102 Pemphigus foliaceous: Secondary | ICD-10-CM | POA: Insufficient documentation

## 2022-02-21 DIAGNOSIS — N2889 Other specified disorders of kidney and ureter: Secondary | ICD-10-CM | POA: Insufficient documentation

## 2022-02-21 DIAGNOSIS — R5383 Other fatigue: Secondary | ICD-10-CM | POA: Insufficient documentation

## 2022-02-21 DIAGNOSIS — I700 Atherosclerosis of aorta: Secondary | ICD-10-CM | POA: Insufficient documentation

## 2022-02-21 LAB — COMPREHENSIVE METABOLIC PANEL
ALT: 11 U/L (ref 0–44)
AST: 19 U/L (ref 15–41)
Albumin: 1.7 g/dL — ABNORMAL LOW (ref 3.5–5.0)
Alkaline Phosphatase: 66 U/L (ref 38–126)
Anion gap: 8 (ref 5–15)
BUN: 24 mg/dL — ABNORMAL HIGH (ref 8–23)
CO2: 27 mmol/L (ref 22–32)
Calcium: 8.4 mg/dL — ABNORMAL LOW (ref 8.9–10.3)
Chloride: 107 mmol/L (ref 98–111)
Creatinine, Ser: 1.1 mg/dL — ABNORMAL HIGH (ref 0.44–1.00)
GFR, Estimated: 51 mL/min — ABNORMAL LOW (ref >60)
Glucose, Bld: 93 mg/dL (ref 70–99)
Potassium: 4.3 mmol/L (ref 3.5–5.1)
Sodium: 142 mmol/L (ref 135–145)
Total Bilirubin: 0.5 mg/dL (ref 0.3–1.2)
Total Protein: 5.5 g/dL — ABNORMAL LOW (ref 6.5–8.1)

## 2022-02-21 LAB — TSH: TSH: 2.175 u[IU]/mL (ref 0.350–4.500)

## 2022-02-21 LAB — CBC WITH DIFFERENTIAL/PLATELET
Abs Immature Granulocytes: 0.05 10*3/uL (ref 0.00–0.07)
Basophils Absolute: 0 10*3/uL (ref 0.0–0.1)
Basophils Relative: 0 %
Eosinophils Absolute: 0.1 10*3/uL (ref 0.0–0.5)
Eosinophils Relative: 1 %
HCT: 35.5 % — ABNORMAL LOW (ref 36.0–46.0)
Hemoglobin: 11.2 g/dL — ABNORMAL LOW (ref 12.0–15.0)
Immature Granulocytes: 1 %
Lymphocytes Relative: 23 %
Lymphs Abs: 2 10*3/uL (ref 0.7–4.0)
MCH: 32 pg (ref 26.0–34.0)
MCHC: 31.5 g/dL (ref 30.0–36.0)
MCV: 101.4 fL — ABNORMAL HIGH (ref 80.0–100.0)
Monocytes Absolute: 0.6 10*3/uL (ref 0.1–1.0)
Monocytes Relative: 6 %
Neutro Abs: 6.3 10*3/uL (ref 1.7–7.7)
Neutrophils Relative %: 69 %
Platelets: 246 10*3/uL (ref 150–400)
RBC: 3.5 MIL/uL — ABNORMAL LOW (ref 3.87–5.11)
RDW: 14 % (ref 11.5–15.5)
WBC: 9.1 10*3/uL (ref 4.0–10.5)
nRBC: 0 % (ref 0.0–0.2)

## 2022-02-21 LAB — URINALYSIS, ROUTINE W REFLEX MICROSCOPIC
Bacteria, UA: NONE SEEN
Bilirubin Urine: NEGATIVE
Glucose, UA: NEGATIVE mg/dL
Hgb urine dipstick: NEGATIVE
Ketones, ur: NEGATIVE mg/dL
Nitrite: NEGATIVE
Protein, ur: NEGATIVE mg/dL
Specific Gravity, Urine: 1.01 (ref 1.005–1.030)
pH: 6 (ref 5.0–8.0)

## 2022-02-21 LAB — LACTIC ACID, PLASMA
Lactic Acid, Venous: 2.1 mmol/L (ref 0.5–1.9)
Lactic Acid, Venous: 1.8 mmol/L (ref 0.5–1.9)

## 2022-02-21 LAB — CBG MONITORING, ED: Glucose-Capillary: 118 mg/dL — ABNORMAL HIGH (ref 70–99)

## 2022-02-21 LAB — PROTIME-INR
INR: 1.1 (ref 0.8–1.2)
Prothrombin Time: 13.7 seconds (ref 11.4–15.2)

## 2022-02-21 LAB — VITAMIN B12: Vitamin B-12: 237 pg/mL (ref 180–914)

## 2022-02-21 LAB — RESP PANEL BY RT-PCR (FLU A&B, COVID) ARPGX2
Influenza A by PCR: NEGATIVE
Influenza B by PCR: NEGATIVE
SARS Coronavirus 2 by RT PCR: NEGATIVE

## 2022-02-21 LAB — APTT: aPTT: 26 seconds (ref 24–36)

## 2022-02-21 MED ORDER — CEFEPIME HCL 2 G IV SOLR
2.0000 g | Freq: Once | INTRAVENOUS | Status: AC
  Administered 2022-02-21: 2 g via INTRAVENOUS
  Filled 2022-02-21: qty 12.5

## 2022-02-21 MED ORDER — INSULIN ASPART 100 UNIT/ML IJ SOLN: 0.0000 [IU] | Freq: Three times a day (TID) | INTRAMUSCULAR | Status: DC

## 2022-02-21 MED ORDER — LACTATED RINGERS IV BOLUS (SEPSIS)
1000.0000 mL | Freq: Once | INTRAVENOUS | Status: AC
  Administered 2022-02-21: 1000 mL via INTRAVENOUS

## 2022-02-21 MED ORDER — NYSTATIN 100000 UNIT/GM EX POWD
Freq: Two times a day (BID) | CUTANEOUS | Status: DC
  Filled 2022-02-21 (×4): qty 15

## 2022-02-21 MED ORDER — VANCOMYCIN HCL IN DEXTROSE 1-5 GM/200ML-% IV SOLN
1000.0000 mg | INTRAVENOUS | Status: DC
  Administered 2022-02-22: 1000 mg via INTRAVENOUS
  Filled 2022-02-21 (×2): qty 200

## 2022-02-21 MED ORDER — ACETAMINOPHEN 650 MG RE SUPP: 650.0000 mg | Freq: Four times a day (QID) | RECTAL | Status: DC | PRN

## 2022-02-21 MED ORDER — LACTATED RINGERS IV BOLUS (SEPSIS)
1000.0000 mL | Freq: Once | INTRAVENOUS | Status: AC
Start: 2022-02-21 — End: 2022-02-21
  Administered 2022-02-21: 1000 mL via INTRAVENOUS

## 2022-02-21 MED ORDER — VANCOMYCIN HCL IN DEXTROSE 1-5 GM/200ML-% IV SOLN
1000.0000 mg | Freq: Once | INTRAVENOUS | Status: DC
  Filled 2022-02-21: qty 200

## 2022-02-21 MED ORDER — INSULIN ASPART 100 UNIT/ML IJ SOLN: 0.0000 [IU] | Freq: Every day | INTRAMUSCULAR | Status: DC

## 2022-02-21 MED ORDER — ASPIRIN 81 MG PO CHEW
81.0000 mg | CHEWABLE_TABLET | Freq: Every day | ORAL | Status: DC
  Administered 2022-02-21 – 2022-02-22 (×2): 81 mg via ORAL
  Filled 2022-02-21 (×3): qty 1

## 2022-02-21 MED ORDER — SIMVASTATIN 20 MG PO TABS
20.0000 mg | ORAL_TABLET | Freq: Every day | ORAL | Status: DC
  Administered 2022-02-21 – 2022-02-22 (×2): 20 mg via ORAL
  Filled 2022-02-21 (×2): qty 1

## 2022-02-21 MED ORDER — LACTATED RINGERS IV BOLUS (SEPSIS)
500.0000 mL | Freq: Once | INTRAVENOUS | Status: AC
  Administered 2022-02-21: 500 mL via INTRAVENOUS

## 2022-02-21 MED ORDER — SODIUM CHLORIDE 0.9 % IV SOLN
2.0000 g | Freq: Two times a day (BID) | INTRAVENOUS | Status: DC
  Administered 2022-02-21 – 2022-02-22 (×2): 2 g via INTRAVENOUS
  Filled 2022-02-21 (×2): qty 12.5

## 2022-02-21 MED ORDER — ENOXAPARIN SODIUM 60 MG/0.6ML IJ SOSY
50.0000 mg | PREFILLED_SYRINGE | INTRAMUSCULAR | Status: DC
  Administered 2022-02-21 – 2022-02-23 (×3): 50 mg via SUBCUTANEOUS
  Filled 2022-02-21: qty 0.5
  Filled 2022-02-21 (×2): qty 0.6

## 2022-02-21 MED ORDER — VANCOMYCIN HCL 1500 MG/300ML IV SOLN
1500.0000 mg | Freq: Once | INTRAVENOUS | Status: AC
  Administered 2022-02-21: 1500 mg via INTRAVENOUS
  Filled 2022-02-21: qty 300

## 2022-02-21 MED ORDER — MEMANTINE HCL 10 MG PO TABS: 5.0000 mg | ORAL_TABLET | Freq: Two times a day (BID) | ORAL | Status: DC

## 2022-02-21 MED ORDER — DONEPEZIL HCL 10 MG PO TABS: 10.0000 mg | ORAL_TABLET | Freq: Every day | ORAL | Status: DC

## 2022-02-21 MED ORDER — METRONIDAZOLE 500 MG/100ML IV SOLN
500.0000 mg | Freq: Once | INTRAVENOUS | Status: AC
Start: 2022-02-21 — End: 2022-02-21
  Administered 2022-02-21: 500 mg via INTRAVENOUS
  Filled 2022-02-21: qty 100

## 2022-02-21 MED ORDER — IOHEXOL 300 MG/ML  SOLN
100.0000 mL | Freq: Once | INTRAMUSCULAR | Status: AC | PRN
  Administered 2022-02-21: 100 mL via INTRAVENOUS

## 2022-02-21 MED ORDER — ACETAMINOPHEN 325 MG PO TABS
650.0000 mg | ORAL_TABLET | Freq: Four times a day (QID) | ORAL | Status: DC | PRN
  Administered 2022-02-21: 650 mg via ORAL
  Filled 2022-02-21: qty 2

## 2022-02-21 MED ORDER — LACTATED RINGERS IV SOLN: INTRAVENOUS | Status: DC

## 2022-02-21 NOTE — H&P (Cosign Needed Addendum)
Date: 02/21/2022               Patient Name:  Julie Mora MRN: [id number]  DOB: 1940-10-22 Age / Sex: 81 y.o., female   PCP: System, Provider Not In              Medical Service: Internal Medicine Teaching Service              Attending Physician: Dr. Velna Ochs, MD    First Contact: Hartley Barefoot, MS4 Pager: [phone]  Second Contact: Dr. Gaylan Gerold Pager: [phone]            After Hours (After 5p/  First Contact Pager: [phone]  weekends / holidays): Second Contact Pager: [phone]   Chief Complaint: Diarrhea  History of Present Illness: Julie Mora is a 81 y.o. female with a PMHx of dementia, fecal incontinence, pemphigus foliaceous, T2DM, HTN who presented to the ED from [facility] after the staff there were concerned for sepsis.  Per [facility] unit nurse, Julie Mora (collateral obtained by phone, [phone]):  Nurse on shift early this morning at [facility] was concerned because for several minutes [name] would not respond to verbal stimuli or sternal rub. Her eyes seemed to be glazed over. Vitals at that time indicated hypotension and hypoxia. To [facility] knowledge, there are not any GI bugs being transmitted around the facility.  Per her sister, Julie Mora (collateral obtained by phone, [phone]):   At baseline, Julie Mora can be coherent at times but can quickly be confused and reliving things. It seems that she also hallucinates at baseline - people and animals that aren't there. Julie Mora last saw Julie Mora on Saturday, and she was in her usual state of health. Julie Mora, a friend visited Julie Mora and told Julie Mora that she was surprised at how coherent she was.   This morning, [facility] nursing home called this morning and was concerned that her blood pressure had dropped and that her O2 sats had dropped. When Julie Mora saw Julie Mora today in the ED, she was talking non stop about things that don't make a lot of sense, and seems more confused than usual. This episode seems similar to a  past episode of a UTI that caused Julie Mora to have sepsis.    On interview, Julie Mora's main concern is her diarrhea. She reports that she has been having increased amounts of diarrhea recently. No nausea or vomiting. No abdominal pain.  She also has had increased swelling and pain in her leg.  She has trouble seeing as well as balance problems, but these have been issues for some time.  No chest pain, shortness of breath, cough, headache, dysuria.  Meds:  Current Meds  Medication Sig   acetaminophen (TYLENOL) 325 MG tablet Take 2 tablets (650 mg total) by mouth every 6 (six) hours as needed for mild pain, fever or headache.   aspirin 81 MG chewable tablet Chew 81 mg by mouth daily.   donepezil (ARICEPT) 10 MG tablet Take 10 mg by mouth daily.   ferrous gluconate (FERGON) 324 MG tablet TAKE 1 TABLET(324 MG) BY MOUTH DAILY WITH BREAKFAST (Patient taking differently: Take 324 mg by mouth daily.)   gabapentin (NEURONTIN) 100 MG capsule Take 100 mg by mouth at bedtime.   Infant Care Products [facility]) OINT Apply 1 application. topically in the morning and at bedtime. Buttocks and inner thighs   magnesium gluconate (MAGONATE) 500 MG tablet Take 500 mg by mouth daily.   memantine (NAMENDA) 5 MG tablet Take 5 mg by  mouth 2 (two) times daily.   mirtazapine (REMERON) 15 MG tablet Take 0.5 tablets (7.5 mg total) by mouth at bedtime.   neomycin-polymyxin-dexameth (MAXITROL) 0.1 % OINT Place 1 application. into both eyes 4 (four) times daily.   NOVOLOG FLEXPEN 100 UNIT/ML FlexPen Inject 0-15 Units into the skin 3 (three) times daily with meals. Per Sliding Scale: 70-120 0units 121-150 2units 151-200 3units 201-250 5units 251-300 8units 301-350 11units 351-400 15units 401 or greater, call 911.   Polyethyl Glycol-Propyl Glycol (SYSTANE) 0.4-0.3 % SOLN Apply 1 drop to eye in the morning, at noon, in the evening, and at bedtime. Dry eyes.   polyethylene glycol (MIRALAX / GLYCOLAX) 17 g packet Take 17  g by mouth daily. Hold for loose stool.   senna-docusate (SENOKOT-S) 8.6-50 MG tablet Take 1 tablet by mouth 2 (two) times daily. Hold for loose stools.   simvastatin (ZOCOR) 20 MG tablet Take 1 tablet (20 mg total) by mouth at bedtime.   Allergies: Allergies as of 02/21/2022 - Review Complete 02/21/2022  Allergen Reaction Noted   Azathioprine Shortness Of Breath and Other (See Comments) 03/30/2011   Mycophenolate mofetil Nausea Only 05/08/2013   Ramipril Cough 03/30/2011   Past Medical History:  Diagnosis Date   Anemia    Essential hypertension, benign    Incontinence    Other and unspecified hyperlipidemia    Pemphigus foliaceous    Renal insufficiency    Type II or unspecified type diabetes mellitus without mention of complication, not stated as uncontrolled    Vitamin D deficiency    Per her sister, Julie Mora (collateral obtained by phone, [phone]):   Julie Mora  has had eye problems since 2012. She has an autoimmune disease called pemphigus violaceous. She took heavy doses of prednisone to control it in the past. She has been taking prednisone drops and serum drops, but it has been difficult to get these treatments, so she started an eye ointment recently.   Julie Mora's legs have been cared for differently recently. She used to have them wrapped up at the nursing home to help reduce the swelling. Recently, they have been doing different treatments for her leg swelling.  Julie Mora has skin problems under her breasts that are chronic.   At [facility], her prior nursing home, she dealt with constipation.    Family History: reviewed, no pertinent history noted  Social History:  She lives at [facility]. She does not consume alcohol or use tobacco products Her husband passed 6 years ago.   ROS: See HPI  Physical Exam: Blood pressure 119/80, pulse (!) 108, temperature (!) 95 F (35 C), temperature source Rectal, resp. rate (!) 25, height '5\' 5"'$  (1.651 m), weight 108.9  kg, SpO2 100 %.  GEN: in no acute distress HEENT: lacking teeth, yellowish material coming out of tear ducts CV: regular rhythm, no murmurs, rubs, or gallops PULM: apices clear to auscultation ABD: bowel sounds present, obese, soft-nontender LOWER EXT: bilateral erythema around the calves, 3+ pitting edema, scales and crusting.  DERM: erythema and whitish material underneath both breasts:  Right breast:   Left breast:   NEURO: grip strength symmetric, plantarflexion strength symmetric PSY: alert without fluctuation in level of consciousness, unoriented, tangential in conversation, Mini-Cog 1 point, affect irritable  EKG: personally reviewed my interpretation is regular rate and rhythm, normal PR interval, normal QRS duration, QTc normal, left QRS axis deviation that is unchanged compared to 01/11/22 ECG.  CXR: personally reviewed my interpretation is compared CXR from 01/08/22  the lung volumes are smaller, there is rightward deviation of one segment of the trachea that is unchanged from prior, there is increased prominence of the pulmonary markings, there is blurring of the right heart border.  Labs:    Latest Ref Rng & Units 02/21/2022    6:39 AM 01/14/2022    6:18 PM 01/12/2022    4:44 AM  CBC  WBC 4.0 - 10.5 K/uL 9.1   9.1   7.1    Hemoglobin 12.0 - 15.0 g/dL 11.2   12.2   10.5    Hematocrit 36.0 - 46.0 % 35.5   39.0   34.1    Platelets 150 - 400 K/uL 246   287   174        Latest Ref Rng & Units 02/21/2022    6:39 AM 01/14/2022    4:00 PM 01/13/2022    4:58 AM  BMP  Glucose 70 - 99 mg/dL 93   113   97    BUN 8 - 23 mg/dL '24   8   26    '$ Creatinine 0.44 - 1.00 mg/dL 1.10   0.72   0.98    Sodium 135 - 145 mmol/L 142   136   142    Potassium 3.5 - 5.1 mmol/L 4.3   4.3   4.0    Chloride 98 - 111 mmol/L 107   111   114    CO2 22 - 32 mmol/L '27   20   22    '$ Calcium 8.9 - 10.3 mg/dL 8.4   7.9   8.0     .  Latest Reference Range & Units 02/21/22 06:39 02/21/22 09:12  Lactic Acid,  Venous 0.5 - 1.9 mmol/L 2.1 (HH) 1.8  (HH): Data is critically high  CT Head Wo Contrast  Result Date: 02/21/2022 CLINICAL DATA:  Delirium. EXAM: CT HEAD WITHOUT CONTRAST TECHNIQUE: Contiguous axial images were obtained from the base of the skull through the vertex without intravenous contrast. RADIATION DOSE REDUCTION: This exam was performed according to the departmental dose-optimization program which includes automated exposure control, adjustment of the mA and/or kV according to patient size and/or use of iterative reconstruction technique. COMPARISON:  Head CT 01/08/2022 FINDINGS: Brain: There is no evidence of an acute infarct, intracranial hemorrhage, mass, midline shift, or extra-axial fluid collection. Chronic infarcts are again noted in the right frontal lobe, left parieto-occipital region, and cerebellum. Hypodensities elsewhere in the cerebral white matter bilaterally are unchanged and nonspecific but compatible with mild chronic small vessel ischemic disease. There is mild cerebral atrophy. Vascular: Calcified atherosclerosis at the skull base. No hyperdense vessel. Skull: No fracture or suspicious osseous lesion. Sinuses/Orbits: Chronic left sphenoid sinusitis with near complete opacification of the sinus. Clear mastoid air cells. Bilateral cataract extraction. Other: None. IMPRESSION: 1. No evidence of acute intracranial abnormality. 2. Chronic ischemia with multiple old infarcts. Electronically Signed   By: Logan Bores M.D.   On: 02/21/2022 11:14   CT ABDOMEN PELVIS W CONTRAST  Result Date: 02/21/2022 CLINICAL DATA:  Diffuse abdominal pain. EXAM: CT ABDOMEN AND PELVIS WITH CONTRAST TECHNIQUE: Multidetector CT imaging of the abdomen and pelvis was performed using the standard protocol following bolus administration of intravenous contrast. RADIATION DOSE REDUCTION: This exam was performed according to the departmental dose-optimization program which includes automated exposure control,  adjustment of the mA and/or kV according to patient size and/or use of iterative reconstruction technique. CONTRAST:  141m OMNIPAQUE IOHEXOL 300 MG/ML  SOLN COMPARISON:  MRI abdomen from 07/01/2018 and CT AP from 06/07/2017. FINDINGS: Lower chest: Trace left pleural effusion and small right pleural effusion noted. Hepatobiliary: No focal liver abnormality. Cholecystectomy. No bile duct dilatation. Pancreas: Unremarkable. No pancreatic ductal dilatation or surrounding inflammatory changes. Spleen: Normal in size without focal abnormality. Adrenals/Urinary Tract: Previously characterized Bosniak class 2 F lesion within the inferior pole of the left kidney measures 4.4 by 2.8 cm, image 42/3. This measures 18 Hounsfield units on today's study. On the previous exam this mass was measured 5.1 x 3.7 cm right-sided pelvocaliectasis is identified. No obstructing stone identified along the course of the right ureter. 2.2 cm diverticula arises off the anterior wall of the urinary bladder. Bladder is otherwise unremarkable. Stomach/Bowel: Stomach is normal. The appendix is not confidently identified. No signs of pericecal inflammation. Moderate retained stool identified within the rectum. There is mild wall thickening involving the rectum which may reflect underlying stercoral colitis. The remaining bowel loops are unremarkable without signs of wall thickening, inflammation or distension. Vascular/Lymphatic: Aortic atherosclerosis without aneurysm. No signs of abdominopelvic adenopathy. Reproductive: Uterus and bilateral adnexa are unremarkable. Other: No free fluid or fluid collections. Fat containing umbilical hernia is identified measuring 5.6 x 2.6 by 7.9 cm. Musculoskeletal: No acute or significant osseous findings. Multilevel degenerative disc disease identified within the lower thoracic and lumbar spine. No acute or suspicious osseous findings. IMPRESSION: 1. Moderate retained stool identified within the rectum with  mild wall thickening which may reflect underlying stercoral colitis. 2. Mild right-sided pelvocaliectasis. No obstructing stone identified. 3. Previously characterized Bosniak class 36F lesion within the inferior pole of left kidney is mildly decreased in size from previous exam. 4. Fat containing umbilical hernia. 5. Trace left pleural effusion and small right pleural effusion. 6. Aortic Atherosclerosis (ICD10-I70.0). Electronically Signed   By: Kerby Moors M.D.   On: 02/21/2022 11:28   DG Chest Port 1 View  Result Date: 02/21/2022 CLINICAL DATA:  Sepsis. EXAM: PORTABLE CHEST 1 VIEW COMPARISON:  01/08/2022 FINDINGS: Stable cardiomediastinal contours. Lung volumes are low and there is rotational artifact. Chronic interstitial coarsening is noted bilaterally. No superimposed pleural effusion, interstitial edema or airspace disease. IMPRESSION: 1. Low lung volumes. 2. Chronic interstitial coarsening. Electronically Signed   By: Kerby Moors M.D.   On: 02/21/2022 06:30     Assessment & Plan by Problem: Principal Problem:   Acute encephalopathy  SIRS, source of infection unknown: HPI consisting of increased confusion and an episode of unresponsiveness. On interview, reporting diarrhea, and had two loose stools during interview. Tachycardic, hypotensive, and hypothermic on presentation. Elevated lactate. Suspect GI infection is source of infection given copious diarrhea, CT showing stercoral colitis making encopresis also possible. Another possible  source of infection is lower extremity cellulitis though calves they appear similarly edematous and erythematous bilaterally. Pneumonia unlikely without symptoms and CXR without infiltrate. UTI positive for leukocytes, negative for nitrites and bacteria, no lower abdominal pain on palpation.  Blood cultures X2 S/p 3.5L LR (approx. 35 cc/kg), further boluses based daily volume status Enteric precautions F/U C.diff PCR and GIPP Daily CBC, BMP Vancomycin q24,  dosing per pharmacy Cefepime 2g q12h F/U Urine Cx Altered Mental Status: Suspect this is secondary to SIRS. BMP without severe electrolyte disturbances. CT head with no acute intracranial. F/U UDS Delirium precautions Macrocytic anemia  Hypoalbuminemia  Hypoproteinemia: Suggests problems with nourishment. TSH normal. Vitamin B12 normal. Her MCV was macrocytic during last admission as well.  Folate level RD consult  Chronic problems:  Bilateral breast  intertrigo: Nystatin power T2DM: SSI Dementia: holding donepezil and mirtazapine HTN: holding antihypertensives  Daily Safety Checklist: VTE: SQ Lovenox Diet: Dysphagia 3 IVF: no maintenance Code status: DNR (order brought with patient from facility)  Dispo: Admit patient to Observation with expected length of stay less than 2 midnights.  Signed: Vertell Novak, Medical Student 02/21/2022, 3:03 PM  Pager: [phone]  Attestation for Student Documentation:  I personally was present and performed or re-performed the history, physical exam and medical decision-making activities of this service and have verified that the service and findings are accurately documented in the student's note.  81 year old female with past medical history of CVA, DM, HTN, HLD, who presents to the hospital for acute encephalopathy.  Currently working up for infection, likely GI source.  Pending GI panel and C. difficile.  Continue vancomycin and cefepime for now.  Blood culture was obtained.  No structural abnormality on CT head.  No severe metabolic abnormality.  Gaylan Gerold, DO 02/21/2022, 6:53 PM

## 2022-02-21 NOTE — Sepsis Progress Note (Signed)
[facility] following for sepsis protocol. 

## 2022-02-21 NOTE — ED Notes (Signed)
ED TO INPATIENT HANDOFF REPORT  ED Nurse Name and Phone #: Andi Hence, RN  S Name/Age/Gender Julie Mora 81 y.o. female Room/Bed: 041C/041C  Code Status   Code Status: DNR  Home/SNF/Other Skilled nursing facility Patient oriented to: self Is this baseline? Yes   Triage Complete: Triage complete  Chief Complaint Acute encephalopathy [G93.40]  Triage Note Pt to ED via [facility] from [facility] in [facility].  Per EMS staff informed them that pt was more difficult to awaken on morning rounds and appeared to be more lethargic.  Pt alert and oriented to self which is baseline for patient.  Pt alert on arrival, denies any pain or other symptoms.  EMS VS: 110/70 70bpm 96% RA Cbg=114   Allergies Allergies  Allergen Reactions   Azathioprine Shortness Of Breath and Other (See Comments)    severly allergic. nausea, diarrhea, low blood pressure, fever, chills, rash. was hospitalized due to side effects in feb 2012. Other reaction(s): sob, nausea   Mycophenolate Mofetil Nausea Only    severe nausea Other reaction(s): Unknown   Ramipril Cough    Level of Care/Admitting Diagnosis ED Disposition     ED Disposition  Admit   Condition  --   [place]: [facility] [100100]  Level of Care: Telemetry Medical [104]  May place patient in observation at [facility] or [facility] if equivalent level of care is available:: No  Covid Evaluation: Asymptomatic - no recent exposure (last 10 days) testing not required  Diagnosis: Acute encephalopathy [893810]  Admitting Physician: Velna Ochs [[id number]]  Attending Physician: Velna Ochs [[id number]]          B Medical/Surgery History Past Medical History:  Diagnosis Date   Anemia    Essential hypertension, benign    Incontinence    Other and unspecified hyperlipidemia    Pemphigus foliaceous    Renal insufficiency    Type II or unspecified type diabetes mellitus  without mention of complication, not stated as uncontrolled    Vitamin D deficiency    Past Surgical History:  Procedure Laterality Date   CHOLECYSTECTOMY N/A 06/08/2017   Procedure: LAPAROSCOPIC CHOLECYSTECTOMY;  Surgeon: Kinsinger, Arta Bruce, MD;  Location: [facility];  Service: General;  Laterality: N/A;     A IV Location/Drains/Wounds Patient Lines/Drains/Airways Status     Active Line/Drains/Airways     Name Placement date Placement time Site Days   Peripheral IV 02/21/22 22 G Right Antecubital 02/21/22  0657  Antecubital  less than 1   Peripheral IV 02/21/22 20 G Left Antecubital 02/21/22  0657  Antecubital  less than 1   External Urinary Catheter 02/21/22  0659  --  less than 1   Incision (Closed) 06/08/17 Abdomen 06/08/17  1210  -- 1719   Incision - 4 Ports Abdomen Right;Lateral Right;Medial Umbilicus Upper;Mid 42/35/36  1120  -- 1719   Pressure Injury 01/20/17 Stage II -  Partial thickness loss of dermis presenting as a shallow open ulcer with a red, pink wound bed without slough. 01/20/17  2039  -- 1858   Pressure Injury 01/08/22 Buttocks Medial Stage 2 -  Partial thickness loss of dermis presenting as a shallow open injury with a red, pink wound bed without slough. 01/08/22  2110  -- 44   Wound / Incision (Open or Dehisced) 01/08/22 Non-pressure wound Pretibial Right;Anterior 01/08/22  1200  Pretibial  44   Wound / Incision (Open or Dehisced) 01/08/22 Pretibial Left;Anterior 01/08/22  1200  Pretibial  44            Intake/Output Last 24 hours  Intake/Output Summary (Last 24 hours) at 02/21/2022 2058 Last data filed at 02/21/2022 1610 Gross per 24 hour  Intake 2092.5 ml  Output --  Net 2092.5 ml    Labs/Imaging Results for orders placed or performed during the hospital encounter of 02/21/22 (from the past 48 hour(s))  Comprehensive metabolic panel     Status: Abnormal   Collection Time: 02/21/22  6:39 AM  Result Value Ref Range   Sodium 142 135 - 145 mmol/L    Potassium 4.3 3.5 - 5.1 mmol/L   Chloride 107 98 - 111 mmol/L   CO2 27 22 - 32 mmol/L   Glucose, Bld 93 70 - 99 mg/dL    Comment: Glucose reference range applies only to samples taken after fasting for at least 8 hours.   BUN 24 (H) 8 - 23 mg/dL   Creatinine, Ser 1.10 (H) 0.44 - 1.00 mg/dL   Calcium 8.4 (L) 8.9 - 10.3 mg/dL   Total Protein 5.5 (L) 6.5 - 8.1 g/dL   Albumin 1.7 (L) 3.5 - 5.0 g/dL   AST 19 15 - 41 U/L   ALT 11 0 - 44 U/L   Alkaline Phosphatase 66 38 - 126 U/L   Total Bilirubin 0.5 0.3 - 1.2 mg/dL   GFR, Estimated 51 (L) >60 mL/min    Comment: (NOTE) Calculated using the CKD-EPI Creatinine Equation (2021)    Anion gap 8 5 - 15    Comment: Performed at [facility], [place] [address] [place], [place] [address]  Lactic acid, plasma     Status: Abnormal   Collection Time: 02/21/22  6:39 AM  Result Value Ref Range   Lactic Acid, Venous 2.1 (HH) 0.5 - 1.9 mmol/L    Comment: CRITICAL RESULT CALLED TO, READ BACK BY AND VERIFIED WITH: A. DENNIS RN 02/21/2022 4098 DAVISB Performed at [facility], [place] [address], [place], [place] [address]   CBC with Differential     Status: Abnormal   Collection Time: 02/21/22  6:39 AM  Result Value Ref Range   WBC 9.1 4.0 - 10.5 K/uL   RBC 3.50 (L) 3.87 - 5.11 MIL/uL   Hemoglobin 11.2 (L) 12.0 - 15.0 g/dL   HCT 35.5 (L) 36.0 - 46.0 %   MCV 101.4 (H) 80.0 - 100.0 fL   MCH 32.0 26.0 - 34.0 pg   MCHC 31.5 30.0 - 36.0 g/dL   RDW 14.0 11.5 - 15.5 %   Platelets 246 150 - 400 K/uL   nRBC 0.0 0.0 - 0.2 %   Neutrophils Relative % 69 %   Neutro Abs 6.3 1.7 - 7.7 K/uL   Lymphocytes Relative 23 %   Lymphs Abs 2.0 0.7 - 4.0 K/uL   Monocytes Relative 6 %   Monocytes Absolute 0.6 0.1 - 1.0 K/uL   Eosinophils Relative 1 %   Eosinophils Absolute 0.1 0.0 - 0.5 K/uL   Basophils Relative 0 %   Basophils Absolute 0.0 0.0 - 0.1 K/uL   Immature Granulocytes 1 %   Abs Immature Granulocytes 0.05 0.00 - 0.07 K/uL    Comment: Performed at  [facility] [address] [place], [place] [address]  Protime-INR     Status: None   Collection Time: 02/21/22  6:39 AM  Result Value Ref Range   Prothrombin Time 13.7 11.4 - 15.2 seconds   INR 1.1 0.8 - 1.2  Comment: (NOTE) INR goal varies based on device and disease states. Performed at [facility], [place] [address], [place], [place] [address]   APTT     Status: None   Collection Time: 02/21/22  6:39 AM  Result Value Ref Range   aPTT 26 24 - 36 seconds    Comment: Performed at [facility] [address], [place], [place] [address]  TSH     Status: None   Collection Time: 02/21/22  6:39 AM  Result Value Ref Range   TSH 2.175 0.350 - 4.500 uIU/mL    Comment: Performed by a 3rd Generation assay with a functional sensitivity of <=0.01 uIU/mL. Performed at [facility], [place] [address], [place], [place] [address]   Vitamin B12     Status: None   Collection Time: 02/21/22  6:39 AM  Result Value Ref Range   Vitamin B-12 237 180 - 914 pg/mL    Comment: (NOTE) This assay is not validated for testing neonatal or myeloproliferative syndrome specimens for Vitamin B12 levels. Performed at [facility], [place] [address] [place], [place] [address]   Resp Panel by RT-PCR (Flu A&B, Covid) Peripheral     Status: None   Collection Time: 02/21/22  6:53 AM   Specimen: Peripheral; Nasal Swab  Result Value Ref Range   SARS Coronavirus 2 by RT PCR NEGATIVE NEGATIVE    Comment: (NOTE) SARS-CoV-2 target nucleic acids are NOT DETECTED.  The SARS-CoV-2 RNA is generally detectable in upper respiratory specimens during the acute phase of infection. The lowest concentration of SARS-CoV-2 viral copies this assay can detect is 138 copies/mL. A negative result does not preclude SARS-Cov-2 infection and should not be used as the sole basis for treatment or other patient management decisions. A negative result may occur with  improper specimen collection/handling,  submission of specimen other than nasopharyngeal swab, presence of viral mutation(s) within the areas targeted by this assay, and inadequate number of viral copies(<138 copies/mL). A negative result must be combined with clinical observations, patient history, and epidemiological information. The expected result is Negative.  Fact Sheet for Patients:  EntrepreneurPulse.com.au  Fact Sheet for Healthcare Providers:  IncredibleEmployment.be  This test is no t yet approved or cleared by the [place] FDA and  has been authorized for detection and/or diagnosis of SARS-CoV-2 by FDA under an Emergency Use Authorization (EUA). This EUA will remain  in effect (meaning this test can be used) for the duration of the COVID-19 declaration under Section 564(b)(1) of the Act, 21 U.S.C.section 360bbb-3(b)(1), unless the authorization is terminated  or revoked sooner.       Influenza A by PCR NEGATIVE NEGATIVE   Influenza B by PCR NEGATIVE NEGATIVE    Comment: (NOTE) The Xpert Xpress SARS-CoV-2/FLU/RSV plus assay is intended as an aid in the diagnosis of influenza from Nasopharyngeal swab specimens and should not be used as a sole basis for treatment. Nasal washings and aspirates are unacceptable for Xpert Xpress SARS-CoV-2/FLU/RSV testing.  Fact Sheet for Patients: EntrepreneurPulse.com.au  Fact Sheet for Healthcare Providers: IncredibleEmployment.be  This test is not yet approved or cleared by the [place] FDA and has been authorized for detection and/or diagnosis of SARS-CoV-2 by FDA under an Emergency Use Authorization (EUA). This EUA will remain in effect (meaning this test can be used) for the duration of the COVID-19 declaration under Section 564(b)(1) of the Act, 21 U.S.C. section 360bbb-3(b)(1), unless the authorization is terminated or revoked.  Performed  at [facility], [place] [address]  [place], [place] [address]   Urinalysis, Routine w reflex microscopic     Status: Abnormal   Collection Time: 02/21/22  9:05 AM  Result Value Ref Range   Color, Urine YELLOW YELLOW   APPearance CLEAR CLEAR   Specific Gravity, Urine 1.010 1.005 - 1.030   pH 6.0 5.0 - 8.0   Glucose, UA NEGATIVE NEGATIVE mg/dL   Hgb urine dipstick NEGATIVE NEGATIVE   Bilirubin Urine NEGATIVE NEGATIVE   Ketones, ur NEGATIVE NEGATIVE mg/dL   Protein, ur NEGATIVE NEGATIVE mg/dL   Nitrite NEGATIVE NEGATIVE   Leukocytes,Ua MODERATE (A) NEGATIVE   RBC / HPF 0-5 0 - 5 RBC/hpf   WBC, UA 21-50 0 - 5 WBC/hpf   Bacteria, UA NONE SEEN NONE SEEN    Comment: Performed at [facility] [address], [place], [place] [address]  Lactic acid, plasma     Status: None   Collection Time: 02/21/22  9:12 AM  Result Value Ref Range   Lactic Acid, Venous 1.8 0.5 - 1.9 mmol/L    Comment: Performed at [facility] [address], [place], [place] [address]  CBG monitoring, ED     Status: Abnormal   Collection Time: 02/21/22  8:55 PM  Result Value Ref Range   Glucose-Capillary 118 (H) 70 - 99 mg/dL    Comment: Glucose reference range applies only to samples taken after fasting for at least 8 hours.   CT Head Wo Contrast  Result Date: 02/21/2022 CLINICAL DATA:  Delirium. EXAM: CT HEAD WITHOUT CONTRAST TECHNIQUE: Contiguous axial images were obtained from the base of the skull through the vertex without intravenous contrast. RADIATION DOSE REDUCTION: This exam was performed according to the departmental dose-optimization program which includes automated exposure control, adjustment of the mA and/or kV according to patient size and/or use of iterative reconstruction technique. COMPARISON:  Head CT 01/08/2022 FINDINGS: Brain: There is no evidence of an acute infarct, intracranial hemorrhage, mass, midline shift, or extra-axial fluid collection. Chronic infarcts are again noted in the right frontal lobe, left parieto-occipital  region, and cerebellum. Hypodensities elsewhere in the cerebral white matter bilaterally are unchanged and nonspecific but compatible with mild chronic small vessel ischemic disease. There is mild cerebral atrophy. Vascular: Calcified atherosclerosis at the skull base. No hyperdense vessel. Skull: No fracture or suspicious osseous lesion. Sinuses/Orbits: Chronic left sphenoid sinusitis with near complete opacification of the sinus. Clear mastoid air cells. Bilateral cataract extraction. Other: None. IMPRESSION: 1. No evidence of acute intracranial abnormality. 2. Chronic ischemia with multiple old infarcts. Electronically Signed   By: Logan Bores M.D.   On: 02/21/2022 11:14   CT ABDOMEN PELVIS W CONTRAST  Result Date: 02/21/2022 CLINICAL DATA:  Diffuse abdominal pain. EXAM: CT ABDOMEN AND PELVIS WITH CONTRAST TECHNIQUE: Multidetector CT imaging of the abdomen and pelvis was performed using the standard protocol following bolus administration of intravenous contrast. RADIATION DOSE REDUCTION: This exam was performed according to the departmental dose-optimization program which includes automated exposure control, adjustment of the mA and/or kV according to patient size and/or use of iterative reconstruction technique. CONTRAST:  179m OMNIPAQUE IOHEXOL 300 MG/ML  SOLN COMPARISON:  MRI abdomen from 07/01/2018 and CT AP from 06/07/2017. FINDINGS: Lower chest: Trace left pleural effusion and small right pleural effusion noted. Hepatobiliary: No focal liver abnormality. Cholecystectomy. No bile duct dilatation. Pancreas: Unremarkable. No pancreatic ductal dilatation or surrounding inflammatory changes. Spleen: Normal in size without focal abnormality. Adrenals/Urinary  Tract: Previously characterized Bosniak class 2 F lesion within the inferior pole of the left kidney measures 4.4 by 2.8 cm, image 42/3. This measures 18 Hounsfield units on today's study. On the previous exam this mass was measured 5.1 x 3.7 cm  right-sided pelvocaliectasis is identified. No obstructing stone identified along the course of the right ureter. 2.2 cm diverticula arises off the anterior wall of the urinary bladder. Bladder is otherwise unremarkable. Stomach/Bowel: Stomach is normal. The appendix is not confidently identified. No signs of pericecal inflammation. Moderate retained stool identified within the rectum. There is mild wall thickening involving the rectum which may reflect underlying stercoral colitis. The remaining bowel loops are unremarkable without signs of wall thickening, inflammation or distension. Vascular/Lymphatic: Aortic atherosclerosis without aneurysm. No signs of abdominopelvic adenopathy. Reproductive: Uterus and bilateral adnexa are unremarkable. Other: No free fluid or fluid collections. Fat containing umbilical hernia is identified measuring 5.6 x 2.6 by 7.9 cm. Musculoskeletal: No acute or significant osseous findings. Multilevel degenerative disc disease identified within the lower thoracic and lumbar spine. No acute or suspicious osseous findings. IMPRESSION: 1. Moderate retained stool identified within the rectum with mild wall thickening which may reflect underlying stercoral colitis. 2. Mild right-sided pelvocaliectasis. No obstructing stone identified. 3. Previously characterized Bosniak class 85F lesion within the inferior pole of left kidney is mildly decreased in size from previous exam. 4. Fat containing umbilical hernia. 5. Trace left pleural effusion and small right pleural effusion. 6. Aortic Atherosclerosis (ICD10-I70.0). Electronically Signed   By: Kerby Moors M.D.   On: 02/21/2022 11:28   DG Chest Port 1 View  Result Date: 02/21/2022 CLINICAL DATA:  Sepsis. EXAM: PORTABLE CHEST 1 VIEW COMPARISON:  01/08/2022 FINDINGS: Stable cardiomediastinal contours. Lung volumes are low and there is rotational artifact. Chronic interstitial coarsening is noted bilaterally. No superimposed pleural effusion,  interstitial edema or airspace disease. IMPRESSION: 1. Low lung volumes. 2. Chronic interstitial coarsening. Electronically Signed   By: Kerby Moors M.D.   On: 02/21/2022 06:30    Pending Labs Unresulted Labs (From admission, onward)     Start     Ordered   02/22/22 5093  Basic metabolic panel  Tomorrow morning,   R        02/21/22 1316   02/22/22 0500  CBC  Tomorrow morning,   R        02/21/22 1316   02/21/22 1707  C Difficile Quick Screen w PCR reflex  (C Difficile quick screen w PCR reflex panel )  Once, for 24 hours,   TIMED       References:    CDiff Information Tool   02/21/22 1706   02/21/22 1522  Rapid urine drug screen (hospital performed)  Add-on,   AD        02/21/22 1521   02/21/22 1504  Gastrointestinal Panel by PCR , Stool  (Gastrointestinal Panel by PCR, Stool                                                                                                                                                     **  Does Not include CLOSTRIDIUM DIFFICILE testing. **If CDIFF testing is needed, place order from the "C Difficile Testing" order set.**)  ONCE - URGENT,   URGENT       Question:  Patient immune status  Answer:  Normal   02/21/22 1503   02/21/22 0607  Urine Culture  (Septic presentation on arrival (screening labs, nursing and treatment orders for obvious sepsis))  ONCE - URGENT,   URGENT       Question:  Indication  Answer:  Sepsis   02/21/22 0607   02/21/22 0604  Culture, blood (Routine x 2)  BLOOD CULTURE X 2,   R      02/21/22 0603            Vitals/Pain Today's Vitals   02/21/22 1733 02/21/22 1734 02/21/22 1906 02/21/22 2000  BP: 104/74 104/74 (!) 96/56 119/77  Pulse:  (!) 104 (!) 107 100  Resp:  (!) '22 20 17  '$ Temp:      TempSrc:      SpO2:  93% 96% 96%  Weight:      Height:      PainSc:        Isolation Precautions Enteric precautions (UV disinfection)  Medications Medications  ceFEPIme (MAXIPIME) 2 g in sodium chloride 0.9 % 100 mL IVPB (has  no administration in time range)  vancomycin (VANCOCIN) IVPB 1000 mg/200 mL premix (has no administration in time range)  aspirin chewable tablet 81 mg (81 mg Oral Given 02/21/22 1629)  simvastatin (ZOCOR) tablet 20 mg (has no administration in time range)  enoxaparin (LOVENOX) injection 50 mg (50 mg Subcutaneous Given 02/21/22 1734)  acetaminophen (TYLENOL) tablet 650 mg (has no administration in time range)    Or  acetaminophen (TYLENOL) suppository 650 mg (has no administration in time range)  nystatin (MYCOSTATIN/NYSTOP) topical powder (has no administration in time range)  insulin aspart (novoLOG) injection 0-15 Units (has no administration in time range)  insulin aspart (novoLOG) injection 0-5 Units (has no administration in time range)  ceFEPIme (MAXIPIME) 2 g in sodium chloride 0.9 % 100 mL IVPB (0 g Intravenous Stopped 02/21/22 0737)  metroNIDAZOLE (FLAGYL) IVPB 500 mg (0 mg Intravenous Stopped 02/21/22 0800)  lactated ringers bolus 1,000 mL (0 mLs Intravenous Stopped 02/21/22 0812)    And  lactated ringers bolus 1,000 mL (0 mLs Intravenous Stopped 02/21/22 0929)    And  lactated ringers bolus 1,000 mL (0 mLs Intravenous Stopped 02/21/22 1227)    And  lactated ringers bolus 500 mL (0 mLs Intravenous Stopped 02/21/22 1227)  vancomycin (VANCOREADY) IVPB 1500 mg/300 mL (0 mg Intravenous Stopped 02/21/22 1020)  iohexol (OMNIPAQUE) 300 MG/ML solution 100 mL (100 mLs Intravenous Contrast Given 02/21/22 1106)    Mobility non-ambulatory High fall risk   Focused Assessments Cardiac Assessment Handoff:  Cardiac Rhythm: Atrial fibrillation Lab Results  Component Value Date   CKTOTAL 12 09/02/2009   CKMB 23.8 (H) 07/24/2009   TROPONINI (H) 07/24/2009    0.18        PERSISTENTLY INCREASED TROPONIN VALUES IN THE RANGE OF 0.06-0.49 ng/mL CAN BE SEEN IN:       -UNSTABLE ANGINA       -CONGESTIVE HEART FAILURE       -MYOCARDITIS       -CHEST TRAUMA       -ARRYHTHMIAS       -LATE PRESENTING MI        -COPD   CLINICAL FOLLOW-UP RECOMMENDED.   Lab Results  Component Value Date  DDIMER 1.75 (H) 05/21/2020   Does the Patient currently have chest pain? No   , Neuro Assessment Handoff:  Swallow screen pass? Yes  Cardiac Rhythm: Atrial fibrillation       Neuro Assessment: Exceptions to [facility] Neuro Checks:      Last Documented NIHSS Modified Score:   Has TPA been given? No If patient is a Neuro Trauma and patient is going to OR before floor call report to [facility] nurse: [phone] or [phone]  , Pulmonary Assessment Handoff:  Lung sounds:   O2 Device: Room Air      R Recommendations: See Admitting Provider Note  Report given to:   Additional Notes: patient is C diff precautions, stool sample is currently pending in lab. On Purewick. A&OX1 and bed bound- at baseline per report. From [facility].

## 2022-02-21 NOTE — ED Provider Notes (Signed)
  Provider Note MRN:  [id number]  Arrival date & time: 02/21/22    ED Course and Medical Decision Making  Assumed care from C HORTON at shift change.  See not from prior team for complete details, in brief:  81 yo female from SNF /[facility] for lethargy/ams PMHx includes CVA, hypertension, type 2 diabetes, hyperlipidemia, recent admission secondary to septic shock associated with UTI last month. DNR  Rtemp 94.1, hypotensive on arrival, delirium  Sepsis workup initiated by night team  Plan per prior physician laboratory evaluation, remainder of work-up, likely admission.  Labs reviewed, urinalysis with sterile pyuria (possible a/w recent urosepsis?), lactic acid initially 2.1.  CT abdomen pelvis concerning for colitis.  Patient denies diarrhea, Nausea or vomiting.  Head CT concerning for chronic infarcts, no acute changes.  Chest x-ray without evidence of acute abnormality.  I viewed images and agree with radiology interpretation.  Patient was SIRS, no clear source infection.  Broad-spectrum antibiotics were started by prior team.  She also received sepsis bolus.  Lengthy discussion with patient and family at bedside.  Given patient's continued delirium, high risk for decompensation.  Would recommend admission for SIRS.  Patient and family are agreeable.  Discussed with IMTS, accepts patient for admission   .Critical Care Performed by: Jeanell Sparrow, DO Authorized by: Jeanell Sparrow, DO   Critical care provider statement:    Critical care time (minutes):  30   Critical care time was exclusive of:  Separately billable procedures and treating other patients   Critical care was necessary to treat or prevent imminent or life-threatening deterioration of the following conditions: sirs.   Critical care was time spent personally by me on the following activities:  Development of treatment plan with patient or surrogate, discussions with consultants, evaluation of patient's response to  treatment, examination of patient, ordering and review of laboratory studies, ordering and review of radiographic studies, ordering and performing treatments and interventions, pulse oximetry, re-evaluation of patient's condition, review of old charts and obtaining history from patient or surrogate   Care discussed with: admitting provider   Comments:     Hypotensive, hypothermic, ams; SIRS, broad spectrum abx  Final Clinical Impressions(s) / ED Diagnoses     ICD-10-CM   1. Altered mental status, unspecified altered mental status type  R41.82     2. SIRS (systemic inflammatory response syndrome) (HCC)  R65.10     3. Colitis  K52.9       ED Discharge Orders     None       Discharge Instructions   None           Jeanell Sparrow, DO 02/21/22 1534

## 2022-02-21 NOTE — ED Notes (Signed)
Moderate to large amount of loose malodorous stool, sample collected, admitting team alerted to areas of skin breakdown under breast and back, barrier cream applied.

## 2022-02-21 NOTE — Progress Notes (Signed)
Notified bedside nurse of need to draw repeat lactic acid. 

## 2022-02-21 NOTE — ED Triage Notes (Signed)
Pt to ED via [facility] from [facility] in [place].  Per EMS staff informed them that pt was more difficult to awaken on morning rounds and appeared to be more lethargic.  Pt alert and oriented to self which is baseline for patient.  Pt alert on arrival, denies any pain or other symptoms.  EMS VS: 110/70 70bpm 96% RA Cbg=114

## 2022-02-21 NOTE — ED Notes (Signed)
Family at bedside, updated on POC Per previous nurse report pt saturated with urine on arrival, will reassess for urine sample after first bolus.

## 2022-02-21 NOTE — ED Provider Notes (Signed)
[facility] EMERGENCY DEPARTMENT Provider Note   CSN: [id number] Arrival date & time: 02/21/22  0558     History  Chief Complaint  Patient presents with   Altered Mental Status    Julie Mora is a 81 y.o. female.  HPI     This is an 81 year old female who presents from [place].  Per facility, patient was more difficult to awaken this morning and lethargic.  Reported baseline is oriented to herself.  Per EMS, patient was noted to be sleepy but arousable.  Vital signs stable in route.  CBG 114.  On my evaluation, patient is arousable.  She is only oriented to herself.  She denies any complaints at this time and states "they kidnapped me."  Level 5 caveat for dementia  Home Medications Prior to Admission medications   Medication Sig Start Date End Date Taking? Authorizing Provider  ACCU-CHEK AVIVA PLUS test strip USE TO CHECK BLOOD SUGAR BEFORE BREAKFAST AND SUPPER 11/09/14   Elby Showers, MD  acetaminophen (TYLENOL) 325 MG tablet Take 2 tablets (650 mg total) by mouth every 6 (six) hours as needed for mild pain, fever or headache. 01/17/22   Oswald Hillock, MD  ALPRAZolam Duanne Moron) 0.25 MG tablet Take 1 tablet (0.25 mg total) by mouth 2 (two) times daily as needed (agitation). 01/17/22   Oswald Hillock, MD  aspirin 81 MG chewable tablet Chew 81 mg by mouth daily.    [provider]  BD PEN NEEDLE NANO U/F 32G X 4 MM MISC USE AS DIRECTED 01/11/15   Elby Showers, MD  donepezil (ARICEPT) 10 MG tablet Take 10 mg by mouth daily. 12/26/21   [provider]  ferrous gluconate (FERGON) 324 MG tablet TAKE 1 TABLET(324 MG) BY MOUTH DAILY WITH BREAKFAST Patient taking differently: Take 324 mg by mouth daily. TAKE 1 TABLET(324 MG) BY MOUTH DAILY WITH BREAKFAST 06/28/20   Elby Showers, MD  gabapentin (NEURONTIN) 100 MG capsule Take 100 mg by mouth at bedtime.    [provider]  magnesium gluconate (MAGONATE) 500 MG tablet Take 500 mg by  mouth daily. 07/29/21   [provider]  memantine (NAMENDA) 5 MG tablet Take 5 mg by mouth 2 (two) times daily. 12/26/21   [provider]  Menthol-Zinc Oxide (CALMOSEPTINE) 0.44-20.6 % OINT Apply 1 application. topically in the morning and at bedtime. Apply to buttock and per-area topically every day and night shift for redness for 90 days . (Began 03.23.23).    [provider]  METAMUCIL FIBER PO Take 0.4 g by mouth at bedtime.    [provider]  mirtazapine (REMERON) 15 MG tablet Take 0.5 tablets (7.5 mg total) by mouth at bedtime. 01/17/22   Oswald Hillock, MD  NOVOLOG FLEXPEN 100 UNIT/ML FlexPen Inject 0-15 Units into the skin 3 (three) times daily with meals. 70-120=0 units; 121-150=2 units; 151-200= 3 units; 201-250=5 units; 251-300=8 units; 301-350=11 units; 351-400= 15 units.  401 or greater, call 911. 01/05/22   [provider]  Polyethyl Glycol-Propyl Glycol (SYSTANE) 0.4-0.3 % SOLN Apply 1 drop to eye in the morning, at noon, in the evening, and at bedtime. Dry eyes.    [provider]  polyethylene glycol (MIRALAX / GLYCOLAX) 17 g packet Take 17 g by mouth every other day. **Hold for loose stool.    [provider]  prednisoLONE acetate (PRED FORTE) 1 % ophthalmic suspension Place 1 drop into both eyes in the morning  and at bedtime.    [provider]  senna-docusate (SENOKOT-S) 8.6-50 MG tablet Take 1 tablet by mouth at bedtime. **Hold for loose stools.    [provider]  simvastatin (ZOCOR) 20 MG tablet Take 1 tablet (20 mg total) by mouth at bedtime. 06/28/20   Elby Showers, MD      Allergies    Azathioprine, Mycophenolate mofetil, Ramipril, and Ramipril    Review of Systems   Review of Systems  Unable to perform ROS: Dementia   Physical Exam Updated Vital Signs BP 96/70 (BP Location: Right Arm)   Pulse 60   Temp (!) 94.1 F (34.5 C) (Oral)   Resp 20   Ht 1.651 m ('5\' 5"'$ )   Wt 108.9 kg   LMP   (LMP Unknown)   SpO2 100%   BMI 39.94 kg/m  Physical Exam Vitals and nursing note reviewed.  Constitutional:      Appearance: She is well-developed.     Comments: Elderly lady, chronically ill-appearing, nontoxic  HENT:     Head: Normocephalic and atraumatic.     Mouth/Throat:     Mouth: Mucous membranes are dry.  Eyes:     Pupils: Pupils are equal, round, and reactive to light.     Comments: Purulent drainage bilateral eyes, no conjunctival injection  Cardiovascular:     Rate and Rhythm: Normal rate and regular rhythm.     Heart sounds: Normal heart sounds.  Pulmonary:     Effort: Pulmonary effort is normal. No respiratory distress.     Breath sounds: No wheezing.  Abdominal:     General: Bowel sounds are normal.     Palpations: Abdomen is soft.     Tenderness: There is no abdominal tenderness. There is no guarding or rebound.  Musculoskeletal:     Cervical back: Neck supple.     Right lower leg: Edema present.     Left lower leg: Edema present.  Skin:    General: Skin is warm and dry.     Comments: Chronic stasis changes of bilateral lower extremities, she does have erythema bilaterally.  No oozing.  Neurological:     Mental Status: She is alert.     Comments: Oriented only to herself, arousable to voice, moves all 4 extremities    ED Results / Procedures / Treatments   Labs (all labs ordered are listed, but only abnormal results are displayed) Labs Reviewed  CULTURE, BLOOD (ROUTINE X 2)  CULTURE, BLOOD (ROUTINE X 2)  RESP PANEL BY RT-PCR (FLU A&B, COVID) ARPGX2  URINE CULTURE  COMPREHENSIVE METABOLIC PANEL  LACTIC ACID, PLASMA  LACTIC ACID, PLASMA  CBC WITH DIFFERENTIAL/PLATELET  PROTIME-INR  URINALYSIS, ROUTINE W REFLEX MICROSCOPIC  APTT    EKG EKG Interpretation  Date/Time:  Tuesday February 21 2022 06:09:48 EDT Ventricular Rate:  65 PR Interval:  154 QRS Duration: 104 QT Interval:  446 QTC Calculation: 464 R Axis:   -52 Text Interpretation: Sinus  rhythm Left anterior fascicular block Consider anterior infarct Confirmed by Thayer Jew ([id number]) on 02/21/2022 6:39:23 AM  Radiology DG Chest Port 1 View  Result Date: 02/21/2022 CLINICAL DATA:  Sepsis. EXAM: PORTABLE CHEST 1 VIEW COMPARISON:  01/08/2022 FINDINGS: Stable cardiomediastinal contours. Lung volumes are low and there is rotational artifact. Chronic interstitial coarsening is noted bilaterally. No superimposed pleural effusion, interstitial edema or airspace disease. IMPRESSION: 1. Low lung volumes. 2. Chronic interstitial coarsening. Electronically Signed   By: Kerby Moors M.D.   On: 02/21/2022 06:30  Procedures Procedures    Medications Ordered in ED Medications  lactated ringers infusion (has no administration in time range)  ceFEPIme (MAXIPIME) 2 g in sodium chloride 0.9 % 100 mL IVPB (has no administration in time range)  metroNIDAZOLE (FLAGYL) IVPB 500 mg (has no administration in time range)  vancomycin (VANCOCIN) IVPB 1000 mg/200 mL premix (has no administration in time range)  lactated ringers bolus 1,000 mL (has no administration in time range)    And  lactated ringers bolus 1,000 mL (has no administration in time range)    And  lactated ringers bolus 1,000 mL (has no administration in time range)    And  lactated ringers bolus 500 mL (has no administration in time range)    ED Course/ Medical Decision Making/ A&P                           Medical Decision Making Amount and/or Complexity of Data Reviewed Labs: ordered. Radiology: ordered. ECG/medicine tests: ordered.  Risk Prescription drug management.   This patient presents to the ED for concern of lethargy, altered mental status, this involves an extensive number of treatment options, and is a complaint that carries with it a high risk of complications and morbidity.  I considered the following differential and admission for this acute, potentially life threatening condition.  The differential  diagnosis includes sepsis, stroke, head bleed, hypoglycemia, metabolic derangement  MDM:    This is an 81 year old female who presents with reported lethargy.  She is nontoxic-appearing.  She is arousable and will converse a but is only oriented to herself which is her baseline per her report.  Vital signs notable for temperature of 94.1.  I have requested rectal temp to confirm.  Blood pressure 96/70.  Physical exam is largely benign.  She has no obvious neurologic deficits.  No obvious abdominal pain or respiratory distress.  Given her temperature and low blood pressure, sepsis work-up was initiated.  30 cc/kg of fluid and antibiotics were ordered.  Patient is DNR.  Patient signed out to oncoming provider  (Labs, imaging, consults)  Labs: I Ordered, and personally interpreted labs.  The pertinent results include: Pending  Imaging Studies ordered: I ordered imaging studies including pending I independently visualized and interpreted imaging. I agree with the radiologist interpretation  Additional history obtained from chart review and EMS.  External records from outside source obtained and reviewed including CODE STATUS  Cardiac Monitoring: The patient was maintained on a cardiac monitor.  I personally viewed and interpreted the cardiac monitored which showed an underlying rhythm of: Normal sinus rhythm  Reevaluation: After the interventions noted above, I reevaluated the patient and found that they have :stayed the same  Social Determinants of Health: Lives in a nursing facility  Disposition: Pending  Co morbidities that complicate the patient evaluation  Past Medical History:  Diagnosis Date   Anemia    Essential hypertension, benign    Incontinence    Other and unspecified hyperlipidemia    Pemphigus foliaceous    Renal insufficiency    Type II or unspecified type diabetes mellitus without mention of complication, not stated as uncontrolled    Vitamin D deficiency       Medicines Meds ordered this encounter  Medications   lactated ringers infusion   ceFEPIme (MAXIPIME) 2 g in sodium chloride 0.9 % 100 mL IVPB    Order Specific Question:   Antibiotic Indication:    Answer:   Other  Indication (list below)    Order Specific Question:   Other Indication:    Answer:   Unknown source   metroNIDAZOLE (FLAGYL) IVPB 500 mg    Order Specific Question:   Antibiotic Indication:    Answer:   Other Indication (list below)    Order Specific Question:   Other Indication:    Answer:   Unknown source   vancomycin (VANCOCIN) IVPB 1000 mg/200 mL premix    Order Specific Question:   Indication:    Answer:   Other Indication (list below)    Order Specific Question:   Other Indication:    Answer:   Unknown source   AND Linked Order Group    lactated ringers bolus 1,000 mL     Order Specific Question:   Total Body Weight basis for 30 mL/kg  bolus delivery     Answer:   108.9 kg    lactated ringers bolus 1,000 mL     Order Specific Question:   Total Body Weight basis for 30 mL/kg  bolus delivery     Answer:   108.9 kg    lactated ringers bolus 1,000 mL     Order Specific Question:   Total Body Weight basis for 30 mL/kg  bolus delivery     Answer:   108.9 kg    lactated ringers bolus 500 mL     Order Specific Question:   Total Body Weight basis for 30 mL/kg  bolus delivery     Answer:   108.9 kg    I have reviewed the patients home medicines and have made adjustments as needed  Problem List / ED Course: Problem List Items Addressed This Visit   None               Final Clinical Impression(s) / ED Diagnoses Final diagnoses:  None    Rx / DC Orders ED Discharge Orders     None         Merryl Hacker, MD 02/21/22 [phone]

## 2022-02-21 NOTE — Progress Notes (Signed)
Pharmacy Antibiotic Note  Julie Mora is a 81 y.o. female admitted on 02/21/2022 with sepsis.  Pharmacy has been consulted for cefepime and vancomycin dosing for 7 days.  WBC 9.1, Temp 95 F, Lactate 2.1 HR: 103, RR: 25 SCr 1.1  Plan: Start Cefepime 2g IV q12h Vancomycin '1500mg'$  IV x 1, followed by  Vancomycin '1000mg'$  IV q24h (eAUC~527)  >Goal AUC 400-550 Continue metronidazole '500mg'$  IV q12h per MD Monitor WBC, temp, SCr, and clinical s/sx of infection daily F/u cultures and de-escalate antibiotics as able Check vancomycin levels once steady-state achieved  Height: '5\' 5"'$  (165.1 cm) Weight: 108.9 kg (240 lb) IBW/kg (Calculated) : 57  Temp (24hrs), Avg:94.6 F (34.8 C), Min:94.1 F (34.5 C), Max:95 F (35 C)  Recent Labs  Lab 02/21/22 0639  WBC 9.1  CREATININE 1.10*  LATICACIDVEN 2.1*    Estimated Creatinine Clearance: 50.1 mL/min (A) (by C-G formula based on SCr of 1.1 mg/dL (H)).    Allergies  Allergen Reactions   Azathioprine Shortness Of Breath and Other (See Comments)    severly allergic. nausea, diarrhea, low blood pressure, fever, chills, rash. was hospitalized due to side effects in feb 2012. Other reaction(s): sob, nausea   Mycophenolate Mofetil Nausea Only    severe nausea Other reaction(s): Unknown   Ramipril Cough   Ramipril     Other reaction(s): cough    Antimicrobials this admission: Vancomycin 6/6 >>  Cefepime 6/6 >>  Metronidazole 6/6 >>   Dose adjustments this admission: N/A  Microbiology results: 6/6 BCx: pending 6/6 UCx: pending   Thank you for allowing pharmacy to be a part of this patient's care.  Kaleen Mask 02/21/2022 8:14 AM

## 2022-02-21 NOTE — ED Notes (Signed)
Floor contacted to initiate purple man process

## 2022-02-22 ENCOUNTER — Observation Stay (HOSPITAL_COMMUNITY): Payer: Medicare PPO

## 2022-02-22 DIAGNOSIS — K529 Noninfective gastroenteritis and colitis, unspecified: Secondary | ICD-10-CM | POA: Diagnosis not present

## 2022-02-22 DIAGNOSIS — G9340 Encephalopathy, unspecified: Secondary | ICD-10-CM

## 2022-02-22 DIAGNOSIS — R6510 Systemic inflammatory response syndrome (SIRS) of non-infectious origin without acute organ dysfunction: Secondary | ICD-10-CM

## 2022-02-22 DIAGNOSIS — K5289 Other specified noninfective gastroenteritis and colitis: Secondary | ICD-10-CM

## 2022-02-22 LAB — GASTROINTESTINAL PANEL BY PCR, STOOL (REPLACES STOOL CULTURE)

## 2022-02-22 LAB — BASIC METABOLIC PANEL
Anion gap: 12 (ref 5–15)
BUN: 20 mg/dL (ref 8–23)
CO2: 19 mmol/L — ABNORMAL LOW (ref 22–32)
Calcium: 7.9 mg/dL — ABNORMAL LOW (ref 8.9–10.3)
Chloride: 108 mmol/L (ref 98–111)
Creatinine, Ser: 0.98 mg/dL (ref 0.44–1.00)
GFR, Estimated: 58 mL/min — ABNORMAL LOW (ref >60)
Glucose, Bld: 88 mg/dL (ref 70–99)
Potassium: 3.9 mmol/L (ref 3.5–5.1)
Sodium: 139 mmol/L (ref 135–145)

## 2022-02-22 LAB — CBC
HCT: 35.8 % — ABNORMAL LOW (ref 36.0–46.0)
Hemoglobin: 11.1 g/dL — ABNORMAL LOW (ref 12.0–15.0)
MCH: 31.4 pg (ref 26.0–34.0)
MCHC: 31 g/dL (ref 30.0–36.0)
MCV: 101.1 fL — ABNORMAL HIGH (ref 80.0–100.0)
Platelets: 200 10*3/uL (ref 150–400)
RBC: 3.54 MIL/uL — ABNORMAL LOW (ref 3.87–5.11)
RDW: 14 % (ref 11.5–15.5)
WBC: 7.6 10*3/uL (ref 4.0–10.5)
nRBC: 0 % (ref 0.0–0.2)

## 2022-02-22 LAB — GLUCOSE, CAPILLARY
Glucose-Capillary: 91 mg/dL (ref 70–99)
Glucose-Capillary: 112 mg/dL — ABNORMAL HIGH (ref 70–99)
Glucose-Capillary: 151 mg/dL — ABNORMAL HIGH (ref 70–99)
Glucose-Capillary: 134 mg/dL — ABNORMAL HIGH (ref 70–99)

## 2022-02-22 LAB — MRSA NEXT GEN BY PCR, NASAL: MRSA by PCR Next Gen: DETECTED — AB

## 2022-02-22 MED ORDER — FLUCONAZOLE 150 MG PO TABS
150.0000 mg | ORAL_TABLET | ORAL | Status: DC
  Administered 2022-02-22: 150 mg via ORAL
  Filled 2022-02-22 (×2): qty 1

## 2022-02-22 MED ORDER — ENSURE ENLIVE PO LIQD: 237.0000 mL | Freq: Two times a day (BID) | ORAL | Status: DC

## 2022-02-22 MED ORDER — SENNOSIDES-DOCUSATE SODIUM 8.6-50 MG PO TABS
3.0000 | ORAL_TABLET | Freq: Two times a day (BID) | ORAL | Status: DC
Start: 2022-02-22 — End: 2022-02-23
  Administered 2022-02-22: 3 via ORAL
  Filled 2022-02-22 (×2): qty 3

## 2022-02-22 MED ORDER — SODIUM CHLORIDE 0.9 % IV SOLN: INTRAVENOUS | Status: AC

## 2022-02-22 MED ORDER — SODIUM CHLORIDE 0.9 % IV SOLN
1.0000 g | INTRAVENOUS | Status: DC
  Administered 2022-02-22: 1 g via INTRAVENOUS
  Filled 2022-02-22: qty 10

## 2022-02-22 MED ORDER — POLYETHYLENE GLYCOL 3350 17 G PO PACK
17.0000 g | PACK | Freq: Two times a day (BID) | ORAL | Status: DC
  Administered 2022-02-22: 17 g via ORAL
  Filled 2022-02-22 (×2): qty 1

## 2022-02-22 MED ORDER — SORBITOL 70 % SOLN
400.0000 mL | TOPICAL_OIL | Freq: Once | ORAL | Status: AC
  Administered 2022-02-22: 400 mL via RECTAL
  Filled 2022-02-22: qty 120

## 2022-02-22 MED ORDER — ADULT MULTIVITAMIN W/MINERALS CH
1.0000 | ORAL_TABLET | Freq: Every day | ORAL | Status: DC
  Administered 2022-02-22: 1 via ORAL
  Filled 2022-02-22 (×2): qty 1

## 2022-02-22 NOTE — Progress Notes (Signed)
PT Cancellation/Discharge Note  Patient Details Name: Julie Mora MRN: [id number] DOB: 06-Dec-1940   Cancelled Treatment:    Reason Eval/Treat Not Completed: PT screened, no needs identified, will sign off  Attempted to work with pt and she adamantly refused any mobility. [name], RN at [facility] and was told pt is a long-term care resident and is total assist for all mobility (including use of hoyer) and all ADLs. Patient has no PT needs and can return to [facility] without therapy referral.    [place]  Office [phone]   [name] 02/22/2022, 8:59 AM

## 2022-02-22 NOTE — Progress Notes (Addendum)
Patient Summary Julie Mora is a 81 y.o. female with a PMHx of dementia, fecal incontinence, pemphigus foliaceous, T2DM, HTN who presented to the ED from [facility] after the staff there were concerned for sepsis.  Subjective: Overnight, no acute events  On interview this morning, Julie Mora alert but unoriented.  She reports not chest pain, no shortness of breath, no belly pain, and no diarrhea.  Objective:  Vital signs in last 24 hours: Vitals:   02/22/22 0200 02/22/22 0330 02/22/22 0400 02/22/22 0500  BP: (!) 94/54  106/60 91/80  Pulse:   (!) 111 (!) 113  Resp: 19  (!) 21 20  Temp: 98.1 F (36.7 C)  98.7 F (37.1 C) 98.7 F (37.1 C)  TempSrc:      SpO2:  93% 98% 96%  Weight:      Height:       Hypoxemic O/N, put on 2L [place]  On interview, SpO2 91% on room air  Weight change:   Intake/Output Summary (Last 24 hours) at 02/22/2022 1610 Last data filed at 02/22/2022 0600 Gross per 24 hour  Intake 2292.5 ml  Output 400 ml  Net 1892.5 ml   MAR Review: 1X PRN tylenol  Physical Exam GEN: in no acute distress HEENT: lacking teeth, yellowish material coming out of tear ducts CV: regular rhythm, no murmurs, rubs, or gallops PULM: apices clear to auscultation ABD: bowel sounds present, obese, soft-nontender EXT: warm, edematous PSY: alert without fluctuation in level of consciousness, affect agitated, unoriented, increased confusion compared to yesterday   Labs:    Latest Ref Rng & Units 02/22/2022    6:11 AM 02/21/2022    6:39 AM 01/14/2022    4:00 PM  BMP  Glucose 70 - 99 mg/dL 88   93   113    BUN 8 - 23 mg/dL '20   24   8    '$ Creatinine 0.44 - 1.00 mg/dL 0.98   1.10   0.72    Sodium 135 - 145 mmol/L 139   142   136    Potassium 3.5 - 5.1 mmol/L 3.9   4.3   4.3    Chloride 98 - 111 mmol/L 108   107   111    CO2 22 - 32 mmol/L '19   27   20    '$ Calcium 8.9 - 10.3 mg/dL 7.9   8.4   7.9        Latest Ref Rng & Units 02/22/2022    6:11 AM 02/21/2022    6:39 AM  01/14/2022    6:18 PM  CBC  WBC 4.0 - 10.5 K/uL 7.6   9.1   9.1    Hemoglobin 12.0 - 15.0 g/dL 11.1   11.2   12.2    Hematocrit 36.0 - 46.0 % 35.8   35.5   39.0    Platelets 150 - 400 K/uL 200   246   287     Results for orders placed or performed during the hospital encounter of 02/21/22 (from the past 24 hour(s))  Gastrointestinal Panel by PCR , Stool     Status: None   Collection Time: 02/21/22  3:04 PM   Specimen: Stool  Result Value Ref Range   Campylobacter species NOT DETECTED NOT DETECTED   Plesimonas shigelloides NOT DETECTED NOT DETECTED   Salmonella species NOT DETECTED NOT DETECTED   Yersinia enterocolitica NOT DETECTED NOT DETECTED   Vibrio species NOT DETECTED NOT DETECTED   Vibrio cholerae NOT  DETECTED NOT DETECTED   Enteroaggregative E coli (EAEC) NOT DETECTED NOT DETECTED   Enteropathogenic E coli (EPEC) NOT DETECTED NOT DETECTED   Enterotoxigenic E coli (ETEC) NOT DETECTED NOT DETECTED   Shiga like toxin producing E coli (STEC) NOT DETECTED NOT DETECTED   Shigella/Enteroinvasive E coli (EIEC) NOT DETECTED NOT DETECTED   Cryptosporidium NOT DETECTED NOT DETECTED   Cyclospora cayetanensis NOT DETECTED NOT DETECTED   Entamoeba histolytica NOT DETECTED NOT DETECTED   Giardia lamblia NOT DETECTED NOT DETECTED   Adenovirus F40/41 NOT DETECTED NOT DETECTED   Astrovirus NOT DETECTED NOT DETECTED   Norovirus GI/GII NOT DETECTED NOT DETECTED   Rotavirus A NOT DETECTED NOT DETECTED   Sapovirus (I, II, IV, and V) NOT DETECTED NOT DETECTED  CBG monitoring, ED     Status: Abnormal   Collection Time: 02/21/22  8:55 PM  Result Value Ref Range   Glucose-Capillary 118 (H) 70 - 99 mg/dL  Basic metabolic panel     Status: Abnormal   Collection Time: 02/22/22  6:11 AM  Result Value Ref Range   Sodium 139 135 - 145 mmol/L   Potassium 3.9 3.5 - 5.1 mmol/L   Chloride 108 98 - 111 mmol/L   CO2 19 (L) 22 - 32 mmol/L   Glucose, Bld 88 70 - 99 mg/dL   BUN 20 8 - 23 mg/dL    Creatinine, Ser 0.98 0.44 - 1.00 mg/dL   Calcium 7.9 (L) 8.9 - 10.3 mg/dL   GFR, Estimated 58 (L) >60 mL/min   Anion gap 12 5 - 15  CBC     Status: Abnormal   Collection Time: 02/22/22  6:11 AM  Result Value Ref Range   WBC 7.6 4.0 - 10.5 K/uL   RBC 3.54 (L) 3.87 - 5.11 MIL/uL   Hemoglobin 11.1 (L) 12.0 - 15.0 g/dL   HCT 35.8 (L) 36.0 - 46.0 %   MCV 101.1 (H) 80.0 - 100.0 fL   MCH 31.4 26.0 - 34.0 pg   MCHC 31.0 30.0 - 36.0 g/dL   RDW 14.0 11.5 - 15.5 %   Platelets 200 150 - 400 K/uL   nRBC 0.0 0.0 - 0.2 %  MRSA Next Gen by PCR, Nasal     Status: Abnormal   Collection Time: 02/22/22  6:26 AM   Specimen: Nasal Mucosa; Nasal Swab  Result Value Ref Range   MRSA by PCR Next Gen DETECTED (A) NOT DETECTED  Glucose, capillary     Status: None   Collection Time: 02/22/22  9:59 AM  Result Value Ref Range   Glucose-Capillary 91 70 - 99 mg/dL  Glucose, capillary     Status: Abnormal   Collection Time: 02/22/22  1:08 PM  Result Value Ref Range   Glucose-Capillary 112 (H) 70 - 99 mg/dL   Imaging:  No new  Assessment/Plan:  Hospital Problem List: Principal Problem:   Acute encephalopathy   Julie Mora is a 81 y.o. female with a PMHx of dementia, fecal incontinence, pemphigus foliaceous, T2DM, HTN who presented to the ED from [facility] home after the staff there were concerned for sepsis.  SIRS, source of infection unknown: Infectious work up negative thus far; UA with possible UTI, UCx pending. GIPP negative. CT suggesting stercoral colitis; is not experiencing nausea, vomiting, or abdominal pain. Diarrhea likely encopresis instead of GI bug. UTI remains a possibility; UCx has been re-incubated.   BID Enemas, senna, Miralax. If unsuccessful, manual disimpaction may be necessary. F/U Blood cultures >> NG  x 1 day  Daily CBC, BMP Ceftriaxone 1g qd, discontinue vanc and cefepime F/U Urine Cx Overflow Diarrhea  Stercoral colitis  BID Enemas, senna, Miralax. If unsuccessful,  manual disimpaction may be necessary. C. Diff PCR pending  Hypotension  tachycardia  Hypoalbuminemia: Likely intravascularly depleted and third spacing. Blood pressure increased over course of day.  500cc bolus @ 100 cc/hr Cautious IV fluid resuscitation for future episodes Bilateral breast intertrigo: nystatin powder may be ineffective given extent of disease, and candidiasis is another potential source for her current SIRS Weekly fluconazole Altered Mental Status: In the setting of baseline dementia. Suspect this is secondary to SIRS. BMP without severe electrolyte disturbances. CT head with no acute intracranial. Infectious work up as above Delirium precautions Macrocytic anemia  Hypoalbuminemia  Hypoproteinemia: Suggests problems with nourishment. TSH normal. Vitamin B12 normal. Her MCV was macrocytic during last admission as well.  RD consult   Chronic problems:  T2DM: SSI Dementia: holding donepezil and mirtazapine HTN: holding antihypertensives   Daily Safety Checklist: VTE: SQ Lovenox Diet: Dysphagia 3 IVF: 100 ml/hr NS Code status: DNR (order brought with patient from facility)  Hospital day:  LOS: 0 days  Vertell Novak, Medical Student 02/22/2022, 7:09 AM [phone]

## 2022-02-22 NOTE — Plan of Care (Signed)

## 2022-02-22 NOTE — Progress Notes (Signed)
OT Cancellation Note  Patient Details Name: Julie Mora MRN: [id number] DOB: [birth year]   Cancelled Treatment:    Reason Eval/Treat Not Completed: OT screened, no needs identified, will sign off. Pt is a resident of [facility] and dependent in ADLs and mobility at baseline. No OT needs. Recommend up with nursing and lift equipment.  [name] 02/22/2022, 9:31 AM Nestor Lewandowsky, OTR/L Acute Rehabilitation Services Pager: [phone] Office: [phone]

## 2022-02-22 NOTE — Progress Notes (Signed)
Initial Nutrition Assessment  DOCUMENTATION CODES:   Not applicable  INTERVENTION:   Multivitamin w/ minerals daily Ensure Enlive po BID, each supplement provides 350 kcal and 20 grams of protein. Feeding assist with all meals Meal ordering with assist  NUTRITION DIAGNOSIS:   Increased nutrient needs related to acute illness as evidenced by estimated needs.  GOAL:   Patient will meet greater than or equal to 90% of their needs  MONITOR:   PO intake, Supplement acceptance, Weight trends, Skin  REASON FOR ASSESSMENT:   Consult Assessment of nutrition requirement/status  ASSESSMENT:   81 y.o. female presented from SNF for possible sepsis; pt main concern is diarrhea. PMH includes T2DM, HTN, lymphedema of BLE, and colitis. Pt admitted with SIRS - unknown source of infection and altered mental status.    Per EMR, pt ate 50% of breakfast and lunch today. Pt reports that her lunch was good, but she has difficulty feeding her self and would need help.  Pt reports that her appetite has been good PTA and that she would eat almost everything served to her at her facility. Pt reports that they typically eat in a community meal area and she has someone help her eat.   Pt reports that she is unsure if she has had any weight loss due to no being weighed recently. Pt reports that she uses a wheel chair to ambulate at her facility.  Suspect that current weight is stated versus actual.   Discussed the use of ONS. Reports that she does not receive them at her facility but she wish she did. Discussed that RD can order due to not eating as well, pt agreeable.   Medications reviewed and include: SSI, Miralax, Senokot, IV antibiotics  Labs reviewed.  NUTRITION - FOCUSED PHYSICAL EXAM:  Flowsheet Row Most Recent Value  Orbital Region Moderate depletion  Upper Arm Region No depletion  Thoracic and Lumbar Region No depletion  Buccal Region No depletion  Temple Region No depletion  Clavicle  Bone Region Moderate depletion  Clavicle and Acromion Bone Region Moderate depletion  Scapular Bone Region Moderate depletion  Dorsal Hand Moderate depletion  Patellar Region Unable to assess  Anterior Thigh Region Unable to assess  Posterior Calf Region Unable to assess  [edema]  Edema (RD Assessment) Severe  Hair Reviewed  Eyes Reviewed  Mouth Reviewed  Skin Reviewed  Nails Reviewed       Diet Order:   Diet Order             DIET DYS 3 Room service appropriate? Yes; Fluid consistency: Thin  Diet effective now                   EDUCATION NEEDS:   No education needs have been identified at this time  Skin:  Skin Assessment: Reviewed RN Assessment  Last BM:  6/7  Height:   Ht Readings from Last 1 Encounters:  02/21/22 '5\' 5"'$  (1.651 m)    Weight:   Wt Readings from Last 1 Encounters:  02/21/22 108.9 kg    Ideal Body Weight:  56.8 kg  BMI:  Body mass index is 39.94 kg/m.  Estimated Nutritional Needs:   Kcal:  1900-2100  Protein:  95-110 grams  Fluid:  >/= 1.9 L    Hermina Barters RD, LDN Clinical Dietitian See [facility] for contact information.

## 2022-02-22 NOTE — Care Management Obs Status (Signed)
[place] NOTIFICATION   Patient Details  Name: Rosena Bartle MRN: [id number] Date of Birth: 01-08-1941   Medicare Observation Status Notification Given:       Cyndi Bender, RN 02/22/2022, 1:38 PM

## 2022-02-23 ENCOUNTER — Observation Stay (HOSPITAL_COMMUNITY): Payer: Medicare PPO

## 2022-02-23 DIAGNOSIS — K5289 Other specified noninfective gastroenteritis and colitis: Secondary | ICD-10-CM

## 2022-02-23 DIAGNOSIS — G9340 Encephalopathy, unspecified: Secondary | ICD-10-CM | POA: Diagnosis not present

## 2022-02-23 LAB — CBC
HCT: 31.6 % — ABNORMAL LOW (ref 36.0–46.0)
Hemoglobin: 10.2 g/dL — ABNORMAL LOW (ref 12.0–15.0)
MCH: 31.6 pg (ref 26.0–34.0)
MCHC: 32.3 g/dL (ref 30.0–36.0)
MCV: 97.8 fL (ref 80.0–100.0)
Platelets: 222 10*3/uL (ref 150–400)
RBC: 3.23 MIL/uL — ABNORMAL LOW (ref 3.87–5.11)
RDW: 14 % (ref 11.5–15.5)
WBC: 8 10*3/uL (ref 4.0–10.5)
nRBC: 0 % (ref 0.0–0.2)

## 2022-02-23 LAB — BASIC METABOLIC PANEL
Anion gap: 6 (ref 5–15)
BUN: 22 mg/dL (ref 8–23)
CO2: 25 mmol/L (ref 22–32)
Calcium: 7.7 mg/dL — ABNORMAL LOW (ref 8.9–10.3)
Chloride: 108 mmol/L (ref 98–111)
Creatinine, Ser: 1.11 mg/dL — ABNORMAL HIGH (ref 0.44–1.00)
GFR, Estimated: 50 mL/min — ABNORMAL LOW (ref >60)
Glucose, Bld: 107 mg/dL — ABNORMAL HIGH (ref 70–99)
Potassium: 3.6 mmol/L (ref 3.5–5.1)
Sodium: 139 mmol/L (ref 135–145)

## 2022-02-23 LAB — GLUCOSE, CAPILLARY
Glucose-Capillary: 109 mg/dL — ABNORMAL HIGH (ref 70–99)
Glucose-Capillary: 95 mg/dL (ref 70–99)
Glucose-Capillary: 105 mg/dL — ABNORMAL HIGH (ref 70–99)

## 2022-02-23 LAB — C DIFFICILE QUICK SCREEN W PCR REFLEX
C Diff antigen: POSITIVE — AB
C Diff toxin: NEGATIVE

## 2022-02-23 LAB — CLOSTRIDIUM DIFFICILE BY PCR, REFLEXED: Toxigenic C. Difficile by PCR: NEGATIVE

## 2022-02-23 MED ORDER — SENNOSIDES-DOCUSATE SODIUM 8.6-50 MG PO TABS: 2.0000 | ORAL_TABLET | Freq: Every day | ORAL | Status: DC

## 2022-02-23 MED ORDER — POLYETHYLENE GLYCOL 3350 17 G PO PACK: 17.0000 g | PACK | Freq: Every day | ORAL | Status: DC

## 2022-02-23 MED ORDER — SORBITOL 70 % SOLN
960.0000 mL | TOPICAL_OIL | Freq: Once | ORAL | Status: DC
  Filled 2022-02-23: qty 473

## 2022-02-23 MED ORDER — NYSTATIN 100000 UNIT/GM EX POWD: Freq: Two times a day (BID) | CUTANEOUS | 0 refills | Status: AC

## 2022-02-23 MED ORDER — NYSTATIN 100000 UNIT/GM EX POWD
Freq: Two times a day (BID) | CUTANEOUS | Status: DC
  Filled 2022-02-23: qty 15

## 2022-02-23 MED ORDER — FLUCONAZOLE 150 MG PO TABS
150.0000 mg | ORAL_TABLET | ORAL | 0 refills | Status: AC
Start: 2022-03-01 — End: 2022-03-23

## 2022-02-23 NOTE — Progress Notes (Shared)
Patient Summary Julie Mora is a 81 y.o. female with a PMHx of dementia, fecal incontinence, pemphigus foliaceous, T2DM, HTN who presented to the ED from [facility] after the staff there were concerned for sepsis.  Subjective:  Yesterday, refused to work with PT. Attempted to call sister Julie Mora, no answer.  Overnight, no acute events. She denied her morning medications.  On interview this morning, she says she is doing fine. She is not having any trouble breathing. She is having some belly pain. She states she called her doctor, "Dr. Catalina Mora", yesterday. She is not having any pain under her breasts.  Objective:  Vital signs in last 24 hours: Validated vitals: 1900 RR 19 Pulse 113 BP 113/80 SpO2 99%  2300 Temp 98.5 RR 27 Pulse 121 BP 120/51 SpO2 99%  0300 Temp 98.4 RR 11 Pulse 111 BP 102/66 SpO2 97%  On 2L [place]  Weight change:   Intake/Output Summary (Last 24 hours) at 02/23/2022 0626 Last data filed at 02/22/2022 1734 Gross per 24 hour  Intake 1087.99 ml  Output 1100 ml  Net -12.01 ml  2X stool  1X enema  MAR Review: 1X PRN tylenol  Physical Exam GEN: in no acute distress HEENT: pupils anicteric, extraoccular movements intact, mucous membranes moist CV:  tachycardia, irregular rhythm PULM: normal work of breathing ABD: bowel sounds present, non-tender to palpation EXT: warm to touch NEURO: face symmetric, no gross motor deficits of upper extremities PSY: alert and, oriented to self only, affect appropriate DERM: less erythema underneath breasts  Labs:    Latest Ref Rng & Units 02/22/2022    6:11 AM 02/21/2022    6:39 AM 01/14/2022    4:00 PM  BMP  Glucose 70 - 99 mg/dL 88  93  113   BUN 8 - 23 mg/dL '20  24  8   '$ Creatinine 0.44 - 1.00 mg/dL 0.98  1.10  0.72   Sodium 135 - 145 mmol/L 139  142  136   Potassium 3.5 - 5.1 mmol/L 3.9  4.3  4.3   Chloride 98 - 111 mmol/L 108  107  111   CO2 22 - 32 mmol/L '19  27  20   '$ Calcium 8.9 - 10.3 mg/dL 7.9  8.4   7.9       Latest Ref Rng & Units 02/22/2022    6:11 AM 02/21/2022    6:39 AM 01/14/2022    6:18 PM  CBC  WBC 4.0 - 10.5 K/uL 7.6  9.1  9.1   Hemoglobin 12.0 - 15.0 g/dL 11.1  11.2  12.2   Hematocrit 36.0 - 46.0 % 35.8  35.5  39.0   Platelets 150 - 400 K/uL 200  246  287    Blood cultures: negative  Urine culture: 10,000 colonies/mL E. Faecalis  Imaging:  No new  Assessment/Plan:  Hospital Problem List: Principal Problem:   Acute encephalopathy Active Problems:   Colitis   SIRS (systemic inflammatory response syndrome) ([place])   Julie Mora is a 81 y.o. female with a PMHx of dementia, fecal incontinence, pemphigus foliaceous, T2DM, HTN who presented to the ED from [facility] after the staff there were concerned for sepsis.  SIRS, source of infection unknown: work-up negative for infection thus far. Now normal temp and blood pressure. Without white count. Blood cultures negative for 2 days. Urine culture showing E. Faecalis, likely representing colonization with presence of isolate during last admission. D/C ceftriaxone Daily CBC, BMP tomorrow IV fluid resuscitation only if recurrent  of hypotension Tachycardia: Review of telemetry indicates tachycardia with premature narrow QRS complexes without P-waves. P-waves are present, suggesting it is not a-fib. Tachycardia may be secondary to frustration with hospitalization. Tele Overflow Diarrhea  Stercoral colitis: stooled twice yesterday, once today so far. KUB without significant stool burden.  Reduce bowel regiment to senna, Miralax daily. C. Diff PCR pending  Bilateral breast intertrigo: Improved on exam today Daily nystatin powder Weekly fluconazole Altered Mental Status: Unclear baseline. Has been alert but unoriented throughout admission.  Interview patient with sister Julie Mora to get idea of baseline Infectious work up as above Delirium precautions Macrocytic anemia  Hypoalbuminemia  Hypoproteinemia: Suggests  problems with nourishment. TSH normal. Vitamin B12 normal. Her MCV was macrocytic during last admission as well.  RD consult   Chronic problems:  T2DM: SSI. No insulin required Dementia: holding donepezil and mirtazapine HTN: no home antihypertensives in chart   Daily Safety Checklist: VTE: SQ Lovenox Diet: Dysphagia 3 IVF: none Code status: DNR (order brought with patient from facility)  Hospital day:  LOS: 0 days  Julie Mora, Medical Student 02/23/2022, 6:26 AM [phone]

## 2022-02-23 NOTE — Care Management Obs Status (Signed)
[place] NOTIFICATION   Patient Details  Name: Blu Mcglaun MRN: [id number] Date of Birth: Mar 27, 1941   Medicare Observation Status Notification Given:  Yes    Benard Halsted, LCSW 02/23/2022, 2:39 PM

## 2022-02-23 NOTE — TOC Initial Note (Signed)
Transition of Care [facility]) - Initial/Assessment Note    Patient Details  Name: Julie Mora MRN: [id number] Date of Birth: 11-18-1940  [facility] CM/SW Contact:    Benard Halsted, LCSW Phone Number: 02/23/2022, 2:53 PM  Clinical Narrative:                 Patient will DC to: [facility] Anticipated DC date: 02/23/22 Family notified: SisterJocelyn Lamer Transport by: Corey Harold   Per MD patient ready for DC to [facility]. RN to call report prior to discharge [phone]). RN, patient, patient's family, and facility notified of DC. Discharge Summary sent to facility. Patient is currently private pay at [facility]. DC packet on chart. Ambulance transport requested for patient.   CSW will sign off for now as social work intervention is no longer needed. Please consult [place] again if new needs arise.    Expected Discharge Plan: Skilled Nursing Facility Barriers to Discharge: Barriers Resolved   Patient Goals and CMS Choice Patient states their goals for this hospitalization and ongoing recovery are:: Return to SNF CMS Medicare.gov Compare Post Acute Care list provided to:: Patient Represenative (must comment) Choice offered to / list presented to : Sibling  Expected Discharge Plan and Services Expected Discharge Plan: [place] In-house Referral: Clinical Social Work   Post Acute Care Choice: [place] Living arrangements for the past 2 months: [place]                                      Prior Living Arrangements/Services Living arrangements for the past 2 months: [place] Lives with:: Facility Resident Patient language and need for interpreter reviewed:: Yes Do you feel safe going back to the place where you live?: Yes      Need for Family Participation in Patient Care: Yes (Comment) Care giver support system in place?: Yes (comment)   Criminal Activity/Legal Involvement Pertinent to Current  Situation/Hospitalization: No - Comment as needed  Activities of Daily Living Home Assistive Devices/Equipment: Environmental consultant (specify type) ADL Screening (condition at time of admission) Patient's cognitive ability adequate to safely complete daily activities?: No Is the patient deaf or have difficulty hearing?: No Does the patient have difficulty seeing, even when wearing glasses/contacts?: No Does the patient have difficulty concentrating, remembering, or making decisions?: Yes Patient able to express need for assistance with ADLs?: Yes Does the patient have difficulty dressing or bathing?: Yes Independently performs ADLs?: No Communication: Independent Dressing (OT): Needs assistance Is this a change from baseline?: Pre-admission baseline Grooming: Needs assistance Is this a change from baseline?: Pre-admission baseline Feeding: Needs assistance Is this a change from baseline?: Pre-admission baseline Bathing: Needs assistance Toileting: Needs assistance In/Out Bed: Needs assistance Is this a change from baseline?: Pre-admission baseline Walks in Home: Needs assistance Is this a change from baseline?: Pre-admission baseline Does the patient have difficulty walking or climbing stairs?: Yes Weakness of Legs: Both Weakness of Arms/Hands: Both  Permission Sought/Granted Permission sought to share information with : Facility Sport and exercise psychologist, Family Supports Permission granted to share information with : Yes, Verbal Permission Granted  Share Information with NAME: [name]  Permission granted to share info w AGENCY: [facility]  Permission granted to share info w Relationship: Sister  Permission granted to share info w Contact Information: [phone]  Emotional Assessment Appearance:: Appears stated age Attitude/Demeanor/Rapport: Gracious Affect (typically observed): Accepting, Appropriate Orientation: :  (  disoriented x4) Alcohol / Substance Use: Not Applicable Psych Involvement: No  (comment)  Admission diagnosis:  Colitis [K52.9] SIRS (systemic inflammatory response syndrome) (HCC) [R65.10] Acute encephalopathy [G93.40] Altered mental status, unspecified altered mental status type [R41.82] Patient Active Problem List   Diagnosis Date Noted   Colitis    SIRS (systemic inflammatory response syndrome) (HCC)    Acute encephalopathy 02/21/2022   Pressure injury of skin 01/09/2022   Hypotension 01/08/2022   Wound of lower extremity 01/08/2022   AKI (acute kidney injury) ([place]) 01/08/2022   Yeast infection of the skin 22/63/3354   Acute metabolic encephalopathy 56/25/6389   Pneumonia 05/22/2020   Cellulitis 05/22/2020   Fall at home, initial encounter 05/22/2020   Paronychia of right middle finger 10/23/2018   Primary osteoarthritis of both first carpometacarpal joints 10/23/2018   Spasms of the hands or feet 10/23/2018   Pancreatic cyst 06/07/2017   UTI (urinary tract infection) 06/07/2017   Morbid obesity ([place]) 03/02/2017   Poorly controlled diabetes mellitus ([place]) 03/02/2017   Stasis dermatitis of both legs 03/02/2017   Sepsis ([place]) 01/19/2017   Lymphedema of both lower extremities 06/21/2015   Venous insufficiency (chronic) (peripheral) 06/21/2015   Osteoporosis 12/15/2014   PCO (posterior capsular opacification) 10/15/2013   Keratitis 08/20/2013   Corneal scarring 37/34/2876   Metabolic syndrome 81/15/7262   History of stroke 05/03/2012   Ectropion 10/10/2011   Neurotrophic keratoconjunctivitis of both eyes 10/10/2011   Pseudophakia 10/10/2011   History of deep venous thrombosis 04/19/2011   Vitamin D deficiency 04/19/2011   History of iron deficiency 04/19/2011   Dependent edema 04/19/2011   Mixed stress and urge urinary incontinence 04/19/2011   CEREBROVASCULAR ACCIDENT, ACUTE 06/25/2009   PEMPHIGUS FOLIACEOUS 06/25/2009   Diabetes mellitus ([place]) 05/28/2009   HYPERCHOLESTEROLEMIA 05/28/2009   Essential hypertension 05/28/2009   PCP:  System,  Provider Not In Pharmacy:  No Pharmacies Listed    Social Determinants of Health (SDOH) Interventions    Readmission Risk Interventions     No data to display

## 2022-02-23 NOTE — Discharge Summary (Addendum)
Name: Julie Mora MRN: [id number] DOB: 1941/04/20 81 y.o. PCP: System, Provider Not In  Date of Admission: 02/21/2022  5:59 AM Date of Discharge: 02/23/2022 Attending Physician: Velna Ochs, MD  Discharge Diagnosis: Sepsis ruled out Acute encephalopathy Stercoral colitis with constipation Overflow diarrhea Intertrigo of bilateral breast Tachycardia Advanced dementia  Discharge Medications: Allergies as of 02/23/2022       Reactions   Azathioprine Shortness Of Breath, Other (See Comments)   severly allergic. nausea, diarrhea, low blood pressure, fever, chills, rash. was hospitalized due to side effects in feb 2012. Other reaction(s): sob, nausea   Mycophenolate Mofetil Nausea Only   severe nausea Other reaction(s): Unknown   Ramipril Cough        Medication List     TAKE these medications    acetaminophen 325 MG tablet Commonly known as: TYLENOL Take 2 tablets (650 mg total) by mouth every 6 (six) hours as needed for mild pain, fever or headache.   aspirin 81 MG chewable tablet Chew 81 mg by mouth daily.   Dermacloud Oint Apply 1 application. topically in the morning and at bedtime. Buttocks and inner thighs   donepezil 10 MG tablet Commonly known as: ARICEPT Take 10 mg by mouth daily.   ferrous gluconate 324 MG tablet Commonly known as: FERGON TAKE 1 TABLET(324 MG) BY MOUTH DAILY WITH BREAKFAST What changed:  how much to take how to take this when to take this additional instructions   fluconazole 150 MG tablet Commonly known as: DIFLUCAN Take 1 tablet (150 mg total) by mouth once a week for 4 doses. Start taking on: March 01, 2022 What changed:  when to take this additional instructions   gabapentin 100 MG capsule Commonly known as: NEURONTIN Take 100 mg by mouth at bedtime.   magnesium gluconate 500 MG tablet Commonly known as: MAGONATE Take 500 mg by mouth daily.   memantine 5 MG tablet Commonly known as: NAMENDA Take 5 mg by mouth  2 (two) times daily.   mirtazapine 15 MG tablet Commonly known as: REMERON Take 0.5 tablets (7.5 mg total) by mouth at bedtime.   neomycin-polymyxin-dexameth 0.1 % Oint Commonly known as: MAXITROL Place 1 application. into both eyes 4 (four) times daily.   NovoLOG FlexPen 100 UNIT/ML FlexPen Generic drug: insulin aspart Inject 0-15 Units into the skin 3 (three) times daily with meals. Per Sliding Scale: 70-120 0units 121-150 2units 151-200 3units 201-250 5units 251-300 8units 301-350 11units 351-400 15units 401 or greater, call 911.   nystatin powder Commonly known as: MYCOSTATIN/NYSTOP Apply topically 2 (two) times daily.   polyethylene glycol 17 g packet Commonly known as: MIRALAX / GLYCOLAX Take 17 g by mouth daily. Hold for loose stool.   senna-docusate 8.6-50 MG tablet Commonly known as: Senokot-S Take 1 tablet by mouth 2 (two) times daily. Hold for loose stools.   simvastatin 20 MG tablet Commonly known as: ZOCOR Take 1 tablet (20 mg total) by mouth at bedtime.   Systane 0.4-0.3 % Soln Generic drug: Polyethyl Glycol-Propyl Glycol Apply 1 drop to eye in the morning, at noon, in the evening, and at bedtime. Dry eyes.        Disposition and follow-up:   [name] was [facility] from [facility] in Stable condition.  At the hospital follow up visit please address:  Her bowel movements. Please ensure she is having regular, soft stools. If not, please adjust her bowel regiment.  The intertrigo beneath her breasts. Please ensure that she has been  receiving her weekly fluconazole and daily nystatin powder.  Her tachycardia. Please obtain a 12-lead ECG.  This could be due to hypovolemia.  Please ensure adequate p.o. intake.  Her nutrition. She has a macrocytic anemia as well as hypoalbuminemia.   Labs / imaging needed at time of follow-up: CBC, CMP  Pending labs/ test needing follow-up: C. diff  Follow-up Appointments: None  scheduled  Hospital Course by problem list: 1. SIRS, sepsis rule-out: Presented hypotensive, hypothermic, and tachycardic, as well as increased confusion. Patient reported increased diarrhea. WBC normal on admission. Blood culture and urine culture obtained. Fluid resuscitation with LR, and vancomycin and cefepime were initiated. Her blood pressure normalized, and her hypothermia resolved. Antibiotics were narrowed to IV ceftriaxone for possible UTI. But urine culture grew 10 k Enterococcus faecalis consistent with prior culture, likely colonization.  GI pathogen panel returned negative. Blood cultures showed no growth at 48 hours. She was asymptomatic of dysuria. At time of discharge, she was afebrile and hemodynamically stable. Antibiotics were discontinued.   2. Overflow Diarrhea, Stercoral Colitis: CTAP showed stercoral colitis with moderate stool burden in the rectum on admission. She received miralax, senna, and a SMOG enema, after which she had three bowel movements.  Repeat KUB showed improvement of stool burden.  On day discharge, patient and nursing staff reported no further episodes of diarrhea.  3. Intertrigo: Extensive erythematous patches bilaterally underneath both breasts. Patient  was started on oral fluconazole given degree of skin involvement and treatment with antibiotics. Rash improved significantly after the first dose.  Also given nystatin powder. She was discharged with weekly fluconazole to complete a 4 week course.   4. Tachycardia: patient was intermittently, irregularly tachycardic during admission. Telemetry monitoring showed predominance of sinus arrhythmia with some PVCs. She denied chest pain and dyspnea throughout her hospitalization.  Suspect this could have been secondary to hypovolemia.  Discharge Exam:   BP (!) 103/54 (BP Location: Right Arm)   Pulse (!) 105   Temp 98 F (36.7 C) (Oral)   Resp 19   Ht '5\' 5"'$  (1.651 m)   Wt 108.9 kg   LMP  (LMP Unknown)   SpO2  98%   BMI 39.94 kg/m  Discharge exam:  GEN: in no acute distress HEENT: pupils anicteric, extraoccular movements intact, mucous membranes moist CV:  tachycardia, irregular rhythm PULM: normal work of breathing ABD: bowel sounds present, non-tender to palpation EXT: warm to touch NEURO: face symmetric, no gross motor deficits of upper extremities PSY: alert and, oriented to self only, affect appropriate DERM: less erythema underneath breasts  Pertinent Labs, Studies, and Procedures:     Latest Ref Rng & Units 02/23/2022    7:54 AM 02/22/2022    6:11 AM 02/21/2022    6:39 AM  CBC  WBC 4.0 - 10.5 K/uL 8.0  7.6  9.1   Hemoglobin 12.0 - 15.0 g/dL 10.2  11.1  11.2   Hematocrit 36.0 - 46.0 % 31.6  35.8  35.5   Platelets 150 - 400 K/uL 222  200  246       Latest Ref Rng & Units 02/23/2022    7:54 AM 02/22/2022    6:11 AM 02/21/2022    6:39 AM  BMP  Glucose 70 - 99 mg/dL 107  88  93   BUN 8 - 23 mg/dL '22  20  24   '$ Creatinine 0.44 - 1.00 mg/dL 1.11  0.98  1.10   Sodium 135 - 145 mmol/L 139  139  142  Potassium 3.5 - 5.1 mmol/L 3.6  3.9  4.3   Chloride 98 - 111 mmol/L 108  108  107   CO2 22 - 32 mmol/L '25  19  27   '$ Calcium 8.9 - 10.3 mg/dL 7.7  7.9  8.4     DG Abd Portable 1V  Result Date: 02/23/2022 CLINICAL DATA:  Diarrhea.  Moderate leukocytes in urine. EXAM: PORTABLE ABDOMEN - 1 VIEW COMPARISON:  02/21/2022 FINDINGS: Unremarkable bowel gas pattern.  Mild bony demineralization. IMPRESSION: 1. Unremarkable bowel gas pattern. Electronically Signed   By: Van Clines M.D.   On: 02/23/2022 10:24   CT ABDOMEN PELVIS W CONTRAST  Result Date: 02/21/2022 CLINICAL DATA:  Diffuse abdominal pain. EXAM: CT ABDOMEN AND PELVIS WITH CONTRAST TECHNIQUE: Multidetector CT imaging of the abdomen and pelvis was performed using the standard protocol following bolus administration of intravenous contrast. RADIATION DOSE REDUCTION: This exam was performed according to the departmental dose-optimization  program which includes automated exposure control, adjustment of the mA and/or kV according to patient size and/or use of iterative reconstruction technique. CONTRAST:  157m OMNIPAQUE IOHEXOL 300 MG/ML  SOLN COMPARISON:  MRI abdomen from 07/01/2018 and CT AP from 06/07/2017. FINDINGS: Lower chest: Trace left pleural effusion and small right pleural effusion noted. Hepatobiliary: No focal liver abnormality. Cholecystectomy. No bile duct dilatation. Pancreas: Unremarkable. No pancreatic ductal dilatation or surrounding inflammatory changes. Spleen: Normal in size without focal abnormality. Adrenals/Urinary Tract: Previously characterized Bosniak class 2 F lesion within the inferior pole of the left kidney measures 4.4 by 2.8 cm, image 42/3. This measures 18 Hounsfield units on today's study. On the previous exam this mass was measured 5.1 x 3.7 cm right-sided pelvocaliectasis is identified. No obstructing stone identified along the course of the right ureter. 2.2 cm diverticula arises off the anterior wall of the urinary bladder. Bladder is otherwise unremarkable. Stomach/Bowel: Stomach is normal. The appendix is not confidently identified. No signs of pericecal inflammation. Moderate retained stool identified within the rectum. There is mild wall thickening involving the rectum which may reflect underlying stercoral colitis. The remaining bowel loops are unremarkable without signs of wall thickening, inflammation or distension. Vascular/Lymphatic: Aortic atherosclerosis without aneurysm. No signs of abdominopelvic adenopathy. Reproductive: Uterus and bilateral adnexa are unremarkable. Other: No free fluid or fluid collections. Fat containing umbilical hernia is identified measuring 5.6 x 2.6 by 7.9 cm. Musculoskeletal: No acute or significant osseous findings. Multilevel degenerative disc disease identified within the lower thoracic and lumbar spine. No acute or suspicious osseous findings. IMPRESSION: 1. Moderate  retained stool identified within the rectum with mild wall thickening which may reflect underlying stercoral colitis. 2. Mild right-sided pelvocaliectasis. No obstructing stone identified. 3. Previously characterized Bosniak class 61F lesion within the inferior pole of left kidney is mildly decreased in size from previous exam. 4. Fat containing umbilical hernia. 5. Trace left pleural effusion and small right pleural effusion. 6. Aortic Atherosclerosis (ICD10-I70.0). Electronically Signed   By: TKerby MoorsM.D.   On: 02/21/2022 11:28   CT Head Wo Contrast  Result Date: 02/21/2022 CLINICAL DATA:  Delirium. EXAM: CT HEAD WITHOUT CONTRAST TECHNIQUE: Contiguous axial images were obtained from the base of the skull through the vertex without intravenous contrast. RADIATION DOSE REDUCTION: This exam was performed according to the departmental dose-optimization program which includes automated exposure control, adjustment of the mA and/or kV according to patient size and/or use of iterative reconstruction technique. COMPARISON:  Head CT 01/08/2022 FINDINGS: Brain: There is no evidence of an acute  infarct, intracranial hemorrhage, mass, midline shift, or extra-axial fluid collection. Chronic infarcts are again noted in the right frontal lobe, left parieto-occipital region, and cerebellum. Hypodensities elsewhere in the cerebral white matter bilaterally are unchanged and nonspecific but compatible with mild chronic small vessel ischemic disease. There is mild cerebral atrophy. Vascular: Calcified atherosclerosis at the skull base. No hyperdense vessel. Skull: No fracture or suspicious osseous lesion. Sinuses/Orbits: Chronic left sphenoid sinusitis with near complete opacification of the sinus. Clear mastoid air cells. Bilateral cataract extraction. Other: None. IMPRESSION: 1. No evidence of acute intracranial abnormality. 2. Chronic ischemia with multiple old infarcts. Electronically Signed   By: Logan Bores M.D.   On:  02/21/2022 11:14   DG Chest Port 1 View  Result Date: 02/21/2022 CLINICAL DATA:  Sepsis. EXAM: PORTABLE CHEST 1 VIEW COMPARISON:  01/08/2022 FINDINGS: Stable cardiomediastinal contours. Lung volumes are low and there is rotational artifact. Chronic interstitial coarsening is noted bilaterally. No superimposed pleural effusion, interstitial edema or airspace disease. IMPRESSION: 1. Low lung volumes. 2. Chronic interstitial coarsening. Electronically Signed   By: Kerby Moors M.D.   On: 02/21/2022 06:30     Discharge Instructions: Discharge Instructions     Call MD for:  persistant nausea and vomiting   Complete by: As directed    Call MD for:  severe uncontrolled pain   Complete by: As directed    Diet - low sodium heart healthy   Complete by: As directed    Discharge instructions   Complete by: As directed    [name],  It was a pleasure taking care of you during this admission.  You were hospitalized for change in your mental status.  We have ruled out infection.  Your blood culture was negative.  Urine culture was unremarkable.  You have received 3 days of IV antibiotic.  We also noticed that you had been constipated.  This has resolved with laxative.  You will be discharged back to [facility].  Take care,   Dr. Alfonse Spruce   Increase activity slowly   Complete by: As directed        Signed: Gaylan Gerold, DO 02/23/2022, 3:57 PM   Pager: [phone]

## 2022-02-23 NOTE — Progress Notes (Signed)
Pt refused EKG reported to RN Thedora Hinders.

## 2022-02-23 NOTE — Plan of Care (Signed)

## 2022-02-23 NOTE — Care Management CC44 (Signed)
Condition Code 44 Documentation Completed  Patient Details  Name: Julie Mora MRN: [id number] Date of Birth: February 23, 1941   Condition Code 44 given:  Yes Patient signature on Condition Code 44 notice:  Yes Documentation of 2 MD's agreement:  Yes Code 44 added to claim:  Yes    Benard Halsted, LCSW 02/23/2022, 2:40 PM

## 2022-02-25 LAB — URINE CULTURE: Culture: 10000 — AB

## 2022-02-26 LAB — CULTURE, BLOOD (ROUTINE X 2)
Culture: NO GROWTH
Culture: NO GROWTH
Special Requests: ADEQUATE
Special Requests: ADEQUATE

## 2023-09-19 DEATH — deceased
